# Patient Record
Sex: Female | Born: 1935 | Race: White | Hispanic: No | Smoking: Never smoker
Health system: Southern US, Community
[De-identification: ages and names within clinical notes are randomized; demographics above are authoritative.]

## PROBLEM LIST (undated history)

## (undated) DIAGNOSIS — R001 Bradycardia, unspecified: Secondary | ICD-10-CM

## (undated) DIAGNOSIS — Z7901 Long term (current) use of anticoagulants: Secondary | ICD-10-CM

## (undated) DIAGNOSIS — T50905A Adverse effect of unspecified drugs, medicaments and biological substances, initial encounter: Secondary | ICD-10-CM

## (undated) DIAGNOSIS — F329 Major depressive disorder, single episode, unspecified: Secondary | ICD-10-CM

## (undated) DIAGNOSIS — B690 Cysticercosis of central nervous system: Secondary | ICD-10-CM

## (undated) DIAGNOSIS — I119 Hypertensive heart disease without heart failure: Secondary | ICD-10-CM

## (undated) DIAGNOSIS — R634 Abnormal weight loss: Secondary | ICD-10-CM

## (undated) DIAGNOSIS — F419 Anxiety disorder, unspecified: Secondary | ICD-10-CM

## (undated) DIAGNOSIS — R5383 Other fatigue: Secondary | ICD-10-CM

## (undated) DIAGNOSIS — I672 Cerebral atherosclerosis: Secondary | ICD-10-CM

## (undated) DIAGNOSIS — G459 Transient cerebral ischemic attack, unspecified: Secondary | ICD-10-CM

## (undated) DIAGNOSIS — F22 Delusional disorders: Secondary | ICD-10-CM

## (undated) DIAGNOSIS — R441 Visual hallucinations: Secondary | ICD-10-CM

## (undated) DIAGNOSIS — K5792 Diverticulitis of intestine, part unspecified, without perforation or abscess without bleeding: Secondary | ICD-10-CM

## (undated) DIAGNOSIS — R5381 Other malaise: Secondary | ICD-10-CM

## (undated) DIAGNOSIS — H81399 Other peripheral vertigo, unspecified ear: Secondary | ICD-10-CM

## (undated) DIAGNOSIS — N951 Menopausal and female climacteric states: Secondary | ICD-10-CM

## (undated) DIAGNOSIS — M549 Dorsalgia, unspecified: Secondary | ICD-10-CM

## (undated) DIAGNOSIS — I480 Paroxysmal atrial fibrillation: Secondary | ICD-10-CM

## (undated) DIAGNOSIS — M810 Age-related osteoporosis without current pathological fracture: Secondary | ICD-10-CM

## (undated) DIAGNOSIS — N390 Urinary tract infection, site not specified: Secondary | ICD-10-CM

## (undated) DIAGNOSIS — N952 Postmenopausal atrophic vaginitis: Secondary | ICD-10-CM

## (undated) DIAGNOSIS — M1990 Unspecified osteoarthritis, unspecified site: Secondary | ICD-10-CM

## (undated) DIAGNOSIS — H3530 Unspecified macular degeneration: Secondary | ICD-10-CM

## (undated) DIAGNOSIS — K644 Residual hemorrhoidal skin tags: Secondary | ICD-10-CM

## (undated) DIAGNOSIS — I509 Heart failure, unspecified: Secondary | ICD-10-CM

## (undated) DIAGNOSIS — R0789 Other chest pain: Secondary | ICD-10-CM

## (undated) DIAGNOSIS — K219 Gastro-esophageal reflux disease without esophagitis: Secondary | ICD-10-CM

## (undated) DIAGNOSIS — F0390 Unspecified dementia without behavioral disturbance: Secondary | ICD-10-CM

## (undated) DIAGNOSIS — R32 Unspecified urinary incontinence: Secondary | ICD-10-CM

## (undated) HISTORY — DX: Unspecified urinary incontinence: R32

## (undated) HISTORY — DX: Urinary tract infection, site not specified: N39.0

## (undated) HISTORY — DX: Residual hemorrhoidal skin tags: K64.4

## (undated) HISTORY — DX: Delusional disorders: F22

## (undated) HISTORY — DX: Other peripheral vertigo, unspecified ear: H81.399

## (undated) HISTORY — DX: Other fatigue: R53.83

## (undated) HISTORY — DX: Abnormal weight loss: R63.4

## (undated) HISTORY — DX: Anxiety disorder, unspecified: F41.9

## (undated) HISTORY — PX: JOINT REPLACEMENT: SHX530

## (undated) HISTORY — DX: Other malaise: R53.81

## (undated) HISTORY — PX: CATARACT EXTRACTION, BILATERAL: SHX1313

## (undated) HISTORY — DX: Dorsalgia, unspecified: M54.9

## (undated) HISTORY — DX: Other chest pain: R07.89

## (undated) HISTORY — DX: Menopausal and female climacteric states: N95.1

## (undated) HISTORY — DX: Postmenopausal atrophic vaginitis: N95.2

## (undated) HISTORY — PX: SHOULDER ARTHROSCOPY W/ ROTATOR CUFF REPAIR: SHX2400

## (undated) HISTORY — DX: Age-related osteoporosis without current pathological fracture: M81.0

## (undated) HISTORY — DX: Cerebral atherosclerosis: I67.2

## (undated) HISTORY — DX: Unspecified osteoarthritis, unspecified site: M19.90

## (undated) HISTORY — DX: Bradycardia, unspecified: R00.1

## (undated) HISTORY — DX: Adverse effect of unspecified drugs, medicaments and biological substances, initial encounter: T50.905A

## (undated) HISTORY — DX: Major depressive disorder, single episode, unspecified: F32.9

---

## 1958-12-09 HISTORY — PX: VAGINAL HYSTERECTOMY: SUR661

## 1988-12-09 HISTORY — PX: TOTAL KNEE ARTHROPLASTY: SHX125

## 1998-12-09 HISTORY — PX: BRAIN SURGERY: SHX531

## 1999-09-24 DIAGNOSIS — I672 Cerebral atherosclerosis: Secondary | ICD-10-CM

## 1999-09-24 HISTORY — DX: Cerebral atherosclerosis: I67.2

## 2000-10-07 DIAGNOSIS — H81399 Other peripheral vertigo, unspecified ear: Secondary | ICD-10-CM

## 2000-10-07 HISTORY — DX: Other peripheral vertigo, unspecified ear: H81.399

## 2002-09-06 DIAGNOSIS — F32A Depression, unspecified: Secondary | ICD-10-CM

## 2002-09-06 HISTORY — DX: Depression, unspecified: F32.A

## 2003-01-17 DIAGNOSIS — F419 Anxiety disorder, unspecified: Secondary | ICD-10-CM

## 2003-01-17 HISTORY — DX: Anxiety disorder, unspecified: F41.9

## 2004-05-28 DIAGNOSIS — R634 Abnormal weight loss: Secondary | ICD-10-CM

## 2004-05-28 DIAGNOSIS — M810 Age-related osteoporosis without current pathological fracture: Secondary | ICD-10-CM

## 2004-05-28 HISTORY — DX: Age-related osteoporosis without current pathological fracture: M81.0

## 2004-05-28 HISTORY — DX: Abnormal weight loss: R63.4

## 2005-08-19 DIAGNOSIS — M549 Dorsalgia, unspecified: Secondary | ICD-10-CM

## 2005-08-19 HISTORY — DX: Dorsalgia, unspecified: M54.9

## 2007-08-31 ENCOUNTER — Encounter: Admission: RE | Admit: 2007-08-31 | Discharge: 2007-08-31 | Payer: Self-pay | Admitting: Neurosurgery

## 2007-09-28 DIAGNOSIS — K5792 Diverticulitis of intestine, part unspecified, without perforation or abscess without bleeding: Secondary | ICD-10-CM

## 2007-09-28 HISTORY — DX: Diverticulitis of intestine, part unspecified, without perforation or abscess without bleeding: K57.92

## 2009-04-12 DIAGNOSIS — K644 Residual hemorrhoidal skin tags: Secondary | ICD-10-CM

## 2009-04-12 HISTORY — DX: Residual hemorrhoidal skin tags: K64.4

## 2011-12-10 HISTORY — PX: CATARACT EXTRACTION: SUR2

## 2013-09-21 LAB — LIPID PANEL
Cholesterol: 150 mg/dL (ref 0–200)
HDL: 45 mg/dL (ref 35–70)
LDL Cholesterol: 44 mg/dL
TRIGLYCERIDES: 303 mg/dL — AB (ref 40–160)

## 2013-12-15 DIAGNOSIS — I10 Essential (primary) hypertension: Secondary | ICD-10-CM | POA: Diagnosis not present

## 2013-12-15 DIAGNOSIS — E7800 Pure hypercholesterolemia, unspecified: Secondary | ICD-10-CM | POA: Diagnosis not present

## 2013-12-15 DIAGNOSIS — I4891 Unspecified atrial fibrillation: Secondary | ICD-10-CM | POA: Diagnosis not present

## 2013-12-15 DIAGNOSIS — E785 Hyperlipidemia, unspecified: Secondary | ICD-10-CM | POA: Diagnosis not present

## 2013-12-29 DIAGNOSIS — Z7901 Long term (current) use of anticoagulants: Secondary | ICD-10-CM | POA: Diagnosis not present

## 2013-12-29 DIAGNOSIS — I4891 Unspecified atrial fibrillation: Secondary | ICD-10-CM | POA: Diagnosis not present

## 2013-12-29 DIAGNOSIS — I10 Essential (primary) hypertension: Secondary | ICD-10-CM | POA: Diagnosis not present

## 2014-01-10 DIAGNOSIS — H353190 Nonexudative age-related macular degeneration, unspecified eye, stage unspecified: Secondary | ICD-10-CM | POA: Diagnosis not present

## 2014-03-15 DIAGNOSIS — M159 Polyosteoarthritis, unspecified: Secondary | ICD-10-CM | POA: Diagnosis not present

## 2014-03-15 DIAGNOSIS — I4891 Unspecified atrial fibrillation: Secondary | ICD-10-CM | POA: Diagnosis not present

## 2014-03-15 DIAGNOSIS — Z7901 Long term (current) use of anticoagulants: Secondary | ICD-10-CM | POA: Diagnosis not present

## 2014-03-15 DIAGNOSIS — I10 Essential (primary) hypertension: Secondary | ICD-10-CM | POA: Diagnosis not present

## 2014-03-16 DIAGNOSIS — H10439 Chronic follicular conjunctivitis, unspecified eye: Secondary | ICD-10-CM | POA: Diagnosis not present

## 2014-03-16 DIAGNOSIS — H5200 Hypermetropia, unspecified eye: Secondary | ICD-10-CM | POA: Diagnosis not present

## 2014-03-16 DIAGNOSIS — Z961 Presence of intraocular lens: Secondary | ICD-10-CM | POA: Diagnosis not present

## 2014-03-16 DIAGNOSIS — H353190 Nonexudative age-related macular degeneration, unspecified eye, stage unspecified: Secondary | ICD-10-CM | POA: Diagnosis not present

## 2014-04-04 DIAGNOSIS — Z7901 Long term (current) use of anticoagulants: Secondary | ICD-10-CM | POA: Diagnosis not present

## 2014-04-04 DIAGNOSIS — I471 Supraventricular tachycardia: Secondary | ICD-10-CM | POA: Diagnosis not present

## 2014-04-04 DIAGNOSIS — H539 Unspecified visual disturbance: Secondary | ICD-10-CM | POA: Diagnosis not present

## 2014-04-04 DIAGNOSIS — Z79899 Other long term (current) drug therapy: Secondary | ICD-10-CM | POA: Diagnosis not present

## 2014-04-05 DIAGNOSIS — H3530 Unspecified macular degeneration: Secondary | ICD-10-CM | POA: Diagnosis not present

## 2014-04-05 DIAGNOSIS — Z79899 Other long term (current) drug therapy: Secondary | ICD-10-CM | POA: Diagnosis not present

## 2014-04-05 DIAGNOSIS — I471 Supraventricular tachycardia: Secondary | ICD-10-CM | POA: Diagnosis not present

## 2014-04-05 DIAGNOSIS — Z7901 Long term (current) use of anticoagulants: Secondary | ICD-10-CM | POA: Diagnosis not present

## 2014-04-05 DIAGNOSIS — H539 Unspecified visual disturbance: Secondary | ICD-10-CM | POA: Diagnosis not present

## 2014-04-06 DIAGNOSIS — Z961 Presence of intraocular lens: Secondary | ICD-10-CM | POA: Diagnosis not present

## 2014-04-06 DIAGNOSIS — H353190 Nonexudative age-related macular degeneration, unspecified eye, stage unspecified: Secondary | ICD-10-CM | POA: Diagnosis not present

## 2014-04-06 DIAGNOSIS — H353290 Exudative age-related macular degeneration, unspecified eye, stage unspecified: Secondary | ICD-10-CM | POA: Diagnosis not present

## 2014-04-18 DIAGNOSIS — I10 Essential (primary) hypertension: Secondary | ICD-10-CM | POA: Diagnosis not present

## 2014-04-18 DIAGNOSIS — Z9889 Other specified postprocedural states: Secondary | ICD-10-CM | POA: Diagnosis not present

## 2014-04-18 DIAGNOSIS — H5316 Psychophysical visual disturbances: Secondary | ICD-10-CM | POA: Diagnosis not present

## 2014-04-18 DIAGNOSIS — G459 Transient cerebral ischemic attack, unspecified: Secondary | ICD-10-CM | POA: Diagnosis not present

## 2014-04-22 DIAGNOSIS — H5316 Psychophysical visual disturbances: Secondary | ICD-10-CM | POA: Diagnosis not present

## 2014-04-22 DIAGNOSIS — G459 Transient cerebral ischemic attack, unspecified: Secondary | ICD-10-CM | POA: Diagnosis not present

## 2014-04-22 DIAGNOSIS — R443 Hallucinations, unspecified: Secondary | ICD-10-CM | POA: Diagnosis not present

## 2014-05-09 DIAGNOSIS — H5316 Psychophysical visual disturbances: Secondary | ICD-10-CM | POA: Diagnosis not present

## 2014-05-23 DIAGNOSIS — Z7901 Long term (current) use of anticoagulants: Secondary | ICD-10-CM | POA: Diagnosis not present

## 2014-05-23 DIAGNOSIS — H5316 Psychophysical visual disturbances: Secondary | ICD-10-CM | POA: Diagnosis not present

## 2014-06-07 DIAGNOSIS — H5316 Psychophysical visual disturbances: Secondary | ICD-10-CM | POA: Diagnosis not present

## 2014-06-07 DIAGNOSIS — G40209 Localization-related (focal) (partial) symptomatic epilepsy and epileptic syndromes with complex partial seizures, not intractable, without status epilepticus: Secondary | ICD-10-CM | POA: Diagnosis not present

## 2014-06-07 DIAGNOSIS — I10 Essential (primary) hypertension: Secondary | ICD-10-CM | POA: Diagnosis not present

## 2014-06-28 DIAGNOSIS — G40219 Localization-related (focal) (partial) symptomatic epilepsy and epileptic syndromes with complex partial seizures, intractable, without status epilepticus: Secondary | ICD-10-CM | POA: Diagnosis not present

## 2014-06-29 DIAGNOSIS — H353290 Exudative age-related macular degeneration, unspecified eye, stage unspecified: Secondary | ICD-10-CM | POA: Diagnosis not present

## 2014-07-12 DIAGNOSIS — H353190 Nonexudative age-related macular degeneration, unspecified eye, stage unspecified: Secondary | ICD-10-CM | POA: Diagnosis not present

## 2014-07-12 DIAGNOSIS — H5200 Hypermetropia, unspecified eye: Secondary | ICD-10-CM | POA: Diagnosis not present

## 2014-07-12 DIAGNOSIS — H10439 Chronic follicular conjunctivitis, unspecified eye: Secondary | ICD-10-CM | POA: Diagnosis not present

## 2014-07-12 DIAGNOSIS — Z961 Presence of intraocular lens: Secondary | ICD-10-CM | POA: Diagnosis not present

## 2014-07-15 DIAGNOSIS — Z79899 Other long term (current) drug therapy: Secondary | ICD-10-CM | POA: Diagnosis not present

## 2014-07-15 DIAGNOSIS — H5316 Psychophysical visual disturbances: Secondary | ICD-10-CM | POA: Diagnosis not present

## 2014-08-10 DIAGNOSIS — H5316 Psychophysical visual disturbances: Secondary | ICD-10-CM | POA: Diagnosis not present

## 2014-08-10 DIAGNOSIS — G40219 Localization-related (focal) (partial) symptomatic epilepsy and epileptic syndromes with complex partial seizures, intractable, without status epilepticus: Secondary | ICD-10-CM | POA: Diagnosis not present

## 2014-08-29 DIAGNOSIS — H353190 Nonexudative age-related macular degeneration, unspecified eye, stage unspecified: Secondary | ICD-10-CM | POA: Diagnosis not present

## 2014-09-21 DIAGNOSIS — R441 Visual hallucinations: Secondary | ICD-10-CM | POA: Diagnosis not present

## 2014-09-23 DIAGNOSIS — Z23 Encounter for immunization: Secondary | ICD-10-CM | POA: Diagnosis not present

## 2014-10-05 DIAGNOSIS — Z961 Presence of intraocular lens: Secondary | ICD-10-CM | POA: Diagnosis not present

## 2014-10-05 DIAGNOSIS — H3531 Nonexudative age-related macular degeneration: Secondary | ICD-10-CM | POA: Diagnosis not present

## 2014-10-05 DIAGNOSIS — H04123 Dry eye syndrome of bilateral lacrimal glands: Secondary | ICD-10-CM | POA: Diagnosis not present

## 2014-10-05 DIAGNOSIS — H3532 Exudative age-related macular degeneration: Secondary | ICD-10-CM | POA: Diagnosis not present

## 2014-10-06 DIAGNOSIS — R441 Visual hallucinations: Secondary | ICD-10-CM | POA: Diagnosis not present

## 2014-10-19 DIAGNOSIS — J069 Acute upper respiratory infection, unspecified: Secondary | ICD-10-CM | POA: Diagnosis not present

## 2014-10-19 DIAGNOSIS — I10 Essential (primary) hypertension: Secondary | ICD-10-CM | POA: Diagnosis not present

## 2014-11-02 DIAGNOSIS — H3532 Exudative age-related macular degeneration: Secondary | ICD-10-CM | POA: Diagnosis not present

## 2014-11-02 DIAGNOSIS — Z961 Presence of intraocular lens: Secondary | ICD-10-CM | POA: Diagnosis not present

## 2014-11-29 DIAGNOSIS — Z1231 Encounter for screening mammogram for malignant neoplasm of breast: Secondary | ICD-10-CM | POA: Diagnosis not present

## 2014-12-06 DIAGNOSIS — D692 Other nonthrombocytopenic purpura: Secondary | ICD-10-CM | POA: Diagnosis not present

## 2014-12-06 DIAGNOSIS — L821 Other seborrheic keratosis: Secondary | ICD-10-CM | POA: Diagnosis not present

## 2014-12-06 DIAGNOSIS — L820 Inflamed seborrheic keratosis: Secondary | ICD-10-CM | POA: Diagnosis not present

## 2014-12-06 DIAGNOSIS — D229 Melanocytic nevi, unspecified: Secondary | ICD-10-CM | POA: Diagnosis not present

## 2014-12-06 DIAGNOSIS — L905 Scar conditions and fibrosis of skin: Secondary | ICD-10-CM | POA: Diagnosis not present

## 2014-12-07 DIAGNOSIS — H3532 Exudative age-related macular degeneration: Secondary | ICD-10-CM | POA: Diagnosis not present

## 2014-12-23 DIAGNOSIS — H3530 Unspecified macular degeneration: Secondary | ICD-10-CM | POA: Diagnosis not present

## 2014-12-23 DIAGNOSIS — I10 Essential (primary) hypertension: Secondary | ICD-10-CM | POA: Diagnosis not present

## 2014-12-23 DIAGNOSIS — Z23 Encounter for immunization: Secondary | ICD-10-CM | POA: Diagnosis not present

## 2014-12-23 DIAGNOSIS — R441 Visual hallucinations: Secondary | ICD-10-CM | POA: Diagnosis not present

## 2015-01-04 DIAGNOSIS — H3532 Exudative age-related macular degeneration: Secondary | ICD-10-CM | POA: Diagnosis not present

## 2015-01-30 DIAGNOSIS — R441 Visual hallucinations: Secondary | ICD-10-CM | POA: Diagnosis not present

## 2015-02-01 DIAGNOSIS — H3532 Exudative age-related macular degeneration: Secondary | ICD-10-CM | POA: Diagnosis not present

## 2015-02-15 DIAGNOSIS — R0789 Other chest pain: Secondary | ICD-10-CM | POA: Diagnosis not present

## 2015-02-15 DIAGNOSIS — W19XXXA Unspecified fall, initial encounter: Secondary | ICD-10-CM | POA: Diagnosis not present

## 2015-03-02 DIAGNOSIS — R441 Visual hallucinations: Secondary | ICD-10-CM | POA: Diagnosis not present

## 2015-03-08 DIAGNOSIS — Z961 Presence of intraocular lens: Secondary | ICD-10-CM | POA: Diagnosis not present

## 2015-03-08 DIAGNOSIS — H3532 Exudative age-related macular degeneration: Secondary | ICD-10-CM | POA: Diagnosis not present

## 2015-03-15 DIAGNOSIS — Z79899 Other long term (current) drug therapy: Secondary | ICD-10-CM | POA: Diagnosis not present

## 2015-03-15 DIAGNOSIS — I10 Essential (primary) hypertension: Secondary | ICD-10-CM | POA: Diagnosis not present

## 2015-03-15 DIAGNOSIS — R441 Visual hallucinations: Secondary | ICD-10-CM | POA: Diagnosis not present

## 2015-03-15 DIAGNOSIS — I471 Supraventricular tachycardia: Secondary | ICD-10-CM | POA: Diagnosis not present

## 2015-03-15 DIAGNOSIS — E785 Hyperlipidemia, unspecified: Secondary | ICD-10-CM | POA: Diagnosis not present

## 2015-03-17 DIAGNOSIS — H3532 Exudative age-related macular degeneration: Secondary | ICD-10-CM | POA: Diagnosis not present

## 2015-03-29 DIAGNOSIS — R441 Visual hallucinations: Secondary | ICD-10-CM | POA: Diagnosis not present

## 2015-03-29 DIAGNOSIS — I10 Essential (primary) hypertension: Secondary | ICD-10-CM | POA: Diagnosis not present

## 2015-03-29 DIAGNOSIS — M25551 Pain in right hip: Secondary | ICD-10-CM | POA: Diagnosis not present

## 2015-04-05 DIAGNOSIS — H3532 Exudative age-related macular degeneration: Secondary | ICD-10-CM | POA: Diagnosis not present

## 2015-04-17 DIAGNOSIS — M7061 Trochanteric bursitis, right hip: Secondary | ICD-10-CM | POA: Diagnosis not present

## 2015-05-03 DIAGNOSIS — H3532 Exudative age-related macular degeneration: Secondary | ICD-10-CM | POA: Diagnosis not present

## 2015-05-31 DIAGNOSIS — M7071 Other bursitis of hip, right hip: Secondary | ICD-10-CM | POA: Diagnosis not present

## 2015-05-31 DIAGNOSIS — I10 Essential (primary) hypertension: Secondary | ICD-10-CM | POA: Diagnosis not present

## 2015-05-31 DIAGNOSIS — R441 Visual hallucinations: Secondary | ICD-10-CM | POA: Diagnosis not present

## 2015-06-07 DIAGNOSIS — Z961 Presence of intraocular lens: Secondary | ICD-10-CM | POA: Diagnosis not present

## 2015-06-07 DIAGNOSIS — H3532 Exudative age-related macular degeneration: Secondary | ICD-10-CM | POA: Diagnosis not present

## 2015-07-05 DIAGNOSIS — H3532 Exudative age-related macular degeneration: Secondary | ICD-10-CM | POA: Diagnosis not present

## 2015-07-12 DIAGNOSIS — E780 Pure hypercholesterolemia: Secondary | ICD-10-CM | POA: Diagnosis not present

## 2015-07-12 DIAGNOSIS — F418 Other specified anxiety disorders: Secondary | ICD-10-CM | POA: Diagnosis not present

## 2015-07-12 DIAGNOSIS — Z79899 Other long term (current) drug therapy: Secondary | ICD-10-CM | POA: Diagnosis not present

## 2015-07-12 DIAGNOSIS — R441 Visual hallucinations: Secondary | ICD-10-CM | POA: Diagnosis not present

## 2015-07-12 DIAGNOSIS — I10 Essential (primary) hypertension: Secondary | ICD-10-CM | POA: Diagnosis not present

## 2015-08-02 DIAGNOSIS — Z961 Presence of intraocular lens: Secondary | ICD-10-CM | POA: Diagnosis not present

## 2015-08-02 DIAGNOSIS — H3532 Exudative age-related macular degeneration: Secondary | ICD-10-CM | POA: Diagnosis not present

## 2015-08-13 DIAGNOSIS — Z8673 Personal history of transient ischemic attack (TIA), and cerebral infarction without residual deficits: Secondary | ICD-10-CM | POA: Diagnosis not present

## 2015-08-13 DIAGNOSIS — R0789 Other chest pain: Secondary | ICD-10-CM | POA: Diagnosis not present

## 2015-08-13 DIAGNOSIS — Z90710 Acquired absence of both cervix and uterus: Secondary | ICD-10-CM | POA: Diagnosis not present

## 2015-08-13 DIAGNOSIS — M1990 Unspecified osteoarthritis, unspecified site: Secondary | ICD-10-CM | POA: Diagnosis not present

## 2015-08-13 DIAGNOSIS — I4891 Unspecified atrial fibrillation: Secondary | ICD-10-CM | POA: Diagnosis not present

## 2015-08-13 DIAGNOSIS — Z9049 Acquired absence of other specified parts of digestive tract: Secondary | ICD-10-CM | POA: Diagnosis not present

## 2015-08-13 DIAGNOSIS — I10 Essential (primary) hypertension: Secondary | ICD-10-CM | POA: Diagnosis not present

## 2015-08-13 DIAGNOSIS — F419 Anxiety disorder, unspecified: Secondary | ICD-10-CM | POA: Diagnosis not present

## 2015-08-13 DIAGNOSIS — K219 Gastro-esophageal reflux disease without esophagitis: Secondary | ICD-10-CM | POA: Diagnosis not present

## 2015-08-13 DIAGNOSIS — Z888 Allergy status to other drugs, medicaments and biological substances status: Secondary | ICD-10-CM | POA: Diagnosis not present

## 2015-08-13 DIAGNOSIS — E785 Hyperlipidemia, unspecified: Secondary | ICD-10-CM | POA: Diagnosis not present

## 2015-08-13 DIAGNOSIS — K5790 Diverticulosis of intestine, part unspecified, without perforation or abscess without bleeding: Secondary | ICD-10-CM | POA: Diagnosis not present

## 2015-08-13 DIAGNOSIS — Z881 Allergy status to other antibiotic agents status: Secondary | ICD-10-CM | POA: Diagnosis not present

## 2015-08-13 DIAGNOSIS — R079 Chest pain, unspecified: Secondary | ICD-10-CM | POA: Diagnosis not present

## 2015-08-13 DIAGNOSIS — F329 Major depressive disorder, single episode, unspecified: Secondary | ICD-10-CM | POA: Diagnosis not present

## 2015-08-14 DIAGNOSIS — R079 Chest pain, unspecified: Secondary | ICD-10-CM | POA: Diagnosis not present

## 2015-08-14 DIAGNOSIS — I4891 Unspecified atrial fibrillation: Secondary | ICD-10-CM | POA: Diagnosis not present

## 2015-08-17 DIAGNOSIS — H903 Sensorineural hearing loss, bilateral: Secondary | ICD-10-CM | POA: Diagnosis not present

## 2015-08-18 DIAGNOSIS — F419 Anxiety disorder, unspecified: Secondary | ICD-10-CM | POA: Diagnosis present

## 2015-08-18 DIAGNOSIS — R001 Bradycardia, unspecified: Secondary | ICD-10-CM | POA: Diagnosis not present

## 2015-08-18 DIAGNOSIS — Z9181 History of falling: Secondary | ICD-10-CM | POA: Diagnosis not present

## 2015-08-18 DIAGNOSIS — R079 Chest pain, unspecified: Secondary | ICD-10-CM | POA: Diagnosis not present

## 2015-08-18 DIAGNOSIS — Z01810 Encounter for preprocedural cardiovascular examination: Secondary | ICD-10-CM | POA: Diagnosis not present

## 2015-08-18 DIAGNOSIS — Z452 Encounter for adjustment and management of vascular access device: Secondary | ICD-10-CM | POA: Diagnosis not present

## 2015-08-18 DIAGNOSIS — M1990 Unspecified osteoarthritis, unspecified site: Secondary | ICD-10-CM | POA: Diagnosis present

## 2015-08-18 DIAGNOSIS — Z880 Allergy status to penicillin: Secondary | ICD-10-CM | POA: Diagnosis not present

## 2015-08-18 DIAGNOSIS — R0789 Other chest pain: Secondary | ICD-10-CM | POA: Diagnosis not present

## 2015-08-18 DIAGNOSIS — E876 Hypokalemia: Secondary | ICD-10-CM | POA: Diagnosis not present

## 2015-08-18 DIAGNOSIS — I10 Essential (primary) hypertension: Secondary | ICD-10-CM | POA: Diagnosis not present

## 2015-08-18 DIAGNOSIS — I480 Paroxysmal atrial fibrillation: Secondary | ICD-10-CM | POA: Diagnosis not present

## 2015-08-18 DIAGNOSIS — I350 Nonrheumatic aortic (valve) stenosis: Secondary | ICD-10-CM | POA: Diagnosis not present

## 2015-08-18 DIAGNOSIS — I083 Combined rheumatic disorders of mitral, aortic and tricuspid valves: Secondary | ICD-10-CM | POA: Diagnosis present

## 2015-08-18 DIAGNOSIS — Z8673 Personal history of transient ischemic attack (TIA), and cerebral infarction without residual deficits: Secondary | ICD-10-CM | POA: Diagnosis not present

## 2015-08-18 DIAGNOSIS — I4891 Unspecified atrial fibrillation: Secondary | ICD-10-CM | POA: Diagnosis not present

## 2015-08-18 DIAGNOSIS — E785 Hyperlipidemia, unspecified: Secondary | ICD-10-CM | POA: Diagnosis present

## 2015-08-18 DIAGNOSIS — Z885 Allergy status to narcotic agent status: Secondary | ICD-10-CM | POA: Diagnosis not present

## 2015-08-18 DIAGNOSIS — K219 Gastro-esophageal reflux disease without esophagitis: Secondary | ICD-10-CM | POA: Diagnosis present

## 2015-08-18 DIAGNOSIS — I272 Other secondary pulmonary hypertension: Secondary | ICD-10-CM | POA: Diagnosis present

## 2015-08-23 DIAGNOSIS — F419 Anxiety disorder, unspecified: Secondary | ICD-10-CM | POA: Diagnosis not present

## 2015-08-23 DIAGNOSIS — R5381 Other malaise: Secondary | ICD-10-CM | POA: Diagnosis not present

## 2015-08-23 DIAGNOSIS — I10 Essential (primary) hypertension: Secondary | ICD-10-CM | POA: Diagnosis not present

## 2015-08-23 DIAGNOSIS — I272 Other secondary pulmonary hypertension: Secondary | ICD-10-CM | POA: Diagnosis not present

## 2015-08-23 DIAGNOSIS — I4891 Unspecified atrial fibrillation: Secondary | ICD-10-CM | POA: Diagnosis not present

## 2015-08-23 DIAGNOSIS — K219 Gastro-esophageal reflux disease without esophagitis: Secondary | ICD-10-CM | POA: Diagnosis not present

## 2015-08-24 DIAGNOSIS — I272 Other secondary pulmonary hypertension: Secondary | ICD-10-CM | POA: Diagnosis not present

## 2015-08-24 DIAGNOSIS — I10 Essential (primary) hypertension: Secondary | ICD-10-CM | POA: Diagnosis not present

## 2015-08-24 DIAGNOSIS — K219 Gastro-esophageal reflux disease without esophagitis: Secondary | ICD-10-CM | POA: Diagnosis not present

## 2015-08-24 DIAGNOSIS — I4891 Unspecified atrial fibrillation: Secondary | ICD-10-CM | POA: Diagnosis not present

## 2015-08-24 DIAGNOSIS — F419 Anxiety disorder, unspecified: Secondary | ICD-10-CM | POA: Diagnosis not present

## 2015-08-24 DIAGNOSIS — R5381 Other malaise: Secondary | ICD-10-CM | POA: Diagnosis not present

## 2015-08-25 DIAGNOSIS — I10 Essential (primary) hypertension: Secondary | ICD-10-CM | POA: Diagnosis not present

## 2015-08-25 DIAGNOSIS — I4891 Unspecified atrial fibrillation: Secondary | ICD-10-CM | POA: Diagnosis not present

## 2015-08-25 DIAGNOSIS — F419 Anxiety disorder, unspecified: Secondary | ICD-10-CM | POA: Diagnosis not present

## 2015-08-25 DIAGNOSIS — I272 Other secondary pulmonary hypertension: Secondary | ICD-10-CM | POA: Diagnosis not present

## 2015-08-25 DIAGNOSIS — K219 Gastro-esophageal reflux disease without esophagitis: Secondary | ICD-10-CM | POA: Diagnosis not present

## 2015-08-25 DIAGNOSIS — R5381 Other malaise: Secondary | ICD-10-CM | POA: Diagnosis not present

## 2015-08-28 DIAGNOSIS — I10 Essential (primary) hypertension: Secondary | ICD-10-CM | POA: Diagnosis not present

## 2015-08-28 DIAGNOSIS — F419 Anxiety disorder, unspecified: Secondary | ICD-10-CM | POA: Diagnosis not present

## 2015-08-28 DIAGNOSIS — I4891 Unspecified atrial fibrillation: Secondary | ICD-10-CM | POA: Diagnosis not present

## 2015-08-28 DIAGNOSIS — K219 Gastro-esophageal reflux disease without esophagitis: Secondary | ICD-10-CM | POA: Diagnosis not present

## 2015-08-28 DIAGNOSIS — I272 Other secondary pulmonary hypertension: Secondary | ICD-10-CM | POA: Diagnosis not present

## 2015-08-28 DIAGNOSIS — R5381 Other malaise: Secondary | ICD-10-CM | POA: Diagnosis not present

## 2015-08-29 DIAGNOSIS — I10 Essential (primary) hypertension: Secondary | ICD-10-CM | POA: Diagnosis not present

## 2015-08-29 DIAGNOSIS — R5381 Other malaise: Secondary | ICD-10-CM | POA: Diagnosis not present

## 2015-08-29 DIAGNOSIS — I4891 Unspecified atrial fibrillation: Secondary | ICD-10-CM | POA: Diagnosis not present

## 2015-08-29 DIAGNOSIS — F419 Anxiety disorder, unspecified: Secondary | ICD-10-CM | POA: Diagnosis not present

## 2015-08-29 DIAGNOSIS — K219 Gastro-esophageal reflux disease without esophagitis: Secondary | ICD-10-CM | POA: Diagnosis not present

## 2015-08-29 DIAGNOSIS — I272 Other secondary pulmonary hypertension: Secondary | ICD-10-CM | POA: Diagnosis not present

## 2015-08-30 DIAGNOSIS — I480 Paroxysmal atrial fibrillation: Secondary | ICD-10-CM | POA: Diagnosis not present

## 2015-08-30 DIAGNOSIS — I495 Sick sinus syndrome: Secondary | ICD-10-CM | POA: Diagnosis not present

## 2015-08-30 DIAGNOSIS — Z23 Encounter for immunization: Secondary | ICD-10-CM | POA: Diagnosis not present

## 2015-08-30 DIAGNOSIS — I10 Essential (primary) hypertension: Secondary | ICD-10-CM | POA: Diagnosis not present

## 2015-08-30 DIAGNOSIS — Z79899 Other long term (current) drug therapy: Secondary | ICD-10-CM | POA: Diagnosis not present

## 2015-08-31 DIAGNOSIS — R5381 Other malaise: Secondary | ICD-10-CM | POA: Diagnosis not present

## 2015-08-31 DIAGNOSIS — I4891 Unspecified atrial fibrillation: Secondary | ICD-10-CM | POA: Diagnosis not present

## 2015-08-31 DIAGNOSIS — I10 Essential (primary) hypertension: Secondary | ICD-10-CM | POA: Diagnosis not present

## 2015-08-31 DIAGNOSIS — I272 Other secondary pulmonary hypertension: Secondary | ICD-10-CM | POA: Diagnosis not present

## 2015-08-31 DIAGNOSIS — F419 Anxiety disorder, unspecified: Secondary | ICD-10-CM | POA: Diagnosis not present

## 2015-08-31 DIAGNOSIS — K219 Gastro-esophageal reflux disease without esophagitis: Secondary | ICD-10-CM | POA: Diagnosis not present

## 2015-09-04 DIAGNOSIS — I10 Essential (primary) hypertension: Secondary | ICD-10-CM | POA: Diagnosis not present

## 2015-09-04 DIAGNOSIS — I272 Other secondary pulmonary hypertension: Secondary | ICD-10-CM | POA: Diagnosis not present

## 2015-09-04 DIAGNOSIS — F419 Anxiety disorder, unspecified: Secondary | ICD-10-CM | POA: Diagnosis not present

## 2015-09-04 DIAGNOSIS — I4891 Unspecified atrial fibrillation: Secondary | ICD-10-CM | POA: Diagnosis not present

## 2015-09-04 DIAGNOSIS — K219 Gastro-esophageal reflux disease without esophagitis: Secondary | ICD-10-CM | POA: Diagnosis not present

## 2015-09-04 DIAGNOSIS — R5381 Other malaise: Secondary | ICD-10-CM | POA: Diagnosis not present

## 2015-09-05 DIAGNOSIS — I272 Other secondary pulmonary hypertension: Secondary | ICD-10-CM | POA: Diagnosis not present

## 2015-09-05 DIAGNOSIS — R5381 Other malaise: Secondary | ICD-10-CM | POA: Diagnosis not present

## 2015-09-05 DIAGNOSIS — F419 Anxiety disorder, unspecified: Secondary | ICD-10-CM | POA: Diagnosis not present

## 2015-09-05 DIAGNOSIS — I10 Essential (primary) hypertension: Secondary | ICD-10-CM | POA: Diagnosis not present

## 2015-09-05 DIAGNOSIS — K219 Gastro-esophageal reflux disease without esophagitis: Secondary | ICD-10-CM | POA: Diagnosis not present

## 2015-09-05 DIAGNOSIS — I4891 Unspecified atrial fibrillation: Secondary | ICD-10-CM | POA: Diagnosis not present

## 2015-09-06 DIAGNOSIS — I4891 Unspecified atrial fibrillation: Secondary | ICD-10-CM | POA: Diagnosis not present

## 2015-09-06 DIAGNOSIS — R5381 Other malaise: Secondary | ICD-10-CM | POA: Diagnosis not present

## 2015-09-06 DIAGNOSIS — H3532 Exudative age-related macular degeneration: Secondary | ICD-10-CM | POA: Diagnosis not present

## 2015-09-06 DIAGNOSIS — I10 Essential (primary) hypertension: Secondary | ICD-10-CM | POA: Diagnosis not present

## 2015-09-06 DIAGNOSIS — F419 Anxiety disorder, unspecified: Secondary | ICD-10-CM | POA: Diagnosis not present

## 2015-09-06 DIAGNOSIS — Z961 Presence of intraocular lens: Secondary | ICD-10-CM | POA: Diagnosis not present

## 2015-09-06 DIAGNOSIS — I272 Other secondary pulmonary hypertension: Secondary | ICD-10-CM | POA: Diagnosis not present

## 2015-09-06 DIAGNOSIS — K219 Gastro-esophageal reflux disease without esophagitis: Secondary | ICD-10-CM | POA: Diagnosis not present

## 2015-09-08 DIAGNOSIS — I10 Essential (primary) hypertension: Secondary | ICD-10-CM | POA: Diagnosis not present

## 2015-09-08 DIAGNOSIS — R5381 Other malaise: Secondary | ICD-10-CM | POA: Diagnosis not present

## 2015-09-08 DIAGNOSIS — K219 Gastro-esophageal reflux disease without esophagitis: Secondary | ICD-10-CM | POA: Diagnosis not present

## 2015-09-08 DIAGNOSIS — I4891 Unspecified atrial fibrillation: Secondary | ICD-10-CM | POA: Diagnosis not present

## 2015-09-08 DIAGNOSIS — F419 Anxiety disorder, unspecified: Secondary | ICD-10-CM | POA: Diagnosis not present

## 2015-09-08 DIAGNOSIS — I272 Other secondary pulmonary hypertension: Secondary | ICD-10-CM | POA: Diagnosis not present

## 2015-09-11 DIAGNOSIS — R5381 Other malaise: Secondary | ICD-10-CM | POA: Diagnosis not present

## 2015-09-11 DIAGNOSIS — K219 Gastro-esophageal reflux disease without esophagitis: Secondary | ICD-10-CM | POA: Diagnosis not present

## 2015-09-11 DIAGNOSIS — F419 Anxiety disorder, unspecified: Secondary | ICD-10-CM | POA: Diagnosis not present

## 2015-09-11 DIAGNOSIS — I10 Essential (primary) hypertension: Secondary | ICD-10-CM | POA: Diagnosis not present

## 2015-09-11 DIAGNOSIS — I272 Other secondary pulmonary hypertension: Secondary | ICD-10-CM | POA: Diagnosis not present

## 2015-09-11 DIAGNOSIS — I4891 Unspecified atrial fibrillation: Secondary | ICD-10-CM | POA: Diagnosis not present

## 2015-09-12 DIAGNOSIS — K219 Gastro-esophageal reflux disease without esophagitis: Secondary | ICD-10-CM | POA: Diagnosis not present

## 2015-09-12 DIAGNOSIS — F419 Anxiety disorder, unspecified: Secondary | ICD-10-CM | POA: Diagnosis not present

## 2015-09-12 DIAGNOSIS — R5381 Other malaise: Secondary | ICD-10-CM | POA: Diagnosis not present

## 2015-09-12 DIAGNOSIS — I10 Essential (primary) hypertension: Secondary | ICD-10-CM | POA: Diagnosis not present

## 2015-09-12 DIAGNOSIS — I272 Other secondary pulmonary hypertension: Secondary | ICD-10-CM | POA: Diagnosis not present

## 2015-09-12 DIAGNOSIS — I4891 Unspecified atrial fibrillation: Secondary | ICD-10-CM | POA: Diagnosis not present

## 2015-10-02 DIAGNOSIS — Z79899 Other long term (current) drug therapy: Secondary | ICD-10-CM | POA: Diagnosis not present

## 2015-10-02 DIAGNOSIS — R441 Visual hallucinations: Secondary | ICD-10-CM | POA: Diagnosis not present

## 2015-10-02 DIAGNOSIS — I2510 Atherosclerotic heart disease of native coronary artery without angina pectoris: Secondary | ICD-10-CM | POA: Diagnosis not present

## 2015-10-02 DIAGNOSIS — I340 Nonrheumatic mitral (valve) insufficiency: Secondary | ICD-10-CM | POA: Diagnosis not present

## 2015-10-02 DIAGNOSIS — I480 Paroxysmal atrial fibrillation: Secondary | ICD-10-CM | POA: Diagnosis not present

## 2015-10-02 DIAGNOSIS — I351 Nonrheumatic aortic (valve) insufficiency: Secondary | ICD-10-CM | POA: Diagnosis not present

## 2015-10-02 DIAGNOSIS — I272 Other secondary pulmonary hypertension: Secondary | ICD-10-CM | POA: Diagnosis not present

## 2015-10-02 DIAGNOSIS — R001 Bradycardia, unspecified: Secondary | ICD-10-CM | POA: Diagnosis not present

## 2015-10-02 DIAGNOSIS — I10 Essential (primary) hypertension: Secondary | ICD-10-CM | POA: Diagnosis not present

## 2015-10-02 DIAGNOSIS — T50905A Adverse effect of unspecified drugs, medicaments and biological substances, initial encounter: Secondary | ICD-10-CM | POA: Diagnosis not present

## 2015-10-02 DIAGNOSIS — I4891 Unspecified atrial fibrillation: Secondary | ICD-10-CM | POA: Diagnosis not present

## 2015-10-04 DIAGNOSIS — H353213 Exudative age-related macular degeneration, right eye, with inactive scar: Secondary | ICD-10-CM | POA: Diagnosis not present

## 2015-10-04 DIAGNOSIS — H353222 Exudative age-related macular degeneration, left eye, with inactive choroidal neovascularization: Secondary | ICD-10-CM | POA: Diagnosis not present

## 2015-10-04 DIAGNOSIS — Z961 Presence of intraocular lens: Secondary | ICD-10-CM | POA: Diagnosis not present

## 2015-10-12 DIAGNOSIS — I340 Nonrheumatic mitral (valve) insufficiency: Secondary | ICD-10-CM | POA: Diagnosis not present

## 2015-10-12 DIAGNOSIS — I4891 Unspecified atrial fibrillation: Secondary | ICD-10-CM | POA: Diagnosis not present

## 2015-10-12 DIAGNOSIS — I10 Essential (primary) hypertension: Secondary | ICD-10-CM | POA: Diagnosis not present

## 2015-11-08 DIAGNOSIS — Z961 Presence of intraocular lens: Secondary | ICD-10-CM | POA: Diagnosis not present

## 2015-11-08 DIAGNOSIS — H353222 Exudative age-related macular degeneration, left eye, with inactive choroidal neovascularization: Secondary | ICD-10-CM | POA: Diagnosis not present

## 2015-11-08 DIAGNOSIS — H353213 Exudative age-related macular degeneration, right eye, with inactive scar: Secondary | ICD-10-CM | POA: Diagnosis not present

## 2015-11-10 DIAGNOSIS — R443 Hallucinations, unspecified: Secondary | ICD-10-CM | POA: Diagnosis not present

## 2015-11-10 DIAGNOSIS — Z880 Allergy status to penicillin: Secondary | ICD-10-CM | POA: Diagnosis not present

## 2015-11-10 DIAGNOSIS — I4891 Unspecified atrial fibrillation: Secondary | ICD-10-CM | POA: Diagnosis not present

## 2015-11-10 DIAGNOSIS — Z7982 Long term (current) use of aspirin: Secondary | ICD-10-CM | POA: Diagnosis not present

## 2015-11-10 DIAGNOSIS — R410 Disorientation, unspecified: Secondary | ICD-10-CM | POA: Diagnosis not present

## 2015-11-10 DIAGNOSIS — F0390 Unspecified dementia without behavioral disturbance: Secondary | ICD-10-CM | POA: Diagnosis not present

## 2015-11-10 DIAGNOSIS — Z888 Allergy status to other drugs, medicaments and biological substances status: Secondary | ICD-10-CM | POA: Diagnosis not present

## 2015-11-10 DIAGNOSIS — H3530 Unspecified macular degeneration: Secondary | ICD-10-CM | POA: Diagnosis not present

## 2015-11-28 DIAGNOSIS — I4891 Unspecified atrial fibrillation: Secondary | ICD-10-CM | POA: Diagnosis not present

## 2015-11-28 DIAGNOSIS — I272 Other secondary pulmonary hypertension: Secondary | ICD-10-CM | POA: Diagnosis not present

## 2015-11-28 DIAGNOSIS — I10 Essential (primary) hypertension: Secondary | ICD-10-CM | POA: Diagnosis not present

## 2015-11-28 DIAGNOSIS — I351 Nonrheumatic aortic (valve) insufficiency: Secondary | ICD-10-CM | POA: Diagnosis not present

## 2015-11-28 DIAGNOSIS — I340 Nonrheumatic mitral (valve) insufficiency: Secondary | ICD-10-CM | POA: Diagnosis not present

## 2015-11-28 DIAGNOSIS — I2510 Atherosclerotic heart disease of native coronary artery without angina pectoris: Secondary | ICD-10-CM | POA: Diagnosis not present

## 2015-11-28 DIAGNOSIS — R001 Bradycardia, unspecified: Secondary | ICD-10-CM | POA: Diagnosis not present

## 2015-11-29 DIAGNOSIS — F0390 Unspecified dementia without behavioral disturbance: Secondary | ICD-10-CM | POA: Diagnosis not present

## 2015-11-29 DIAGNOSIS — R441 Visual hallucinations: Secondary | ICD-10-CM | POA: Diagnosis not present

## 2015-12-13 DIAGNOSIS — H353222 Exudative age-related macular degeneration, left eye, with inactive choroidal neovascularization: Secondary | ICD-10-CM | POA: Diagnosis not present

## 2015-12-13 DIAGNOSIS — H353213 Exudative age-related macular degeneration, right eye, with inactive scar: Secondary | ICD-10-CM | POA: Diagnosis not present

## 2015-12-13 DIAGNOSIS — Z961 Presence of intraocular lens: Secondary | ICD-10-CM | POA: Diagnosis not present

## 2015-12-14 ENCOUNTER — Telehealth: Payer: Self-pay | Admitting: Cardiovascular Disease

## 2015-12-14 NOTE — Telephone Encounter (Signed)
Received records from [facility] for appointment on 01/12/16 with Dr Sallyanne Kuster.  Records given to [facility] (medical records) for Dr Croitoru's schedule on 01/12/16. lp

## 2015-12-20 ENCOUNTER — Telehealth: Payer: Self-pay | Admitting: Cardiovascular Disease

## 2015-12-20 DIAGNOSIS — R441 Visual hallucinations: Secondary | ICD-10-CM | POA: Diagnosis not present

## 2015-12-20 DIAGNOSIS — F0391 Unspecified dementia with behavioral disturbance: Secondary | ICD-10-CM | POA: Diagnosis not present

## 2015-12-20 NOTE — Telephone Encounter (Signed)
Received records from [facility] for appointment with Dr Sallyanne Kuster on 01/12/16.  Records given to  [facility] (medical records) for Dr Croitoru's schedule on 01/12/16.

## 2015-12-25 ENCOUNTER — Emergency Department (HOSPITAL_COMMUNITY)
Admission: EM | Admit: 2015-12-25 | Discharge: 2015-12-25 | Disposition: A | Discharge status: Home / Self Care | Payer: Medicare Other | Source: Home / Self Care | Attending: Emergency Medicine | Admitting: Emergency Medicine

## 2015-12-25 ENCOUNTER — Encounter (HOSPITAL_COMMUNITY): Payer: Self-pay

## 2015-12-25 ENCOUNTER — Emergency Department (HOSPITAL_COMMUNITY): Payer: Medicare Other

## 2015-12-25 DIAGNOSIS — I248 Other forms of acute ischemic heart disease: Secondary | ICD-10-CM | POA: Diagnosis not present

## 2015-12-25 DIAGNOSIS — I10 Essential (primary) hypertension: Secondary | ICD-10-CM | POA: Insufficient documentation

## 2015-12-25 DIAGNOSIS — R4182 Altered mental status, unspecified: Secondary | ICD-10-CM | POA: Diagnosis not present

## 2015-12-25 DIAGNOSIS — Z880 Allergy status to penicillin: Secondary | ICD-10-CM

## 2015-12-25 DIAGNOSIS — R079 Chest pain, unspecified: Secondary | ICD-10-CM | POA: Insufficient documentation

## 2015-12-25 DIAGNOSIS — J439 Emphysema, unspecified: Secondary | ICD-10-CM | POA: Diagnosis not present

## 2015-12-25 DIAGNOSIS — I4891 Unspecified atrial fibrillation: Secondary | ICD-10-CM | POA: Diagnosis not present

## 2015-12-25 DIAGNOSIS — N390 Urinary tract infection, site not specified: Secondary | ICD-10-CM | POA: Diagnosis not present

## 2015-12-25 DIAGNOSIS — N12 Tubulo-interstitial nephritis, not specified as acute or chronic: Secondary | ICD-10-CM | POA: Diagnosis not present

## 2015-12-25 DIAGNOSIS — F0390 Unspecified dementia without behavioral disturbance: Secondary | ICD-10-CM | POA: Diagnosis not present

## 2015-12-25 DIAGNOSIS — R440 Auditory hallucinations: Secondary | ICD-10-CM | POA: Diagnosis not present

## 2015-12-25 DIAGNOSIS — B9689 Other specified bacterial agents as the cause of diseases classified elsewhere: Secondary | ICD-10-CM | POA: Diagnosis not present

## 2015-12-25 DIAGNOSIS — R072 Precordial pain: Secondary | ICD-10-CM | POA: Diagnosis not present

## 2015-12-25 HISTORY — DX: Paroxysmal atrial fibrillation: I48.0

## 2015-12-25 LAB — CBC
HEMATOCRIT: 36.1 % (ref 36.0–46.0)
HEMOGLOBIN: 11.7 g/dL — AB (ref 12.0–15.0)
MCH: 28.5 pg (ref 26.0–34.0)
MCHC: 32.4 g/dL (ref 30.0–36.0)
MCV: 88 fL (ref 78.0–100.0)
Platelets: 230 10*3/uL (ref 150–400)
RBC: 4.1 MIL/uL (ref 3.87–5.11)
RDW: 14.1 % (ref 11.5–15.5)
WBC: 5.2 10*3/uL (ref 4.0–10.5)

## 2015-12-25 LAB — TROPONIN I: Troponin I: <0.03 ng/mL (ref <0.031)

## 2015-12-25 LAB — BASIC METABOLIC PANEL
Anion gap: 9 (ref 5–15)
BUN: 10 mg/dL (ref 6–20)
CALCIUM: 8.8 mg/dL — AB (ref 8.9–10.3)
CO2: 26 mmol/L (ref 22–32)
Chloride: 106 mmol/L (ref 101–111)
Creatinine, Ser: 0.69 mg/dL (ref 0.44–1.00)
GFR calc Af Amer: >60 mL/min (ref >60)
GLUCOSE: 107 mg/dL — AB (ref 65–99)
POTASSIUM: 3.2 mmol/L — AB (ref 3.5–5.1)
Sodium: 141 mmol/L (ref 135–145)

## 2015-12-25 LAB — I-STAT TROPONIN, ED: Troponin i, poc: 0 ng/mL (ref 0.00–0.08)

## 2015-12-25 NOTE — ED Notes (Addendum)
Per [facility], pt was woken up with pressure in her chest about 0100 and had resolved by the time EMS arrived. Still wanted to be checked out. Denies any pain at present. Pt is from [facility] assisted living.

## 2015-12-25 NOTE — ED Notes (Addendum)
Called daughter for patient. They were not contacted by [facility].

## 2015-12-25 NOTE — ED Provider Notes (Signed)
CSN: [id number]     Arrival date & time 12/25/15  0330 History   First MD Initiated Contact with Patient 12/25/15 [phone]     Chief Complaint  Patient presents with  . Chest Pain     (Consider location/radiation/quality/duration/timing/severity/associated sxs/prior Treatment) Patient is a 82 y.o. female presenting with chest pain.  Chest Pain Pain location:  Substernal area Pain quality: aching   Pain radiates to:  Does not radiate Pain radiates to the back: no   Pain severity:  Mild Duration:  2 minutes Timing:  Constant Progression:  Resolved Chronicity:  Recurrent Context: not breathing, not eating and no intercourse   Relieved by:  None tried Worsened by:  Nothing tried Ineffective treatments:  None tried Associated symptoms: no abdominal pain, no dizziness, no fever and no numbness     Past Medical History  Diagnosis Date  . Paroxysmal a-fib ([place])   . Hypertension   . Hallucinations   . Bradycardia    Past Surgical History  Procedure Laterality Date  . Shoulder surgery Right   . Knee surgery Left   . Brain surgery     No family history on file. Social History  Substance Use Topics  . Smoking status: Never Smoker   . Smokeless tobacco: None  . Alcohol Use: Yes     Comment: social   OB History    No data available     Review of Systems  Constitutional: Negative for fever and activity change.  Eyes: Negative for pain.  Respiratory: Negative for choking.   Cardiovascular: Positive for chest pain.  Gastrointestinal: Negative for abdominal pain.  Endocrine: Negative for polydipsia and polyuria.  Genitourinary: Negative for dysuria.  Neurological: Negative for dizziness and numbness.  All other systems reviewed and are negative.     Allergies  Amoxicillin and Demerol  Home Medications   Prior to Admission medications   Not on File   BP 175/62 mmHg  Pulse 59  Temp(Src) 98.1 F (36.7 C) (Oral)  Resp 14  SpO2 98% Physical Exam  Constitutional:  She is oriented to person, place, and time. She appears well-developed and well-nourished.  HENT:  Head: Normocephalic and atraumatic.  Neck: Normal range of motion.  Cardiovascular: Normal rate and regular rhythm.   Pulmonary/Chest: No stridor. No respiratory distress.  Abdominal: She exhibits no distension.  Neurological: She is alert and oriented to person, place, and time. No cranial nerve deficit. Coordination normal.  Nursing note and vitals reviewed.   ED Course  Procedures (including critical care time) Labs Review Labs Reviewed  BASIC METABOLIC PANEL - Abnormal; Notable for the following:    Potassium 3.2 (*)    Glucose, Bld 107 (*)    Calcium 8.8 (*)    All other components within normal limits  CBC - Abnormal; Notable for the following:    Hemoglobin 11.7 (*)    All other components within normal limits  TROPONIN I  Randolm Idol, ED    Imaging Review Dg Chest 2 View  12/25/2015  CLINICAL DATA:  82 year old female with chest pain EXAM: CHEST  2 VIEW COMPARISON:  None. FINDINGS: Two views of the chest demonstrate emphysematous changes of the lungs. No focal consolidation, pleural effusion, or pneumothorax. The cardiac silhouette is within normal limits. There is osteopenia with chronic appearing fracture deformity of the right clavicle. IMPRESSION: No active cardiopulmonary disease. Electronically Signed   By: Anner Crete M.D.   On: 12/25/2015 04:20   I have personally reviewed and evaluated these  images and lab results as part of my medical decision-making.   EKG Interpretation   Date/Time:  Monday December 25 2015 03:36:15 EST Ventricular Rate:  60 PR Interval:  134 QRS Duration: 110 QT Interval:  452 QTC Calculation: 452 R Axis:   -38 Text Interpretation:  Normal sinus rhythm Left axis deviation Incomplete  right bundle branch block Left ventricular hypertrophy with repolarization  abnormality Cannot rule out Septal infarct , age undetermined Abnormal  ECG  Confirmed by [facility] MD, Corene Cornea [phone]) on 12/25/2015 7:13:00 AM      MDM   Final diagnoses:  Chest pain, unspecified chest pain type    A 24-year-old female history of hypertension and atrial fibrillation presents to the emergency department today with nonspecific chest pain. Patient states she felt a small pressure achiness in her chest that lasted a minute or 2 or 1:00 this morning which probably result. Had no associated symptoms. EKG here without obvious ischemia. The troponin is negative making ACS unlikely. She has had a fever or cough making pneumonia unlikely as well. Story is not consistent with more acute causes such as dissection, pulmonary embolus or other emergent causes at this time.    Merrily Pew, MD 12/25/15 [phone]

## 2015-12-26 ENCOUNTER — Emergency Department (HOSPITAL_COMMUNITY): Payer: Medicare Other

## 2015-12-26 ENCOUNTER — Encounter (HOSPITAL_COMMUNITY): Payer: Self-pay | Admitting: Emergency Medicine

## 2015-12-26 ENCOUNTER — Inpatient Hospital Stay (HOSPITAL_COMMUNITY)
Admission: EM | Admit: 2015-12-26 | Discharge: 2015-12-28 | DRG: 309 | Disposition: A | Discharge status: Home Health | Payer: Medicare Other | Attending: Family Medicine | Admitting: Family Medicine

## 2015-12-26 DIAGNOSIS — F0390 Unspecified dementia without behavioral disturbance: Secondary | ICD-10-CM | POA: Diagnosis present

## 2015-12-26 DIAGNOSIS — N390 Urinary tract infection, site not specified: Secondary | ICD-10-CM | POA: Diagnosis not present

## 2015-12-26 DIAGNOSIS — I452 Bifascicular block: Secondary | ICD-10-CM | POA: Diagnosis present

## 2015-12-26 DIAGNOSIS — R4182 Altered mental status, unspecified: Secondary | ICD-10-CM | POA: Diagnosis not present

## 2015-12-26 DIAGNOSIS — F329 Major depressive disorder, single episode, unspecified: Secondary | ICD-10-CM | POA: Diagnosis present

## 2015-12-26 DIAGNOSIS — Z8673 Personal history of transient ischemic attack (TIA), and cerebral infarction without residual deficits: Secondary | ICD-10-CM

## 2015-12-26 DIAGNOSIS — R440 Auditory hallucinations: Secondary | ICD-10-CM | POA: Diagnosis present

## 2015-12-26 DIAGNOSIS — E876 Hypokalemia: Secondary | ICD-10-CM | POA: Diagnosis present

## 2015-12-26 DIAGNOSIS — I2489 Other forms of acute ischemic heart disease: Secondary | ICD-10-CM | POA: Diagnosis present

## 2015-12-26 DIAGNOSIS — B9689 Other specified bacterial agents as the cause of diseases classified elsewhere: Secondary | ICD-10-CM | POA: Diagnosis present

## 2015-12-26 DIAGNOSIS — I248 Other forms of acute ischemic heart disease: Secondary | ICD-10-CM | POA: Diagnosis present

## 2015-12-26 DIAGNOSIS — I10 Essential (primary) hypertension: Secondary | ICD-10-CM | POA: Diagnosis present

## 2015-12-26 DIAGNOSIS — I4891 Unspecified atrial fibrillation: Principal | ICD-10-CM | POA: Diagnosis present

## 2015-12-26 DIAGNOSIS — N12 Tubulo-interstitial nephritis, not specified as acute or chronic: Secondary | ICD-10-CM | POA: Diagnosis present

## 2015-12-26 DIAGNOSIS — I4819 Other persistent atrial fibrillation: Secondary | ICD-10-CM | POA: Diagnosis present

## 2015-12-26 DIAGNOSIS — R441 Visual hallucinations: Secondary | ICD-10-CM | POA: Diagnosis present

## 2015-12-26 DIAGNOSIS — H3530 Unspecified macular degeneration: Secondary | ICD-10-CM | POA: Diagnosis present

## 2015-12-26 DIAGNOSIS — Z7982 Long term (current) use of aspirin: Secondary | ICD-10-CM

## 2015-12-26 DIAGNOSIS — Z881 Allergy status to other antibiotic agents status: Secondary | ICD-10-CM

## 2015-12-26 DIAGNOSIS — Z885 Allergy status to narcotic agent status: Secondary | ICD-10-CM

## 2015-12-26 DIAGNOSIS — Z79899 Other long term (current) drug therapy: Secondary | ICD-10-CM

## 2015-12-26 HISTORY — DX: Diverticulitis of intestine, part unspecified, without perforation or abscess without bleeding: K57.92

## 2015-12-26 HISTORY — DX: Cysticercosis of central nervous system: B69.0

## 2015-12-26 HISTORY — DX: Transient cerebral ischemic attack, unspecified: G45.9

## 2015-12-26 HISTORY — DX: Visual hallucinations: R44.1

## 2015-12-26 HISTORY — DX: Unspecified macular degeneration: H35.30

## 2015-12-26 LAB — CBC
HEMATOCRIT: 39 % (ref 36.0–46.0)
HEMOGLOBIN: 12.9 g/dL (ref 12.0–15.0)
MCH: 29.2 pg (ref 26.0–34.0)
MCHC: 33.1 g/dL (ref 30.0–36.0)
MCV: 88.2 fL (ref 78.0–100.0)
Platelets: 243 10*3/uL (ref 150–400)
RBC: 4.42 MIL/uL (ref 3.87–5.11)
RDW: 14.2 % (ref 11.5–15.5)
WBC: 6 10*3/uL (ref 4.0–10.5)

## 2015-12-26 LAB — URINALYSIS, ROUTINE W REFLEX MICROSCOPIC
BILIRUBIN URINE: NEGATIVE
GLUCOSE, UA: NEGATIVE mg/dL
Hgb urine dipstick: NEGATIVE
KETONES UR: 15 mg/dL — AB
NITRITE: POSITIVE — AB
PH: 7 (ref 5.0–8.0)
Protein, ur: NEGATIVE mg/dL
SPECIFIC GRAVITY, URINE: 1.006 (ref 1.005–1.030)

## 2015-12-26 LAB — COMPREHENSIVE METABOLIC PANEL
ALBUMIN: 3.8 g/dL (ref 3.5–5.0)
ALT: 17 U/L (ref 14–54)
ANION GAP: 13 (ref 5–15)
AST: 26 U/L (ref 15–41)
Alkaline Phosphatase: 101 U/L (ref 38–126)
BILIRUBIN TOTAL: 0.6 mg/dL (ref 0.3–1.2)
BUN: 12 mg/dL (ref 6–20)
CHLORIDE: 98 mmol/L — AB (ref 101–111)
CO2: 28 mmol/L (ref 22–32)
Calcium: 9.5 mg/dL (ref 8.9–10.3)
Creatinine, Ser: 0.89 mg/dL (ref 0.44–1.00)
GFR calc Af Amer: >60 mL/min (ref >60)
GFR calc non Af Amer: 57 mL/min — ABNORMAL LOW (ref >60)
GLUCOSE: 100 mg/dL — AB (ref 65–99)
POTASSIUM: 3 mmol/L — AB (ref 3.5–5.1)
SODIUM: 139 mmol/L (ref 135–145)
TOTAL PROTEIN: 8.2 g/dL — AB (ref 6.5–8.1)

## 2015-12-26 LAB — URINE MICROSCOPIC-ADD ON

## 2015-12-26 MED ORDER — SODIUM CHLORIDE 0.9 % IV BOLUS (SEPSIS)
1000.0000 mL | Freq: Once | INTRAVENOUS | Status: AC
  Administered 2015-12-26: 1000 mL via INTRAVENOUS

## 2015-12-26 MED ORDER — POTASSIUM CHLORIDE CRYS ER 20 MEQ PO TBCR
40.0000 meq | EXTENDED_RELEASE_TABLET | Freq: Once | ORAL | Status: AC
  Administered 2015-12-26: 40 meq via ORAL
  Filled 2015-12-26: qty 2

## 2015-12-26 MED ORDER — DILTIAZEM LOAD VIA INFUSION
10.0000 mg | Freq: Once | INTRAVENOUS | Status: AC
  Administered 2015-12-26: 10 mg via INTRAVENOUS
  Filled 2015-12-26: qty 10

## 2015-12-26 MED ORDER — POTASSIUM CHLORIDE 10 MEQ/100ML IV SOLN
10.0000 meq | Freq: Once | INTRAVENOUS | Status: AC
  Administered 2015-12-26: 10 meq via INTRAVENOUS
  Filled 2015-12-26: qty 100

## 2015-12-26 MED ORDER — METOPROLOL TARTRATE 1 MG/ML IV SOLN
5.0000 mg | Freq: Once | INTRAVENOUS | Status: AC
  Administered 2015-12-26: 5 mg via INTRAVENOUS
  Filled 2015-12-26: qty 5

## 2015-12-26 MED ORDER — DEXTROSE 5 % IV SOLN
1.0000 g | INTRAVENOUS | Status: DC
  Administered 2015-12-27 (×2): 1 g via INTRAVENOUS
  Filled 2015-12-26 (×3): qty 10

## 2015-12-26 MED ORDER — DILTIAZEM HCL 100 MG IV SOLR
5.0000 mg/h | INTRAVENOUS | Status: DC
  Administered 2015-12-26 – 2015-12-27 (×2): 5 mg/h via INTRAVENOUS
  Filled 2015-12-26 (×2): qty 100

## 2015-12-26 NOTE — ED Notes (Signed)
CBG 113. RN notified.

## 2015-12-26 NOTE — ED Provider Notes (Signed)
CSN: [id number]     Arrival date & time 12/26/15  1413 History   First MD Initiated Contact with Patient 12/26/15 2019     Chief Complaint  Patient presents with  . Altered Mental Status     (Consider location/radiation/quality/duration/timing/severity/associated sxs/prior Treatment) HPI  82 year old female presents with worsening mental decline. History is taken from the daughters at the bedside. Patient herself endorses that she has had visual and auditory hallucinations. She tells me this is been on for years. Family states it is significantly worse over the last several months since September. 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 Patient has hallucinations where she sees people that other people are saying and also has become paranoid. Denied her to ago she tried to walk out of her living facility. She is in independent living. Recently moved down from [place] about 3 weeks ago. Does not yet have a PCP. Has seen neurology, saw on 1/11, for similar symptoms and was started on risperdal. Family has not noticed any help with this. No fevers or urine symptoms. Patient's status has declined more rapidly over past few days, but no new symptoms or new AMS, just more hallucinations and paranoia.  Past Medical History  Diagnosis Date  . Paroxysmal a-fib ([place])   . Hypertension   . Hallucinations   . Bradycardia    Past Surgical History  Procedure Laterality Date  . Shoulder surgery Right   . Knee surgery Left   . Brain surgery     History reviewed. No pertinent family history. Social History  Substance Use Topics  . Smoking status: Never Smoker   . Smokeless tobacco: None  . Alcohol Use: Yes     Comment: social   OB History    No data available     Review of Systems  Unable to perform ROS: Dementia      Allergies  Amoxicillin and Demerol  Home Medications   Prior to Admission medications    Medication Sig Start Date End Date Taking? Authorizing Provider  ALPRAZolam Duanne Moron) 0.5 MG tablet Take 0.5 mg by mouth 2 (two) times daily as needed for anxiety.    Historical Provider, MD  aspirin 325 MG tablet Take 325 mg by mouth daily.    Historical Provider, MD  diltiazem (DILTIAZEM CD) 240 MG 24 hr capsule Take 240 mg by mouth daily.    Historical Provider, MD  metoprolol succinate (TOPROL-XL) 25 MG 24 hr tablet Take 25 mg by mouth daily.    Historical Provider, MD  Multiple Vitamins-Minerals (PRESERVISION AREDS 2 PO) Take 1 tablet by mouth daily.    Historical Provider, MD  risperiDONE (RISPERDAL) 1 MG tablet Take 0.5 mg by mouth at bedtime.    Historical Provider, MD  venlafaxine (EFFEXOR) 37.5 MG tablet Take 37.5 mg by mouth 2 (two) times daily.    Historical Provider, MD   BP 166/71 mmHg  Pulse 72  Temp(Src) 98 F (36.7 C) (Oral)  Resp 16  SpO2 100% Physical Exam  Constitutional: She appears well-developed and well-nourished.  HENT:  Head: Normocephalic and atraumatic.  Right Ear: External ear normal.  Left Ear: External ear normal.  Nose: Nose normal.  Eyes: EOM are normal. Pupils are equal, round, and reactive to light. Right eye exhibits no discharge. Left eye exhibits no discharge.  Neck: Neck supple.  Cardiovascular: Normal rate, regular rhythm and normal heart sounds.   Pulmonary/Chest: Effort normal and breath  sounds normal.  Abdominal: Soft. She exhibits no distension. There is no tenderness.  Neurological: She is alert. She is disoriented.  Patient is awake and alert. She is confused, and rambles/changes subjects. CN 2-12 grossly intact. 5/5 strength in all 4 extremities. Grossly normal sensation  Skin: Skin is warm and dry.  Nursing note and vitals reviewed.   ED Course  Procedures (including critical care time) Labs Review Labs Reviewed  COMPREHENSIVE METABOLIC PANEL - Abnormal; Notable for the following:    Potassium 3.0 (*)    Chloride 98 (*)     Glucose, Bld 100 (*)    Total Protein 8.2 (*)    GFR calc non Af Amer 57 (*)    All other components within normal limits  URINALYSIS, ROUTINE W REFLEX MICROSCOPIC (NOT AT [facility]) - Abnormal; Notable for the following:    Color, Urine STRAW (*)    APPearance CLOUDY (*)    Ketones, ur 15 (*)    Nitrite POSITIVE (*)    Leukocytes, UA LARGE (*)    All other components within normal limits  URINE MICROSCOPIC-ADD ON - Abnormal; Notable for the following:    Squamous Epithelial / LPF 0-5 (*)    Bacteria, UA MANY (*)    All other components within normal limits  URINE CULTURE  CBC  CBG MONITORING, ED    Imaging Review Dg Chest 2 View  12/25/2015  CLINICAL DATA:  82 year old female with chest pain EXAM: CHEST  2 VIEW COMPARISON:  None. FINDINGS: Two views of the chest demonstrate emphysematous changes of the lungs. No focal consolidation, pleural effusion, or pneumothorax. The cardiac silhouette is within normal limits. There is osteopenia with chronic appearing fracture deformity of the right clavicle. IMPRESSION: No active cardiopulmonary disease. Electronically Signed   By: Anner Crete M.D.   On: 12/25/2015 04:20   I have personally reviewed and evaluated these images and lab results as part of my medical decision-making.   EKG Interpretation   Date/Time:  Tuesday December 26 2015 21:52:00 EST Ventricular Rate:  149 PR Interval:    QRS Duration: 106 QT Interval:  286 QTC Calculation: 450 R Axis:   -53 Text Interpretation:  Age not entered, assumed to be  82 years old for  purpose of ECG interpretation Atrial fibrillation Left anterior fascicular  block LVH with secondary repolarization abnormality ST depression,  consider ischemia, diffuse lds A fib new compared to Dec 25 2015 Confirmed  by Regenia Skeeter  MD, Aveena Bari [phone]) on 12/26/2015 10:06:28 PM      CRITICAL CARE Performed by: Sherwood Gambler T   Total critical care time: 35 minutes  Critical care time was exclusive of  separately billable procedures and treating other patients.  Critical care was necessary to treat or prevent imminent or life-threatening deterioration.  Critical care was time spent personally by me on the following activities: development of treatment plan with patient and/or surrogate as well as nursing, discussions with consultants, evaluation of patient's response to treatment, examination of patient, obtaining history from patient or surrogate, ordering and performing treatments and interventions, ordering and review of laboratory studies, ordering and review of radiographic studies, pulse oximetry and re-evaluation of patient's condition.  MDM   Final diagnoses:  Atrial fibrillation with RVR ([place])  UTI (lower urinary tract infection)    Patient's/family's primary complaint is progressive dementia, acutely worse over the last couple days. Afebrile, initial vital signs are unremarkable. However then she went into atrophic fibrillation with RVR. Patient was given IV metoprolol  given she is on this at home but her heart rate did not budge. She was then started on Cardizem bolus and drip. Multiple possible contributing factors including hypokalemia (given oral and IV potassium) as well as an acute infection from UTI. CT head does not show any acute bleeding. Patient's heart rate has been difficult to control, she will need to be admitted. Admit to hospitalist, Dr. Loleta Books to admit.    Sherwood Gambler, MD 12/27/15 [phone]

## 2015-12-26 NOTE — ED Notes (Signed)
In and out cath done per protocol. Second assist G Santanella-Small.  Clear yellow urine obtained

## 2015-12-26 NOTE — ED Notes (Addendum)
Pt family reports declining mental faculties since being placed on Cardizem in September. Pt has frequent hallucinations with extreme paranoia. Pt incorrectly states birthday and current year. Pt states she knows where she is, but cannot verbalize location. Pt narrative of situation nonsensical. Pt discussing the people in her house that only she can see that take her stuff and the bugs she has in her house.

## 2015-12-26 NOTE — Progress Notes (Signed)
ANTIBIOTIC CONSULT NOTE - INITIAL  Pharmacy Consult for Ceftriaxone  Indication: UTI  Allergies  Allergen Reactions  . Amoxicillin Other (See Comments)    unknown  . Demerol [Meperidine]     See things    Vital Signs: Temp: 98 F (36.7 C) (01/17 1628) Temp Source: Oral (01/17 1628) BP: 140/116 mmHg (01/17 2200) Pulse Rate: 152 (01/17 2200)  Labs:  Recent Labs  12/25/15 0356 12/26/15 1536  WBC 5.2 6.0  HGB 11.7* 12.9  PLT 230 243  CREATININE 0.69 0.89    Medical History: Past Medical History  Diagnosis Date  . Paroxysmal a-fib ([place])   . Hypertension   . Hallucinations   . Bradycardia     Assessment: 82 y/o F with hallucinations/altered mental status. WBC WNL. Abnormal U/A. Amoxicillin allergy per record review from [place] is Nausea/Vomiting.   Plan:  -Ceftriaxone 1g IV q24h -F/U urine culture  Narda Bonds 12/26/2015,11:56 PM

## 2015-12-26 NOTE — ED Notes (Addendum)
Pt here with family c/o hallucinations and increased AMS; pt recently moved to [facility] recently; pt with confusion x months; pt family feels like could be from cardiazem; pt with visual and auditory hallucinations; pt alert to person only

## 2015-12-26 NOTE — ED Notes (Signed)
Entered pt's room to get urine sample and pulse was reading at 145. Initiated cardiac monitoring and patient is in Afib with rates in 130s-150s.

## 2015-12-27 ENCOUNTER — Encounter (HOSPITAL_COMMUNITY): Payer: Self-pay | Admitting: Family Medicine

## 2015-12-27 DIAGNOSIS — N390 Urinary tract infection, site not specified: Secondary | ICD-10-CM | POA: Diagnosis not present

## 2015-12-27 DIAGNOSIS — I248 Other forms of acute ischemic heart disease: Secondary | ICD-10-CM | POA: Diagnosis present

## 2015-12-27 DIAGNOSIS — R440 Auditory hallucinations: Secondary | ICD-10-CM | POA: Diagnosis present

## 2015-12-27 DIAGNOSIS — R441 Visual hallucinations: Secondary | ICD-10-CM | POA: Diagnosis present

## 2015-12-27 DIAGNOSIS — I4891 Unspecified atrial fibrillation: Secondary | ICD-10-CM | POA: Diagnosis not present

## 2015-12-27 DIAGNOSIS — F0390 Unspecified dementia without behavioral disturbance: Secondary | ICD-10-CM | POA: Diagnosis present

## 2015-12-27 DIAGNOSIS — N12 Tubulo-interstitial nephritis, not specified as acute or chronic: Secondary | ICD-10-CM | POA: Diagnosis present

## 2015-12-27 DIAGNOSIS — Z885 Allergy status to narcotic agent status: Secondary | ICD-10-CM | POA: Diagnosis not present

## 2015-12-27 DIAGNOSIS — F0391 Unspecified dementia with behavioral disturbance: Secondary | ICD-10-CM

## 2015-12-27 DIAGNOSIS — H3530 Unspecified macular degeneration: Secondary | ICD-10-CM | POA: Diagnosis present

## 2015-12-27 DIAGNOSIS — I10 Essential (primary) hypertension: Secondary | ICD-10-CM | POA: Diagnosis present

## 2015-12-27 DIAGNOSIS — I4819 Other persistent atrial fibrillation: Secondary | ICD-10-CM | POA: Diagnosis present

## 2015-12-27 DIAGNOSIS — Z8673 Personal history of transient ischemic attack (TIA), and cerebral infarction without residual deficits: Secondary | ICD-10-CM | POA: Diagnosis not present

## 2015-12-27 DIAGNOSIS — Z79899 Other long term (current) drug therapy: Secondary | ICD-10-CM | POA: Diagnosis not present

## 2015-12-27 DIAGNOSIS — I452 Bifascicular block: Secondary | ICD-10-CM | POA: Diagnosis present

## 2015-12-27 DIAGNOSIS — F329 Major depressive disorder, single episode, unspecified: Secondary | ICD-10-CM | POA: Diagnosis present

## 2015-12-27 DIAGNOSIS — Z7982 Long term (current) use of aspirin: Secondary | ICD-10-CM | POA: Diagnosis not present

## 2015-12-27 DIAGNOSIS — E876 Hypokalemia: Secondary | ICD-10-CM | POA: Diagnosis present

## 2015-12-27 DIAGNOSIS — B9689 Other specified bacterial agents as the cause of diseases classified elsewhere: Secondary | ICD-10-CM | POA: Diagnosis present

## 2015-12-27 DIAGNOSIS — Z881 Allergy status to other antibiotic agents status: Secondary | ICD-10-CM | POA: Diagnosis not present

## 2015-12-27 LAB — BASIC METABOLIC PANEL
ANION GAP: 12 (ref 5–15)
BUN: 9 mg/dL (ref 6–20)
CHLORIDE: 105 mmol/L (ref 101–111)
CO2: 24 mmol/L (ref 22–32)
Calcium: 8.5 mg/dL — ABNORMAL LOW (ref 8.9–10.3)
Creatinine, Ser: 0.7 mg/dL (ref 0.44–1.00)
Glucose, Bld: 110 mg/dL — ABNORMAL HIGH (ref 65–99)
POTASSIUM: 3.5 mmol/L (ref 3.5–5.1)
SODIUM: 141 mmol/L (ref 135–145)

## 2015-12-27 LAB — CBC
HEMATOCRIT: 36.1 % (ref 36.0–46.0)
Hemoglobin: 11.9 g/dL — ABNORMAL LOW (ref 12.0–15.0)
MCH: 29.3 pg (ref 26.0–34.0)
MCHC: 33 g/dL (ref 30.0–36.0)
MCV: 88.9 fL (ref 78.0–100.0)
PLATELETS: 297 10*3/uL (ref 150–400)
RBC: 4.06 MIL/uL (ref 3.87–5.11)
RDW: 14.2 % (ref 11.5–15.5)
WBC: 6.7 10*3/uL (ref 4.0–10.5)

## 2015-12-27 LAB — TROPONIN I
TROPONIN I: 0.59 ng/mL — AB (ref <0.031)
TROPONIN I: 0.56 ng/mL — AB (ref <0.031)
Troponin I: 0.81 ng/mL (ref <0.031)
Troponin I: 0.7 ng/mL (ref <0.031)

## 2015-12-27 LAB — MAGNESIUM: MAGNESIUM: 1.8 mg/dL (ref 1.7–2.4)

## 2015-12-27 LAB — CBG MONITORING, ED: Glucose-Capillary: 113 mg/dL — ABNORMAL HIGH (ref 65–99)

## 2015-12-27 LAB — MRSA PCR SCREENING: MRSA by PCR: NEGATIVE

## 2015-12-27 MED ORDER — ACETAMINOPHEN 650 MG RE SUPP: 650.0000 mg | Freq: Four times a day (QID) | RECTAL | Status: DC | PRN

## 2015-12-27 MED ORDER — VENLAFAXINE HCL 37.5 MG PO TABS
37.5000 mg | ORAL_TABLET | Freq: Two times a day (BID) | ORAL | Status: DC
  Administered 2015-12-27 – 2015-12-28 (×4): 37.5 mg via ORAL
  Filled 2015-12-27 (×5): qty 1

## 2015-12-27 MED ORDER — DILTIAZEM HCL 25 MG/5ML IV SOLN
10.0000 mg | Freq: Once | INTRAVENOUS | Status: AC
  Administered 2015-12-27: 10 mg via INTRAVENOUS

## 2015-12-27 MED ORDER — ASPIRIN 325 MG PO TABS
325.0000 mg | ORAL_TABLET | Freq: Every day | ORAL | Status: DC
  Administered 2015-12-27 – 2015-12-28 (×2): 325 mg via ORAL
  Filled 2015-12-27 (×2): qty 1

## 2015-12-27 MED ORDER — DILTIAZEM HCL ER COATED BEADS 240 MG PO CP24
240.0000 mg | ORAL_CAPSULE | Freq: Every day | ORAL | Status: DC
  Administered 2015-12-27 – 2015-12-28 (×2): 240 mg via ORAL
  Filled 2015-12-27 (×2): qty 1

## 2015-12-27 MED ORDER — ENOXAPARIN SODIUM 40 MG/0.4ML ~~LOC~~ SOLN
40.0000 mg | SUBCUTANEOUS | Status: DC
  Administered 2015-12-27 – 2015-12-28 (×2): 40 mg via SUBCUTANEOUS
  Filled 2015-12-27 (×2): qty 0.4

## 2015-12-27 MED ORDER — ALPRAZOLAM 0.5 MG PO TABS
0.5000 mg | ORAL_TABLET | Freq: Two times a day (BID) | ORAL | Status: DC | PRN
  Administered 2015-12-27: 0.5 mg via ORAL
  Filled 2015-12-27: qty 1

## 2015-12-27 MED ORDER — RISPERIDONE 0.5 MG PO TABS
0.5000 mg | ORAL_TABLET | Freq: Every day | ORAL | Status: DC
  Administered 2015-12-27 (×2): 0.5 mg via ORAL
  Filled 2015-12-27 (×3): qty 1

## 2015-12-27 MED ORDER — ACETAMINOPHEN 325 MG PO TABS: 650.0000 mg | ORAL_TABLET | Freq: Four times a day (QID) | ORAL | Status: DC | PRN

## 2015-12-27 MED ORDER — POLYETHYLENE GLYCOL 3350 17 G PO PACK
17.0000 g | PACK | Freq: Every day | ORAL | Status: DC
  Administered 2015-12-27: 17 g via ORAL
  Filled 2015-12-27 (×2): qty 1

## 2015-12-27 MED ORDER — SODIUM CHLORIDE 0.9 % IJ SOLN
3.0000 mL | Freq: Two times a day (BID) | INTRAMUSCULAR | Status: DC
  Administered 2015-12-28: 3 mL via INTRAVENOUS

## 2015-12-27 MED ORDER — METOPROLOL SUCCINATE ER 25 MG PO TB24
25.0000 mg | ORAL_TABLET | Freq: Every day | ORAL | Status: DC
  Administered 2015-12-27 – 2015-12-28 (×2): 25 mg via ORAL
  Filled 2015-12-27 (×2): qty 1

## 2015-12-27 NOTE — Consult Note (Signed)
CARDIOLOGY CONSULT NOTE   Patient ID: Brittney Gutierrez MRN: [id number] DOB/AGE: [birth year] 82 y.o.  Admit date: 12/26/2015  Requesting Physician Dr. Raliegh Ip Primary Physician   No PCP Per Patient Primary Cardiologist   Was followed in [place]- has an appointment to establish care with Dr. Sallyanne Kuster 01/12/16 (records sent to [facility] office) Reason for Consultation  afib with RVR and elevated troponin   HPI: Brittney Gutierrez is a 82 y.o. 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   She lives at an independent living facility and recently moved to [place] from [place] because of declining function at home. From daughtersand from reviewing care everywhere, it appears that the patient has been having visual hallucinations over about the last 2.5 years. These are well formed hallucinations, usually of blue figures without eyes. She has macular degeneration and there was some suggestion that the hallucinations primarily occupy her areas of distorted vision and are [name] hallucinations. She was tried on Depakote and Keppra on the theory that they were partial complex seizures but this did not help. In this context the patient has been having worsening dementia (about a year ago she could no longer do her personal checks her finances, and over the last 2 months or so she has had worsening memory of naming things, including people like her daughters).   Does not yet have a PCP. She saw Dr. Lynnette Caffey at [facility] with neurology on 12/20/15, for similar symptoms and was started on risperdal. Family has not noticed any help with this. Pt family reports declining mental faculties since being placed on Cardizem in 10/2015 for afib with RVR. Pt has frequent  hallucinations with extreme paranoia.  She was seen in the ED on 12/25/15 for chest pain that resolved by the time EMS arrived. She ruled out and was sent home from the ED.   She then re-presented to [facility] the following day on 12/26/15 with worsening AMS. In the ED, the patient was afebrile but converted to A. fib with RVR. She had no leukocytosis but had mild hypokalemia (K 3.2), and her urinalysis was positive for WBCs, bacteria, and nitrates. A head CT was normal. She was given ceftriaxone and a Cardizem drip was started.   Today she is seen in her room alone. No family present. She goes on and on about her babies and the man who passed- none of which makes sense. She is able to tell me that she has not had any further chest pain, but not much else. She was able to tell me that she gets her medications from the staff at her ALF. She does not remember if she was symptomatic with her afib with RVR. She is very pleasant and thanks me for my time.    Past Medical History  Diagnosis Date  . Paroxysmal a-fib ([place])   . Hypertension   . Formed visual hallucinations     [name]?  Failed Keppra and Depakote  . Macular degeneration   . Diverticulitis   . TIA (transient ischemic attack)   . Neurocysticercosis     Craniotomy at [facility] in early 2000s     Past Surgical History  Procedure Laterality Date  . Shoulder surgery Right   . Knee surgery Left   . Brain surgery  Neurocysticercosis    Allergies  Allergen Reactions  . Amoxicillin Other (See Comments)    unknown  . Demerol [Meperidine]     See things    I have reviewed the patient's current medications . aspirin  325 mg Oral Daily  . cefTRIAXone (ROCEPHIN)  IV  1 g Intravenous Q24H  . diltiazem  240 mg Oral Daily  . enoxaparin (LOVENOX) injection  40 mg Subcutaneous Q24H  . metoprolol succinate  25 mg Oral Daily  . polyethylene glycol  17 g Oral Daily  . risperiDONE  0.5 mg Oral QHS  . sodium chloride  3 mL Intravenous  Q12H  . venlafaxine  37.5 mg Oral BID   . diltiazem (CARDIZEM) infusion 5 mg/hr (12/27/15 0732)   acetaminophen **OR** acetaminophen, ALPRAZolam  Prior to Admission medications   Medication Sig Start Date End Date Taking? Authorizing Provider  acetaminophen (TYLENOL) 325 MG tablet Take 650 mg by mouth every 6 (six) hours as needed for mild pain.   Yes Historical Provider, MD  ALPRAZolam Duanne Moron) 0.5 MG tablet Take 0.5 mg by mouth 2 (two) times daily as needed for anxiety.   Yes Historical Provider, MD  aspirin 325 MG tablet Take 325 mg by mouth daily.   Yes Historical Provider, MD  diltiazem (DILTIAZEM CD) 240 MG 24 hr capsule Take 240 mg by mouth daily.   Yes Historical Provider, MD  metoprolol succinate (TOPROL-XL) 25 MG 24 hr tablet Take 25 mg by mouth daily.   Yes Historical Provider, MD  Multiple Vitamins-Minerals (PRESERVISION AREDS 2 PO) Take 1 tablet by mouth 2 (two) times daily.    Yes Historical Provider, MD  risperiDONE (RISPERDAL) 0.5 MG tablet Take 0.5 mg by mouth at bedtime.   Yes Historical Provider, MD  venlafaxine (EFFEXOR) 37.5 MG tablet Take 37.5 mg by mouth 2 (two) times daily.   Yes Historical Provider, MD     Social History   Social History  . Marital Status: Unknown    Spouse Name: N/A  . Number of Children: N/A  . Years of Education: N/A   Occupational History  . Not on file.   Social History Main Topics  . Smoking status: Never Smoker   . Smokeless tobacco: Not on file  . Alcohol Use: Yes     Comment: social  . Drug Use: No  . Sexual Activity: Not on file   Other Topics Concern  . Not on file   Social History Narrative    No family status information on file.   Family History  Problem Relation Age of Onset  . Dementia Neg Hx      ROS:  Full 14 point review of systems complete and found to be negative unless listed above.  Physical Exam: Blood pressure 138/60, pulse 68, temperature 98.2 F (36.8 C), temperature source Oral, resp. rate 18,  weight 130 lb 9.6 oz (59.24 kg), SpO2 100 %.  General: Well developed, well nourished, female in no acute distress Head: Eyes PERRLA, No xanthomas.   Normocephalic and atraumatic, oropharynx without edema or exudate.  Lungs:  CTAB Heart: HRRR S1 S2, no rub/gallop, Heart regular rate and rhythm with S1, S2  murmur. pulses are 2+ extrem.   Neck: No carotid bruits. No lymphadenopathy. no JVD. Abdomen: Bowel sounds present, abdomen soft and non-tender without masses or hernias noted. Msk:  No spine or cva tenderness. No weakness, no joint deformities or effusions. Extremities: No clubbing or cyanosis. No edema.  Neuro: Alert and oriented X 3.  No focal deficits noted. Psych:  Good affect, responds appropriately Skin: No rashes or lesions noted.  Labs:   Lab Results  Component Value Date   WBC 6.7 12/27/2015   HGB 11.9* 12/27/2015   HCT 36.1 12/27/2015   MCV 88.9 12/27/2015   PLT 297 12/27/2015   No results for input(s): INR in the last 72 hours.  Recent Labs Lab 12/26/15 1536 12/27/15 0410  NA 139 141  K 3.0* 3.5  CL 98* 105  CO2 28 24  BUN 12 9  CREATININE 0.89 0.70  CALCIUM 9.5 8.5*  PROT 8.2*  --   BILITOT 0.6  --   ALKPHOS 101  --   ALT 17  --   AST 26  --   GLUCOSE 100* 110*  ALBUMIN 3.8  --    MAGNESIUM  Date Value Ref Range Status  12/26/2015 1.8 1.7 - 2.4 mg/dL Final    Recent Labs  12/25/15 0702 12/27/15 0410 12/27/15 0949  TROPONINI <0.03 0.59* 0.81*    Recent Labs  12/25/15 0355  TROPIPOC 0.00    Echo: none   ECG:  #1: 12/27/15 Normal sinus rhythm, LAD, LVH, nonspecific ST and T wave abnormality #2: 12/26/15 afib with RVR, LAFB, ST depression in II, III and AVF, V3-V6 nm  Radiology:  Ct Head Wo Contrast  12/27/2015  CLINICAL DATA:  Hallucinations, altered mental status and confusion for months, recently worsening. History of hypertension and atrial fibrillation. EXAM: CT HEAD WITHOUT CONTRAST TECHNIQUE: Contiguous axial images were obtained from  the base of the skull through the vertex without intravenous contrast. COMPARISON:  None. FINDINGS: The ventricles and sulci are normal for age. No intraparenchymal hemorrhage, mass effect nor midline shift. Patchy supratentorial white matter hypodensities are less than expected for patient's age and though non-specific suggest sequelae of chronic small vessel ischemic disease. No acute large vascular territory infarcts. LEFT parietal encephalomalacia associated with old craniotomy. No abnormal extra-axial fluid collections. Basal cisterns are patent. Mild calcific atherosclerosis of the carotid siphons. No skull fracture. The included ocular globes and orbital contents are non-suspicious. Old RIGHT medial orbital blowout fracture. Status post bilateral ocular lens implants. Moderate RIGHT maxillary sinus mucosal thickening air-fluid level associated with wall thickening. IMPRESSION: No acute intracranial process. LEFT parietal encephalomalacia with overlying craniotomy could represent resection cavity old trauma, recommend correlation with surgical history. Involutional changes. Mild to moderate chronic small vessel ischemic disease. Acute on chronic RIGHT maxillary sinusitis. Electronically Signed   By: Elon Alas M.D.   On: 12/27/2015 00:32    ASSESSMENT AND PLAN:    Principal Problem:   Atrial fibrillation with RVR (HCC) Active Problems:   UTI (lower urinary tract infection)   Dementia   Hypokalemia   Brittney Gutierrez is a 82 y.o. 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   Afib with RVR: she spontaneously converted to NSR on IV dilt.  -- She has a long history of afib per grand daughter. CHADSVASC of at least 6 (HTN 1, Age 24, F sex 1, TIA 2). Previously on  Eliquis. Not a candidate for long term AC with worsening function status and advanced dementia.  -- Continue Cardizem 240mg  daily and Toprol XL 25mg    Elevated troponin: 0.03 (12/25/15- when seen in the ED for chest pain).  0.59--> 0.81 (  12/27/15) in the setting of afib with RVR. ECG with ST depression in II, III and AVF, V3-V6. Per grand-daughter, she does not think she has a history of CAD. Would not pursue further ischemic evaluation in the setting of advanced and worsening dementia + hallucinations. Would plan for medical therapy for assumed CAD. Continue ASA and BB. 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 Lewy-body dementia?]): she is followed by Dr. Lynnette Caffey at [facility]. Unlikely to have anything to do with Cardizem   HTN: BP currently well controlled on Cardizem 240mg  daily and Toprol XL 25mg    UTI: on ABx per IM  Hypokalemia: being repleted per IM   Signed: Eileen Stanford, PA-C 12/27/2015 12:54 PM  Pager [phone]  Co-Sign MD

## 2015-12-27 NOTE — Progress Notes (Signed)
CRITICAL VALUE ALERT  Critical value received:  Troponin 0.59  Date of notification:  12/27/15  Time of notification:  05:43  Critical value read back:yes  Nurse who received alert:  Alexanderjames Berg  MD notified (1st page):  yes  Time of first page:  05:47  MD notified (2nd page):  Time of second page:  Responding MD:    Time MD responded:

## 2015-12-27 NOTE — Progress Notes (Signed)
Utilization review completed.  

## 2015-12-27 NOTE — H&P (Signed)
History and Physical  Patient Name: Brittney Gutierrez     [id number]    DOB: 07/29/1927    DOA: 12/26/2015 Referring physician: Sherwood Gambler, MD PCP: No PCP Per Patient      Chief Complaint: Acute on chronic disorientation  HPI: Brittney Gutierrez is a 82 y.o. female with a past medical history significant for worsening dementia with hallucinations and paranoia, pAF not on AC, hx of TIA, and also history of neurocysticercosis status post craniotomy 15 years ago who presents with worsening confusion.  All history is collected from the daughters at the bedside from care everywhere. The patient recently moved down to [name] from [place] because of declining function at home.  From daughters, and from reviewing care everywhere, it appears that the patient has been having visual hallucinations over about the last 2.5 years. These are well formed hallucinations, usually of blue figures without eyes who did not respond to her voice. She has macular degeneration and there is some suggestion that the hallucinations primarily occupy her areas of distorted vision and are Brittney Gutierrez hallucinations. She was actually tried on Depakote and Keppra on the theory that they were partial complex seizures but this did not help. In this context the patient has been having worsening dementia (about a year ago she could no longer do her personal checks her finances, and over the last 2 months or so she has had worsening memory of naming things, including people like her daughters).    Three weeks ago the patient moved in to an independent living facility in [name]. Over the last 2 days, her daughters have noticed that she is more unsteady, more confused, and more not herself. They have not noticed fever, changes in urinary pattern, cough, sputum.  In the ED, the patient was afebrile, and in A. fib with RVR. She had no leukocytosis, she had mild hypokalemia, and her urinalysis was positive for WBCs, bacteria,  and nitrates. A head CT was normal. She was given ceftriaxone and a Cardizem drip was started. TRH was asked to evaluate for admission.     Review of Systems:  Pt complains of difficulty naming things. Pt denies any fever, chills, cough, dysuria, hematuria, vomiting.  All other systems negative except as just noted or noted in the history of present illness.  Allergies  Allergen Reactions  . Amoxicillin Other (See Comments)    unknown  . Demerol [Meperidine]     See things    Prior to Admission medications   Medication Sig Start Date End Date Taking? Authorizing Provider  acetaminophen (TYLENOL) 325 MG tablet Take 650 mg by mouth every 6 (six) hours as needed for mild pain.   Yes Historical Provider, MD  ALPRAZolam Duanne Moron) 0.5 MG tablet Take 0.5 mg by mouth 2 (two) times daily as needed for anxiety.   Yes Historical Provider, MD  aspirin 325 MG tablet Take 325 mg by mouth daily.   Yes Historical Provider, MD  diltiazem (DILTIAZEM CD) 240 MG 24 hr capsule Take 240 mg by mouth daily.   Yes Historical Provider, MD  metoprolol succinate (TOPROL-XL) 25 MG 24 hr tablet Take 25 mg by mouth daily.   Yes Historical Provider, MD  Multiple Vitamins-Minerals (PRESERVISION AREDS 2 PO) Take 1 tablet by mouth 2 (two) times daily.    Yes Historical Provider, MD  risperiDONE (RISPERDAL) 0.5 MG tablet Take 0.5 mg by mouth at bedtime.   Yes Historical Provider, MD  venlafaxine (EFFEXOR) 37.5 MG tablet Take 37.5 mg by mouth  2 (two) times daily.   Yes Historical Provider, MD    Past Medical History  Diagnosis Date  . Paroxysmal a-fib ([place])   . Hypertension   . Formed visual hallucinations     Brittney Gutierrez?  Failed Keppra and Depakote  . Macular degeneration   . Diverticulitis   . TIA (transient ischemic attack)   . Neurocysticercosis     Craniotomy at [facility] in early 2000s    Past Surgical History  Procedure Laterality Date  . Shoulder surgery Right   . Knee surgery Left   . Brain surgery       Neurocysticercosis    Family history: No family history of dementia.    Social History: Patient lives in independent living.  She is using a cane more this week (she never did previously).  She was able to drive until a month ago when she had an episode at the beginning of Dec. Where she left the car running while she left it unmanned and it rolled away; this was about when her daughters made her move to [name].       Physical Exam: BP 120/98 mmHg  Pulse 145  Temp(Src) 98 F (36.7 C) (Oral)  Resp 15  SpO2 92% General appearance: Elderly adult female, awake and in no acute distress.   Eyes: Anicteric, conjunctiva pink, lids and lashes normal.     ENT: No nasal deformity, discharge, or epistaxis.  OP moist without lesions.   Lymph: No cervical lymphadenopathy. Skin: Warm and dry.   Cardiac: Tachycardic, irregular, nl S1-S2, no murmurs appreciated.  Capillary refill is brisk.  Neck veins normal.  No LE edema.  Radial and DP pulses 2+ and symmetric. Respiratory: Normal respiratory rate and rhythm.  CTAB without rales or wheezes. Abdomen: Abdomen soft without rigidity.  No TTP. No ascites, distension.   MSK: No deformities or effusions. Neuro: Oriented to self and month. Not to place, state.  Slow to recall daughters' names at bedside.  1/3 three item recall.  Names pen but not phone.  Sensorium intact and responding to questions, attention appears normal.  Speech is fluent.  Moves all extremities equally and with normal coordination.   FTN testing normal.  No pronator drift.  Cranial nerves 3-12 intact.   Psych: Behavior appropriate.  Affect normal, pleasant.  No evidence of aural or visual hallucinations or delusions at this time.       Labs on Admission:  The metabolic panel shows hypokalemia, normal renal function. LFT normal. UA with nitrites, bacteria, WBC.   The complete blood count shows no leukocytosis, anemia or thrombocytopenia.   Radiological Exams on  Admission: Personally reviewed: Dg Chest 2 View 12/25/2015  No focal opacity.  Ct Head Wo Contrast 12/27/2015   IMPRESSION: No acute intracranial process. LEFT parietal encephalomalacia with overlying craniotomy could represent resection cavity old trauma, recommend correlation with surgical history. Involutional changes. Mild to moderate chronic small vessel ischemic disease. Acute on chronic RIGHT maxillary sinusitis. Electronically Signed   By: Elon Alas M.D.   On: 12/27/2015 00:32    EKG: Independently reviewed. Afib rate 149.  ST depression visible.      Assessment/Plan 1. Afib with RVR:  This is new. CHADS2Vasc 6.  Not on warfarin.   -Diltiazem gtt -Hold oral diltiazem and metoprolol for now -Replete K -Check magnesium -Fluid resuscitation complete -UTI treatment per below -Check troponin    2. Hypokalemia:  This is new.   -Check magnesium -Oral replacement and repeat BMP  3. UTI:  -Ceftriaxone 1g daily -Follow urine culture  4. Formed visual hallucinations:  MRI brain within last 1.5 years (since onset of hallucinations) reportedly normal.  Failed therapy with levetiracetam, valproic acid, quetiapine.  Currently on risperidone.  These appear to be Brittney Gutierrez hallucinations in setting of macular degeneration.    5. Dementia:  Family are attempting to schedule appointment with Geriatrics now that patient is here in [place] -PT/OT/Speech evaluation regarding placement  6. HTN and hx TIA:  Stable.  -Continue aspirin -Restart metoprolol and diltiazem PO when HR reaches parameters  7. Depression: -Continue venlafaxine    DVT PPx: Lovenox Diet: Cardiac Consultants: None Code Status: Full Family Communication: Daughters, three, present at bedside.  Acute presentation for AFib and possible UTI discussed.  Relation to overall decline, troublesome visual hallucinations and possible dementia discussed.    Medical decision making: What exists of the  patient's previous chart in [place] was reviewed in depth and the case was discussed with Dr. Regenia Skeeter. Patient seen 1:17 AM on 12/27/2015.  Disposition Plan:  Admit to inpatient for hypokalemia and UTI and Afib with RVR.  Consultation with PT/OT/Speech, and disposition per this eval.  Expect hospitalization for 2-3 days, and possibly return to assisted living.      Edwin Dada [facility] Pager [phone]

## 2015-12-27 NOTE — Progress Notes (Signed)
11:58 AM I agree with HPI/GPe and A/P per Dr. Loleta Books  82 y/o ? 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 2014 Macular degeneration H/o Neurocysticercosis s/p Cranaiectomy  ED, the patient was afebrile, and in A. fib with RVR. She had no leukocytosis, she had mild hypokalemia, and her urinalysis was positive for WBCs, bacteria, and nitrates. A head CT was normal. She was given ceftriaxone and a Cardizem drip was started    HEENT alrt but disoriented and attempting to name place without success CHEST clear no adde dsoudn CARDIAC s1 s2 RRR ABDOMEN soft nt nd no rebound no gaurd NEURO power 5/5, finger-nose finger intact, reflexes intact  Patient Active Problem List   Diagnosis Date Noted  . Atrial fibrillation with RVR ([place]) 12/27/2015  . UTI (lower urinary tract infection) 12/27/2015  . Dementia 12/27/2015  . Hypokalemia 12/27/2015   Afib rvr-2/2 to not taking meds.  Resumed Cardizem 240 CD and toprol xl 25.  HR now controlled ioff Dilt Gtt.  Would continue ASA 325 only for now.  Cardiology to weigh in Elevated troponin-Peak so far 0.81.  ekg this am compared with yesterday no isch changes.  ? 2/2 to demand ischemia.  Cycle troponins again.  Cardiology consulted Dementia with hallucination-Will need ALF level care.  Family interested in PT/Ot.  Discussed with family no known cures.  Should follow up with Neurologist who saw her in [place] last week and put her on Risperdal.  Long-term oputlook re: dementia irrespectiv eof cause discussed with family who express good understanding.   Verneita Griffes, MD [facility] [phone]

## 2015-12-27 NOTE — Evaluation (Signed)
Physical Therapy Evaluation Patient Details Name: Brittney Gutierrez MRN: [id number] DOB: Jul 29, 1927 Today's Date: 12/27/2015   History of Present Illness  Pt admitted with AMS and hallucinations, afib with RVR. CT negative for acute changes. PMH: dementia with hallucinations, afib, macular degeneration, HTN, TIA, depression.  Clinical Impression  Patient presents with baseline cognitive deficits, impaired balance, and decreased endurance impacting safe mobility. PTA, pt using SPC however would really benefit from RW for balance/safety. Will trial rollator next session as pt gets SOB with longer distances and needs to be able to get to dining hall for meals. Will follow acutely to maximize independence and mobilty prior to return home.    Follow Up Recommendations Home health PT;Supervision for mobility/OOB    Equipment Recommendations  Other (comment) (rollator)    Recommendations for Other Services       Precautions / Restrictions Precautions Precautions: Fall Restrictions Weight Bearing Restrictions: No      Mobility  Bed Mobility Overal bed mobility: Modified Independent                Transfers Overall transfer level: Needs assistance Equipment used: None Transfers: Sit to/from Stand Sit to Stand: Supervision         General transfer comment: supervision for safety.   Ambulation/Gait Ambulation/Gait assistance: Min assist Ambulation Distance (Feet): 175 Feet Assistive device: None Gait Pattern/deviations: Step-through pattern;Decreased stride length;Staggering right;Drifts right/left;Trunk flexed Gait velocity: decreased   General Gait Details: Slow, unsteady gait with staggering to the right. 3/4 DOE. 1 standing rest break for fatigue. Would benefit from RW.   Stairs            Wheelchair Mobility    Modified Rankin (Stroke Patients Only)       Balance Overall balance assessment: Needs assistance Sitting-balance support: Feet supported;No  upper extremity supported Sitting balance-Leahy Scale: Good     Standing balance support: During functional activity Standing balance-Leahy Scale: Fair Standing balance comment: Able to stand unsupported but mildly unsteady with sway noted.                              Pertinent Vitals/Pain Pain Assessment: No/denies pain    Home Living Family/patient expects to be discharged to:: Private residence ([place]) Living Arrangements: Alone Available Help at Discharge: Family;Available PRN/intermittently Type of Home: Apartment Home Access: Level entry     Home Layout: One level Home Equipment: Cane - single point;Shower seat - built in;Grab bars - toilet;Grab bars - tub/shower;Hand held shower head Additional Comments: Apartment has call lights throughout.    Prior Function Level of Independence: Independent with assistive device(s)         Comments: Pt walks with a cane.     Hand Dominance   Dominant Hand: Right    Extremity/Trunk Assessment   Upper Extremity Assessment: Defer to OT evaluation           Lower Extremity Assessment: Generalized weakness      Cervical / Trunk Assessment: Kyphotic  Communication   Communication: Expressive difficulties  Cognition Arousal/Alertness: Awake/alert Behavior During Therapy: WFL for tasks assessed/performed Overall Cognitive Status: History of cognitive impairments - at baseline       Memory: Decreased short-term memory              General Comments General comments (skin integrity, edema, etc.): 2 daughters present in room during session. Assisted with providing PLOF/history as pt with expressive difficulties.  Exercises        Assessment/Plan    PT Assessment Patient needs continued PT services  PT Diagnosis Difficulty walking   PT Problem List Decreased strength;Cardiopulmonary status limiting activity;Decreased activity tolerance;Decreased  cognition;Decreased mobility;Decreased balance;Decreased safety awareness;Decreased knowledge of use of DME  PT Treatment Interventions Balance training;Gait training;Functional mobility training;Therapeutic activities;Therapeutic exercise;Patient/family education;DME instruction   PT Goals (Current goals can be found in the Care Plan section) Acute Rehab PT Goals Patient Stated Goal: none stated PT Goal Formulation: With patient Time For Goal Achievement: 01/10/16 Potential to Achieve Goals: Good    Frequency Min 3X/week   Barriers to discharge Decreased caregiver support lives alone    Co-evaluation               End of Session Equipment Utilized During Treatment: Gait belt Activity Tolerance: Patient tolerated treatment well Patient left: in bed;with call bell/phone within reach;with bed alarm set;with family/visitor present Nurse Communication: Mobility status         Time: 0952-1010 PT Time Calculation (min) (ACUTE ONLY): 18 min   Charges:   PT Evaluation $PT Eval Moderate Complexity: 1 Procedure     PT G Codes:        Brittney Gutierrez A Brittney Gutierrez 12/27/2015, 11:01 AM  Wray Kearns, PT, DPT [phone]

## 2015-12-27 NOTE — Evaluation (Signed)
Occupational Therapy Evaluation Patient Details Name: Brittney Gutierrez MRN: [id number] DOB: 05-Sep-1927 Today's Date: 12/27/2015    History of Present Illness Pt admitted with AMS and hallucinations, afib with RVR. CT negative for acute changes. PMH: dementia with hallucinations, afib, macular degeneration, HTN, TIA, depression.   Clinical Impression   Pt was recently moved from her home in [place] one month ago. She was able to perform self care and ambulate with a cane, but was having difficulty using new TV remotes, telephone, washing machine. She was not routinely going to the dining room for meals. She has had difficulty with verbal expression which is frustrating for her. Pt provided supervision to min guard assist as she ambulated in her room and bathroom, performed grooming and donned undergarment. Will follow acutely.      Follow Up Recommendations  Home health OT    Equipment Recommendations  None recommended by OT    Recommendations for Other Services       Precautions / Restrictions Precautions Precautions: Fall      Mobility Bed Mobility Overal bed mobility: Modified Independent                Transfers Overall transfer level: Needs assistance Equipment used: None Transfers: Sit to/from Stand Sit to Stand: Supervision              Balance                                            ADL Overall ADL's : Needs assistance/impaired Eating/Feeding: Independent;Sitting   Grooming: Wash/dry hands;Standing;Supervision/safety   Upper Body Bathing: Set up;Sitting   Lower Body Bathing: Supervison/ safety;Sit to/from stand   Upper Body Dressing : Set up;Sitting   Lower Body Dressing: Supervision/safety;Sit to/from stand   Toilet Transfer: Min guard;Ambulation;Regular Toilet   Toileting- Water quality scientist and Hygiene: Supervision/safety;Sit to/from stand       Functional mobility during ADLs: Min guard        Vision Additional Comments: pt frequently loses her glasses   Perception     Praxis      Pertinent Vitals/Pain Pain Assessment: No/denies pain     Hand Dominance Right   Extremity/Trunk Assessment Upper Extremity Assessment Upper Extremity Assessment: Overall WFL for tasks assessed (arthritic changes in hands)   Lower Extremity Assessment Lower Extremity Assessment: Defer to PT evaluation       Communication Communication Communication: Expressive difficulties   Cognition Arousal/Alertness: Awake/alert Behavior During Therapy: WFL for tasks assessed/performed Overall Cognitive Status: History of cognitive impairments - at baseline       Memory: Decreased short-term memory             General Comments       Exercises       Shoulder Instructions      Home Living Family/patient expects to be discharged to:: Private residence ([facility]) Living Arrangements: Alone Available Help at Discharge: Family;Available PRN/intermittently Type of Home: Apartment Home Access: Level entry (lives on first floor)     Home Layout: One level     Bathroom Shower/Tub: Occupational psychologist: Standard     Home Equipment: Cane - single point;Shower seat - built in;Grab bars - toilet;Grab bars - tub/shower;Hand held shower head   Additional Comments: Apartment has call lights throughout.      Prior Functioning/Environment Level of  Independence: Independent with assistive device(s)        Comments: Pt walks with a cane.    OT Diagnosis: Generalized weakness;Cognitive deficits   OT Problem List: Impaired balance (sitting and/or standing);Impaired vision/perception;Decreased cognition   OT Treatment/Interventions: Self-care/ADL training;Patient/family education    OT Goals(Current goals can be found in the care plan section) Acute Rehab OT Goals Patient Stated Goal: pt did not state OT Goal Formulation: With  patient Time For Goal Achievement: 01/03/16 Potential to Achieve Goals: Good ADL Goals Pt Will Perform Grooming: with modified independence;standing Pt Will Perform Lower Body Bathing: with modified independence;sit to/from stand Pt Will Perform Lower Body Dressing: with modified independence;sit to/from stand Pt Will Transfer to Toilet: with modified independence;ambulating;regular height toilet Pt Will Perform Toileting - Clothing Manipulation and hygiene: with modified independence;sit to/from stand  OT Frequency: Min 2X/week   Barriers to D/C:            Co-evaluation              End of Session Equipment Utilized During Treatment: Gait belt  Activity Tolerance: Patient tolerated treatment well Patient left: in bed;with call bell/phone within reach;with bed alarm set;with family/visitor present   Time: [id number] OT Time Calculation (min): 37 min Charges:  OT General Charges $OT Visit: 1 Procedure OT Evaluation $OT Eval Moderate Complexity: 1 Procedure G-Codes:    Malka So 12/27/2015, 9:56 AM

## 2015-12-28 LAB — CBC
HEMATOCRIT: 33.4 % — AB (ref 36.0–46.0)
HEMOGLOBIN: 10.7 g/dL — AB (ref 12.0–15.0)
MCH: 28.2 pg (ref 26.0–34.0)
MCHC: 32 g/dL (ref 30.0–36.0)
MCV: 88.1 fL (ref 78.0–100.0)
Platelets: 243 10*3/uL (ref 150–400)
RBC: 3.79 MIL/uL — ABNORMAL LOW (ref 3.87–5.11)
RDW: 14.4 % (ref 11.5–15.5)
WBC: 5 10*3/uL (ref 4.0–10.5)

## 2015-12-28 LAB — BASIC METABOLIC PANEL
Anion gap: 9 (ref 5–15)
BUN: 12 mg/dL (ref 6–20)
CALCIUM: 8.8 mg/dL — AB (ref 8.9–10.3)
CHLORIDE: 105 mmol/L (ref 101–111)
CO2: 27 mmol/L (ref 22–32)
CREATININE: 0.71 mg/dL (ref 0.44–1.00)
GFR calc non Af Amer: >60 mL/min (ref >60)
Glucose, Bld: 104 mg/dL — ABNORMAL HIGH (ref 65–99)
Potassium: 3.2 mmol/L — ABNORMAL LOW (ref 3.5–5.1)
SODIUM: 141 mmol/L (ref 135–145)

## 2015-12-28 MED ORDER — NITROFURANTOIN MACROCRYSTAL 100 MG PO CAPS: 100.0000 mg | ORAL_CAPSULE | Freq: Four times a day (QID) | ORAL | Status: DC

## 2015-12-28 NOTE — Discharge Summary (Signed)
Physician Discharge Summary  Brittney Gutierrez [id number] DOB: 1927/08/02 DOA: 12/26/2015  PCP: Gildardo Cranker, DO  Admit date: 12/26/2015 Discharge date: 12/28/2015  Time spent: 35 minutes  Recommendations for Outpatient Follow-up:  1. Complete 2 more days macrodantin 100 bid 2. Family aware to call back [facility] for final sensitivities 3. OP follow up Geriatrics 4. Follow up as OP with Neurology  Discharge Diagnoses:  Principal Problem:   Atrial fibrillation with RVR ([place]) Active Problems:   UTI (lower urinary tract infection)   Dementia   Hypokalemia   Demand ischemia [facility])   Discharge Condition: fair  Diet recommendation: regular  Filed Weights   12/27/15 0205  Weight: 59.24 kg (130 lb 9.6 oz)    History of present illness:   82 y/o ? 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 2014 Macular degeneration H/o Neurocysticercosis s/p Cranaiectomy ED, the patient was afebrile, and in A. fib with RVR. She had no leukocytosis, she had mild hypokalemia, and her urinalysis was positive for WBCs, bacteria, and nitrates. A head CT was normal. She was given ceftriaxone and a Cardizem drip was started  She was eventually transitioned to her home meds of Cardizem 240 Cd and Toprol XL25 and because troponin was elevated Cardiology was consulted and patient was recommended only non-invasive work-up and medical management give co-morbids Her Urine grew out a gram neg rod which was pending c/s-Macrodantin covers most bugs in our system so patient was sent back to ALF on this  Family will follow up with Geriatrics and neurology as OP    Discharge Exam: Filed Vitals:   12/28/15 1059 12/28/15 1532  BP: 138/43 150/54  Pulse: 64 68  Temp:  98.2 F (36.8  C)  Resp:  20    General: eomi, ncat Cardiovascular: s1 s2 no m/r/g Respiratory:  Clear no added  Discharge Instructions   Discharge Instructions    Diet - low sodium heart healthy    Complete by:  As directed      Discharge instructions    Complete by:  As directed   Complete 2 days of Macrodantin for Urinary infection Please call back [phone] for final sensitivities  Follow up with Out-patient geriatric Md Follow up with Neurologist in [place]     Increase activity slowly    Complete by:  As directed           Current Discharge Medication List    START taking these medications   Details  nitrofurantoin (MACRODANTIN) 100 MG capsule Take 1 capsule (100 mg total) by mouth 4 (four) times daily. Qty: 4 capsule, Refills: 0      CONTINUE these medications which have NOT CHANGED   Details  acetaminophen (TYLENOL) 325 MG tablet Take 650 mg by mouth every 6 (six) hours as needed for mild pain.    ALPRAZolam (XANAX) 0.5 MG tablet Take 0.5 mg by mouth 2 (two) times daily as needed for anxiety.    aspirin 325 MG tablet Take 325 mg by mouth daily.    diltiazem (DILTIAZEM CD) 240 MG 24 hr capsule Take 240 mg by mouth daily.    metoprolol succinate (TOPROL-XL) 25 MG 24 hr tablet Take 25 mg by mouth daily.    Multiple Vitamins-Minerals (PRESERVISION AREDS 2 PO) Take 1 tablet by mouth 2 (two) times daily.  risperiDONE (RISPERDAL) 0.5 MG tablet Take 0.5 mg by mouth at bedtime.    venlafaxine (EFFEXOR) 37.5 MG tablet Take 37.5 mg by mouth 2 (two) times daily.       Allergies  Allergen Reactions  . Amoxicillin Other (See Comments)    unknown  . Demerol [Meperidine]     See things   Follow-up Information    Follow up with [facility].   Why:  HH-PT/OT/ST have been arranged with [facility]        The results of significant diagnostics from this hospitalization (including imaging, microbiology, ancillary and laboratory) are listed below for  reference.    Significant Diagnostic Studies: Dg Chest 2 View  12/25/2015  CLINICAL DATA:  82 year old female with chest pain EXAM: CHEST  2 VIEW COMPARISON:  None. FINDINGS: Two views of the chest demonstrate emphysematous changes of the lungs. No focal consolidation, pleural effusion, or pneumothorax. The cardiac silhouette is within normal limits. There is osteopenia with chronic appearing fracture deformity of the right clavicle. IMPRESSION: No active cardiopulmonary disease. Electronically Signed   By: Anner Crete M.D.   On: 12/25/2015 04:20   Ct Head Wo Contrast  12/27/2015  CLINICAL DATA:  Hallucinations, altered mental status and confusion for months, recently worsening. History of hypertension and atrial fibrillation. EXAM: CT HEAD WITHOUT CONTRAST TECHNIQUE: Contiguous axial images were obtained from the base of the skull through the vertex without intravenous contrast. COMPARISON:  None. FINDINGS: The ventricles and sulci are normal for age. No intraparenchymal hemorrhage, mass effect nor midline shift. Patchy supratentorial white matter hypodensities are less than expected for patient's age and though non-specific suggest sequelae of chronic small vessel ischemic disease. No acute large vascular territory infarcts. LEFT parietal encephalomalacia associated with old craniotomy. No abnormal extra-axial fluid collections. Basal cisterns are patent. Mild calcific atherosclerosis of the carotid siphons. No skull fracture. The included ocular globes and orbital contents are non-suspicious. Old RIGHT medial orbital blowout fracture. Status post bilateral ocular lens implants. Moderate RIGHT maxillary sinus mucosal thickening air-fluid level associated with wall thickening. IMPRESSION: No acute intracranial process. LEFT parietal encephalomalacia with overlying craniotomy could represent resection cavity old trauma, recommend correlation with surgical history. Involutional changes. Mild to moderate  chronic small vessel ischemic disease. Acute on chronic RIGHT maxillary sinusitis. Electronically Signed   By: Elon Alas M.D.   On: 12/27/2015 00:32    Microbiology: Recent Results (from the past 240 hour(s))  Urine culture     Status: None (Preliminary result)   Collection Time: 12/26/15 11:30 PM  Result Value Ref Range Status   Specimen Description URINE, CATHETERIZED  Final   Special Requests NONE  Final   Culture >=100,000 COLONIES/mL GRAM NEGATIVE RODS  Final   Report Status PENDING  Incomplete  MRSA PCR Screening     Status: None   Collection Time: 12/27/15  2:34 AM  Result Value Ref Range Status   MRSA by PCR NEGATIVE NEGATIVE Final    Comment:        The GeneXpert MRSA Assay (FDA approved for NASAL specimens only), is one component of a comprehensive MRSA colonization surveillance program. It is not intended to diagnose MRSA infection nor to guide or monitor treatment for MRSA infections.      Labs: Basic Metabolic Panel:  Recent Labs Lab 12/25/15 0356 12/26/15 1536 12/27/15 0410 12/28/15 0314  NA 141 139 141 141  K 3.2* 3.0* 3.5 3.2*  CL 106 98* 105 105  CO2 26 28 24  27  GLUCOSE 107* 100* 110* 104*  BUN 10 12 9 12   CREATININE 0.69 0.89 0.70 0.71  CALCIUM 8.8* 9.5 8.5* 8.8*  MG  --  1.8  --   --    Liver Function Tests:  Recent Labs Lab 12/26/15 1536  AST 26  ALT 17  ALKPHOS 101  BILITOT 0.6  PROT 8.2*  ALBUMIN 3.8   No results for input(s): LIPASE, AMYLASE in the last 168 hours. No results for input(s): AMMONIA in the last 168 hours. CBC:  Recent Labs Lab 12/25/15 0356 12/26/15 1536 12/27/15 0410 12/28/15 0314  WBC 5.2 6.0 6.7 5.0  HGB 11.7* 12.9 11.9* 10.7*  HCT 36.1 39.0 36.1 33.4*  MCV 88.0 88.2 88.9 88.1  PLT 230 243 297 243   Cardiac Enzymes:  Recent Labs Lab 12/25/15 0702 12/27/15 0410 12/27/15 0949 12/27/15 1843 12/27/15 2240  TROPONINI <0.03 0.59* 0.81* 0.70* 0.56*   BNP: BNP (last 3 results) No results  for input(s): BNP in the last 8760 hours.  ProBNP (last 3 results) No results for input(s): PROBNP in the last 8760 hours.  CBG:  Recent Labs Lab 12/26/15 2005  GLUCAP 113*       Signed:  Nita Sells MD   [facility] 12/28/2015, 4:04 PM

## 2015-12-28 NOTE — Progress Notes (Signed)
Physical Therapy Treatment Patient Details Name: Brittney Gutierrez MRN: [id number] DOB: 1926/12/23 Today's Date: 12/28/2015    History of Present Illness Pt admitted with AMS and hallucinations, afib with RVR. CT negative for acute changes. PMH: dementia with hallucinations, afib, macular degeneration, HTN, TIA, depression.    PT Comments    Patient progressing well towards PT goals. Seems more steady with UE support during gait training today. Attempted instructing pt in using rollator, brakes and seat, however even with repetition, demonstration and hand over hand, pt not able to problem solve how to use device due to baseline cognitive deficits. Would recommend use of RW for support/safety and family has one at home for her. Pt not happy about this but towards end of session agreeable. Per OT, pt might be going to ALF to get more support at d/c. Will continue to follow per current POC.   Follow Up Recommendations  Home health PT;Supervision for mobility/OOB     Equipment Recommendations  Rolling walker with 5" wheels    Recommendations for Other Services       Precautions / Restrictions Precautions Precautions: Fall Restrictions Weight Bearing Restrictions: No    Mobility  Bed Mobility Overal bed mobility: Modified Independent                Transfers Overall transfer level: Needs assistance Equipment used: None Transfers: Sit to/from Stand Sit to Stand: Supervision         General transfer comment: Supervision for safety. Stood from [employer], from chair x1, from rollator seat x2.  Ambulation/Gait Ambulation/Gait assistance: Min guard Ambulation Distance (Feet): 150 Feet (+ 75' + 125' + 75') Assistive device: Rolling walker (2 wheeled);4-wheeled walker Gait Pattern/deviations: Step-through pattern;Decreased stride length;Drifts right/left;Trunk flexed Gait velocity: decreased   General Gait Details: Ambulated with RW and with rollator. Pt with difficulty  using rollator brakes and seat safely- not able to follow instructions or problem solve how to lock/unlock brakes, "push down" and pt pulling up. Also when standing from seat, trying to walk with rollator behind pt. 2/4 DOE noted.    Stairs            Wheelchair Mobility    Modified Rankin (Stroke Patients Only)       Balance Overall balance assessment: Needs assistance Sitting-balance support: Feet supported;No upper extremity supported Sitting balance-Leahy Scale: Good     Standing balance support: During functional activity Standing balance-Leahy Scale: Fair                      Cognition Arousal/Alertness: Awake/alert Behavior During Therapy: WFL for tasks assessed/performed Overall Cognitive Status: History of cognitive impairments - at baseline       Memory: Decreased short-term memory              Exercises      General Comments General comments (skin integrity, edema, etc.): Daughter present during session. Lengthy discussion about how rollator is not a safe option at this time due to cognitive deficits and safety concerns. Would benefit from use of RW.       Pertinent Vitals/Pain Pain Assessment: No/denies pain    Home Living                      Prior Function            PT Goals (current goals can now be found in the care plan section) Progress towards PT goals: Progressing toward goals    Frequency  Min 3X/week    PT Plan Current plan remains appropriate    Co-evaluation             End of Session Equipment Utilized During Treatment: Gait belt Activity Tolerance: Patient tolerated treatment well Patient left: in bed;with call bell/phone within reach;with bed alarm set;with family/visitor present     Time: [id number] PT Time Calculation (min) (ACUTE ONLY): 28 min  Charges:  $Gait Training: 23-37 mins                    G Codes:      [place] 12/28/2015, 1:08 PM  Wray Kearns, [place],  DPT [phone]

## 2015-12-28 NOTE — Care Management Note (Addendum)
Case Management Note Brittney Gibbons RN, BSN Unit 2W-Case Manager [phone]  Patient Details  Name: Brittney Gutierrez MRN: [id number] Date of Birth: February 26, 1927  Subjective/Objective:  Pt admitted with Afib RVR                  Action/Plan: PTA pt lived at [place] apartment- (she recently relocated here from [place] to be close to family) spoke with pt and daughter at bedside- per conversation pt already has RW and at this time does not want a rollator for home- she and daughter however would like [place] -PT/OT/ST arranged with the in house therapy there at [place] [facility] - call made to [facility] [phone])- spoke with Faith at [facility] there and per conversation with Faith they have already gotten HH-PT/OT orders from pt's PCP on 1/18- they do not need any additional orders from hospital asked if she could get ST added- and she stated she will call PCP and ask for ST- (# for [facility] if needed is [phone] same as fax)- faxed pt's insurance info per request of [facility]- they will plan on starting evals there on Monday.   Expected Discharge Date:   12/28/15               Expected Discharge Plan:  [place]  In-House Referral:     Discharge planning Services  CM Consult  Post Acute Care Choice:  Home Health Choice offered to:  Patient, Adult Children  DME Arranged:    DME Agency:     HH Arranged:  PT, OT, Speech Therapy [place] Agency:  Other - See comment  Status of Service:  Completed, signed off  Medicare Important Message Given:    Date Medicare IM Given:    Medicare IM give by:    Date Additional Medicare IM Given:    Additional Medicare Important Message give by:     If discussed at [place] of Stay Meetings, dates discussed:    Discharge Disposition: Home/home health  Additional Comments:  Dawayne Patricia, RN 12/28/2015, 12:27 PM

## 2015-12-28 NOTE — Progress Notes (Signed)
Occupational Therapy Treatment Patient Details Name: Brittney Gutierrez MRN: [id number] DOB: [birth year] Today's Date: 12/28/2015    History of present illness Pt admitted with AMS and hallucinations, afib with RVR. CT negative for acute changes. PMH: dementia with hallucinations, afib, macular degeneration, HTN, TIA, depression.   OT comments  Pt seeming less frustrated today and steadier on her feet.  Per daughter, family is now considering moving pt from [place] [facility] to their ALF as pt has wandered and demonstrates confusion and paranoia at night.    Follow Up Recommendations  Home health OT    Equipment Recommendations  None recommended by OT    Recommendations for Other Services      Precautions / Restrictions Precautions Precautions: Fall       Mobility Bed Mobility Overal bed mobility: Modified Independent                Transfers   Equipment used: None Transfers: Sit to/from Stand Sit to Stand: Modified independent (Device/Increase time)         General transfer comment: steady from bed, chair and toilet    Balance                                   ADL Overall ADL's : Needs assistance/impaired     Grooming: Wash/dry hands;Supervision/safety;Standing;Brushing hair Grooming Details (indicate cue type and reason): verbal cues to locate soap         Upper Body Dressing : Set up;Sitting Upper Body Dressing Details (indicate cue type and reason): changed soiled gown Lower Body Dressing: Set up;Sitting/lateral leans Lower Body Dressing Details (indicate cue type and reason): donned socks Toilet Transfer: Supervision/safety;Ambulation   Toileting- Clothing Manipulation and Hygiene: Modified independent;Sit to/from stand       Functional mobility during ADLs: Supervision/safety (in the absence of pt's cane) General ADL Comments: Pt much less frustrated today and steadier on her feet.      Vision                      Perception     Praxis      Cognition   Behavior During Therapy: WFL for tasks assessed/performed Overall Cognitive Status: History of cognitive impairments - at baseline       Memory: Decreased short-term memory               Extremity/Trunk Assessment               Exercises     Shoulder Instructions       General Comments      Pertinent Vitals/ Pain       Pain Assessment: No/denies pain  Home Living                                          Prior Functioning/Environment              Frequency Min 2X/week     Progress Toward Goals  OT Goals(current goals can now be found in the care plan section)  Progress towards OT goals: Progressing toward goals     Plan Discharge plan remains appropriate    Co-evaluation                 End of Session     Activity Tolerance  Patient tolerated treatment well   Patient Left in chair;with call bell/phone within reach;with family/visitor present   Nurse Communication          Time: [id number] OT Time Calculation (min): 28 min  Charges: OT General Charges $OT Visit: 1 Procedure OT Treatments $Self Care/Home Management : 23-37 mins  Malka So 12/28/2015, 9:18 AM  [phone]

## 2015-12-28 NOTE — Progress Notes (Signed)
Brittney Gutierrez [id number] DOB: 1927-10-28 DOA: 12/26/2015 PCP: Gildardo Cranker, DO  Brief narrative: 82 y/o ? 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 2014 Macular degeneration H/o Neurocysticercosis s/p Cranaiectomy      ED, the patient was afebrile, and in A. fib with RVR. She had no leukocytosis, she had mild hypokalemia, and her urinalysis was positive for WBCs, bacteria, and nitrates. A head CT was normal. She was given ceftriaxone and a Cardizem drip was started                             Past medical history-As per Problem list Chart reviewed as below-   Consultants:  Cardiology  Procedures:   none  Antibiotics:  Rocephin   Subjective   alert pleasant in nad No pain sundowned last night No n/v Ol diet Family in room   Objective    Interim History: nad  Telemetry:  nsr   Objective: Filed Vitals:   12/27/15 2008 12/28/15 0450 12/28/15 1055 12/28/15 1059  BP: 148/61 149/56 138/43 138/43  Pulse: 65 70  64  Temp: 98.5 F (36.9 C) 98.7 F (37.1 C)    TempSrc: Oral Oral    Resp: 20 18    Weight:      SpO2: 96% 97%      Intake/Output Summary (Last 24 hours) at 12/28/15 1437 Last data filed at 12/28/15 1100  Gross per 24 hour  Intake    483 ml  Output      0 ml  Net    483 ml    Exam:  HEENT alrt but disoriented and attempting to name place without success CHEST clear no added sound CARDIAC s1 s2 RRR ABDOMEN soft nt nd no rebound no gaurd NEURO power 5/5, finger-nose finger intact, reflexes intact   Data Reviewed: Basic Metabolic Panel:  Recent Labs Lab 12/25/15 0356 12/26/15 1536 12/27/15 0410 12/28/15 0314  NA 141 139 141 141  K 3.2* 3.0* 3.5 3.2*  CL 106 98* 105 105  CO2 26 28 24 27   GLUCOSE 107* 100* 110* 104*   BUN 10 12 9 12   CREATININE 0.69 0.89 0.70 0.71  CALCIUM 8.8* 9.5 8.5* 8.8*  MG  --  1.8  --   --    Liver Function Tests:  Recent Labs Lab 12/26/15 1536  AST 26  ALT 17  ALKPHOS 101  BILITOT 0.6  PROT 8.2*  ALBUMIN 3.8   No results for input(s): LIPASE, AMYLASE in the last 168 hours. No results for input(s): AMMONIA in the last 168 hours. CBC:  Recent Labs Lab 12/25/15 0356 12/26/15 1536 12/27/15 0410 12/28/15 0314  WBC 5.2 6.0 6.7 5.0  HGB 11.7* 12.9 11.9* 10.7*  HCT 36.1 39.0 36.1 33.4*  MCV 88.0 88.2 88.9 88.1  PLT 230 243 297 243   Cardiac Enzymes:  Recent Labs Lab 12/25/15 0702 12/27/15 0410 12/27/15 0949 12/27/15 1843 12/27/15 2240  TROPONINI <0.03 0.59* 0.81* 0.70* 0.56*   BNP: Invalid input(s): POCBNP CBG:  Recent Labs Lab 12/26/15 2005  GLUCAP 113*    Recent Results (from the past 240 hour(s))  Urine culture     Status: None (Preliminary result)   Collection Time: 12/26/15 11:30 PM  Result Value Ref Range Status  Specimen Description URINE, CATHETERIZED  Final   Special Requests NONE  Final   Culture >=100,000 COLONIES/mL GRAM NEGATIVE RODS  Final   Report Status PENDING  Incomplete  MRSA PCR Screening     Status: None   Collection Time: 12/27/15  2:34 AM  Result Value Ref Range Status   MRSA by PCR NEGATIVE NEGATIVE Final    Comment:        The GeneXpert MRSA Assay (FDA approved for NASAL specimens only), is one component of a comprehensive MRSA colonization surveillance program. It is not intended to diagnose MRSA infection nor to guide or monitor treatment for MRSA infections.      Studies:              All Imaging reviewed and is as per above notation   Scheduled Meds: . aspirin  325 mg Oral Daily  . cefTRIAXone (ROCEPHIN)  IV  1 g Intravenous Q24H  . diltiazem  240 mg Oral Daily  . enoxaparin (LOVENOX) injection  40 mg Subcutaneous Q24H  . metoprolol succinate  25 mg Oral Daily  . polyethylene glycol  17 g Oral  Daily  . risperiDONE  0.5 mg Oral QHS  . sodium chloride  3 mL Intravenous Q12H  . venlafaxine  37.5 mg Oral BID   Continuous Infusions: . diltiazem (CARDIZEM) infusion 5 mg/hr (12/27/15 0732)     Assessment/Plan:  1. Afib rvr-2/2 to not taking meds.  Resumed Cardizem 240 CD and toprol xl 25.  HR  controlled off Dilt Gtt.  Would continue ASA 325 only for now.  appreciate Cardiology inputn 2. Elevated troponin-Peak so far 0.81.  ? 2/2 to demand ischemia.  Cardiology consult appreciated. 3. Pyelonephritis-Gram Neg rods-continue Rocephin for now.  Narrow in am to PO Macrodantin dependant on susceptibility 4. Dementia with hallucination-Will need ALF level care.  Family interested in PT/Ot.  Discussed with family no known cures.  Should follow up with Neurologist who saw her in [place] last week and put her on Risperdal.  Long-term outlook re: dementia irrespective of cause discussed with family who express good understanding.   Updated daughter Likely d/c am  Verneita Griffes, MD  [facility] Pager [phone] 12/28/2015, 2:37 PM    LOS: 1 day

## 2015-12-28 NOTE — Progress Notes (Signed)
Pt had 6 second burst of afib. Rhythm returned to normal sinus in 60s and patient was sleeping during event.   Will continue to monitor.   Earlie Lou

## 2015-12-29 LAB — URINE CULTURE: Culture: >=100000

## 2016-01-03 DIAGNOSIS — R488 Other symbolic dysfunctions: Secondary | ICD-10-CM | POA: Diagnosis not present

## 2016-01-05 DIAGNOSIS — R488 Other symbolic dysfunctions: Secondary | ICD-10-CM | POA: Diagnosis not present

## 2016-01-08 DIAGNOSIS — R2681 Unsteadiness on feet: Secondary | ICD-10-CM | POA: Diagnosis not present

## 2016-01-08 DIAGNOSIS — R41841 Cognitive communication deficit: Secondary | ICD-10-CM | POA: Diagnosis not present

## 2016-01-08 DIAGNOSIS — R278 Other lack of coordination: Secondary | ICD-10-CM | POA: Diagnosis not present

## 2016-01-08 DIAGNOSIS — M6281 Muscle weakness (generalized): Secondary | ICD-10-CM | POA: Diagnosis not present

## 2016-01-08 DIAGNOSIS — R488 Other symbolic dysfunctions: Secondary | ICD-10-CM | POA: Diagnosis not present

## 2016-01-09 DIAGNOSIS — R41841 Cognitive communication deficit: Secondary | ICD-10-CM | POA: Diagnosis not present

## 2016-01-09 DIAGNOSIS — M6281 Muscle weakness (generalized): Secondary | ICD-10-CM | POA: Diagnosis not present

## 2016-01-09 DIAGNOSIS — R278 Other lack of coordination: Secondary | ICD-10-CM | POA: Diagnosis not present

## 2016-01-09 DIAGNOSIS — R488 Other symbolic dysfunctions: Secondary | ICD-10-CM | POA: Diagnosis not present

## 2016-01-09 DIAGNOSIS — R2681 Unsteadiness on feet: Secondary | ICD-10-CM | POA: Diagnosis not present

## 2016-01-10 DIAGNOSIS — R41841 Cognitive communication deficit: Secondary | ICD-10-CM | POA: Diagnosis not present

## 2016-01-10 DIAGNOSIS — M6281 Muscle weakness (generalized): Secondary | ICD-10-CM | POA: Diagnosis not present

## 2016-01-10 DIAGNOSIS — R278 Other lack of coordination: Secondary | ICD-10-CM | POA: Diagnosis not present

## 2016-01-10 DIAGNOSIS — R488 Other symbolic dysfunctions: Secondary | ICD-10-CM | POA: Diagnosis not present

## 2016-01-10 DIAGNOSIS — R2681 Unsteadiness on feet: Secondary | ICD-10-CM | POA: Diagnosis not present

## 2016-01-11 DIAGNOSIS — R278 Other lack of coordination: Secondary | ICD-10-CM | POA: Diagnosis not present

## 2016-01-11 DIAGNOSIS — R488 Other symbolic dysfunctions: Secondary | ICD-10-CM | POA: Diagnosis not present

## 2016-01-11 DIAGNOSIS — R2681 Unsteadiness on feet: Secondary | ICD-10-CM | POA: Diagnosis not present

## 2016-01-11 DIAGNOSIS — M6281 Muscle weakness (generalized): Secondary | ICD-10-CM | POA: Diagnosis not present

## 2016-01-11 DIAGNOSIS — R41841 Cognitive communication deficit: Secondary | ICD-10-CM | POA: Diagnosis not present

## 2016-01-12 ENCOUNTER — Encounter: Payer: Self-pay | Admitting: Cardiovascular Disease

## 2016-01-12 ENCOUNTER — Ambulatory Visit (INDEPENDENT_AMBULATORY_CARE_PROVIDER_SITE_OTHER): Payer: Medicare Other | Admitting: Cardiovascular Disease

## 2016-01-12 VITALS — BP 136/84 | HR 72 | Ht 62.0 in | Wt 133.4 lb

## 2016-01-12 DIAGNOSIS — I2721 Secondary pulmonary arterial hypertension: Secondary | ICD-10-CM

## 2016-01-12 DIAGNOSIS — R41841 Cognitive communication deficit: Secondary | ICD-10-CM | POA: Diagnosis not present

## 2016-01-12 DIAGNOSIS — R2681 Unsteadiness on feet: Secondary | ICD-10-CM | POA: Diagnosis not present

## 2016-01-12 DIAGNOSIS — R001 Bradycardia, unspecified: Secondary | ICD-10-CM | POA: Diagnosis not present

## 2016-01-12 DIAGNOSIS — I071 Rheumatic tricuspid insufficiency: Secondary | ICD-10-CM

## 2016-01-12 DIAGNOSIS — I4891 Unspecified atrial fibrillation: Secondary | ICD-10-CM

## 2016-01-12 DIAGNOSIS — R488 Other symbolic dysfunctions: Secondary | ICD-10-CM | POA: Diagnosis not present

## 2016-01-12 DIAGNOSIS — I351 Nonrheumatic aortic (valve) insufficiency: Secondary | ICD-10-CM | POA: Diagnosis not present

## 2016-01-12 DIAGNOSIS — I340 Nonrheumatic mitral (valve) insufficiency: Secondary | ICD-10-CM

## 2016-01-12 DIAGNOSIS — M6281 Muscle weakness (generalized): Secondary | ICD-10-CM | POA: Diagnosis not present

## 2016-01-12 DIAGNOSIS — I248 Other forms of acute ischemic heart disease: Secondary | ICD-10-CM | POA: Diagnosis not present

## 2016-01-12 DIAGNOSIS — I10 Essential (primary) hypertension: Secondary | ICD-10-CM

## 2016-01-12 DIAGNOSIS — R278 Other lack of coordination: Secondary | ICD-10-CM | POA: Diagnosis not present

## 2016-01-12 MED ORDER — METOPROLOL SUCCINATE ER 50 MG PO TB24: 50.0000 mg | ORAL_TABLET | Freq: Every day | ORAL | Status: DC

## 2016-01-12 NOTE — Progress Notes (Signed)
Patient ID: Brittney Gutierrez, female   DOB: Jan 08, 1927, 82 y.o.   MRN: [id number]    Cardiology Office Note    Date:  01/14/2016   ID:  Brittney Gutierrez, DOB 05-05-1927, MRN [id number]  PCP:  Gildardo Cranker, DO  Cardiologist:   Sanda Klein, MD   Chief Complaint  Patient presents with  . New Patient (Initial Visit)     no chest pain, no shortness of breath, no edema, no pain or cramping in legs, no lightheadedness or dizziness    History of Present Illness:  Brittney Gutierrez is a 82 y.o. female who has recently moved to the [place] area from [place] where she was being cared for by Dr. Chancy Milroy. In September 2016, she was hospitalized with chest discomfort in the setting of atrial fibrillation with rapid ventricular response and had a very minor increase in cardiac enzymes (troponin 0.2). She converted spontaneously to sinus rhythm.   While she had rapid rates she expressed chest tightness and flushing, but was never aware of palpitations. She denies syncope or near-syncope.  She was reportedly difficult to rate control and had significant bradycardia after conversion to sinus rhythm. Her electrocardiogram showed a heart rate of 52 bpm and a Holter monitor showed a minimum heart rate of 45 bpm and an average heart rate of 60 bpm as well as multiple episodes of paroxysmal atrial fibrillation with rates in the 160s while on treatment with metoprolol succinate 25 mg daily and diltiazem 240 mg daily. The Holter monitor tracings are not available for review. The report shows that the longest episode of "paroxysmal atrial fibrillation" on the monitor was only 11 beats in length. The minimum heart rate occurred at 2 AM during expected sleep time.  A pacemaker was recommended for tachycardia-bradycardia syndrome, but the patient's family declined.  Patient is felt to be a fall risk and therefore was prescribed aspirin rather than full anticoagulation. She is quite unsteady on her feet, but denies  dizziness as a cause for her falls. She "stumbles" when she walks. The family was concerned that diltiazem might be worsening visual hallucinations. "Transient ischemic attack" is listed in her electronic medical record, but the patient and family do not have any recollection of such an event.  Follow-up nuclear stress test in September 2016 did not show perfusion abnormalities and the ejection fraction was 65%. Her echo showed an ejection fraction of 60-65%, moderate aortic insufficiency, moderate mitral insufficiency, moderate tricuspid insufficiency and "mild to moderate pulmonary hypertension" (the tricuspid pressure gradient was only 39 mmHg, right atrial pressure was not estimated). The left atrium was mildly enlarged(the diameter is measured as normal at 3.2 cm)    Past Medical History  Diagnosis Date  . Paroxysmal a-fib ([place])   . Hypertension   . Formed visual hallucinations     [name]?  Failed Keppra and Depakote  . Macular degeneration   . Diverticulitis   . TIA (transient ischemic attack)   . Neurocysticercosis     Craniotomy at [facility] in early 2000s  . Depression   . UTI (urinary tract infection)   . Paranoia [facility])     Past Surgical History  Procedure Laterality Date  . Shoulder surgery Right   . Knee surgery Left 1990  . Brain surgery  2000    Neurocysticercosis  . Vaginal hysterectomy  1960  . Rotator cuff repair  2000  . Cataract extraction  2013    Outpatient Prescriptions Prior to Visit  Medication Sig Dispense Refill  . acetaminophen (  TYLENOL) 325 MG tablet Take 650 mg by mouth every 6 (six) hours as needed for mild pain.    [name] ALPRAZolam (XANAX) 0.5 MG tablet Take 0.5 mg by mouth 2 (two) times daily as needed for anxiety.    [name] aspirin 325 MG tablet Take 325 mg by mouth daily.    [name] diltiazem (DILTIAZEM CD) 240 MG 24 hr capsule Take 240 mg by mouth daily.    . Multiple Vitamins-Minerals (PRESERVISION AREDS 2 PO) Take 1 tablet by mouth 2 (two) times  daily.     . nitrofurantoin (MACRODANTIN) 100 MG capsule Take 1 capsule (100 mg total) by mouth 4 (four) times daily. 4 capsule 0  . risperiDONE (RISPERDAL) 0.5 MG tablet Take 0.5 mg by mouth at bedtime.    [name] venlafaxine (EFFEXOR) 37.5 MG tablet Take 37.5 mg by mouth 2 (two) times daily.    . metoprolol succinate (TOPROL-XL) 25 MG 24 hr tablet Take 25 mg by mouth daily.     No facility-administered medications prior to visit.     Allergies:   Amoxicillin and Demerol   Social History   Social History  . Marital Status: Widowed    Spouse Name: N/A  . Number of Children: N/A  . Years of Education: N/A   Social History Main Topics  . Smoking status: Never Smoker   . Smokeless tobacco: None  . Alcohol Use: No     Comment: social  . Drug Use: No  . Sexual Activity: Not Asked   Other Topics Concern  . None   Social History Narrative   DIET: None      DO YOU DRINK/EAT THINGS WITH CAFFEINE: yes      MARITAL STATUS: widow      WHAT YEAR WERE YOU MARRIED: 1948      DO YOU LIVE IN A HOUSE, APARTMENT, ASSISTED LIVING, CONDO TRAILER ETC.: Independent Living      IS IT ONE OR MORE STORIES: 1 story      HOW MANY PERSONS LIVE IN YOUR HOME: 1      DO YOU HAVE PETS IN YOUR HOME: no      CURRENT OR PAST PROFESSION: Book Keeper      DO YOU EXERCISE: no      WHAT TYPE AND HOW OFTEN:     Family History:  The patient's family history includes Heart attack in her father; Hypertension in her sister; Polycystic kidney disease in her mother. There is no history of Dementia.   ROS:   Please see the history of present illness.    ROS All other systems reviewed and are negative.   PHYSICAL EXAM:   VS:  BP 136/84 mmHg  Pulse 72  Ht 5\' 2"  (1.575 m)  Wt 60.527 kg (133 lb 7 oz)  BMI 24.40 kg/m2   GEN: Well nourished, well developed, in no acute distress HEENT: normal Neck: no JVD, carotid bruits, or masses Cardiac: RRR; grade 1-2/6 early peaking crescendo-decrescendo systolic  murmur heard exclusively in the aortic focus, no apical systolic murmur, no diastolic murmurs, rubs, or gallops,no edema  Respiratory:  clear to auscultation bilaterally, normal work of breathing GI: soft, nontender, nondistended, + BS MS: no deformity or atrophy Skin: warm and dry, no rash Neuro:  Alert and Oriented x 3, Strength and sensation are intact Psych: euthymic mood, full affect  Wt Readings from Last 3 Encounters:  01/12/16 60.527 kg (133 lb 7 oz)  12/28/15 54.432 kg (120 lb)  Studies/Labs Reviewed:   EKG:  EKG is ordered today.  The ekg ordered today demonstrates normal sinus rhythm in the 70s, changes of left ventricular hypertrophy  Recent Labs: 12/26/2015: ALT 17; Magnesium 1.8 12/28/2015: BUN 12; Creatinine, Ser 0.71; Hemoglobin 10.7*; Platelets 243; Potassium 3.2*; Sodium 141   Lipid Panel No results found for: CHOL, TRIG, HDL, CHOLHDL, VLDL, LDLCALC, LDLDIRECT  Additional studies/ records that were reviewed today include:  Records from [place]:    1. Atrial fibrillation with RVR ([place])   2. Demand ischemia ([place])   3. Sinus bradycardia seen on cardiac monitor   4. Essential hypertension   5. PAH (pulmonary artery hypertension) ([place])   6. Aortic insufficiency   7. Mitral insufficiency   8. Tricuspid insufficiency      PLAN:  In order of problems listed above:  1. AFib: This is not currently symptomatic. She did not have any sustained arrhythmia while she wore the monitor. It is unclear how frequently episodes of atrial fibrillation actually occur. Clearly, the medications that she currently takes were insufficient for rate control. I advised increasing the dose of beta blocker. If she develops symptomatic bradycardia, clearly the only good option is pacemaker implantation. We actually spent at least 10-15 minutes going over the purpose of pacemaker therapy, the pros and cons of implanting the device, possible complications, long-term  follow-up, etc. I don't think she is a good candidate for ablation or antiarrhythmic therapy. Having said that, the major goal here is symptom relief and not perfect arrhythmia suppression. Her risk of embolic stroke is substantial.CHADSVasc score is at least 4 (age 17, HTN, gender) and may be 6 (idf she indeed had a TIA. Since the risk/benefit of full anticoagulation with her family and some more detail. At this point she is felt to be a high fall risk. She clearly has some early dementia. If we decide to go with full anticoagulation, warfarin would be a inferior choice. She should take a direct oral anticoagulant. 2. Minimum increase in troponin level during tachycardia, normal nuclear perfusion study.  3. Bradycardia: The most significant bradycardic events occurred at night, which was likely asleep. I don't think they prevent [place] from trying to slightly increase her dose of beta blocker. She does not have any symptoms of bradycardia. 4. HTN: Satisfactory control 5. PAH: The notes report severe pulmonary hypertension, but the actual echo report shows mild-moderate pulmonary hypertension, most likely related to left heart disease. 6. AI: The notes report "moderate aortic insufficiency" but the actual echo report shows mild aortic insufficiency, unlikely to be clinically significant. I do not hear the murmur of aortic insufficiency on exam 7. MR: Again, notes report "moderate mitral insufficiency" but the actual report shows mild to moderate mitral insufficiency, unlikely to be significant. I do not hear the murmur of mitral insufficiency on exam.    Medication Adjustments/Labs and Tests Ordered: Current medicines are reviewed at length with the patient today.  Concerns regarding medicines are outlined above.  Medication changes, Labs and Tests ordered today are listed in the Patient Instructions below. Patient Instructions  Your physician has recommended you make the following change in your medication:    INCREASE METOPROLOL ER TO 50 MG DAILY  Dr. Sallyanne Kuster recommends that you schedule a follow-up appointment in: [place], Shardai Star, MD  01/14/2016 12:42 PM    [address], [place], [place]  [address] Phone: [phone]; Fax: [phone]

## 2016-01-12 NOTE — Patient Instructions (Signed)
Your physician has recommended you make the following change in your medication: INCREASE METOPROLOL ER TO 50MG DAILY  Dr. Croitoru recommends that you schedule a follow-up appointment in: 3 MONTHS    

## 2016-01-14 DIAGNOSIS — I351 Nonrheumatic aortic (valve) insufficiency: Secondary | ICD-10-CM | POA: Insufficient documentation

## 2016-01-14 DIAGNOSIS — I10 Essential (primary) hypertension: Secondary | ICD-10-CM | POA: Insufficient documentation

## 2016-01-14 DIAGNOSIS — I2721 Secondary pulmonary arterial hypertension: Secondary | ICD-10-CM | POA: Insufficient documentation

## 2016-01-15 DIAGNOSIS — R41841 Cognitive communication deficit: Secondary | ICD-10-CM | POA: Diagnosis not present

## 2016-01-15 DIAGNOSIS — R2681 Unsteadiness on feet: Secondary | ICD-10-CM | POA: Diagnosis not present

## 2016-01-15 DIAGNOSIS — R488 Other symbolic dysfunctions: Secondary | ICD-10-CM | POA: Diagnosis not present

## 2016-01-15 DIAGNOSIS — R278 Other lack of coordination: Secondary | ICD-10-CM | POA: Diagnosis not present

## 2016-01-15 DIAGNOSIS — M6281 Muscle weakness (generalized): Secondary | ICD-10-CM | POA: Diagnosis not present

## 2016-01-16 ENCOUNTER — Encounter: Payer: Self-pay | Admitting: Nurse Practitioner

## 2016-01-16 ENCOUNTER — Ambulatory Visit (INDEPENDENT_AMBULATORY_CARE_PROVIDER_SITE_OTHER): Payer: Medicare Other | Admitting: Nurse Practitioner

## 2016-01-16 VITALS — BP 150/72 | HR 59 | Temp 97.6°F | Resp 20 | Ht 61.5 in | Wt 134.6 lb

## 2016-01-16 DIAGNOSIS — G4700 Insomnia, unspecified: Secondary | ICD-10-CM | POA: Diagnosis not present

## 2016-01-16 DIAGNOSIS — I248 Other forms of acute ischemic heart disease: Secondary | ICD-10-CM

## 2016-01-16 DIAGNOSIS — R413 Other amnesia: Secondary | ICD-10-CM | POA: Diagnosis not present

## 2016-01-16 DIAGNOSIS — H353213 Exudative age-related macular degeneration, right eye, with inactive scar: Secondary | ICD-10-CM | POA: Diagnosis not present

## 2016-01-16 DIAGNOSIS — H353221 Exudative age-related macular degeneration, left eye, with active choroidal neovascularization: Secondary | ICD-10-CM | POA: Diagnosis not present

## 2016-01-16 DIAGNOSIS — R300 Dysuria: Secondary | ICD-10-CM

## 2016-01-16 DIAGNOSIS — K219 Gastro-esophageal reflux disease without esophagitis: Secondary | ICD-10-CM | POA: Diagnosis not present

## 2016-01-16 DIAGNOSIS — F329 Major depressive disorder, single episode, unspecified: Secondary | ICD-10-CM

## 2016-01-16 DIAGNOSIS — I480 Paroxysmal atrial fibrillation: Secondary | ICD-10-CM

## 2016-01-16 DIAGNOSIS — F419 Anxiety disorder, unspecified: Secondary | ICD-10-CM

## 2016-01-16 DIAGNOSIS — F418 Other specified anxiety disorders: Secondary | ICD-10-CM | POA: Diagnosis not present

## 2016-01-16 DIAGNOSIS — I10 Essential (primary) hypertension: Secondary | ICD-10-CM | POA: Diagnosis not present

## 2016-01-16 DIAGNOSIS — H43813 Vitreous degeneration, bilateral: Secondary | ICD-10-CM | POA: Diagnosis not present

## 2016-01-16 LAB — POCT URINALYSIS DIPSTICK
BILIRUBIN UA: NEGATIVE
Blood, UA: NEGATIVE
Glucose, UA: NEGATIVE
KETONES UA: NEGATIVE
Nitrite, UA: POSITIVE
PH UA: 7.5
Protein, UA: NEGATIVE
Spec Grav, UA: 1.01
Urobilinogen, UA: 0.2

## 2016-01-16 NOTE — Addendum Note (Signed)
Addended by: Janett Labella A on: 01/16/2016 07:43 AM   Modules accepted: Orders

## 2016-01-16 NOTE — Progress Notes (Signed)
Patient ID: Brittney Gutierrez, female   DOB: November 05, 1927, 82 y.o.   MRN: [id number]    PCP: Lauree Chandler, NP  Advanced Directive information Does patient have an advance directive?: Yes, Type of Advance Directive: [place], Does patient want to make changes to advanced directive?: No - Patient declined  Allergies  Allergen Reactions  . Amoxicillin Other (See Comments)    Unknown- ? Upset stomach  . Demerol [Meperidine]     See things    Chief Complaint  Patient presents with  . Establish Care    New Patient, establish care.  . Medical Management of Chronic Issues  . OTHER    both daughter in room with patient     HPI: Patient is a 82 y.o. female seen in the office today to establish care. Daughters in room with pt. Pt with hx of dementia with behaviors, a fib, htn, macular degeneration, TIA, anxiety, insomnia  Pt previously living in [place], now at [facility]. In independent living.  Following with neurologist, cardiologist and ophthalmology Saw cardiologist 2 days ago.  Recent UTI, feels like she is burning now. Drinking plenty of water. Has gone years without UTI previously.  Pt with increase hallucinations and paranoia. Started seeing bluish imagines and then people last fall. People coming into her home. In December she was running up and down the hall because she thought someone was taking things.  Started seeing neurologist who started her on Risperdal which has helped.  Pt is aware she is having hallucinations at time. Does sundown.  Has been taking xanax for many years at night, can not go without this in the evening. Cut back on xanax when she started Risperdal.   Review of Systems:  Review of Systems  Constitutional: Negative for activity change, appetite change, fatigue and unexpected weight change.  HENT: Negative for congestion and hearing loss.   Eyes: Negative.   Respiratory: Negative for cough and shortness of breath.     Cardiovascular: Negative for chest pain, palpitations and leg swelling.  Gastrointestinal: Positive for constipation. Negative for abdominal pain and diarrhea.       Rare GERD symptoms   Genitourinary: Negative for dysuria and difficulty urinating.  Musculoskeletal: Negative for myalgias and arthralgias.  Skin: Negative for color change and wound.  Neurological: Negative for dizziness and weakness.  Psychiatric/Behavioral: Positive for hallucinations, behavioral problems, confusion, sleep disturbance and agitation. The patient is nervous/anxious.     Past Medical History  Diagnosis Date  . Paroxysmal a-fib ([place])   . Hypertension   . Formed visual hallucinations     [name]?  Failed Keppra and Depakote  . Macular degeneration   . Diverticulitis   . TIA (transient ischemic attack)   . Neurocysticercosis     C[facility]iotomy at [facility] in early 2000s  . Depression   . UTI (urinary tract infection)   . Paranoia [facility])    Past Surgical History  Procedure Laterality Date  . Shoulder surgery Right   . Knee surgery Left 1990  . Brain surgery  2000    Neurocysticercosis  . Vaginal hysterectomy  1960  . Rotator cuff repair  2000  . Cataract extraction  2013   Social History:   reports that she has never smoked. She has never used smokeless tobacco. She reports that she does not drink alcohol or use illicit drugs.  Family History  Problem Relation Age of Onset  . Dementia Neg Hx   . Polycystic kidney disease Mother   .  Heart attack Father   . Hypertension Sister     Medications: Patient's Medications  New Prescriptions   No medications on file  Previous Medications   ACETAMINOPHEN (TYLENOL) 325 MG TABLET    Take 650 mg by mouth every 6 (six) hours as needed for mild pain.   ALPRAZOLAM (XANAX) 0.5 MG TABLET    Take 0.5 mg by mouth 2 (two) times daily as needed for anxiety.   ASPIRIN 325 MG TABLET    Take 325 mg by mouth daily.   DILTIAZEM (DILTIAZEM CD) 240 MG 24 HR  CAPSULE    Take 240 mg by mouth daily.   METOPROLOL SUCCINATE (TOPROL-XL) 50 MG 24 HR TABLET    Take 1 tablet daily. Take with or immediately following a meal.   MULTIPLE VITAMINS-MINERALS (PRESERVISION AREDS 2 PO)    Take 1 tablet by mouth 2 (two) times daily.    RISPERIDONE (RISPERDAL) 0.5 MG TABLET    Take 0.5 mg by mouth at bedtime.   VENLAFAXINE (EFFEXOR) 37.5 MG TABLET    Take 37.5 mg by mouth 2 (two) times daily.  Modified Medications   No medications on file  Discontinued Medications   METOPROLOL SUCCINATE (TOPROL-XL) 50 MG 24 HR TABLET    Take 1 tablet (50 mg total) by mouth daily. Take with or immediately following a meal.   NITROFURANTOIN (MACRODANTIN) 100 MG CAPSULE    Take 1 capsule (100 mg total) by mouth 4 (four) times daily.     Physical Exam:  Filed Vitals:   01/16/16 0850  BP: 150/72  Pulse: 59  Temp: 97.6 F (36.4 C)  TempSrc: Oral  Resp: 20  Height: 5' 1.5" (1.562 m)  Weight: 134 lb 9.6 oz (61.054 kg)  SpO2: 96%   Body mass index is 25.02 kg/(m^2).  Physical Exam  Constitutional: She is oriented to person, place, and time. She appears well-developed and well-nourished. No distress.  HENT:  Head: Normocephalic and atraumatic.  Mouth/Throat: Oropharynx is clear and moist. No oropharyngeal exudate.  Eyes: Conjunctivae are normal. Pupils are equal, round, and reactive to light.  Neck: Normal range of motion. Neck supple.  Cardiovascular: Normal rate, regular rhythm and normal heart sounds.   Pulmonary/Chest: Effort normal and breath sounds normal.  Abdominal: Soft. Bowel sounds are normal.  Musculoskeletal: She exhibits no edema or tenderness.  Neurological: She is alert and oriented to person, place, and time.  Skin: Skin is warm and dry. She is not diaphoretic.  Psychiatric: She has a normal mood and affect.    Labs reviewed: Basic Metabolic Panel:  Recent Labs  12/26/15 1536 12/27/15 0410 12/28/15 0314  NA 139 141 141  K 3.0* 3.5 3.2*  CL 98*  105 105  CO2 28 24 27   GLUCOSE 100* 110* 104*  BUN 12 9 12   CREATININE 0.89 0.70 0.71  CALCIUM 9.5 8.5* 8.8*  MG 1.8  --   --    Liver Function Tests:  Recent Labs  12/26/15 1536  AST 26  ALT 17  ALKPHOS 101  BILITOT 0.6  PROT 8.2*  ALBUMIN 3.8   No results for input(s): LIPASE, AMYLASE in the last 8760 hours. No results for input(s): AMMONIA in the last 8760 hours. CBC:  Recent Labs  12/26/15 1536 12/27/15 0410 12/28/15 0314  WBC 6.0 6.7 5.0  HGB 12.9 11.9* 10.7*  HCT 39.0 36.1 33.4*  MCV 88.2 88.9 88.1  PLT 243 297 243   Lipid Panel: No results for input(s): CHOL, HDL, LDLCALC,  TRIG, CHOLHDL, LDLDIRECT in the last 8760 hours. TSH: No results for input(s): TSH in the last 8760 hours. A1C: No results found for: HGBA1C   Assessment/Plan .1. Essential hypertension Slightly elevated today, will cont current regimen and monitor, may need another antihypertensive medication added.   2. Paroxysmal atrial fibrillation (HCC) Rate controlled, conts on cardizem, toprol XL and ASA  3. Anxiety and depression -conts on effexor twice daily, family questions if this is helping at this point, may attempt to try different medication in the future.  -conts xanax as needed  4. Gastroesophageal reflux disease without esophagitis - may use OTC tums as needed for GERD, avoiding foods that worsen symptoms   5. Memory loss -will get MMSE at next visit, currently not on any dementia related medication   6. Dysuria -will follow up UA C&S, encouraged hydration and proper pericare - Culture, Urine  7. Insomnia -using xanax for sleep, discouraged against this. May try melatonin qhs as needed   Follow up in 2 weeks for Physical   Jasper Hanf K. Harle Battiest  [facility] [phone] 8 am - 5 pm) [phone] (after hours)

## 2016-01-16 NOTE — Patient Instructions (Signed)
May use OTC medication such as colace 100 mg daily for constipation  May use TUMS as needed for acid reflux  Will check potassium with lab work today and Urine for infection   May use melatonin 3-6 mg for sleep   Follow up in 2-3 weeks for Annual exam

## 2016-01-17 DIAGNOSIS — M6281 Muscle weakness (generalized): Secondary | ICD-10-CM | POA: Diagnosis not present

## 2016-01-17 DIAGNOSIS — R488 Other symbolic dysfunctions: Secondary | ICD-10-CM | POA: Diagnosis not present

## 2016-01-17 DIAGNOSIS — R278 Other lack of coordination: Secondary | ICD-10-CM | POA: Diagnosis not present

## 2016-01-17 DIAGNOSIS — R2681 Unsteadiness on feet: Secondary | ICD-10-CM | POA: Diagnosis not present

## 2016-01-17 DIAGNOSIS — R41841 Cognitive communication deficit: Secondary | ICD-10-CM | POA: Diagnosis not present

## 2016-01-17 LAB — COMPREHENSIVE METABOLIC PANEL
A/G RATIO: 1.2 (ref 1.1–2.5)
ALBUMIN: 3.9 g/dL (ref 3.5–4.7)
ALT: 15 IU/L (ref 0–32)
AST: 14 IU/L (ref 0–40)
Alkaline Phosphatase: 114 IU/L (ref 39–117)
BUN/Creatinine Ratio: 25 (ref 11–26)
BUN: 18 mg/dL (ref 8–27)
CO2: 22 mmol/L (ref 18–29)
Calcium: 9.3 mg/dL (ref 8.7–10.3)
Chloride: 98 mmol/L (ref 96–106)
Creatinine, Ser: 0.73 mg/dL (ref 0.57–1.00)
GFR, EST AFRICAN AMERICAN: 86 mL/min/{1.73_m2} (ref >59)
GFR, EST NON AFRICAN AMERICAN: 75 mL/min/{1.73_m2} (ref >59)
Globulin, Total: 3.2 g/dL (ref 1.5–4.5)
Glucose: 80 mg/dL (ref 65–99)
POTASSIUM: 4.3 mmol/L (ref 3.5–5.2)
Sodium: 139 mmol/L (ref 134–144)
TOTAL PROTEIN: 7.1 g/dL (ref 6.0–8.5)

## 2016-01-17 LAB — CBC WITH DIFFERENTIAL/PLATELET
BASOS: 0 %
Basophils Absolute: 0 10*3/uL (ref 0.0–0.2)
EOS (ABSOLUTE): 0.2 10*3/uL (ref 0.0–0.4)
EOS: 5 %
HEMATOCRIT: 35.9 % (ref 34.0–46.6)
HEMOGLOBIN: 12.2 g/dL (ref 11.1–15.9)
IMMATURE GRANS (ABS): 0 10*3/uL (ref 0.0–0.1)
Immature Granulocytes: 0 %
LYMPHS: 20 %
Lymphocytes Absolute: 1 10*3/uL (ref 0.7–3.1)
MCH: 29.5 pg (ref 26.6–33.0)
MCHC: 34 g/dL (ref 31.5–35.7)
MCV: 87 fL (ref 79–97)
MONOCYTES: 12 %
Monocytes Absolute: 0.6 10*3/uL (ref 0.1–0.9)
NEUTROS ABS: 3.1 10*3/uL (ref 1.4–7.0)
Neutrophils: 63 %
Platelets: 266 10*3/uL (ref 150–379)
RBC: 4.14 x10E6/uL (ref 3.77–5.28)
RDW: 14.2 % (ref 12.3–15.4)
WBC: 4.9 10*3/uL (ref 3.4–10.8)

## 2016-01-17 LAB — TSH: TSH: 6.25 u[IU]/mL — ABNORMAL HIGH (ref 0.450–4.500)

## 2016-01-18 DIAGNOSIS — R41841 Cognitive communication deficit: Secondary | ICD-10-CM | POA: Diagnosis not present

## 2016-01-18 DIAGNOSIS — R488 Other symbolic dysfunctions: Secondary | ICD-10-CM | POA: Diagnosis not present

## 2016-01-18 DIAGNOSIS — R278 Other lack of coordination: Secondary | ICD-10-CM | POA: Diagnosis not present

## 2016-01-18 DIAGNOSIS — M6281 Muscle weakness (generalized): Secondary | ICD-10-CM | POA: Diagnosis not present

## 2016-01-18 DIAGNOSIS — R2681 Unsteadiness on feet: Secondary | ICD-10-CM | POA: Diagnosis not present

## 2016-01-18 LAB — URINE CULTURE

## 2016-01-19 ENCOUNTER — Telehealth: Payer: Self-pay

## 2016-01-19 DIAGNOSIS — R2681 Unsteadiness on feet: Secondary | ICD-10-CM | POA: Diagnosis not present

## 2016-01-19 DIAGNOSIS — R278 Other lack of coordination: Secondary | ICD-10-CM | POA: Diagnosis not present

## 2016-01-19 DIAGNOSIS — R488 Other symbolic dysfunctions: Secondary | ICD-10-CM | POA: Diagnosis not present

## 2016-01-19 DIAGNOSIS — R41841 Cognitive communication deficit: Secondary | ICD-10-CM | POA: Diagnosis not present

## 2016-01-19 DIAGNOSIS — M6281 Muscle weakness (generalized): Secondary | ICD-10-CM | POA: Diagnosis not present

## 2016-01-19 MED ORDER — CIPROFLOXACIN HCL 500 MG PO TABS: 500.0000 mg | ORAL_TABLET | Freq: Two times a day (BID) | ORAL | Status: DC

## 2016-01-19 NOTE — Telephone Encounter (Signed)
-----   Message from Lauree Chandler, NP sent at 01/18/2016  6:05 PM EST ----- Lab work stable, electrolytes, kidney function, blood counts good. TSH which measure thyroid function is slightly elevated, will monitor this for now. Pt does have UTI To start Cipro 500 mg twice daily by mouth  for 1 week. Please send to pharmacy of choice.

## 2016-01-19 NOTE — Telephone Encounter (Signed)
Patient daughter notified of lab results (see result note).

## 2016-01-22 DIAGNOSIS — R41841 Cognitive communication deficit: Secondary | ICD-10-CM | POA: Diagnosis not present

## 2016-01-22 DIAGNOSIS — M6281 Muscle weakness (generalized): Secondary | ICD-10-CM | POA: Diagnosis not present

## 2016-01-22 DIAGNOSIS — R488 Other symbolic dysfunctions: Secondary | ICD-10-CM | POA: Diagnosis not present

## 2016-01-22 DIAGNOSIS — R2681 Unsteadiness on feet: Secondary | ICD-10-CM | POA: Diagnosis not present

## 2016-01-22 DIAGNOSIS — R278 Other lack of coordination: Secondary | ICD-10-CM | POA: Diagnosis not present

## 2016-01-23 DIAGNOSIS — M6281 Muscle weakness (generalized): Secondary | ICD-10-CM | POA: Diagnosis not present

## 2016-01-23 DIAGNOSIS — R488 Other symbolic dysfunctions: Secondary | ICD-10-CM | POA: Diagnosis not present

## 2016-01-23 DIAGNOSIS — R2681 Unsteadiness on feet: Secondary | ICD-10-CM | POA: Diagnosis not present

## 2016-01-23 DIAGNOSIS — R278 Other lack of coordination: Secondary | ICD-10-CM | POA: Diagnosis not present

## 2016-01-23 DIAGNOSIS — R41841 Cognitive communication deficit: Secondary | ICD-10-CM | POA: Diagnosis not present

## 2016-01-24 DIAGNOSIS — R41841 Cognitive communication deficit: Secondary | ICD-10-CM | POA: Diagnosis not present

## 2016-01-24 DIAGNOSIS — R278 Other lack of coordination: Secondary | ICD-10-CM | POA: Diagnosis not present

## 2016-01-24 DIAGNOSIS — Z961 Presence of intraocular lens: Secondary | ICD-10-CM | POA: Diagnosis not present

## 2016-01-24 DIAGNOSIS — R2681 Unsteadiness on feet: Secondary | ICD-10-CM | POA: Diagnosis not present

## 2016-01-24 DIAGNOSIS — H353232 Exudative age-related macular degeneration, bilateral, with inactive choroidal neovascularization: Secondary | ICD-10-CM | POA: Diagnosis not present

## 2016-01-24 DIAGNOSIS — M6281 Muscle weakness (generalized): Secondary | ICD-10-CM | POA: Diagnosis not present

## 2016-01-24 DIAGNOSIS — R488 Other symbolic dysfunctions: Secondary | ICD-10-CM | POA: Diagnosis not present

## 2016-01-25 DIAGNOSIS — R41841 Cognitive communication deficit: Secondary | ICD-10-CM | POA: Diagnosis not present

## 2016-01-25 DIAGNOSIS — R278 Other lack of coordination: Secondary | ICD-10-CM | POA: Diagnosis not present

## 2016-01-25 DIAGNOSIS — R2681 Unsteadiness on feet: Secondary | ICD-10-CM | POA: Diagnosis not present

## 2016-01-25 DIAGNOSIS — M6281 Muscle weakness (generalized): Secondary | ICD-10-CM | POA: Diagnosis not present

## 2016-01-25 DIAGNOSIS — R488 Other symbolic dysfunctions: Secondary | ICD-10-CM | POA: Diagnosis not present

## 2016-01-26 DIAGNOSIS — H353221 Exudative age-related macular degeneration, left eye, with active choroidal neovascularization: Secondary | ICD-10-CM | POA: Diagnosis not present

## 2016-01-29 DIAGNOSIS — R2681 Unsteadiness on feet: Secondary | ICD-10-CM | POA: Diagnosis not present

## 2016-01-29 DIAGNOSIS — M6281 Muscle weakness (generalized): Secondary | ICD-10-CM | POA: Diagnosis not present

## 2016-01-29 DIAGNOSIS — R278 Other lack of coordination: Secondary | ICD-10-CM | POA: Diagnosis not present

## 2016-01-29 DIAGNOSIS — R488 Other symbolic dysfunctions: Secondary | ICD-10-CM | POA: Diagnosis not present

## 2016-01-29 DIAGNOSIS — R41841 Cognitive communication deficit: Secondary | ICD-10-CM | POA: Diagnosis not present

## 2016-01-30 DIAGNOSIS — R278 Other lack of coordination: Secondary | ICD-10-CM | POA: Diagnosis not present

## 2016-01-30 DIAGNOSIS — M6281 Muscle weakness (generalized): Secondary | ICD-10-CM | POA: Diagnosis not present

## 2016-01-30 DIAGNOSIS — R41841 Cognitive communication deficit: Secondary | ICD-10-CM | POA: Diagnosis not present

## 2016-01-30 DIAGNOSIS — R488 Other symbolic dysfunctions: Secondary | ICD-10-CM | POA: Diagnosis not present

## 2016-01-30 DIAGNOSIS — R2681 Unsteadiness on feet: Secondary | ICD-10-CM | POA: Diagnosis not present

## 2016-01-31 DIAGNOSIS — M6281 Muscle weakness (generalized): Secondary | ICD-10-CM | POA: Diagnosis not present

## 2016-01-31 DIAGNOSIS — R2681 Unsteadiness on feet: Secondary | ICD-10-CM | POA: Diagnosis not present

## 2016-01-31 DIAGNOSIS — R41841 Cognitive communication deficit: Secondary | ICD-10-CM | POA: Diagnosis not present

## 2016-01-31 DIAGNOSIS — R278 Other lack of coordination: Secondary | ICD-10-CM | POA: Diagnosis not present

## 2016-01-31 DIAGNOSIS — R488 Other symbolic dysfunctions: Secondary | ICD-10-CM | POA: Diagnosis not present

## 2016-02-05 DIAGNOSIS — R41841 Cognitive communication deficit: Secondary | ICD-10-CM | POA: Diagnosis not present

## 2016-02-05 DIAGNOSIS — R488 Other symbolic dysfunctions: Secondary | ICD-10-CM | POA: Diagnosis not present

## 2016-02-05 DIAGNOSIS — R2681 Unsteadiness on feet: Secondary | ICD-10-CM | POA: Diagnosis not present

## 2016-02-05 DIAGNOSIS — R278 Other lack of coordination: Secondary | ICD-10-CM | POA: Diagnosis not present

## 2016-02-05 DIAGNOSIS — M6281 Muscle weakness (generalized): Secondary | ICD-10-CM | POA: Diagnosis not present

## 2016-02-06 DIAGNOSIS — R278 Other lack of coordination: Secondary | ICD-10-CM | POA: Diagnosis not present

## 2016-02-06 DIAGNOSIS — M6281 Muscle weakness (generalized): Secondary | ICD-10-CM | POA: Diagnosis not present

## 2016-02-06 DIAGNOSIS — R41841 Cognitive communication deficit: Secondary | ICD-10-CM | POA: Diagnosis not present

## 2016-02-06 DIAGNOSIS — R488 Other symbolic dysfunctions: Secondary | ICD-10-CM | POA: Diagnosis not present

## 2016-02-06 DIAGNOSIS — R2681 Unsteadiness on feet: Secondary | ICD-10-CM | POA: Diagnosis not present

## 2016-02-07 DIAGNOSIS — R41841 Cognitive communication deficit: Secondary | ICD-10-CM | POA: Diagnosis not present

## 2016-02-07 DIAGNOSIS — R2681 Unsteadiness on feet: Secondary | ICD-10-CM | POA: Diagnosis not present

## 2016-02-07 DIAGNOSIS — R278 Other lack of coordination: Secondary | ICD-10-CM | POA: Diagnosis not present

## 2016-02-07 DIAGNOSIS — M6281 Muscle weakness (generalized): Secondary | ICD-10-CM | POA: Diagnosis not present

## 2016-02-07 DIAGNOSIS — R488 Other symbolic dysfunctions: Secondary | ICD-10-CM | POA: Diagnosis not present

## 2016-02-08 ENCOUNTER — Ambulatory Visit (INDEPENDENT_AMBULATORY_CARE_PROVIDER_SITE_OTHER): Payer: Medicare Other | Admitting: Nurse Practitioner

## 2016-02-08 ENCOUNTER — Encounter: Payer: Self-pay | Admitting: Nurse Practitioner

## 2016-02-08 VITALS — BP 140/80 | HR 63 | Temp 98.0°F | Resp 20 | Ht 62.0 in | Wt 136.0 lb

## 2016-02-08 DIAGNOSIS — M6281 Muscle weakness (generalized): Secondary | ICD-10-CM | POA: Diagnosis not present

## 2016-02-08 DIAGNOSIS — K5901 Slow transit constipation: Secondary | ICD-10-CM | POA: Diagnosis not present

## 2016-02-08 DIAGNOSIS — F0391 Unspecified dementia with behavioral disturbance: Secondary | ICD-10-CM

## 2016-02-08 DIAGNOSIS — I248 Other forms of acute ischemic heart disease: Secondary | ICD-10-CM

## 2016-02-08 DIAGNOSIS — Z0000 Encounter for general adult medical examination without abnormal findings: Secondary | ICD-10-CM | POA: Diagnosis not present

## 2016-02-08 DIAGNOSIS — G4700 Insomnia, unspecified: Secondary | ICD-10-CM

## 2016-02-08 DIAGNOSIS — R41841 Cognitive communication deficit: Secondary | ICD-10-CM | POA: Diagnosis not present

## 2016-02-08 DIAGNOSIS — F418 Other specified anxiety disorders: Secondary | ICD-10-CM | POA: Diagnosis not present

## 2016-02-08 DIAGNOSIS — R278 Other lack of coordination: Secondary | ICD-10-CM | POA: Diagnosis not present

## 2016-02-08 DIAGNOSIS — F03918 Unspecified dementia, unspecified severity, with other behavioral disturbance: Secondary | ICD-10-CM

## 2016-02-08 DIAGNOSIS — R2681 Unsteadiness on feet: Secondary | ICD-10-CM | POA: Diagnosis not present

## 2016-02-08 DIAGNOSIS — F419 Anxiety disorder, unspecified: Secondary | ICD-10-CM

## 2016-02-08 DIAGNOSIS — R488 Other symbolic dysfunctions: Secondary | ICD-10-CM | POA: Diagnosis not present

## 2016-02-08 DIAGNOSIS — F329 Major depressive disorder, single episode, unspecified: Secondary | ICD-10-CM

## 2016-02-08 MED ORDER — DONEPEZIL HCL 10 MG PO TABS: ORAL_TABLET | ORAL | Status: DC

## 2016-02-08 NOTE — Patient Instructions (Addendum)
To start Aricept 5 mg (1/2 tablet) by mouth at night for 4 weeks then increase to 10 mg (1 tablet) nightly   May use miralax 17 gm daily or every other day as needed for constipation To increase colace to 100 mg twice daily May also try Senakot S by mouth daily for constipation Increase water intake and activity  30 mins/5 days a week of cardiovascular activity.    To make appt on next Tuesday for callus removal  To make appt in 6 week with Dr Mariea Clonts for follow up on Aricept and evaluation of antidepressant

## 2016-02-08 NOTE — Progress Notes (Signed)
Patient ID: Brittney Gutierrez, female   DOB: 13-Mar-1927, 82 y.o.   MRN: [id number]    PCP: Lauree Chandler, NP  Advanced Directive information Does patient have an advance directive?: Yes, Type of Advance Directive: [place], Does patient want to make changes to advanced directive?: No - Patient declined  Allergies  Allergen Reactions  . Amoxicillin Other (See Comments)    Unknown- ? Upset stomach  . Demerol [Meperidine]     See things    Chief Complaint  Patient presents with  . Annual Exam     HPI: Patient is a 82 y.o. female seen in the office today for annual exam. Pt recently established care with practice after moving from [place]. Pt with hx of dementia with behaviors, a fib, htn, macular degeneration, TIA, anxiety, insomnia  Pt following with cardiology for a fib.  Screenings: Colon Cancer- aged out Breast Cancer- aged out Cervical Cancer- aged out Osteoporosis- Dexa Scan - has had hx of osteoporosis, previously taking medication but has been taken off this medication. Depression screening Depression screen [facility] 2/9 02/08/2016 01/16/2016  Decreased Interest 0 0  Down, Depressed, Hopeless 1 0  PHQ - 2 Score 1 0   Falls Fall Risk  02/08/2016 01/16/2016  Falls in the past year? No No   MMSE MMSE - Mini Mental State Exam 02/08/2016  Not completed: (No Data)  Orientation to time 3  Orientation to Place 2  Registration 3  Attention/ Calculation 4  Recall 1  Language- name 2 objects 2  Language- repeat 0  Language- follow 3 step command 3  Language- read & follow direction 1  Write a sentence 1  Copy design 0  Total score 20    Working with ST for memory cues at independent living   Vaccines Immunization History  Administered Date(s) Administered  . Influenza-Unspecified 12/09/2014  . Pneumococcal Conjugate-13 10/24/2015  . Pneumococcal-Unspecified 10/19/2007  . Tdap 05/14/2012    Smoking status:.none Alcohol use: none  Dentist: in  the past she has gone routinely, looking into finding a dentist in town Ophthalmologist: routinely due to macular degeneration of right eye and routine eye exams.   Exercise regimen: working with physical therapy Diet- eating 2 meals a day, eating in dinning room at independent living some of the times.   Functional Status of ADLs: independent of all ADLs  Incontinence- of urine.   Following with neurologist in [place] for memory loss and hallucinations. Taking risperdal .5 mg q hs. Heard yelling across the hall. Daughter thinks this was an hallucination but pt reports it really happened.   Taking melatonin at night- most the time she sleeps good. Not taking xanax anymore at night.   Ongoing constipation- got colace but only using as needed.   Review of Systems:  Review of Systems  Constitutional: Negative for activity change, appetite change, fatigue and unexpected weight change.  HENT: Negative for congestion and hearing loss.   Eyes: Negative.   Respiratory: Negative for cough and shortness of breath.   Cardiovascular: Negative for chest pain, palpitations and leg swelling.  Gastrointestinal: Positive for constipation. Negative for abdominal pain and diarrhea.  Genitourinary: Negative for dysuria and difficulty urinating.  Musculoskeletal: Negative for myalgias and arthralgias.  Skin: Negative for color change and wound.  Neurological: Negative for dizziness and weakness.  Psychiatric/Behavioral: Positive for hallucinations and confusion. The patient is nervous/anxious.        Hallucinations have improved  Anxiety better     Past Medical  History  Diagnosis Date  . Paroxysmal a-fib ([place])   . Hypertension   . Formed visual hallucinations     [name]?  Failed Keppra and Depakote  . Macular degeneration   . Diverticulitis 09/28/2007  . TIA (transient ischemic attack)   . Neurocysticercosis     Craniotomy at [facility] in early 2000s  . Depression 09/06/2002  . UTI  (urinary tract infection)   . Paranoia ([place])   . Bradycardia, drug induced   . Leg pain, left   . Pure hypercholesterolemia 12/20/1999  . Anxiety 01/17/2003  . Cerebral atherosclerosis 09/24/1999  . Back pain 08/19/2005    with radiculopathy  . Chest pain, atypical   . Diverticulosis of colon 06/07/2003  . External hemorrhoids 04/12/2009  . Malaise and fatigue   . Osteoarthrosis, localized   . Osteoporosis 05/28/2004  . PSVT (paroxysmal supraventricular tachycardia) ([place]) 02/22/2002  . Palpitations 11/16/2001  . Female climacteric state 03/04/2000  . Abnormal weight loss 05/28/2004  . Urinary incontinence 08/18/2007  . Vaginitis, atrophic   . Vertigo, peripheral 10/07/2000  . Hallucination, visual   . Depression with anxiety    Past Surgical History  Procedure Laterality Date  . Shoulder surgery Right   . Knee surgery Left 1990  . Brain surgery  2000    Neurocysticercosis  . Vaginal hysterectomy  1960  . Rotator cuff repair  2000  . Cataract extraction  2013   Social History:   reports that she has never smoked. She has never used smokeless tobacco. She reports that she does not drink alcohol or use illicit drugs.  Family History  Problem Relation Age of Onset  . Dementia Neg Hx   . Polycystic kidney disease Mother   . Heart attack Father   . Hypertension Sister     Medications: Patient's Medications  New Prescriptions   No medications on file  Previous Medications   ACETAMINOPHEN (TYLENOL) 325 MG TABLET    Take 650 mg by mouth every 6 (six) hours as needed for mild pain.   ALPRAZOLAM (XANAX) 0.5 MG TABLET    Take 0.5 mg by mouth 2 (two) times daily as needed for anxiety.   ASPIRIN 325 MG TABLET    Take 325 mg by mouth daily.   DILTIAZEM (DILTIAZEM CD) 240 MG 24 HR CAPSULE    Take 240 mg by mouth daily.   METOPROLOL SUCCINATE (TOPROL-XL) 50 MG 24 HR TABLET    Take 1 tablet daily. Take with or immediately following a meal.   MULTIPLE VITAMINS-MINERALS (PRESERVISION AREDS 2  PO)    Take 1 tablet by mouth 2 (two) times daily.    RISPERIDONE (RISPERDAL) 0.5 MG TABLET    Take 0.5 mg by mouth at bedtime.   VENLAFAXINE (EFFEXOR) 37.5 MG TABLET    Take 37.5 mg by mouth 2 (two) times daily.  Modified Medications   No medications on file  Discontinued Medications   CIPROFLOXACIN (CIPRO) 500 MG TABLET    Take 1 tablet (500 mg total) by mouth 2 (two) times daily.     Physical Exam:  Filed Vitals:   02/08/16 0847  BP: 140/80  Pulse: 63  Temp: 98 F (36.7 C)  TempSrc: Oral  Resp: 20  Height: 5\' 2"  (1.575 m)  Weight: 136 lb (61.689 kg)  SpO2: 97%   Body mass index is 24.87 kg/(m^2).  Physical Exam  Constitutional: She is oriented to person, place, and time. She appears well-developed and well-nourished. No distress.  HENT:  Head:  Normocephalic and atraumatic.  Right Ear: External ear normal.  Left Ear: External ear normal.  Nose: Nose normal.  Mouth/Throat: Oropharynx is clear and moist. No oropharyngeal exudate.  Eyes: Conjunctivae are normal. Pupils are equal, round, and reactive to light.  Neck: Normal range of motion. Neck supple.  Cardiovascular: Normal rate, regular rhythm, normal heart sounds and intact distal pulses.   Pulmonary/Chest: Effort normal and breath sounds normal.  Abdominal: Soft. Bowel sounds are normal. She exhibits no distension. There is no tenderness.  Musculoskeletal: She exhibits no edema or tenderness.  Neurological: She is alert and oriented to person, place, and time. She has normal reflexes.  Skin: Skin is warm and dry. She is not diaphoretic.  Psychiatric: She has a normal mood and affect.    Labs reviewed: Basic Metabolic Panel:  Recent Labs  12/26/15 1536 12/27/15 0410 12/28/15 0314 01/16/16 1058  NA 139 141 141 139  K 3.0* 3.5 3.2* 4.3  CL 98* 105 105 98  CO2 28 24 27 22   GLUCOSE 100* 110* 104* 80  BUN 12 9 12 18   CREATININE 0.89 0.70 0.71 0.73  CALCIUM 9.5 8.5* 8.8* 9.3  MG 1.8  --   --   --   TSH  --    --   --  6.250*   Liver Function Tests:  Recent Labs  12/26/15 1536 01/16/16 1058  AST 26 14  ALT 17 15  ALKPHOS 101 114  BILITOT 0.6 <0.2  PROT 8.2* 7.1  ALBUMIN 3.8 3.9   No results for input(s): LIPASE, AMYLASE in the last 8760 hours. No results for input(s): AMMONIA in the last 8760 hours. CBC:  Recent Labs  12/26/15 1536 12/27/15 0410 12/28/15 0314 01/16/16 1058  WBC 6.0 6.7 5.0 4.9  NEUTROABS  --   --   --  3.1  HGB 12.9 11.9* 10.7*  --   HCT 39.0 36.1 33.4* 35.9  MCV 88.2 88.9 88.1 87  PLT 243 297 243 266   Lipid Panel: No results for input(s): CHOL, HDL, LDLCALC, TRIG, CHOLHDL, LDLDIRECT in the last 8760 hours. TSH:  Recent Labs  01/16/16 1058  TSH 6.250*   A1C: No results found for: HGBA1C   Assessment/Plan 1. Medicare annual wellness visit, subsequent The patient is doing well. No recent falls. Has recently moved from [place] to be closer to daughters. living at independent living with assistance. Daughters doing iADL and medications.  -MMSE of 20/30. Has seen neurologist who started on Risperdal.  -no recent falls. Hx of osteoporosis but unsure when last DEXA scan was, daughter reports she has been on medication in the past, unknown as to when or what it was.  -will follow up Dexa at this time.    2. Dementia with behavioral disturbance -MMSE of 20/30 -pt has moved closer to family due to needing increased assistance.  -will start aricept at this time.  - donepezil (ARICEPT) 10 MG tablet; 1/2 tablet by mouth in the evening for memory loss- after 4 weeks to increase to 1 tablet by mouth daily  Dispense: 30 tablet; Refill: 2 -pt also taking Risperdal due to increase delusions that were increasing stress and anxiety. Medication has been beneficial to pt, no side effects noted with medication.  3. Slow transit constipation -to increase fluid intake and activity -May use miralax 17 gm daily or every other day as needed for constipation To  increase colace to 100 mg twice daily May also try Senakot S by mouth daily for constipation  4. Anxiety and depression -conts on effexor. Daughter questions if this is really beneficial but due to new adjustments with living situation and starting Aricept will cont for now and re-evaluate at next visit. -cont xanax as needed  5. Insomnia Has weaned off xanax qhs and started melatonin with good results.   Follow up in 6 weeks with Dr Sharee Holster K. Harle Battiest  [facility] [phone] 8 am - 5 pm) [phone] (after hours)

## 2016-02-09 ENCOUNTER — Ambulatory Visit: Payer: Self-pay | Admitting: Internal Medicine

## 2016-02-12 DIAGNOSIS — R2681 Unsteadiness on feet: Secondary | ICD-10-CM | POA: Diagnosis not present

## 2016-02-12 DIAGNOSIS — R41841 Cognitive communication deficit: Secondary | ICD-10-CM | POA: Diagnosis not present

## 2016-02-12 DIAGNOSIS — R278 Other lack of coordination: Secondary | ICD-10-CM | POA: Diagnosis not present

## 2016-02-12 DIAGNOSIS — R488 Other symbolic dysfunctions: Secondary | ICD-10-CM | POA: Diagnosis not present

## 2016-02-12 DIAGNOSIS — M6281 Muscle weakness (generalized): Secondary | ICD-10-CM | POA: Diagnosis not present

## 2016-02-12 MED ORDER — MELATONIN 3 MG PO CAPS: 3.0000 mg | ORAL_CAPSULE | Freq: Every evening | ORAL | Status: DC | PRN

## 2016-02-13 ENCOUNTER — Encounter: Payer: Self-pay | Admitting: Nurse Practitioner

## 2016-02-13 ENCOUNTER — Ambulatory Visit (INDEPENDENT_AMBULATORY_CARE_PROVIDER_SITE_OTHER): Payer: Medicare Other | Admitting: Nurse Practitioner

## 2016-02-13 VITALS — BP 142/80 | HR 64 | Temp 98.0°F | Resp 14 | Ht 62.0 in | Wt 132.0 lb

## 2016-02-13 DIAGNOSIS — M6281 Muscle weakness (generalized): Secondary | ICD-10-CM | POA: Diagnosis not present

## 2016-02-13 DIAGNOSIS — R41841 Cognitive communication deficit: Secondary | ICD-10-CM | POA: Diagnosis not present

## 2016-02-13 DIAGNOSIS — F03918 Unspecified dementia, unspecified severity, with other behavioral disturbance: Secondary | ICD-10-CM

## 2016-02-13 DIAGNOSIS — F0391 Unspecified dementia with behavioral disturbance: Secondary | ICD-10-CM | POA: Diagnosis not present

## 2016-02-13 DIAGNOSIS — L84 Corns and callosities: Secondary | ICD-10-CM

## 2016-02-13 DIAGNOSIS — R278 Other lack of coordination: Secondary | ICD-10-CM | POA: Diagnosis not present

## 2016-02-13 DIAGNOSIS — I248 Other forms of acute ischemic heart disease: Secondary | ICD-10-CM | POA: Diagnosis not present

## 2016-02-13 DIAGNOSIS — R488 Other symbolic dysfunctions: Secondary | ICD-10-CM | POA: Diagnosis not present

## 2016-02-13 DIAGNOSIS — R2681 Unsteadiness on feet: Secondary | ICD-10-CM | POA: Diagnosis not present

## 2016-02-13 NOTE — Progress Notes (Signed)
Patient ID: Brittney Gutierrez, female   DOB: 1927-02-19, 82 y.o.   MRN: [id number]    PCP: Lauree Chandler, NP  Advanced Directive information    Allergies  Allergen Reactions  . Amoxicillin Other (See Comments)    Unknown- ? Upset stomach  . Demerol [Meperidine]     See things    Chief Complaint  Patient presents with  . Acute Visit    Callus concerns on left foot, here with daughter [name].     HPI: Patient is a 82 y.o. female seen in the office today for callus removal. Pt has had callus on bottom of left foot that has caused her pain for years when walking  Started aricept- tolerating well. Has had vivid dreams but nothing that has bothered her   Review of Systems:  Review of Systems  Skin:       Painful callus to left foot  Psychiatric/Behavioral: Positive for confusion. Negative for sleep disturbance.  All other systems reviewed and are negative.   Past Medical History  Diagnosis Date  . Paroxysmal a-fib ([place])   . Hypertension   . Formed visual hallucinations     [name]?  Failed Keppra and Depakote  . Macular degeneration   . Diverticulitis 09/28/2007  . TIA (transient ischemic attack)   . Neurocysticercosis     Craniotomy at [facility] in early 2000s  . Depression 09/06/2002  . UTI (urinary tract infection)   . Paranoia ([place])   . Bradycardia, drug induced   . Leg pain, left   . Pure hypercholesterolemia 12/20/1999  . Anxiety 01/17/2003  . Cerebral atherosclerosis 09/24/1999  . Back pain 08/19/2005    with radiculopathy  . Chest pain, atypical   . Diverticulosis of colon 06/07/2003  . External hemorrhoids 04/12/2009  . Malaise and fatigue   . Osteoarthrosis, localized   . Osteoporosis 05/28/2004  . PSVT (paroxysmal supraventricular tachycardia) ([place]) 02/22/2002  . Palpitations 11/16/2001  . Female climacteric state 03/04/2000  . Abnormal weight loss 05/28/2004  . Urinary incontinence 08/18/2007  . Vaginitis, atrophic   . Vertigo, peripheral 10/07/2000    . Hallucination, visual   . Depression with anxiety    Past Surgical History  Procedure Laterality Date  . Shoulder surgery Right   . Knee surgery Left 1990  . Brain surgery  2000    Neurocysticercosis  . Vaginal hysterectomy  1960  . Rotator cuff repair  2000  . Cataract extraction  2013   Social History:   reports that she has never smoked. She has never used smokeless tobacco. She reports that she does not drink alcohol or use illicit drugs.  Family History  Problem Relation Age of Onset  . Dementia Neg Hx   . Polycystic kidney disease Mother   . Heart attack Father   . Hypertension Sister     Medications: Patient's Medications  New Prescriptions   No medications on file  Previous Medications   ACETAMINOPHEN (TYLENOL) 325 MG TABLET    Take 650 mg by mouth every 6 (six) hours as needed for mild pain.   ALPRAZOLAM (XANAX) 0.5 MG TABLET    Take 0.5 mg by mouth 2 (two) times daily as needed for anxiety.   ASPIRIN 325 MG TABLET    Take 325 mg by mouth daily.   DILTIAZEM (DILTIAZEM CD) 240 MG 24 HR CAPSULE    Take 240 mg by mouth daily.   DONEPEZIL (ARICEPT) 10 MG TABLET    1/2 tablet by mouth in the  evening for memory loss- after 4 weeks to increase to 1 tablet by mouth daily   MELATONIN 3 MG CAPS    Take 1 capsule (3 mg total) by mouth at bedtime as needed.   METOPROLOL SUCCINATE (TOPROL-XL) 50 MG 24 HR TABLET    Take 1 tablet daily. Take with or immediately following a meal.   MULTIPLE VITAMINS-MINERALS (PRESERVISION AREDS 2 PO)    Take 1 tablet by mouth 2 (two) times daily.    RISPERIDONE (RISPERDAL) 0.5 MG TABLET    Take 0.5 mg by mouth at bedtime.   VENLAFAXINE (EFFEXOR) 37.5 MG TABLET    Take 37.5 mg by mouth 2 (two) times daily.  Modified Medications   No medications on file  Discontinued Medications   No medications on file     Physical Exam:  Filed Vitals:   02/13/16 0837  BP: 142/80  Pulse: 64  Temp: 98 F (36.7 C)  TempSrc: Oral  Resp: 14  Height: 5'  2" (1.575 m)  Weight: 132 lb (59.875 kg)  SpO2: 98%   Body mass index is 24.14 kg/(m^2).  Physical Exam  Constitutional: She appears well-developed and well-nourished. No distress.  Musculoskeletal: She exhibits tenderness (to callus).  Neurological: She is alert.  Skin: Skin is warm and dry. She is not diaphoretic. No erythema.  Large callus noted to left plantar surface  Psychiatric: She has a normal mood and affect.    Labs reviewed: Basic Metabolic Panel:  Recent Labs  12/26/15 1536 12/27/15 0410 12/28/15 0314 01/16/16 1058  NA 139 141 141 139  K 3.0* 3.5 3.2* 4.3  CL 98* 105 105 98  CO2 28 24 27 22   GLUCOSE 100* 110* 104* 80  BUN 12 9 12 18   CREATININE 0.89 0.70 0.71 0.73  CALCIUM 9.5 8.5* 8.8* 9.3  MG 1.8  --   --   --   TSH  --   --   --  6.250*   Liver Function Tests:  Recent Labs  12/26/15 1536 01/16/16 1058  AST 26 14  ALT 17 15  ALKPHOS 101 114  BILITOT 0.6 <0.2  PROT 8.2* 7.1  ALBUMIN 3.8 3.9   No results for input(s): LIPASE, AMYLASE in the last 8760 hours. No results for input(s): AMMONIA in the last 8760 hours. CBC:  Recent Labs  12/26/15 1536 12/27/15 0410 12/28/15 0314 01/16/16 1058  WBC 6.0 6.7 5.0 4.9  NEUTROABS  --   --   --  3.1  HGB 12.9 11.9* 10.7*  --   HCT 39.0 36.1 33.4* 35.9  MCV 88.2 88.9 88.1 87  PLT 243 297 243 266   Lipid Panel: No results for input(s): CHOL, HDL, LDLCALC, TRIG, CHOLHDL, LDLDIRECT in the last 8760 hours. TSH:  Recent Labs  01/16/16 1058  TSH 6.250*   A1C: No results found for: HGBA1C   Assessment/Plan 1. Callus of foot -callus removed using alcohol to clean area- #10 scalpel blade- pt tolerated procedure and reports relief when walking.   2. Dementia with behavioral disturbance -tolerating Aricept without side effects.  -cont aricept as prescribed  Total time 25 mins for callus removal.  Keep follow up with Dr Sharee Holster K. Harle Battiest  [facility] [phone] 8 am - 5 pm) [phone] (after hours)

## 2016-02-14 DIAGNOSIS — M6281 Muscle weakness (generalized): Secondary | ICD-10-CM | POA: Diagnosis not present

## 2016-02-14 DIAGNOSIS — R41841 Cognitive communication deficit: Secondary | ICD-10-CM | POA: Diagnosis not present

## 2016-02-14 DIAGNOSIS — R278 Other lack of coordination: Secondary | ICD-10-CM | POA: Diagnosis not present

## 2016-02-14 DIAGNOSIS — R488 Other symbolic dysfunctions: Secondary | ICD-10-CM | POA: Diagnosis not present

## 2016-02-14 DIAGNOSIS — R2681 Unsteadiness on feet: Secondary | ICD-10-CM | POA: Diagnosis not present

## 2016-02-15 DIAGNOSIS — R278 Other lack of coordination: Secondary | ICD-10-CM | POA: Diagnosis not present

## 2016-02-15 DIAGNOSIS — R488 Other symbolic dysfunctions: Secondary | ICD-10-CM | POA: Diagnosis not present

## 2016-02-15 DIAGNOSIS — R2681 Unsteadiness on feet: Secondary | ICD-10-CM | POA: Diagnosis not present

## 2016-02-15 DIAGNOSIS — M6281 Muscle weakness (generalized): Secondary | ICD-10-CM | POA: Diagnosis not present

## 2016-02-15 DIAGNOSIS — R41841 Cognitive communication deficit: Secondary | ICD-10-CM | POA: Diagnosis not present

## 2016-02-19 ENCOUNTER — Telehealth: Payer: Self-pay | Admitting: *Deleted

## 2016-02-19 DIAGNOSIS — M25512 Pain in left shoulder: Secondary | ICD-10-CM | POA: Diagnosis not present

## 2016-02-19 DIAGNOSIS — R278 Other lack of coordination: Secondary | ICD-10-CM | POA: Diagnosis not present

## 2016-02-19 DIAGNOSIS — R41841 Cognitive communication deficit: Secondary | ICD-10-CM | POA: Diagnosis not present

## 2016-02-19 DIAGNOSIS — M6281 Muscle weakness (generalized): Secondary | ICD-10-CM | POA: Diagnosis not present

## 2016-02-19 DIAGNOSIS — R2681 Unsteadiness on feet: Secondary | ICD-10-CM | POA: Diagnosis not present

## 2016-02-19 NOTE — Telephone Encounter (Signed)
I would try to give it 2 more weeks.

## 2016-02-19 NOTE — Telephone Encounter (Signed)
Brittney Gutierrez, daughter called and stated that Brittney Gutierrez had put patient on Aricept. Patient is having really bad dreams. Patient has only been on the medication since 02/08/16. Should she discontinue and start something else or should she give it alittle more time. Please Advise.

## 2016-02-20 DIAGNOSIS — M25512 Pain in left shoulder: Secondary | ICD-10-CM | POA: Diagnosis not present

## 2016-02-20 DIAGNOSIS — R278 Other lack of coordination: Secondary | ICD-10-CM | POA: Diagnosis not present

## 2016-02-20 DIAGNOSIS — R2681 Unsteadiness on feet: Secondary | ICD-10-CM | POA: Diagnosis not present

## 2016-02-20 DIAGNOSIS — M6281 Muscle weakness (generalized): Secondary | ICD-10-CM | POA: Diagnosis not present

## 2016-02-20 DIAGNOSIS — R41841 Cognitive communication deficit: Secondary | ICD-10-CM | POA: Diagnosis not present

## 2016-02-20 NOTE — Telephone Encounter (Signed)
Discussed with [name] (patient's daughter), patient is refusing to take medication but she will try to enforce her taking medication x 2 more weeks to see if bad dreams subside

## 2016-02-20 NOTE — Telephone Encounter (Signed)
Error

## 2016-02-21 DIAGNOSIS — R2681 Unsteadiness on feet: Secondary | ICD-10-CM | POA: Diagnosis not present

## 2016-02-21 DIAGNOSIS — R41841 Cognitive communication deficit: Secondary | ICD-10-CM | POA: Diagnosis not present

## 2016-02-21 DIAGNOSIS — R278 Other lack of coordination: Secondary | ICD-10-CM | POA: Diagnosis not present

## 2016-02-21 DIAGNOSIS — M6281 Muscle weakness (generalized): Secondary | ICD-10-CM | POA: Diagnosis not present

## 2016-02-21 DIAGNOSIS — M25512 Pain in left shoulder: Secondary | ICD-10-CM | POA: Diagnosis not present

## 2016-02-22 NOTE — Telephone Encounter (Signed)
Patient daughter, Brittney Gutierrez called and stated that patient woke up with heart palpitations and sweats from taking Aricept and patient will not take anymore. Daughter would like advice on what to do. Please Advise.

## 2016-02-22 NOTE — Telephone Encounter (Signed)
Patient daughter notified and agreed.  

## 2016-02-22 NOTE — Telephone Encounter (Signed)
Leave off the Aricept. I would not start anything at this time to replace it. This can be discussed at her appointment on 04/05/2016.

## 2016-02-23 DIAGNOSIS — M25512 Pain in left shoulder: Secondary | ICD-10-CM | POA: Diagnosis not present

## 2016-02-23 DIAGNOSIS — M6281 Muscle weakness (generalized): Secondary | ICD-10-CM | POA: Diagnosis not present

## 2016-02-23 DIAGNOSIS — R41841 Cognitive communication deficit: Secondary | ICD-10-CM | POA: Diagnosis not present

## 2016-02-23 DIAGNOSIS — R278 Other lack of coordination: Secondary | ICD-10-CM | POA: Diagnosis not present

## 2016-02-23 DIAGNOSIS — R2681 Unsteadiness on feet: Secondary | ICD-10-CM | POA: Diagnosis not present

## 2016-02-26 DIAGNOSIS — M6281 Muscle weakness (generalized): Secondary | ICD-10-CM | POA: Diagnosis not present

## 2016-02-26 DIAGNOSIS — R278 Other lack of coordination: Secondary | ICD-10-CM | POA: Diagnosis not present

## 2016-02-26 DIAGNOSIS — M25512 Pain in left shoulder: Secondary | ICD-10-CM | POA: Diagnosis not present

## 2016-02-26 DIAGNOSIS — R41841 Cognitive communication deficit: Secondary | ICD-10-CM | POA: Diagnosis not present

## 2016-02-26 DIAGNOSIS — R2681 Unsteadiness on feet: Secondary | ICD-10-CM | POA: Diagnosis not present

## 2016-02-27 DIAGNOSIS — M25512 Pain in left shoulder: Secondary | ICD-10-CM | POA: Diagnosis not present

## 2016-02-27 DIAGNOSIS — M6281 Muscle weakness (generalized): Secondary | ICD-10-CM | POA: Diagnosis not present

## 2016-02-27 DIAGNOSIS — R41841 Cognitive communication deficit: Secondary | ICD-10-CM | POA: Diagnosis not present

## 2016-02-27 DIAGNOSIS — R2681 Unsteadiness on feet: Secondary | ICD-10-CM | POA: Diagnosis not present

## 2016-02-27 DIAGNOSIS — R278 Other lack of coordination: Secondary | ICD-10-CM | POA: Diagnosis not present

## 2016-02-28 DIAGNOSIS — M25512 Pain in left shoulder: Secondary | ICD-10-CM | POA: Diagnosis not present

## 2016-02-28 DIAGNOSIS — R41841 Cognitive communication deficit: Secondary | ICD-10-CM | POA: Diagnosis not present

## 2016-02-28 DIAGNOSIS — R2681 Unsteadiness on feet: Secondary | ICD-10-CM | POA: Diagnosis not present

## 2016-02-28 DIAGNOSIS — R278 Other lack of coordination: Secondary | ICD-10-CM | POA: Diagnosis not present

## 2016-02-28 DIAGNOSIS — M6281 Muscle weakness (generalized): Secondary | ICD-10-CM | POA: Diagnosis not present

## 2016-02-29 ENCOUNTER — Inpatient Hospital Stay (HOSPITAL_COMMUNITY)
Admission: EM | Admit: 2016-02-29 | Discharge: 2016-03-04 | DRG: 309 | Disposition: A | Discharge status: Home / Self Care | Payer: Medicare Other | Attending: Cardiovascular Disease | Admitting: Cardiovascular Disease

## 2016-02-29 ENCOUNTER — Emergency Department (HOSPITAL_COMMUNITY): Payer: Medicare Other

## 2016-02-29 ENCOUNTER — Telehealth: Payer: Self-pay | Admitting: Cardiovascular Disease

## 2016-02-29 ENCOUNTER — Encounter (HOSPITAL_COMMUNITY): Payer: Self-pay | Admitting: *Deleted

## 2016-02-29 DIAGNOSIS — Z7982 Long term (current) use of aspirin: Secondary | ICD-10-CM

## 2016-02-29 DIAGNOSIS — R531 Weakness: Secondary | ICD-10-CM

## 2016-02-29 DIAGNOSIS — F0390 Unspecified dementia without behavioral disturbance: Secondary | ICD-10-CM | POA: Diagnosis present

## 2016-02-29 DIAGNOSIS — F0391 Unspecified dementia with behavioral disturbance: Secondary | ICD-10-CM | POA: Diagnosis present

## 2016-02-29 DIAGNOSIS — I495 Sick sinus syndrome: Secondary | ICD-10-CM | POA: Diagnosis present

## 2016-02-29 DIAGNOSIS — I160 Hypertensive urgency: Secondary | ICD-10-CM | POA: Diagnosis present

## 2016-02-29 DIAGNOSIS — Z886 Allergy status to analgesic agent status: Secondary | ICD-10-CM

## 2016-02-29 DIAGNOSIS — M25512 Pain in left shoulder: Secondary | ICD-10-CM | POA: Diagnosis not present

## 2016-02-29 DIAGNOSIS — Z8673 Personal history of transient ischemic attack (TIA), and cerebral infarction without residual deficits: Secondary | ICD-10-CM

## 2016-02-29 DIAGNOSIS — Z885 Allergy status to narcotic agent status: Secondary | ICD-10-CM | POA: Diagnosis not present

## 2016-02-29 DIAGNOSIS — R55 Syncope and collapse: Secondary | ICD-10-CM

## 2016-02-29 DIAGNOSIS — I10 Essential (primary) hypertension: Secondary | ICD-10-CM | POA: Diagnosis present

## 2016-02-29 DIAGNOSIS — I471 Supraventricular tachycardia: Secondary | ICD-10-CM | POA: Diagnosis present

## 2016-02-29 DIAGNOSIS — I4892 Unspecified atrial flutter: Secondary | ICD-10-CM | POA: Diagnosis present

## 2016-02-29 DIAGNOSIS — I480 Paroxysmal atrial fibrillation: Secondary | ICD-10-CM | POA: Diagnosis not present

## 2016-02-29 DIAGNOSIS — Z8249 Family history of ischemic heart disease and other diseases of the circulatory system: Secondary | ICD-10-CM | POA: Diagnosis not present

## 2016-02-29 DIAGNOSIS — Z79899 Other long term (current) drug therapy: Secondary | ICD-10-CM | POA: Diagnosis not present

## 2016-02-29 DIAGNOSIS — M6281 Muscle weakness (generalized): Secondary | ICD-10-CM | POA: Diagnosis not present

## 2016-02-29 DIAGNOSIS — R278 Other lack of coordination: Secondary | ICD-10-CM | POA: Diagnosis not present

## 2016-02-29 DIAGNOSIS — M810 Age-related osteoporosis without current pathological fracture: Secondary | ICD-10-CM | POA: Diagnosis present

## 2016-02-29 DIAGNOSIS — Z9849 Cataract extraction status, unspecified eye: Secondary | ICD-10-CM | POA: Diagnosis not present

## 2016-02-29 DIAGNOSIS — Z7901 Long term (current) use of anticoagulants: Secondary | ICD-10-CM | POA: Diagnosis not present

## 2016-02-29 DIAGNOSIS — I4891 Unspecified atrial fibrillation: Secondary | ICD-10-CM | POA: Diagnosis not present

## 2016-02-29 DIAGNOSIS — I340 Nonrheumatic mitral (valve) insufficiency: Secondary | ICD-10-CM | POA: Diagnosis present

## 2016-02-29 DIAGNOSIS — F418 Other specified anxiety disorders: Secondary | ICD-10-CM | POA: Diagnosis present

## 2016-02-29 DIAGNOSIS — I351 Nonrheumatic aortic (valve) insufficiency: Secondary | ICD-10-CM | POA: Diagnosis present

## 2016-02-29 DIAGNOSIS — Z881 Allergy status to other antibiotic agents status: Secondary | ICD-10-CM | POA: Diagnosis not present

## 2016-02-29 DIAGNOSIS — Z880 Allergy status to penicillin: Secondary | ICD-10-CM | POA: Diagnosis not present

## 2016-02-29 DIAGNOSIS — Z90710 Acquired absence of both cervix and uterus: Secondary | ICD-10-CM | POA: Diagnosis not present

## 2016-02-29 DIAGNOSIS — E7800 Pure hypercholesterolemia, unspecified: Secondary | ICD-10-CM | POA: Diagnosis present

## 2016-02-29 DIAGNOSIS — R778 Other specified abnormalities of plasma proteins: Secondary | ICD-10-CM | POA: Diagnosis present

## 2016-02-29 DIAGNOSIS — Z8271 Family history of polycystic kidney: Secondary | ICD-10-CM | POA: Diagnosis not present

## 2016-02-29 DIAGNOSIS — R001 Bradycardia, unspecified: Secondary | ICD-10-CM

## 2016-02-29 DIAGNOSIS — R7989 Other specified abnormal findings of blood chemistry: Secondary | ICD-10-CM | POA: Diagnosis not present

## 2016-02-29 DIAGNOSIS — I119 Hypertensive heart disease without heart failure: Secondary | ICD-10-CM | POA: Diagnosis not present

## 2016-02-29 DIAGNOSIS — R0600 Dyspnea, unspecified: Secondary | ICD-10-CM | POA: Diagnosis not present

## 2016-02-29 DIAGNOSIS — R41841 Cognitive communication deficit: Secondary | ICD-10-CM | POA: Diagnosis not present

## 2016-02-29 DIAGNOSIS — R2681 Unsteadiness on feet: Secondary | ICD-10-CM | POA: Diagnosis not present

## 2016-02-29 DIAGNOSIS — Z9181 History of falling: Secondary | ICD-10-CM

## 2016-02-29 DIAGNOSIS — Z841 Family history of disorders of kidney and ureter: Secondary | ICD-10-CM

## 2016-02-29 DIAGNOSIS — I4819 Other persistent atrial fibrillation: Secondary | ICD-10-CM | POA: Diagnosis present

## 2016-02-29 LAB — CREATININE, SERUM
Creatinine, Ser: 0.97 mg/dL (ref 0.44–1.00)
GFR calc Af Amer: 60 mL/min — ABNORMAL LOW (ref >60)
GFR calc non Af Amer: 51 mL/min — ABNORMAL LOW (ref >60)

## 2016-02-29 LAB — CBC
HCT: 36.5 % (ref 36.0–46.0)
HCT: 37.1 % (ref 36.0–46.0)
Hemoglobin: 11.7 g/dL — ABNORMAL LOW (ref 12.0–15.0)
Hemoglobin: 12 g/dL (ref 12.0–15.0)
MCH: 28.8 pg (ref 26.0–34.0)
MCH: 28.8 pg (ref 26.0–34.0)
MCHC: 32.1 g/dL (ref 30.0–36.0)
MCHC: 32.3 g/dL (ref 30.0–36.0)
MCV: 89.9 fL (ref 78.0–100.0)
MCV: 89 fL (ref 78.0–100.0)
PLATELETS: 214 10*3/uL (ref 150–400)
Platelets: 222 10*3/uL (ref 150–400)
RBC: 4.06 MIL/uL (ref 3.87–5.11)
RBC: 4.17 MIL/uL (ref 3.87–5.11)
RDW: 14.1 % (ref 11.5–15.5)
RDW: 14.1 % (ref 11.5–15.5)
WBC: 7.5 10*3/uL (ref 4.0–10.5)
WBC: 8.5 10*3/uL (ref 4.0–10.5)

## 2016-02-29 LAB — BASIC METABOLIC PANEL
Anion gap: 9 (ref 5–15)
BUN: 25 mg/dL — AB (ref 6–20)
CALCIUM: 9.3 mg/dL (ref 8.9–10.3)
CO2: 24 mmol/L (ref 22–32)
CREATININE: 1.06 mg/dL — AB (ref 0.44–1.00)
Chloride: 103 mmol/L (ref 101–111)
GFR calc Af Amer: 54 mL/min — ABNORMAL LOW (ref >60)
GFR, EST NON AFRICAN AMERICAN: 46 mL/min — AB (ref >60)
Glucose, Bld: 109 mg/dL — ABNORMAL HIGH (ref 65–99)
Potassium: 4.4 mmol/L (ref 3.5–5.1)
SODIUM: 136 mmol/L (ref 135–145)

## 2016-02-29 LAB — TROPONIN I: TROPONIN I: 0.1 ng/mL — AB (ref <0.031)

## 2016-02-29 MED ORDER — NITROGLYCERIN 0.4 MG SL SUBL: 0.4000 mg | SUBLINGUAL_TABLET | SUBLINGUAL | Status: DC | PRN

## 2016-02-29 MED ORDER — HEPARIN SODIUM (PORCINE) 5000 UNIT/ML IJ SOLN
5000.0000 [IU] | Freq: Three times a day (TID) | INTRAMUSCULAR | Status: DC
  Administered 2016-03-01 – 2016-03-02 (×5): 5000 [IU] via SUBCUTANEOUS
  Filled 2016-02-29 (×5): qty 1

## 2016-02-29 MED ORDER — RISPERIDONE 0.5 MG PO TABS
0.5000 mg | ORAL_TABLET | Freq: Every day | ORAL | Status: DC
  Administered 2016-03-01 – 2016-03-03 (×4): 0.5 mg via ORAL
  Filled 2016-02-29 (×4): qty 1

## 2016-02-29 MED ORDER — DILTIAZEM HCL 100 MG IV SOLR
10.0000 mg/h | INTRAVENOUS | Status: DC
  Administered 2016-03-01: 15 mg/h via INTRAVENOUS
  Administered 2016-03-01: 10 mg/h via INTRAVENOUS
  Administered 2016-03-01: 15 mg/h via INTRAVENOUS
  Administered 2016-03-01: 20 mg/h via INTRAVENOUS
  Administered 2016-03-02: 10 mg/h via INTRAVENOUS
  Administered 2016-03-02: 15 mg/h via INTRAVENOUS
  Administered 2016-03-02: 10 mg/h via INTRAVENOUS
  Filled 2016-02-29 (×8): qty 100

## 2016-02-29 MED ORDER — DILTIAZEM HCL 100 MG IV SOLR
5.0000 mg/h | INTRAVENOUS | Status: DC
  Administered 2016-02-29: 5 mg/h via INTRAVENOUS
  Filled 2016-02-29: qty 100

## 2016-02-29 MED ORDER — METOPROLOL SUCCINATE ER 50 MG PO TB24
50.0000 mg | ORAL_TABLET | Freq: Every day | ORAL | Status: DC
  Administered 2016-03-01: 50 mg via ORAL
  Filled 2016-02-29: qty 1

## 2016-02-29 MED ORDER — ACETAMINOPHEN 325 MG PO TABS
650.0000 mg | ORAL_TABLET | Freq: Four times a day (QID) | ORAL | Status: DC | PRN
Start: 2016-02-29 — End: 2016-02-29

## 2016-02-29 MED ORDER — ONDANSETRON HCL 4 MG/2ML IJ SOLN: 4.0000 mg | Freq: Four times a day (QID) | INTRAMUSCULAR | Status: DC | PRN

## 2016-02-29 MED ORDER — SODIUM CHLORIDE 0.9% FLUSH
3.0000 mL | Freq: Two times a day (BID) | INTRAVENOUS | Status: DC
  Administered 2016-03-01 – 2016-03-04 (×6): 3 mL via INTRAVENOUS

## 2016-02-29 MED ORDER — METOPROLOL TARTRATE 1 MG/ML IV SOLN
5.0000 mg | Freq: Once | INTRAVENOUS | Status: AC
  Administered 2016-02-29: 5 mg via INTRAVENOUS
  Filled 2016-02-29: qty 5

## 2016-02-29 MED ORDER — SODIUM CHLORIDE 0.9 % IV SOLN
250.0000 mL | INTRAVENOUS | Status: DC | PRN
  Administered 2016-03-02: 250 mL via INTRAVENOUS

## 2016-02-29 MED ORDER — SODIUM CHLORIDE 0.9% FLUSH: 3.0000 mL | INTRAVENOUS | Status: DC | PRN

## 2016-02-29 MED ORDER — DILTIAZEM LOAD VIA INFUSION
20.0000 mg | Freq: Once | INTRAVENOUS | Status: AC
  Administered 2016-02-29: 20 mg via INTRAVENOUS
  Filled 2016-02-29: qty 20

## 2016-02-29 MED ORDER — VENLAFAXINE HCL 37.5 MG PO TABS
37.5000 mg | ORAL_TABLET | Freq: Two times a day (BID) | ORAL | Status: DC
  Administered 2016-03-01 – 2016-03-04 (×8): 37.5 mg via ORAL
  Filled 2016-02-29 (×11): qty 1

## 2016-02-29 MED ORDER — ASPIRIN 325 MG PO TABS
325.0000 mg | ORAL_TABLET | Freq: Every day | ORAL | Status: DC
  Administered 2016-03-01 – 2016-03-02 (×2): 325 mg via ORAL
  Filled 2016-02-29 (×2): qty 1

## 2016-02-29 MED ORDER — DOCUSATE SODIUM 100 MG PO CAPS
100.0000 mg | ORAL_CAPSULE | Freq: Two times a day (BID) | ORAL | Status: DC
  Administered 2016-03-01 – 2016-03-04 (×8): 100 mg via ORAL
  Filled 2016-02-29 (×8): qty 1

## 2016-02-29 MED ORDER — ACETAMINOPHEN 325 MG PO TABS
650.0000 mg | ORAL_TABLET | ORAL | Status: DC | PRN
  Administered 2016-03-04: 650 mg via ORAL
  Filled 2016-02-29: qty 2

## 2016-02-29 NOTE — Telephone Encounter (Signed)
spoke to daughter States patient is take a afew steps c/o feeling faint , weakness, diaphoretic , pale No chest tightness , Daughter does not think patient is dehydrate,patient lives at assisted living.  RN recommend she go to ER , daughter states last time she went it was 6 hours . recommend to go to [facility] If cardiac will transfer to [place] if needed. Daughter verbalized understanding.

## 2016-02-29 NOTE — ED Notes (Signed)
Pt reports hx of afib. Pt reports dizziness and intermittent chest pain. Pt reports feeling her heart "race' today. Pt alert at triage.

## 2016-02-29 NOTE — ED Notes (Signed)
PA at bedside.

## 2016-02-29 NOTE — H&P (Signed)
Patient ID: Brittney Gutierrez MRN: [id number], DOB/AGE: 01-09-1927   Admit date: 02/29/2016   Primary Physician: Brittney Chandler, NP Primary Cardiologist: Dr Brittney Gutierrez  HPI:  82 y.o. female who has PAF with SSS, dementia, and HTN. She recently moved to the [place] area from [place] where she was being cared for by Dr. Chancy Gutierrez. A Pacemaker has been suggested in the past but the pt and family decided against it. She is not felt to be a candidate for anticoagulation secondary to dementia and fall risk. Her CHADs VASc score is at least4, 6 if she really did have a TIA as reported in history. In September 2016, she was hospitalized with chest discomfort in the setting of atrial fibrillation with rapid ventricular response and had a very minor increase in cardiac enzymes (troponin 0.2) felt to be secondary to demand ischemia. She converted spontaneously to sinus rhythm. Myoview in Sept 2016 showed no ischemia. Echo shoed preserved LVF with  Mild valvular disease.  She was admitted to [facility] in Jan 2017 with a similar episode. Rate control has been difficult and she has had significant bradycardia after conversion to sinus rhythm. Her Holter monitor showed a minimum heart rate of 45 bpm and an average heart rate of 60 bpm as well as multiple episodes of paroxysmal atrial fibrillation with rates in the 160s while on treatment with metoprolol and Diltiazem. LOV with Dr C was 01/11/26.         She was brought to the ED today by her family for increased weakness, DOE, and tachycardia. This was noted by the staff at [facility] independent living where the pt resides. She is pleasant and cooperative but unable to give me any history. In the ED her HR was 164- now 120 on IV Diltiazem.    Problem List: Past Medical History  Diagnosis Date  . Paroxysmal a-fib ([place])   . Hypertension   . Formed visual hallucinations     Brittney Gutierrez?  Failed Keppra and Depakote  . Macular degeneration   .  Diverticulitis 09/28/2007  . TIA (transient ischemic attack)   . Neurocysticercosis     Craniotomy at [facility] in early 2000s  . Depression 09/06/2002  . UTI (urinary tract infection)   . Paranoia ([place])   . Bradycardia, drug induced   . Leg pain, left   . Pure hypercholesterolemia 12/20/1999  . Anxiety 01/17/2003  . Cerebral atherosclerosis 09/24/1999  . Back pain 08/19/2005    with radiculopathy  . Chest pain, atypical   . Diverticulosis of colon 06/07/2003  . External hemorrhoids 04/12/2009  . Malaise and fatigue   . Osteoarthrosis, localized   . Osteoporosis 05/28/2004  . PSVT (paroxysmal supraventricular tachycardia) ([place]) 02/22/2002  . Palpitations 11/16/2001  . Female climacteric state 03/04/2000  . Abnormal weight loss 05/28/2004  . Urinary incontinence 08/18/2007  . Vaginitis, atrophic   . Vertigo, peripheral 10/07/2000  . Hallucination, visual   . Depression with anxiety     Past Surgical History  Procedure Laterality Date  . Shoulder surgery Right   . Knee surgery Left 1990  . Brain surgery  2000    Neurocysticercosis  . Vaginal hysterectomy  1960  . Rotator cuff repair  2000  . Cataract extraction  2013     Allergies:  Allergies  Allergen Reactions  . Amoxicillin Other (See Comments)    Unknown- ? Upset stomach  . Demerol [Meperidine]     See things     Home Medications Prior  to Admission medications   Medication Sig Start Date End Date Taking? Authorizing Provider  acetaminophen (TYLENOL) 325 MG tablet Take 650 mg by mouth every 6 (six) hours as needed for mild pain.   Yes Historical Provider, MD  aspirin 325 MG tablet Take 325 mg by mouth daily.   Yes Historical Provider, MD  diltiazem (DILTIAZEM CD) 240 MG 24 hr capsule Take 240 mg by mouth daily.   Yes Historical Provider, MD  docusate sodium (COLACE) 100 MG capsule Take 100 mg by mouth 2 (two) times daily.   Yes Historical Provider, MD  metoprolol succinate (TOPROL-XL) 50 MG 24 hr tablet Take 1 tablet daily.  Take with or immediately following a meal.   Yes Historical Provider, MD  risperiDONE (RISPERDAL) 0.5 MG tablet Take 0.5 mg by mouth at bedtime.   Yes Historical Provider, MD  venlafaxine (EFFEXOR) 37.5 MG tablet Take 37.5 mg by mouth 2 (two) times daily.   Yes Historical Provider, MD  donepezil (ARICEPT) 10 MG tablet 1/2 tablet by mouth in the evening for memory loss- after 4 weeks to increase to 1 tablet by mouth daily Patient not taking: Reported on 02/29/2016 02/08/16   Brittney Chandler, NP  Melatonin 3 MG CAPS Take 1 capsule (3 mg total) by mouth at bedtime as needed. Patient not taking: Reported on 02/29/2016 02/12/16   Brittney Chandler, NP     Family History  Problem Relation Age of Onset  . Dementia Neg Hx   . Polycystic kidney disease Mother   . Heart attack Father   . Hypertension Sister      Social History   Social History  . Marital Status: Widowed    Spouse Name: N/A  . Number of Children: N/A  . Years of Education: N/A   Occupational History  . Not on file.   Social History Main Topics  . Smoking status: Never Smoker   . Smokeless tobacco: Never Used  . Alcohol Use: No  . Drug Use: No  . Sexual Activity: Not on file   Other Topics Concern  . Not on file   Social History Narrative   DIET: None      DO YOU DRINK/EAT THINGS WITH CAFFEINE: yes      MARITAL STATUS: widow      WHAT YEAR WERE YOU MARRIED: 1948      DO YOU LIVE IN A HOUSE, APARTMENT, ASSISTED LIVING, CONDO TRAILER ETC.: Independent Living      IS IT ONE OR MORE STORIES: 1 story      HOW MANY PERSONS LIVE IN YOUR HOME: 1      DO YOU HAVE PETS IN YOUR HOME: no      CURRENT OR PAST PROFESSION: Book Keeper      DO YOU EXERCISE: no      WHAT TYPE AND HOW OFTEN:     Review of Systems: General: negative for chills, fever, night sweats or weight changes.  Cardiovascular: negative for chest pain, dyspnea on exertion, edema, orthopnea, palpitations, paroxysmal nocturnal dyspnea or shortness  of breath HEENT: negative for any visual disturbances, blindness, glaucoma Dermatological: negative for rash Respiratory: negative for cough, hemoptysis, or wheezing Urologic: negative for hematuria or dysuria Abdominal: negative for nausea, vomiting, diarrhea, bright red blood per rectum, melena, or hematemesis Neurologic: negative for visual changes, syncope, or dizziness Musculoskeletal: negative for back pain, joint pain, or swelling Psych: cooperative and appropriate All other systems reviewed and are otherwise negative except as noted above.  Physical  Exam: Blood pressure 134/80, pulse 67, temperature 97.5 F (36.4 C), temperature source Oral, resp. rate 24, SpO2 93 %.  General appearance: alert, cooperative and no distress Neck: no carotid bruit and no JVD Lungs: scattered crackles  Heart: irregularly irregular rhythm Abdomen: soft, non-tender; bowel sounds normal; no masses,  no organomegaly Extremities: no edema Pulses: 2+ and symmetric Skin: Skin color, texture, turgor normal. No rashes or lesions Neurologic: Grossly normal    Labs:   Results for orders placed or performed during the hospital encounter of 02/29/16 (from the past 24 hour(s))  Basic metabolic panel     Status: Abnormal   Collection Time: 02/29/16  5:13 PM  Result Value Ref Range   Sodium 136 135 - 145 mmol/L   Potassium 4.4 3.5 - 5.1 mmol/L   Chloride 103 101 - 111 mmol/L   CO2 24 22 - 32 mmol/L   Glucose, Bld 109 (H) 65 - 99 mg/dL   BUN 25 (H) 6 - 20 mg/dL   Creatinine, Ser 1.06 (H) 0.44 - 1.00 mg/dL   Calcium 9.3 8.9 - 10.3 mg/dL   GFR calc non Af Amer 46 (L) >60 mL/min   GFR calc Af Amer 54 (L) >60 mL/min   Anion gap 9 5 - 15  CBC     Status: Abnormal   Collection Time: 02/29/16  5:13 PM  Result Value Ref Range   WBC 7.5 4.0 - 10.5 K/uL   RBC 4.06 3.87 - 5.11 MIL/uL   Hemoglobin 11.7 (L) 12.0 - 15.0 g/dL   HCT 36.5 36.0 - 46.0 %   MCV 89.9 78.0 - 100.0 fL   MCH 28.8 26.0 - 34.0 pg   MCHC  32.1 30.0 - 36.0 g/dL   RDW 14.1 11.5 - 15.5 %   Platelets 214 150 - 400 K/uL  Troponin I     Status: Abnormal   Collection Time: 02/29/16  6:48 PM  Result Value Ref Range   Troponin I 0.10 (H) <0.031 ng/mL     Radiology/Studies: Dg Chest Port 1 View  02/29/2016  CLINICAL DATA:  Atrial fibrillation EXAM: PORTABLE CHEST 1 VIEW COMPARISON:  12/25/2015 FINDINGS: There is mild bilateral interstitial thickening. There is no pleural effusion or pneumothorax. There is stable cardiomegaly. There is loss of the normal right acromiohumeral distance. There is osteoarthritis of the right glenohumeral joint. IMPRESSION: 1. Cardiomegaly with pulmonary vascular congestion. Electronically Signed   By: Kathreen Devoid   On: 02/29/2016 18:38    EKG:AF with RVR, LVH with diffuse changes  ASSESSMENT AND PLAN:  Principal Problem:   Atrial fibrillation with RVR (HCC) Active Problems:   Sinus bradycardia seen on cardiac monitor   Essential hypertension   Dementia   Hypertensive cardiovascular disease-LVH   PLAN:   Admit to step down- rate control, monitor for bradycardia.    Henri Medal, PA-C 02/29/2016, 7:42 PM [phone]

## 2016-02-29 NOTE — ED Notes (Signed)
Pt denies CP or SOB

## 2016-02-29 NOTE — ED Provider Notes (Signed)
CSN: [id number]     Arrival date & time 02/29/16  1607 History   First MD Initiated Contact with Patient 02/29/16 1623     Chief Complaint  Patient presents with  . Atrial Fibrillation     (Consider location/radiation/quality/duration/timing/severity/associated sxs/prior Treatment) Patient is a 82 y.o. female presenting with atrial fibrillation. The history is provided by the patient and a relative.  Atrial Fibrillation Pertinent negatives include no chest pain, no abdominal pain, no headaches and no shortness of breath.  Patient with hx afib, presents stating that for the past 3 days she generally hasnt felt well, gen weakness, lightheaded at times when stands, mild sob w exertion, and today when put her hand to chest felt her heart beating fast. Symptoms had been moderate, worse today. Hx afib and feeling similar in past with afib.  Pt denies palpitations, or being able to tell exactly when she goes in to afib.  No chest pain. Compliant w normal meds, no recent change in meds or doses. No fever or chills. No leg swelling or pain.       Past Medical History  Diagnosis Date  . Paroxysmal a-fib ([place])   . Hypertension   . Formed visual hallucinations     [name]?  Failed Keppra and Depakote  . Macular degeneration   . Diverticulitis 09/28/2007  . TIA (transient ischemic attack)   . Neurocysticercosis     Craniotomy at [facility] in early 2000s  . Depression 09/06/2002  . UTI (urinary tract infection)   . Paranoia ([place])   . Bradycardia, drug induced   . Leg pain, left   . Pure hypercholesterolemia 12/20/1999  . Anxiety 01/17/2003  . Cerebral atherosclerosis 09/24/1999  . Back pain 08/19/2005    with radiculopathy  . Chest pain, atypical   . Diverticulosis of colon 06/07/2003  . External hemorrhoids 04/12/2009  . Malaise and fatigue   . Osteoarthrosis, localized   . Osteoporosis 05/28/2004  . PSVT (paroxysmal supraventricular tachycardia) ([place]) 02/22/2002  . Palpitations  11/16/2001  . Female climacteric state 03/04/2000  . Abnormal weight loss 05/28/2004  . Urinary incontinence 08/18/2007  . Vaginitis, atrophic   . Vertigo, peripheral 10/07/2000  . Hallucination, visual   . Depression with anxiety    Past Surgical History  Procedure Laterality Date  . Shoulder surgery Right   . Knee surgery Left 1990  . Brain surgery  2000    Neurocysticercosis  . Vaginal hysterectomy  1960  . Rotator cuff repair  2000  . Cataract extraction  2013   Family History  Problem Relation Age of Onset  . Dementia Neg Hx   . Polycystic kidney disease Mother   . Heart attack Father   . Hypertension Sister    Social History  Substance Use Topics  . Smoking status: Never Smoker   . Smokeless tobacco: Never Used  . Alcohol Use: No   OB History    No data available     Review of Systems  Constitutional: Negative for fever and chills.  HENT: Negative for sore throat.   Eyes: Negative for redness.  Respiratory: Negative for shortness of breath.   Cardiovascular: Negative for chest pain.  Gastrointestinal: Negative for vomiting and abdominal pain.  Genitourinary: Negative for flank pain.  Musculoskeletal: Negative for back pain and neck pain.  Skin: Negative for rash.  Neurological: Negative for headaches.  Hematological: Does not bruise/bleed easily.  Psychiatric/Behavioral: Negative for confusion.      Allergies  Amoxicillin and Demerol  Home  Medications   Prior to Admission medications   Medication Sig Start Date End Date Taking? Authorizing Provider  acetaminophen (TYLENOL) 325 MG tablet Take 650 mg by mouth every 6 (six) hours as needed for mild pain.    Historical Provider, MD  ALPRAZolam Duanne Moron) 0.5 MG tablet Take 0.5 mg by mouth 2 (two) times daily as needed for anxiety.    Historical Provider, MD  aspirin 325 MG tablet Take 325 mg by mouth daily.    Historical Provider, MD  diltiazem (DILTIAZEM CD) 240 MG 24 hr capsule Take 240 mg by mouth daily.     Historical Provider, MD  donepezil (ARICEPT) 10 MG tablet 1/2 tablet by mouth in the evening for memory loss- after 4 weeks to increase to 1 tablet by mouth daily 02/08/16   Lauree Chandler, NP  Melatonin 3 MG CAPS Take 1 capsule (3 mg total) by mouth at bedtime as needed. 02/12/16   Lauree Chandler, NP  metoprolol succinate (TOPROL-XL) 50 MG 24 hr tablet Take 1 tablet daily. Take with or immediately following a meal.    Historical Provider, MD  Multiple Vitamins-Minerals (PRESERVISION AREDS 2 PO) Take 1 tablet by mouth 2 (two) times daily.     Historical Provider, MD  risperiDONE (RISPERDAL) 0.5 MG tablet Take 0.5 mg by mouth at bedtime.    Historical Provider, MD  venlafaxine (EFFEXOR) 37.5 MG tablet Take 37.5 mg by mouth 2 (two) times daily.    Historical Provider, MD   BP 129/103 mmHg  Pulse 141  Temp(Src) 97.5 F (36.4 C) (Oral)  Resp 16  SpO2 96% Physical Exam  Constitutional: She appears well-developed and well-nourished.  Tachycardic.   HENT:  Mouth/Throat: Oropharynx is clear and moist.  Eyes: Conjunctivae are normal. No scleral icterus.  Neck: Neck supple. No tracheal deviation present.  Cardiovascular: Normal heart sounds and intact distal pulses.   Tacycardic, irregular.   Pulmonary/Chest: Effort normal and breath sounds normal. No respiratory distress.  Abdominal: Soft. Normal appearance and bowel sounds are normal. She exhibits no distension. There is no tenderness.  Musculoskeletal: She exhibits no edema.  Neurological: She is alert.  Skin: Skin is warm and dry. No rash noted.  Psychiatric: She has a normal mood and affect.  Nursing note and vitals reviewed.   ED Course  Procedures (including critical care time) Labs Review  Results for orders placed or performed during the hospital encounter of [id number]  Basic metabolic panel  Result Value Ref Range   Sodium 136 135 - 145 mmol/L   Potassium 4.4 3.5 - 5.1 mmol/L   Chloride 103 101 - 111 mmol/L   CO2 24 22 - 32  mmol/L   Glucose, Bld 109 (H) 65 - 99 mg/dL   BUN 25 (H) 6 - 20 mg/dL   Creatinine, Ser 1.06 (H) 0.44 - 1.00 mg/dL   Calcium 9.3 8.9 - 10.3 mg/dL   GFR calc non Af Amer 46 (L) >60 mL/min   GFR calc Af Amer 54 (L) >60 mL/min   Anion gap 9 5 - 15  CBC  Result Value Ref Range   WBC 7.5 4.0 - 10.5 K/uL   RBC 4.06 3.87 - 5.11 MIL/uL   Hemoglobin 11.7 (L) 12.0 - 15.0 g/dL   HCT 36.5 36.0 - 46.0 %   MCV 89.9 78.0 - 100.0 fL   MCH 28.8 26.0 - 34.0 pg   MCHC 32.1 30.0 - 36.0 g/dL   RDW 14.1 11.5 - 15.5 %   Platelets  214 150 - 400 K/uL   Dg Chest Port 1 View  02/29/2016  CLINICAL DATA:  Atrial fibrillation EXAM: PORTABLE CHEST 1 VIEW COMPARISON:  12/25/2015 FINDINGS: There is mild bilateral interstitial thickening. There is no pleural effusion or pneumothorax. There is stable cardiomegaly. There is loss of the normal right acromiohumeral distance. There is osteoarthritis of the right glenohumeral joint. IMPRESSION: 1. Cardiomegaly with pulmonary vascular congestion. Electronically Signed   By: Kathreen Devoid   On: 02/29/2016 18:38       I have personally reviewed and evaluated these lab results as part of my medical decision-making.   EKG Interpretation   Date/Time:  Thursday February 29 2016 16:20:07 EDT Ventricular Rate:  164 PR Interval:    QRS Duration: 104 QT Interval:  250 QTC Calculation: 412 R Axis:   -33 Text Interpretation:  Atrial fibrillation with rapid ventricular response  Left axis deviation Left ventricular hypertrophy Non-specific ST-t changes  Confirmed by Ashok Cordia  MD, Lennette Bihari ([id number]) on 02/29/2016 4:24:20 PM      MDM   Iv ns. Continuous pulse ox and monitor. o2 [place].  Ecg.  Labs sent.  Reviewed nursing notes and prior charts for additional history.   Pt with hx afib. On asa.   cardizem iv bolus and gtt.  Patient symptomatic w very rapid atrial fibrillation.  After initial iv bolus and gtt, hr improved but still 130's.   cardizem gtt increased to 10 mg/hr.   Pt given a dose of iv metoprolol  Patients cardiology service consulted - they will evaluate in ED/admit.  CRITICAL CARE  RE atrial fibrillation with very rapid ventricular response, weakness/near syncope, dyspnea.  Performed by: Mirna Mires Total critical care time: 35 minutes Critical care time was exclusive of separately billable procedures and treating other patients. Critical care was necessary to treat or prevent imminent or life-threatening deterioration. Critical care was time spent personally by me on the following activities: development of treatment plan with patient and/or surrogate as well as nursing, discussions with consultants, evaluation of patient's response to treatment, examination of patient, obtaining history from patient or surrogate, ordering and performing treatments and interventions, ordering and review of laboratory studies, ordering and review of radiographic studies, pulse oximetry and re-evaluation of patient's condition.    Lajean Saver, MD 02/29/16 [phone]

## 2016-02-29 NOTE — Telephone Encounter (Signed)
[name] called in stating that Ms. Berendt's condition seems to be worsening, every few steps she takes it seems like to is gong to faint. She wanted to bring her in today if possible.

## 2016-03-01 ENCOUNTER — Inpatient Hospital Stay (HOSPITAL_COMMUNITY): Payer: Medicare Other

## 2016-03-01 ENCOUNTER — Ambulatory Visit: Payer: Medicare Other | Admitting: Cardiovascular Disease

## 2016-03-01 DIAGNOSIS — I4891 Unspecified atrial fibrillation: Secondary | ICD-10-CM

## 2016-03-01 DIAGNOSIS — R7989 Other specified abnormal findings of blood chemistry: Secondary | ICD-10-CM

## 2016-03-01 LAB — MAGNESIUM: Magnesium: 1.8 mg/dL (ref 1.7–2.4)

## 2016-03-01 LAB — COMPREHENSIVE METABOLIC PANEL
ALT: 27 U/L (ref 14–54)
AST: 25 U/L (ref 15–41)
Albumin: 3 g/dL — ABNORMAL LOW (ref 3.5–5.0)
Alkaline Phosphatase: 91 U/L (ref 38–126)
Anion gap: 10 (ref 5–15)
BUN: 22 mg/dL — ABNORMAL HIGH (ref 6–20)
CO2: 23 mmol/L (ref 22–32)
Calcium: 9 mg/dL (ref 8.9–10.3)
Chloride: 102 mmol/L (ref 101–111)
Creatinine, Ser: 0.92 mg/dL (ref 0.44–1.00)
GFR calc Af Amer: >60 mL/min (ref >60)
GFR calc non Af Amer: 55 mL/min — ABNORMAL LOW (ref >60)
Glucose, Bld: 124 mg/dL — ABNORMAL HIGH (ref 65–99)
Potassium: 4.1 mmol/L (ref 3.5–5.1)
Sodium: 135 mmol/L (ref 135–145)
Total Bilirubin: 0.7 mg/dL (ref 0.3–1.2)
Total Protein: 6.5 g/dL (ref 6.5–8.1)

## 2016-03-01 LAB — TROPONIN I: Troponin I: 0.14 ng/mL — ABNORMAL HIGH (ref <0.031)

## 2016-03-01 LAB — ECHOCARDIOGRAM COMPLETE
HEIGHTINCHES: 62 in
WEIGHTICAEL: 2201.07 [oz_av]

## 2016-03-01 LAB — TSH: TSH: 4.447 u[IU]/mL (ref 0.350–4.500)

## 2016-03-01 LAB — MRSA PCR SCREENING: MRSA by PCR: NEGATIVE

## 2016-03-01 LAB — BRAIN NATRIURETIC PEPTIDE: B Natriuretic Peptide: 607.6 pg/mL — ABNORMAL HIGH (ref 0.0–100.0)

## 2016-03-01 MED ORDER — METOPROLOL TARTRATE 1 MG/ML IV SOLN
5.0000 mg | Freq: Once | INTRAVENOUS | Status: AC
  Administered 2016-03-01: 5 mg via INTRAVENOUS
  Filled 2016-03-01: qty 5

## 2016-03-01 MED ORDER — METOPROLOL TARTRATE 1 MG/ML IV SOLN
5.0000 mg | Freq: Once | INTRAVENOUS | Status: AC
  Administered 2016-03-01: 5 mg via INTRAVENOUS

## 2016-03-01 MED ORDER — METOPROLOL TARTRATE 1 MG/ML IV SOLN
INTRAVENOUS | Status: AC
  Filled 2016-03-01: qty 5

## 2016-03-01 MED ORDER — OFF THE BEAT BOOK
Freq: Once | Status: AC
Start: 2016-03-01 — End: 2016-03-01
  Administered 2016-03-01: 08:00:00
  Filled 2016-03-01: qty 1

## 2016-03-01 MED ORDER — METOPROLOL TARTRATE 12.5 MG HALF TABLET
12.5000 mg | ORAL_TABLET | Freq: Four times a day (QID) | ORAL | Status: DC
  Administered 2016-03-02 – 2016-03-04 (×11): 12.5 mg via ORAL
  Filled 2016-03-01 (×10): qty 1

## 2016-03-01 MED ORDER — ADENOSINE 6 MG/2ML IV SOLN
6.0000 mg | Freq: Once | INTRAVENOUS | Status: AC
  Administered 2016-03-01: 6 mg via INTRAVENOUS

## 2016-03-01 MED ORDER — EPINEPHRINE HCL 0.1 MG/ML IJ SOSY
PREFILLED_SYRINGE | INTRAMUSCULAR | Status: AC
  Filled 2016-03-01: qty 10

## 2016-03-01 MED ORDER — FUROSEMIDE 10 MG/ML IJ SOLN
40.0000 mg | Freq: Once | INTRAMUSCULAR | Status: AC
  Administered 2016-03-01: 40 mg via INTRAVENOUS
  Filled 2016-03-01: qty 4

## 2016-03-01 MED ORDER — MAGNESIUM SULFATE 2 GM/50ML IV SOLN
INTRAVENOUS | Status: AC
  Filled 2016-03-01: qty 50

## 2016-03-01 MED ORDER — CETYLPYRIDINIUM CHLORIDE 0.05 % MT LIQD
7.0000 mL | Freq: Two times a day (BID) | OROMUCOSAL | Status: DC
  Administered 2016-03-01 – 2016-03-04 (×6): 7 mL via OROMUCOSAL

## 2016-03-01 MED ORDER — MAGNESIUM SULFATE 2 GM/50ML IV SOLN
2.0000 g | Freq: Once | INTRAVENOUS | Status: AC
  Administered 2016-03-01: 2 g via INTRAVENOUS

## 2016-03-01 MED ORDER — SODIUM CHLORIDE 0.9 % IV BOLUS (SEPSIS): 250.0000 mL | Freq: Once | INTRAVENOUS | Status: DC

## 2016-03-01 NOTE — Progress Notes (Signed)
Subjective: Pt comfortable  No CP  No SOB  No dizzienss  No palpitations   Objective: Filed Vitals:   03/01/16 0624 03/01/16 0625 03/01/16 0630 03/01/16 0700  BP: 92/66 105/74 110/74 114/80  Pulse: 155 152 152 152  Temp:      TempSrc:      Resp: 19 24 22 20   Height:      Weight:      SpO2: 93% 93% 92% 91%   Weight change:   Intake/Output Summary (Last 24 hours) at 03/01/16 0752 Last data filed at 03/01/16 0600  Gross per 24 hour  Intake    215 ml  Output   1150 ml  Net   -935 ml    General: Alert, awake, oriented x3, in no acute distress Neck:  JVP is normal Heart: Regular rate and rhythm, without murmurs, rubs, gallops.  Lungs: Clear to auscultation.  No rales or wheezes. Exemities:  No edema.   Neuro: Grossly intact, nonfocal.  Tele:  Afib/ A flutter 100s to 150s   Lab Results: Results for orders placed or performed during the hospital encounter of 02/29/16 (from the past 24 hour(s))  Basic metabolic panel     Status: Abnormal   Collection Time: 02/29/16  5:13 PM  Result Value Ref Range   Sodium 136 135 - 145 mmol/L   Potassium 4.4 3.5 - 5.1 mmol/L   Chloride 103 101 - 111 mmol/L   CO2 24 22 - 32 mmol/L   Glucose, Bld 109 (H) 65 - 99 mg/dL   BUN 25 (H) 6 - 20 mg/dL   Creatinine, Ser 1.06 (H) 0.44 - 1.00 mg/dL   Calcium 9.3 8.9 - 10.3 mg/dL   GFR calc non Af Amer 46 (L) >60 mL/min   GFR calc Af Amer 54 (L) >60 mL/min   Anion gap 9 5 - 15  CBC     Status: Abnormal   Collection Time: 02/29/16  5:13 PM  Result Value Ref Range   WBC 7.5 4.0 - 10.5 K/uL   RBC 4.06 3.87 - 5.11 MIL/uL   Hemoglobin 11.7 (L) 12.0 - 15.0 g/dL   HCT 36.5 36.0 - 46.0 %   MCV 89.9 78.0 - 100.0 fL   MCH 28.8 26.0 - 34.0 pg   MCHC 32.1 30.0 - 36.0 g/dL   RDW 14.1 11.5 - 15.5 %   Platelets 214 150 - 400 K/uL  Brain natriuretic peptide     Status: Abnormal   Collection Time: 02/29/16  5:13 PM  Result Value Ref Range   B Natriuretic Peptide 607.6 (H) 0.0 - 100.0 pg/mL  Troponin I      Status: Abnormal   Collection Time: 02/29/16  6:48 PM  Result Value Ref Range   Troponin I 0.10 (H) <0.031 ng/mL  CBC     Status: None   Collection Time: 02/29/16  8:56 PM  Result Value Ref Range   WBC 8.5 4.0 - 10.5 K/uL   RBC 4.17 3.87 - 5.11 MIL/uL   Hemoglobin 12.0 12.0 - 15.0 g/dL   HCT 37.1 36.0 - 46.0 %   MCV 89.0 78.0 - 100.0 fL   MCH 28.8 26.0 - 34.0 pg   MCHC 32.3 30.0 - 36.0 g/dL   RDW 14.1 11.5 - 15.5 %   Platelets 222 150 - 400 K/uL  Creatinine, serum     Status: Abnormal   Collection Time: 02/29/16  8:56 PM  Result Value Ref Range   Creatinine, Ser 0.97 0.44 -  1.00 mg/dL   GFR calc non Af Amer 51 (L) >60 mL/min   GFR calc Af Amer 60 (L) >60 mL/min  MRSA PCR Screening     Status: None   Collection Time: 02/29/16 11:03 PM  Result Value Ref Range   MRSA by PCR NEGATIVE NEGATIVE  Magnesium     Status: None   Collection Time: 03/01/16  3:30 AM  Result Value Ref Range   Magnesium 1.8 1.7 - 2.4 mg/dL  Troponin I     Status: Abnormal   Collection Time: 03/01/16  3:30 AM  Result Value Ref Range   Troponin I 0.14 (H) <0.031 ng/mL  Comprehensive metabolic panel     Status: Abnormal   Collection Time: 03/01/16  3:30 AM  Result Value Ref Range   Sodium 135 135 - 145 mmol/L   Potassium 4.1 3.5 - 5.1 mmol/L   Chloride 102 101 - 111 mmol/L   CO2 23 22 - 32 mmol/L   Glucose, Bld 124 (H) 65 - 99 mg/dL   BUN 22 (H) 6 - 20 mg/dL   Creatinine, Ser 0.92 0.44 - 1.00 mg/dL   Calcium 9.0 8.9 - 10.3 mg/dL   Total Protein 6.5 6.5 - 8.1 g/dL   Albumin 3.0 (L) 3.5 - 5.0 g/dL   AST 25 15 - 41 U/L   ALT 27 14 - 54 U/L   Alkaline Phosphatase 91 38 - 126 U/L   Total Bilirubin 0.7 0.3 - 1.2 mg/dL   GFR calc non Af Amer 55 (L) >60 mL/min   GFR calc Af Amer >60 >60 mL/min   Anion gap 10 5 - 15  TSH     Status: None   Collection Time: 03/01/16  3:30 AM  Result Value Ref Range   TSH 4.447 0.350 - 4.500 uIU/mL    Studies/Results: Dg Chest Port 1 View  02/29/2016  CLINICAL DATA:   Atrial fibrillation EXAM: PORTABLE CHEST 1 VIEW COMPARISON:  12/25/2015 FINDINGS: There is mild bilateral interstitial thickening. There is no pleural effusion or pneumothorax. There is stable cardiomegaly. There is loss of the normal right acromiohumeral distance. There is osteoarthritis of the right glenohumeral joint. IMPRESSION: 1. Cardiomegaly with pulmonary vascular congestion. Electronically Signed   By: Kathreen Devoid   On: 02/29/2016 18:38    Medications:Reviewed   @PROBHOSP @  1  Rhythm Afib/ afllutter  CHADSVASc 6   Pts HR is up and down  100s to 150s  She is asymptmatic in bed.  On Dilt gtt 20 mg/hour  BP 90s to 110s  Have asked EP to see pt  She reportedly has history of tachy brady in past  Yesterday HR reported 45   Makes med Rx diffiuclt. Note that she was not felt to be a anticoag candidate due to fall risk and mild dementia.  2  Trop elevation  Minimal  Pt asymptomatic  May be related to rapid rates   LOS: 1 day   Dorris Carnes 03/01/2016, 7:52 AM

## 2016-03-01 NOTE — Progress Notes (Signed)
Dr. Bertram Denver in with order for adenosine IV x1. EKG at the bedside,placed patient continously, 1 dose of 6mg . IV push followed by NS 70ml. Pt.s HR slows down for short period and back to the 150's again. EKG shows patient in atrial fib when HR slows down . Dr Bertram Denver will update the daughter patient will be needing further intervention, stable at present denies any symptoms. Will follow up with cardiology this am per Dr. Bertram Denver.

## 2016-03-01 NOTE — Care Management Note (Signed)
Case Management Note  Patient Details  Name: Brittney Gutierrez MRN: [id number] Date of Birth: 1927-07-24  Subjective/Objective:      Adm w at fib              Action/Plan: from [facility]   Expected Discharge Date:                  Expected Discharge Plan:  [place]  In-House Referral:  Clinical Social Work  Discharge planning Services  CM Consult  Post Acute Care Choice:    Choice offered to:     DME Arranged:    DME Agency:     HH Arranged:    [place] Agency:     Status of Service:     Medicare Important Message Given:    Date Medicare IM Given:    Medicare IM give by:    Date Additional Medicare IM Given:    Additional Medicare Important Message give by:     If discussed at [place] of Stay Meetings, dates discussed:    Additional Comments: ur review done  Lacretia Leigh, RN 03/01/2016, 8:07 AM

## 2016-03-01 NOTE — Progress Notes (Signed)
  Echocardiogram 2D Echocardiogram has been performed.  Bobbye Charleston 03/01/2016, 1:29 PM

## 2016-03-01 NOTE — Consult Note (Signed)
ELECTROPHYSIOLOGY CONSULT NOTE    Patient ID: Brittney Gutierrez MRN: [id number], DOB/AGE: [birth year] 80 y.o.  Admit date: 02/29/2016 Date of Consult: 03/01/2016  Primary Physician: Lauree Chandler, NP Primary Cardiologist: Dr. Sallyanne Kuster  Reason for Consultation: AFib, RVR  HPI: Brittney Gutierrez is a 82 y.o. female with PMHx of PAFib with reports of SSS and historically recommended PPM but family preferred not to pursue it, she has HTN and questionable hx of TIA is mentioned, she has a CHA2DS2Vasc score of at least 4 (if + TIA would be 7) though her outpatient physicians felt her to be a high risk patient for a/c secondary to her dementia and fall risk.    In September 2016, she was hospitalized with chest discomfort in the setting of atrial fibrillation with rapid ventricular response and had a very minor increase in cardiac enzymes (troponin 0.2) felt to be secondary to demand ischemia. She converted spontaneously to sinus rhythm. Myoview in Sept 2016 showed no ischemia. Echo shoed preserved LVF with Mild valvular disease. She was admitted to [facility] in Jan 2017 with a similar episode. Rate control has been difficult and she has had significant bradycardia after conversion to sinus rhythm. Her Holter monitor showed a minimum heart rate of 45 bpm and an average heart rate of 60 bpm as well as multiple episodes of paroxysmal atrial fibrillation with rates in the 160s while on treatment with metoprolol and Diltiazem.   She was noted by family/staff at her nursing home to have increase weakness and DOE, noted to be tachycardic and brought to the ER, she wasfound in AF with HR 160's, placed in diltiazem gtt, currently 100-120bpm. On 80ml/hr diltiazem gtt with Toprol XL 50mg  daily.  Her home rate controlling meds were Diltiazem CD 240 and Toprol XL 50  The patient this morning is feeling well, she denies any kind of CP, at this time at rest she denies SOB.  ANd at this time, no palpitations.  The  patient and daughter relate that typically she can ambulate pretty well without difficulty and in the last couple days getting winded.   Past Medical History  Diagnosis Date  . Paroxysmal a-fib ([place])   . Hypertension   . Formed visual hallucinations     Brittney Gutierrez?  Failed Keppra and Depakote  . Macular degeneration   . Diverticulitis 09/28/2007  . TIA (transient ischemic attack)   . Neurocysticercosis     Craniotomy at [facility] in early 2000s  . Depression 09/06/2002  . UTI (urinary tract infection)   . Paranoia ([place])   . Bradycardia, drug induced   . Leg pain, left   . Pure hypercholesterolemia 12/20/1999  . Anxiety 01/17/2003  . Cerebral atherosclerosis 09/24/1999  . Back pain 08/19/2005    with radiculopathy  . Chest pain, atypical   . Diverticulosis of colon 06/07/2003  . External hemorrhoids 04/12/2009  . Malaise and fatigue   . Osteoarthrosis, localized   . Osteoporosis 05/28/2004  . PSVT (paroxysmal supraventricular tachycardia) ([place]) 02/22/2002  . Palpitations 11/16/2001  . Female climacteric state 03/04/2000  . Abnormal weight loss 05/28/2004  . Urinary incontinence 08/18/2007  . Vaginitis, atrophic   . Vertigo, peripheral 10/07/2000  . Hallucination, visual   . Depression with anxiety      Surgical History:  Past Surgical History  Procedure Laterality Date  . Shoulder surgery Right   . Knee surgery Left 1990  . Brain surgery  2000    Neurocysticercosis  . Vaginal hysterectomy  1960  .  Rotator cuff repair  2000  . Cataract extraction  2013     Prescriptions prior to admission  Medication Sig Dispense Refill Last Dose  . acetaminophen (TYLENOL) 325 MG tablet Take 650 mg by mouth every 6 (six) hours as needed for mild pain.   02/28/2016 at Unknown time  . aspirin 325 MG tablet Take 325 mg by mouth daily.   02/29/2016 at Unknown time  . diltiazem (DILTIAZEM CD) 240 MG 24 hr capsule Take 240 mg by mouth daily.   02/29/2016 at Unknown time  . docusate sodium (COLACE)  100 MG capsule Take 100 mg by mouth 2 (two) times daily.   02/29/2016 at Unknown time  . metoprolol succinate (TOPROL-XL) 50 MG 24 hr tablet Take 1 tablet daily. Take with or immediately following a meal.   02/29/2016 at 0800  . risperiDONE (RISPERDAL) 0.5 MG tablet Take 0.5 mg by mouth at bedtime.   02/28/2016 at Unknown time  . venlafaxine (EFFEXOR) 37.5 MG tablet Take 37.5 mg by mouth 2 (two) times daily.   02/29/2016 at Unknown time  . donepezil (ARICEPT) 10 MG tablet 1/2 tablet by mouth in the evening for memory loss- after 4 weeks to increase to 1 tablet by mouth daily (Patient not taking: Reported on 02/29/2016) 30 tablet 2 Not Taking at Unknown time  . Melatonin 3 MG CAPS Take 1 capsule (3 mg total) by mouth at bedtime as needed. (Patient not taking: Reported on 02/29/2016)  0 Not Taking at Unknown time    Inpatient Medications:  . antiseptic oral rinse  7 mL Mouth Rinse BID  . aspirin  325 mg Oral Daily  . docusate sodium  100 mg Oral BID  . heparin  5,000 Units Subcutaneous 3 times per day  . metoprolol succinate  50 mg Oral Daily  . off the beat book   Does not apply Once  . risperiDONE  0.5 mg Oral QHS  . sodium chloride flush  3 mL Intravenous Q12H  . venlafaxine  37.5 mg Oral BID    Allergies:  Allergies  Allergen Reactions  . Amoxicillin Other (See Comments)    Unknown- ? Upset stomach  . Demerol [Meperidine]     See things    Social History   Social History  . Marital Status: Widowed    Spouse Name: N/A  . Number of Children: N/A  . Years of Education: N/A   Occupational History  . Not on file.   Social History Main Topics  . Smoking status: Never Smoker   . Smokeless tobacco: Never Used  . Alcohol Use: No  . Drug Use: No  . Sexual Activity: Not on file   Other Topics Concern  . Not on file   Social History Narrative   DIET: None      DO YOU DRINK/EAT THINGS WITH CAFFEINE: yes      MARITAL STATUS: widow      WHAT YEAR WERE YOU MARRIED: 1948      DO  YOU LIVE IN A HOUSE, APARTMENT, ASSISTED LIVING, CONDO TRAILER ETC.: Independent Living      IS IT ONE OR MORE STORIES: 1 story      HOW MANY PERSONS LIVE IN YOUR HOME: 1      DO YOU HAVE PETS IN YOUR HOME: no      CURRENT OR PAST PROFESSION: Book Keeper      DO YOU EXERCISE: no      WHAT TYPE AND HOW OFTEN:  Family History  Problem Relation Age of Onset  . Dementia Neg Hx   . Polycystic kidney disease Mother   . Heart attack Father   . Hypertension Sister      Review of Systems: All other systems reviewed and are otherwise negative except as noted above.  Physical Exam: Filed Vitals:   03/01/16 0630 03/01/16 0700 03/01/16 0800 03/01/16 0900  BP: 110/74 114/80 111/75 106/70  Pulse: 152 152 107 57  Temp:      TempSrc:      Resp: 22 20 19 18   Height:      Weight:      SpO2: 92% 91% 91% 93%    GEN- The patient is elderly, well appearing, looks her age, alert and oriented x 3 today.   HEENT: normocephalic, atraumatic; sclera clear, conjunctiva pink; hearing intact; oropharynx clear; neck supple, no JVP Lymph- no cervical lymphadenopathy Lungs- fine crackles at the bases, normal work of breathing.  No wheezes, rales, rhonchi Heart- Irregular rate and rhythm, tachycardic, no murmurs, rubs or gallops, PMI not laterally displaced GI- soft, non-tender, non-distended, bowel sounds present Extremities- no clubbing, cyanosis, or edema; DP/PT/radial pulses 2+ bilaterally MS- no significant deformity or atrophy Skin- warm and dry, no rash or lesion Psych- euthymic mood, full affect Neuro- no gross deficits observed  Labs:   Lab Results  Component Value Date   WBC 8.5 02/29/2016   HGB 12.0 02/29/2016   HCT 37.1 02/29/2016   MCV 89.0 02/29/2016   PLT 222 02/29/2016    Recent Labs Lab 03/01/16 0330  NA 135  K 4.1  CL 102  CO2 23  BUN 22*  CREATININE 0.92  CALCIUM 9.0  PROT 6.5  BILITOT 0.7  ALKPHOS 91  ALT 27  AST 25  GLUCOSE 124*        Radiology/Studies:  Dg Chest Port 1 View 02/29/2016  CLINICAL DATA:  Atrial fibrillation EXAM: PORTABLE CHEST 1 VIEW COMPARISON:  12/25/2015 FINDINGS: There is mild bilateral interstitial thickening. There is no pleural effusion or pneumothorax. There is stable cardiomegaly. There is loss of the normal right acromiohumeral distance. There is osteoarthritis of the right glenohumeral joint. IMPRESSION: 1. Cardiomegaly with pulmonary vascular congestion. Electronically Signed   By: Kathreen Devoid   On: 02/29/2016 18:38    EKG: AFib 164bpm TELEMETRY: currently AFib, 110-120  09/01/13: Echocardiogram LVEF 60-65%, LA mildly dilated, mild-mod MR RV 45-9mmHg  Assessment and Plan:   1. PAFib historically felt to have SSS with SB reported in the 40's as well as AFib RVR     Dr. Lurline Del note discussed her holter with brady rates noted during sleep, and no symptoms of bradycardia reported to him or now.      Dr. Debara Pickett observed rates 45-160 AFlutter    Historically the patient/family have declined PPM implant    BP is stable, rates currently improving to low 100s while I am in the room    The patient and her daughter at bedside are more open to the idea of a PPM though prefer if possible to further discuss this with Dr. Loletha Grayer, and if needed pursue this with Dr. Loletha Grayer if at all possible.    Await Dr. Curt Bears and d/w Dr. Loletha Grayer at the patient's request for final POC  2. Minimal Trop elevation     felt to be secondary to RAFib,      No CP  3. Pulm Congestion     S/p lasix, no SOB     .  Venetia Night, PA-C 03/01/2016 10:39 AM  I have seen and examined this patient with Tommye Standard.  Agree with above, note added to reflect my findings.  On exam, tachycardic, irregular, no murmurs.  Patient with significant issues of AF with RVR.  Possibly bradycardic by history.  May need pacemaker but has refused in the past.  Has fall history but high CHADS2VASc of 4, possibly 6 with ?TIA.  Will plan for anticoagulation  through the weekend and TEE/CV next week.  Will then plan for oral anticoagulation with likely amiodarone for rhythm control.  Does have a fall risk but rhythm control may be better for her than rate control as she does have a history of bradycardia.    Will M. Camnitz MD 03/01/2016 9:08 PM

## 2016-03-01 NOTE — Progress Notes (Signed)
Patient HR has been in the 120's-160's occasionally in the 110's, afib on continues cardizem drip, pt. Asymptomatic except for oxygen saturation in the low 89-91%  at 2l. O2 increased to 4 liters sats up to 93-95%. Dr. Bertram Denver was notified with orders made. Lopressor 5mg . IV x1 given and lasix 40mg . IV x1 given. Will continue to monitor patient.

## 2016-03-01 NOTE — Progress Notes (Signed)
Nutrition Brief Note  Patient identified on the Malnutrition Screening Tool (MST) Report  Wt Readings from Last 15 Encounters:  02/29/16 137 lb 9.1 oz (62.4 kg)  02/13/16 132 lb (59.875 kg)  02/08/16 136 lb (61.689 kg)  01/16/16 134 lb 9.6 oz (61.054 kg)  01/12/16 133 lb 7 oz (60.527 kg)  12/28/15 120 lb (54.432 kg)   82 y.o. female who has PAF with SSS, dementia, and HTN. She recently moved to the [place] area from [place] where she was being cared for by Dr. Chancy Milroy. A Pacemaker has been suggested in the past but the pt and family decided against it. She is not felt to be a candidate for anticoagulation secondary to dementia and fall risk. Her CHADs VASc score is at least4, 6 if she really did have a TIA as reported in history.   Pt admitted with a-fib with RVR. Pt is a resident of [facility].   Spoke with RN, who reports pt is NPO for potential procedure today. Diet has since been advanced to Heart Healthy.   Spoke with pt and pt daughter at bedside. Pt has been living at [facility] over the past 3 months. Pt daughter reports pt is on a meal plan where she receives 2 meals per day, but often does not like the food choices offered in the dining area. Breakfast consists of cereal and coffee, lunch/dinner: consists of a sandwich or dining area meal. She also sometimes supplements Ensure as a meal replacement.   Pt daughter reports mild wt loss due to constipation issues. She reports UBW is around 125-130#.  Nutrition-Focused physical exam completed. Findings are mild fat depletion, mild muscle depletion, and no edema. Suspect fat and muscle loss is due to advanced age. Pt daughter shares that pt has been receiving therapy at home and this have improved her independence greatly.   Labs reviewed: Glucose: 124.   Body mass index is 25.15 kg/(m^2). Patient meets criteria for overweight based on current BMI.   Current diet order is Heart  Healthy, patient is consuming approximately n/a% of meals at this time. Labs and medications reviewed.   No nutrition interventions warranted at this time. If nutrition issues arise, please consult RD.   Theressa Piedra A. Jimmye Norman, RD, LDN, CDE Pager: [phone] After hours Pager: [phone]

## 2016-03-01 NOTE — Progress Notes (Signed)
Patient had episode of SVT just have  A dose lopressor an hour ago due to HR sustaining in the 120's-160 afib while on cardizem drip. Pt. denies any short of breath or chest pain. Dr. Bertram Denver was notified with orders made, another dose of lopressor was 5mg . IV x1 and magnesium 2g. IV. 12 lead EKG done shows SVT. HR remains in the 150's. Called back Dr. Otelia Sergeant, will see patient.

## 2016-03-02 MED ORDER — MELATONIN 3 MG PO TABS
3.0000 mg | ORAL_TABLET | Freq: Every evening | ORAL | Status: DC | PRN
  Administered 2016-03-03: 3 mg via ORAL
  Filled 2016-03-02 (×2): qty 1

## 2016-03-02 MED ORDER — APIXABAN 2.5 MG PO TABS
2.5000 mg | ORAL_TABLET | Freq: Two times a day (BID) | ORAL | Status: DC
  Administered 2016-03-02 – 2016-03-04 (×5): 2.5 mg via ORAL
  Filled 2016-03-02 (×5): qty 1

## 2016-03-02 NOTE — Progress Notes (Addendum)
Patient Name: Brittney Gutierrez      SUBJECTIVE:without sighnificant sob or palps   Past Medical History  Diagnosis Date  . Paroxysmal a-fib ([place])   . Hypertension   . Formed visual hallucinations     [name]?  Failed Keppra and Depakote  . Macular degeneration   . Diverticulitis 09/28/2007  . TIA (transient ischemic attack)   . Neurocysticercosis     Craniotomy at [facility] in early 2000s  . Depression 09/06/2002  . UTI (urinary tract infection)   . Paranoia ([place])   . Bradycardia, drug induced   . Leg pain, left   . Pure hypercholesterolemia 12/20/1999  . Anxiety 01/17/2003  . Cerebral atherosclerosis 09/24/1999  . Back pain 08/19/2005    with radiculopathy  . Chest pain, atypical   . Diverticulosis of colon 06/07/2003  . External hemorrhoids 04/12/2009  . Malaise and fatigue   . Osteoarthrosis, localized   . Osteoporosis 05/28/2004  . PSVT (paroxysmal supraventricular tachycardia) ([place]) 02/22/2002  . Palpitations 11/16/2001  . Female climacteric state 03/04/2000  . Abnormal weight loss 05/28/2004  . Urinary incontinence 08/18/2007  . Vaginitis, atrophic   . Vertigo, peripheral 10/07/2000  . Hallucination, visual   . Depression with anxiety     Scheduled Meds:  Scheduled Meds: . antiseptic oral rinse  7 mL Mouth Rinse BID  . aspirin  325 mg Oral Daily  . docusate sodium  100 mg Oral BID  . heparin  5,000 Units Subcutaneous 3 times per day  . metoprolol tartrate  12.5 mg Oral Q6H  . risperiDONE  0.5 mg Oral QHS  . sodium chloride flush  3 mL Intravenous Q12H  . venlafaxine  37.5 mg Oral BID   Continuous Infusions: . diltiazem (CARDIZEM) infusion 15 mg/hr (03/02/16 1009)   sodium chloride, acetaminophen, nitroGLYCERIN, ondansetron (ZOFRAN) IV, sodium chloride flush    PHYSICAL EXAM Filed Vitals:   03/02/16 0400 03/02/16 0750 03/02/16 0934 03/02/16 1009  BP:  109/78 105/75 115/78  Pulse:  142 98 147  Temp:  97.6 F (36.4 C)    TempSrc:  Oral     Resp: 19 20 23 21   Height:      Weight:      SpO2:  96% 94% 91%   Well developed and nourished in no acute distress HENT normal Neck supple with JVP-flat Carotids brisk and full without bruits CSM with slowing of antegrade conduction  Clear Irregularly irregular rate and rhythm withrapid ventricular response, no murmurs or gallops Abd-soft with active BS without hepatomegaly No Clubbing cyanosis edema Skin-warm and dry A & Oriented  Grossly normal sensory and motor function   TELEMETRY: Reviewed telemetry pt in afib/flutter ECG  NarrowQRS tach  CSM>> afib/flutter with irreg rate    Intake/Output Summary (Last 24 hours) at 03/02/16 1044 Last data filed at 03/02/16 0700  Gross per 24 hour  Intake    855 ml  Output      0 ml  Net    855 ml    LABS: Basic Metabolic Panel:  Recent Labs Lab 02/29/16 1713 02/29/16 2056 03/01/16 0330  NA 136  --  135  K 4.4  --  4.1  CL 103  --  102  CO2 24  --  23  GLUCOSE 109*  --  124*  BUN 25*  --  22*  CREATININE 1.06* 0.97 0.92  CALCIUM 9.3  --  9.0  MG  --   --  1.8  Cardiac Enzymes:  Recent Labs  02/29/16 1848 03/01/16 0330  TROPONINI 0.10* 0.14*   CBC:  Recent Labs Lab 02/29/16 1713 02/29/16 2056  WBC 7.5 8.5  HGB 11.7* 12.0  HCT 36.5 37.1  MCV 89.9 89.0  PLT 214 222   PROTIME: No results for input(s): LABPROT, INR in the last 72 hours. Liver Function Tests:  Recent Labs  03/01/16 0330  AST 25  ALT 27  ALKPHOS 91  BILITOT 0.7  PROT 6.5  ALBUMIN 3.0*   No results for input(s): LIPASE, AMYLASE in the last 72 hours. BNP: BNP (last 3 results)  Recent Labs  02/29/16 1713  BNP 607.6*    ProBNP (last 3 results) No results for input(s): PROBNP in the last 8760 hours.  D-Dimer: No results for input(s): DDIMER in the last 72 hours. Hemoglobin A1C: No results for input(s): HGBA1C in the last 72 hours. Fasting Lipid Panel: No results for input(s): CHOL, HDL, LDLCALC, TRIG, CHOLHDL, LDLDIRECT  in the last 72 hours. Thyroid Function Tests:  Recent Labs  03/01/16 0330  TSH 4.447       ASSESSMENT AND PLAN:  Principal Problem:   Atrial fibrillation with RVR (HCC) Active Problems:   Dementia   Sinus bradycardia seen on cardiac monitor   Essential hypertension   Hypertensive cardiovascular disease-LVH   Atrial fibrillation with rapid ventricular response (HCC)  Pt with rapid rates, and prob Aflutter atypical with 2:1 conduction and also variable conduction With hx of slow rattes, even to the point of discussing pacing, i am wary as long as she is relatively symptom free to augment rate control which if necessary i would do with amiodarone as this would have an impact post DCCV which i presume is scheduled for Monday  She needs anticoagulation and given age and size will begin [place] and stop hep   It is not clear why she is also on ASA so will stop   will defer orders for TEE to Dr PN who will see pt in am  Signed, Virl Axe MD  03/02/2016

## 2016-03-02 NOTE — Progress Notes (Signed)
Cardiology fellow on call notified about Brittney Gutierrez's HR sustaining 150 to 160's. Cardizem gtt increased to 33ml/hr without changing HR. Order received to give Lopressor 12.5mg  PO q6 hrs. First dose given at midnight. Will continue to monitor closely.

## 2016-03-02 NOTE — Progress Notes (Signed)
Patient OOB to bathroom without assist. IV pulled out, leads off and patient climbed over the side rails. Bed alarm on. Patient very confused about where she was and stated" I had to go to the bathroom." Urine on floor and bed was soaked. Reminded patient again how important it is to call for help. "I forgot." Patient assisted back to bed after her bath and shown the call bell and how it works again. Not sure patient understands or remembers how to call for help. Side rails up and all staff is aware of high fall risk. Will continue to monitor closely.

## 2016-03-03 NOTE — Progress Notes (Signed)
Patient ID: Brittney Gutierrez, female   DOB: 06-Jan-1927, 82 y.o.   MRN: [id number]    Subjective:  Denies SSCP, palpitations or Dyspnea HR better controlled today Discussed TEE/DCC with family and patient   Objective:  Filed Vitals:   03/02/16 2323 03/03/16 0600 03/03/16 0617 03/03/16 0751  BP: 135/72 98/77 153/35 139/63  Pulse: 76  84 77  Temp:    98.8 F (37.1 C)  TempSrc:    Oral  Resp:  20  15  Height:      Weight:  63.186 kg (139 lb 4.8 oz)    SpO2:    91%    Intake/Output from previous day:  Intake/Output Summary (Last 24 hours) at 03/03/16 [id number] Last data filed at 03/03/16 0000  Gross per 24 hour  Intake     50 ml  Output   1100 ml  Net  -1050 ml    Physical Exam: Affect appropriate Elderly white female  HEENT: normal Neck supple with no adenopathy JVP normal no bruits no thyromegaly Lungs clear with no wheezing and good diaphragmatic motion Heart:  S1/S2 SEM  murmur, no rub, gallop or click PMI normal Abdomen: benighn, BS positve, no tenderness, no AAA no bruit.  No HSM or HJR Distal pulses intact with no bruits No edema Neuro non-focal Skin warm and dry No muscular weakness   Lab Results: Basic Metabolic Panel:  Recent Labs  02/29/16 1713 02/29/16 2056 03/01/16 0330  NA 136  --  135  K 4.4  --  4.1  CL 103  --  102  CO2 24  --  23  GLUCOSE 109*  --  124*  BUN 25*  --  22*  CREATININE 1.06* 0.97 0.92  CALCIUM 9.3  --  9.0  MG  --   --  1.8   Liver Function Tests:  Recent Labs  03/01/16 0330  AST 25  ALT 27  ALKPHOS 91  BILITOT 0.7  PROT 6.5  ALBUMIN 3.0*   CBC:  Recent Labs  02/29/16 1713 02/29/16 2056  WBC 7.5 8.5  HGB 11.7* 12.0  HCT 36.5 37.1  MCV 89.9 89.0  PLT 214 222   Cardiac Enzymes:  Recent Labs  02/29/16 1848 03/01/16 0330  TROPONINI 0.10* 0.14*     Recent Labs  03/01/16 0330  TSH 4.447    Imaging: No results found.  Cardiac Studies:  ECG:  Rapid fib/flutter rate 164     Telemetry:   Flutter rate 84  03/03/2016   Echo:  EF 50-55%   Medications:   . antiseptic oral rinse  7 mL Mouth Rinse BID  . apixaban  2.5 mg Oral BID  . docusate sodium  100 mg Oral BID  . metoprolol tartrate  12.5 mg Oral Q6H  . risperiDONE  0.5 mg Oral QHS  . sodium chloride flush  3 mL Intravenous Q12H  . venlafaxine  37.5 mg Oral BID     . diltiazem (CARDIZEM) infusion 10 mg (03/03/16 0000)    Assessment/Plan:  PAF:  See note by Dr Caryl Comes.  Plan TEE/DCC in am  Started on low dose eliquis yesterday.  Continue metoprolol for rate control Prone to post cardioversion bradycardia Family open to PPM if needed EP to see in am  OOB today   Jenkins Rouge 03/03/2016, 8:06 AM

## 2016-03-04 ENCOUNTER — Telehealth: Payer: Self-pay | Admitting: Cardiovascular Disease

## 2016-03-04 DIAGNOSIS — Z7901 Long term (current) use of anticoagulants: Secondary | ICD-10-CM

## 2016-03-04 MED ORDER — METOPROLOL TARTRATE 25 MG PO TABS: 25.0000 mg | ORAL_TABLET | Freq: Two times a day (BID) | ORAL | Status: DC

## 2016-03-04 MED ORDER — AMIODARONE HCL 200 MG PO TABS: 200.0000 mg | ORAL_TABLET | Freq: Two times a day (BID) | ORAL | Status: DC

## 2016-03-04 MED ORDER — APIXABAN 2.5 MG PO TABS: 2.5000 mg | ORAL_TABLET | Freq: Two times a day (BID) | ORAL | Status: DC

## 2016-03-04 MED ORDER — AMIODARONE HCL 200 MG PO TABS: 200.0000 mg | ORAL_TABLET | Freq: Every day | ORAL | Status: DC

## 2016-03-04 MED ORDER — AMIODARONE HCL 200 MG PO TABS
200.0000 mg | ORAL_TABLET | Freq: Two times a day (BID) | ORAL | Status: DC
  Administered 2016-03-04: 200 mg via ORAL
  Filled 2016-03-04: qty 1

## 2016-03-04 MED ORDER — METOPROLOL TARTRATE 25 MG PO TABS: 12.5000 mg | ORAL_TABLET | Freq: Four times a day (QID) | ORAL | Status: DC

## 2016-03-04 NOTE — Telephone Encounter (Signed)
[facility] Hospital,3 to 4 weeks. I think that is what he said.

## 2016-03-04 NOTE — Telephone Encounter (Signed)
Clarification given and rx called to local pharmacy.   Also addressed questions/clarified about starting pacerone BID -> transition in 30 days to daily pacerone.  She is aware to call if further concerns or clarification needed.

## 2016-03-04 NOTE — Discharge Summary (Deleted)
Discharge Summary    Patient ID: Brittney Gutierrez,  MRN: [id number], DOB/AGE: Apr 10, 1927 82 y.o.  Admit date: 02/29/2016 Discharge date: 03/04/2016  Primary Care Provider: Sherrie Mustache K Primary Cardiologist: Dr. Sallyanne Kuster EP: Dr. Curt Bears  Discharge Diagnoses    Principal Problem:   Atrial fibrillation with RVR [facility]) Active Problems:   Dementia   Sinus bradycardia seen on cardiac monitor   Essential hypertension   Hypertensive cardiovascular disease-LVH   Atrial fibrillation with rapid ventricular response (HCC)   SVT   Elevated troponin  Allergies Allergies  Allergen Reactions  . Amoxicillin Other (See Comments)    Unknown- ? Upset stomach  . Demerol [Meperidine]     See things    Diagnostic Studies/Procedures    ECHO 03/01/2016 - Left ventricle: The cavity size was normal. Wall thickness was  increased in a pattern of moderate LVH. Systolic function was  normal. The estimated ejection fraction was in the range of 50%  to 55%. Wall motion was normal; there were no regional wall  motion abnormalities. The study is not technically sufficient to  allow evaluation of LV diastolic function. - Aortic valve: There was mild regurgitation. - Mitral valve: Moderately calcified annulus. Mildly thickened  leaflets . There was mild regurgitation. - Pulmonary arteries: Systolic pressure was mildly increased. PA  peak pressure: 39 mm Hg (S).   History of Present Illness     Brittney Gutierrez is a 82 y.o. female with PMHx of PAFib with reports of SSS and historically recommended PPM but family preferred not to pursue it, she has HTN and questionable hx of TIA is mentioned, she has a CHA2DS2Vasc score of at least 4 (if + TIA would be 7) though her outpatient physicians felt her to be a high risk patient for a/c secondary to her dementia and fall risk.   In September 2016, she was hospitalized with chest discomfort in the setting of atrial fibrillation with rapid  ventricular response and had a very minor increase in cardiac enzymes (troponin 0.2) felt to be secondary to demand ischemia. She converted spontaneously to sinus rhythm. Myoview in Sept 2016 showed no ischemia. Echo shoed preserved LVF with Mild valvular disease. She was admitted to [facility] in Jan 2017 with a similar episode. Rate control has been difficult and she has had significant bradycardia after conversion to sinus rhythm. Her Holter monitor showed a minimum heart rate of 45 bpm and an average heart rate of 60 bpm as well as multiple episodes of paroxysmal atrial fibrillation with rates in the 160s while on treatment with metoprolol and Diltiazem.   She was noted by family/staff at her nursing home to have increase weakness and DOE, noted to be tachycardic and brought to the ER 02/29/16, she wasfound in AF with HR 160's, placed in diltiazem gtt, with improvement in rate of 100-120bpm. On 97ml/hr diltiazem gtt with Toprol XL 50mg  daily. Her home rate controlling meds were Diltiazem CD 240 and Toprol XL 50  Dr. Debara Pickett witnessed rates of 45 to 160 (which appears to be atrial flutter) on 15 mg/hr cardizem as well as metoprolol.  Hospital Course     Consultants: EP  The patient was admitted to step down for close monitoring of bradycardia. The patient was evaluated by Dr. Curt Bears. Given hx of probable bradycardia it was felt that she may need pacemaker but has refused in the past.She again refused and wanted to discuss with primary cardiologist Dr. Sallyanne Kuster.   Has fall history but high CHADS2VASc  of 4, possibly 6 with ?TIA. She was started on low dose Eliquis for anticoagulation through the weekend and TEE/CV next week. However patient converted to NSR spontaneously. Maintained sinus rhythm. Had 1 episode of 7 beats of SVT while sitting. Asymptomatic. She will follow up with EP as post hospital visit and then with Dr. Sallyanne Kuster. Family open to PPM if needed will defer to EP. Elevated troponin felt due to  demand ischemia due to high rate. TSH normal. Echo during admission showed LV function of 50-55%, no wm abnormality, mild AI, PA peak pressure: 39 mm Hg (S). Technically difficult study to evaluate diastolic function.    She will continue PT therapy with legacy at independent living facility ([facility]).  Nurse will ambulate patient prior to discharge.   The patient has been seen by Dr. Marlou Porch today and deemed ready for discharge home. All follow-up appointments have been scheduled. Discharge medications are listed below.   Discharge Vitals Blood pressure 153/60, pulse 76, temperature 98.3 F (36.8 C), temperature source Oral, resp. rate 23, height 5\' 2"  (1.575 m), weight 138 lb 3.2 oz (62.687 kg), SpO2 95 %.  Filed Weights   03/02/16 0320 03/03/16 0600 03/04/16 0420  Weight: 140 lb 3.2 oz (63.594 kg) 139 lb 4.8 oz (63.186 kg) 138 lb 3.2 oz (62.687 kg)    Labs & Radiologic Studies     Dg Chest Port 1 View  02/29/2016  CLINICAL DATA:  Atrial fibrillation EXAM: PORTABLE CHEST 1 VIEW COMPARISON:  12/25/2015 FINDINGS: There is mild bilateral interstitial thickening. There is no pleural effusion or pneumothorax. There is stable cardiomegaly. There is loss of the normal right acromiohumeral distance. There is osteoarthritis of the right glenohumeral joint. IMPRESSION: 1. Cardiomegaly with pulmonary vascular congestion. Electronically Signed   By: Kathreen Devoid   On: 02/29/2016 18:38    Disposition   Pt is being discharged home today in good condition.  Follow-up Plans & Appointments    Follow-up Information    Follow up with Baldwin Jamaica, PA-C. Go on 03/15/2016.   Specialty:  Cardiology   Why:  @8 :30am post hospital    Contact information:   [place] [id number] [phone]      Discharge Instructions    Call MD for:  persistant dizziness or light-headedness    Complete by:  As directed      Diet - low sodium heart healthy    Complete by:  As  directed      Increase activity slowly    Complete by:  As directed            Discharge Medications   Current Discharge Medication List    START taking these medications   Details  !! amiodarone (PACERONE) 200 MG tablet Take 1 tablet (200 mg total) by mouth 2 (two) times daily. Qty: 60 tablet, Refills: 0    !! amiodarone (PACERONE) 200 MG tablet Take 1 tablet (200 mg total) by mouth daily. Qty: 30 tablet, Refills: 3    apixaban (ELIQUIS) 2.5 MG TABS tablet Take 1 tablet (2.5 mg total) by mouth 2 (two) times daily. Qty: 60 tablet, Refills: 6    metoprolol tartrate (LOPRESSOR) 25 MG tablet Take 0.5 tablets (12.5 mg total) by mouth every 6 (six) hours. Qty: 30 tablet, Refills: 3     !! - Potential duplicate medications found. Please discuss with provider.    CONTINUE these medications which have NOT CHANGED   Details  acetaminophen (TYLENOL)  325 MG tablet Take 650 mg by mouth every 6 (six) hours as needed for mild pain.    docusate sodium (COLACE) 100 MG capsule Take 100 mg by mouth 2 (two) times daily.    risperiDONE (RISPERDAL) 0.5 MG tablet Take 0.5 mg by mouth at bedtime.    venlafaxine (EFFEXOR) 37.5 MG tablet Take 37.5 mg by mouth 2 (two) times daily.    donepezil (ARICEPT) 10 MG tablet 1/2 tablet by mouth in the evening for memory loss- after 4 weeks to increase to 1 tablet by mouth daily Qty: 30 tablet, Refills: 2   Associated Diagnoses: Dementia with behavioral disturbance    Melatonin 3 MG CAPS Take 1 capsule (3 mg total) by mouth at bedtime as needed. Refills: 0      STOP taking these medications     aspirin 325 MG tablet      diltiazem (DILTIAZEM CD) 240 MG 24 hr capsule      metoprolol succinate (TOPROL-XL) 50 MG 24 hr tablet          Outstanding Labs/Studies   None  Duration of Discharge Encounter   Greater than 30 minutes including physician time.  Signed, Dezirae Service PA-C 03/04/2016, 1:32 PM

## 2016-03-04 NOTE — Telephone Encounter (Signed)
°  New Prob    Pt has some questions regarding metoprolol tartrate (LOPRESSOR) 25 MG tablet. Please call.

## 2016-03-04 NOTE — Telephone Encounter (Signed)
The two treatments are essentially identical as far as the dose of medication. The advantage of the immediate release is that it will wear off quicker if she develops a slow heart rate. However, I think that taking the medication every 6 hours will be very tough. A good compromise might be metoprolol tartrate 25 mg every 12 hours

## 2016-03-04 NOTE — Progress Notes (Signed)
    SVT noted (looks like short burst of atrial flutter) 19 beats.  Spoke to Dr. Curt Bears. Will start amiodarone 200mg  PO BID for one month then 200mg  PO QD thereafter.  Will have close follow up. No signs of bradycardia here.  Candee Furbish, MD

## 2016-03-04 NOTE — Progress Notes (Signed)
   Cardiologist: Dr Sallyanne Kuster Subjective:  Feels better in NSR. No CP, no SOB  Objective:  Vital Signs in the last 24 hours: Temp:  [98.2 F (36.8 C)-99 F (37.2 C)] 98.4 F (36.9 C) (03/27 0816) Pulse Rate:  [74-90] 78 (03/27 0816) Resp:  [17-22] 22 (03/27 0816) BP: (129-174)/(52-94) 160/67 mmHg (03/27 0816) SpO2:  [86 %-93 %] 93 % (03/27 0816) Weight:  [138 lb 3.2 oz (62.687 kg)] 138 lb 3.2 oz (62.687 kg) (03/27 0420)  Intake/Output from previous day: 03/26 0701 - 03/27 0700 In: -  Out: 1100 [Urine:1100]   Physical Exam: General: Well developed, well nourished, in no acute distress. Head:  Normocephalic and atraumatic. Lungs: Clear to auscultation and percussion. Heart: Normal S1 and S2.  2/6 systolic murmur, no rubs or gallops.  Abdomen: soft, non-tender, positive bowel sounds. Extremities: No clubbing or cyanosis. No edema. Neurologic: Alert and oriented x 3.     Telemetry: NSR Personally viewed.   EKG:  NSR Personally viewed.  Cardiac Studies:  ECHO 03/01/2016 - Left ventricle: The cavity size was normal. Wall thickness was  increased in a pattern of moderate LVH. Systolic function was  normal. The estimated ejection fraction was in the range of 50%  to 55%. Wall motion was normal; there were no regional wall  motion abnormalities. The study is not technically sufficient to  allow evaluation of LV diastolic function. - Aortic valve: There was mild regurgitation. - Mitral valve: Moderately calcified annulus. Mildly thickened  leaflets . There was mild regurgitation. - Pulmonary arteries: Systolic pressure was mildly increased. PA  peak pressure: 39 mm Hg (S).  Meds: Scheduled Meds: . antiseptic oral rinse  7 mL Mouth Rinse BID  . apixaban  2.5 mg Oral BID  . docusate sodium  100 mg Oral BID  . metoprolol tartrate  12.5 mg Oral Q6H  . risperiDONE  0.5 mg Oral QHS  . sodium chloride flush  3 mL Intravenous Q12H  . venlafaxine  37.5 mg Oral BID    Continuous Infusions: . diltiazem (CARDIZEM) infusion Stopped (03/03/16 0700)   PRN Meds:.sodium chloride, acetaminophen, Melatonin, nitroGLYCERIN, ondansetron (ZOFRAN) IV, sodium chloride flush  Assessment/Plan:  Principal Problem:   Atrial fibrillation with RVR (HCC) Active Problems:   Dementia   Sinus bradycardia seen on cardiac monitor   Essential hypertension   Hypertensive cardiovascular disease-LVH   Atrial fibrillation with rapid ventricular response (HCC)  -ok for DC to [facility] -Back in NSR -Will continue low dose metoprolol tartrate 12.5 bid -Continue new start Eliquis, low dose -OFF ASA -Order PT at outpatient  -See EP discussion, at risk in future for pacemaker -Discussed with family  Please have her follow up with Dr. Caryl Comes EP  Also have follow up with Dr Haskel Schroeder, [place] 03/04/2016, 9:39 AM

## 2016-03-04 NOTE — Progress Notes (Signed)
PT Note Evaluation completed with full note to follow.  24 hour care recommended with family preferring I living with hired 24 hour care over SNF placement with therapy.  Continue HHPT,HHOT and HHST as prior to admit.  Pt has all needed equipment.  Thanks. [place] [phone] (pager)

## 2016-03-04 NOTE — Care Management Important Message (Signed)
Important Message  Patient Details  Name: Brenden Gavia MRN: [id number] Date of Birth: 26-Apr-1927   Medicare Important Message Given:  Yes    Loann Quill 03/04/2016, 12:39 PM

## 2016-03-04 NOTE — Progress Notes (Signed)
Physical Therapy Evaluation Patient Details Name: Brittney Gutierrez MRN: [id number] DOB: 06/18/1927 Today's Date: 03/04/2016   History of Present Illness  82 y.o. female who has PAF with SSS, dementia, and HTN. She recently moved to the [place] area from [place] where she was being cared for by Dr. Chancy Milroy. A Pacemaker has been suggested in the past but the pt and family decided against it. She is not felt to be a candidate for anticoagulation secondary to dementia and fall risk.     Clinical Impression  Pt admitted with above diagnosis. Pt currently with functional limitations due to the deficits listed below (see PT Problem List). Pt ambulates well with RW, but her knees were buckling pretty bad with initial stance due to R hip pain, but stability improved with ambulation. Pt will benefit from skilled PT to increase their independence and safety with mobility to allow discharge to the venue listed below. Pt has mild cognitive impairment and her daughters state that she sometimes will forget to call for help, get up and forget to use her AD, and that she also moderately sundowns. This is the main safety concern at this point for pt to return to independent alone as she may try to get up and forget to use her AD and fall due to her knees buckling, which puts her at high risk for falls. Discussed recommendation SNF rehab for pt to get heavier therapy services for a week or so, but pt and family still would rather go back to independent living because she thrives better in her own environment. The family understands that the pt is high fall risk and are willing to pay to set up private 24 hr care so the pt will not be alone. Family states she was receiving HHPT, OT, and ST prior to admission and would like to continue these services. Pt will also benefit from HHPT to improve balance and decrease fall risk and improve independence with mobility.     Follow Up Recommendations Home health  PT;Supervision/Assistance - 24 hour    Equipment Recommendations  None recommended by PT    Recommendations for Other Services OT consult     Precautions / Restrictions Precautions Precautions: Fall Restrictions Weight Bearing Restrictions: No      Mobility  Bed Mobility               General bed mobility comments: Pt sitting on EOB upon entry to room   Transfers Overall transfer level: Needs assistance Equipment used: Rolling walker (2 wheeled) Transfers: Sit to/from Stand Sit to Stand: Min guard         General transfer comment: Pt c/o of right hip pain with standing and her knee was buckling upon initial stand. Lowered walker to appropriate height and her knees quit buckling as she was able to transfer more weight to her hands.   Ambulation/Gait Ambulation/Gait assistance: Supervision Ambulation Distance (Feet): 250 Feet Assistive device: Rolling walker (2 wheeled) Gait Pattern/deviations: Step-through pattern;Decreased stride length;Antalgic;Trunk flexed;Narrow base of support Gait velocity: decreased Gait velocity interpretation: Below normal speed for age/gender General Gait Details: Pt became more stable with ambulation after walking a few feet, she was quite unsteady to begin with due to R hip pain.   Stairs            Wheelchair Mobility    Modified Rankin (Stroke Patients Only)       Balance Overall balance assessment: Needs assistance Sitting-balance support: Feet supported;No upper extremity supported Sitting balance-Leahy Scale: Good  Standing balance support: Bilateral upper extremity supported Standing balance-Leahy Scale: Fair Standing balance comment: Reliant on UE's at this time due to R hip pain.                              Pertinent Vitals/Pain Pain Assessment: Faces Faces Pain Scale: Hurts little more Pain Location: R hip Pain Descriptors / Indicators: Aching;Discomfort;Grimacing;Guarding Pain  Intervention(s): Limited activity within patient's tolerance;Monitored during session;Repositioned  Vitals stable throughout activity on room air.     Home Living Family/patient expects to be discharged to:: Other (Comment) (Independent Living)                 Additional Comments: Apartment has call lights throughout.    Prior Function Level of Independence: Independent with assistive device(s)         Comments: Pt walks with a cane. Dresses and bathes on her own. Has a bench in her shower, walks down to dining room for meals. Has hired help 2x per day to administer medications.      Hand Dominance   Dominant Hand: Right    Extremity/Trunk Assessment   Upper Extremity Assessment: Generalized weakness           Lower Extremity Assessment: Generalized weakness      Cervical / Trunk Assessment: Kyphotic  Communication   Communication: No difficulties  Cognition Arousal/Alertness: Awake/alert Behavior During Therapy: WFL for tasks assessed/performed Overall Cognitive Status: History of cognitive impairments - at baseline       Memory: Decreased short-term memory (daughters state that pt sundowns)              General Comments General comments (skin integrity, edema, etc.): Pt very pleasant and cooperative with therapy.     Exercises        Assessment/Plan    PT Assessment Patient needs continued PT services  PT Diagnosis Abnormality of gait;Generalized weakness;Acute pain   PT Problem List Decreased strength;Decreased balance;Decreased safety awareness;Pain  PT Treatment Interventions Gait training;Functional mobility training;Therapeutic activities;Therapeutic exercise;Balance training;Patient/family education   PT Goals (Current goals can be found in the Care Plan section) Acute Rehab PT Goals Patient Stated Goal: to go home PT Goal Formulation: With patient/family Time For Goal Achievement: 03/18/16 Potential to Achieve Goals: Good     Frequency Min 3X/week   Barriers to discharge        Co-evaluation               End of Session Equipment Utilized During Treatment: Gait belt Activity Tolerance: Patient tolerated treatment well Patient left: in chair;with call bell/phone within reach;with chair alarm set;with family/visitor present Nurse Communication: Mobility status         Time: [id number] PT Time Calculation (min) (ACUTE ONLY): 23 min   Charges:   PT Evaluation $PT Eval Moderate Complexity: 1 Procedure PT Treatments $Gait Training: 8-22 mins   PT G Codes:       [place], SPT Colon Branch 03/04/2016, 12:56 PM

## 2016-03-04 NOTE — Discharge Summary (Signed)
Discharge Summary    Patient ID: Brittney Gutierrez,  MRN: [id number], DOB/AGE: Nov 24, 1927 82 y.o.  Admit date: 02/29/2016 Discharge date: 03/04/2016  Primary Care Provider: Sherrie Mustache Gutierrez Primary Cardiologist: Dr. Sallyanne Gutierrez EP: Dr. Curt Gutierrez  Discharge Diagnoses    Principal Problem:   Atrial fibrillation with RVR [facility]) Active Problems:   Dementia   Sinus bradycardia seen on cardiac monitor   Essential hypertension   Hypertensive cardiovascular disease-LVH   Atrial fibrillation with rapid ventricular response (HCC)   SVT   Elevated troponin  Allergies Allergies  Allergen Reactions  . Amoxicillin Other (See Comments)    Unknown- ? Upset stomach  . Demerol [Meperidine]     See things    Diagnostic Studies/Procedures    ECHO 03/01/2016 - Left ventricle: The cavity size was normal. Wall thickness was  increased in a pattern of moderate LVH. Systolic function was  normal. The estimated ejection fraction was in the range of 50%  to 55%. Wall motion was normal; there were no regional wall  motion abnormalities. The study is not technically sufficient to  allow evaluation of LV diastolic function. - Aortic valve: There was mild regurgitation. - Mitral valve: Moderately calcified annulus. Mildly thickened  leaflets . There was mild regurgitation. - Pulmonary arteries: Systolic pressure was mildly increased. PA  peak pressure: 39 mm Hg (S).   History of Present Illness     Brittney Gutierrez is a 82 y.o. female with PMHx of PAFib with reports of SSS and historically recommended PPM but family preferred not to pursue it, she has HTN and questionable hx of TIA is mentioned, she has a CHA2DS2Vasc score of at least 4 (if + TIA would be 7) though her outpatient physicians felt her to be a high risk patient for a/c secondary to her dementia and fall risk.   In September 2016, she was hospitalized with chest discomfort in the setting of atrial fibrillation with rapid  ventricular response and had a very minor increase in cardiac enzymes (troponin 0.2) felt to be secondary to demand ischemia. She converted spontaneously to sinus rhythm. Myoview in Sept 2016 showed no ischemia. Echo shoed preserved LVF with Mild valvular disease. She was admitted to [facility] in Jan 2017 with a similar episode. Rate control has been difficult and she has had significant bradycardia after conversion to sinus rhythm. Her Holter monitor showed a minimum heart rate of 45 bpm and an average heart rate of 60 bpm as well as multiple episodes of paroxysmal atrial fibrillation with rates in the 160s while on treatment with metoprolol and Diltiazem.   She was noted by family/staff at her nursing home to have increase weakness and DOE, noted to be tachycardic and brought to the ER 02/29/16, she wasfound in AF with HR 160's, placed in diltiazem gtt, with improvement in rate of 100-120bpm. On 34ml/hr diltiazem gtt with Toprol XL 50mg  daily. Her home rate controlling meds were Diltiazem CD 240 and Toprol XL 50  Dr. Debara Gutierrez witnessed rates of 45 to 160 (which appears to be atrial flutter) on 15 mg/hr cardizem as well as metoprolol.  Hospital Course     Consultants: EP  The patient was admitted to step down for close monitoring of bradycardia. The patient was evaluated by Dr. Curt Gutierrez. Given hx of probable bradycardia it was felt that she may need pacemaker but has refused in the past.She again refused and wanted to discuss with primary cardiologist Dr. Sallyanne Gutierrez.   Has fall history but high CHADS2VASc  of 4, possibly 6 with ?TIA. She was started on low dose Eliquis for anticoagulation through the weekend and TEE/CV next week. However patient converted to NSR spontaneously. Maintained sinus rhythm. Had 1 episode of 7 beats of SVT while sitting. Asymptomatic. She will follow up with EP as post hospital visit and then with Dr. Sallyanne Gutierrez. Family open to PPM if needed will defer to EP. Elevated troponin felt due to  demand ischemia due to high rate. TSH normal. Echo during admission showed LV function of 50-55%, no wm abnormality, mild AI, PA peak pressure: 39 mm Hg (S). Technically difficult study to evaluate diastolic function. SVT noted (looks like short burst of atrial flutter) 19 beats.  Spoke to Dr. Curt Gutierrez. Will start amiodarone 200mg  PO BID for one month then 200mg  PO QD thereafter.  She will continue PT therapy with [facility] at independent [facility] facility ([facility]).  Nurse will ambulate patient prior to discharge.   The patient has been seen by Dr. Marlou Gutierrez today and deemed ready for discharge home. All follow-up appointments have been scheduled. Discharge medications are listed below.   Discharge Vitals Blood pressure 153/60, pulse 76, temperature 98.3 F (36.8 C), temperature source Oral, resp. rate 23, height 5\' 2"  (1.575 m), weight 138 lb 3.2 oz (62.687 kg), SpO2 95 %.  Filed Weights   03/02/16 0320 03/03/16 0600 03/04/16 0420  Weight: 140 lb 3.2 oz (63.594 kg) 139 lb 4.8 oz (63.186 kg) 138 lb 3.2 oz (62.687 kg)    Labs & Radiologic Studies     Dg Chest Port 1 View  02/29/2016  CLINICAL DATA:  Atrial fibrillation EXAM: PORTABLE CHEST 1 VIEW COMPARISON:  12/25/2015 FINDINGS: There is mild bilateral interstitial thickening. There is no pleural effusion or pneumothorax. There is stable cardiomegaly. There is loss of the normal right acromiohumeral distance. There is osteoarthritis of the right glenohumeral joint. IMPRESSION: 1. Cardiomegaly with pulmonary vascular congestion. Electronically Signed   By: Brittney Gutierrez   On: 02/29/2016 18:38    Disposition   Pt is being discharged home today in good condition.  Follow-up Plans & Appointments    Follow-up Information    Follow up with Brittney Jamaica, PA-C. Go on 03/15/2016.   Specialty:  Cardiology   Why:  @8 :30am post hospital    Contact information:   [place] [address] [phone]       Schedule an  appointment as soon as possible for a visit with Brittney Klein, MD.   Specialty:  Cardiology   Why:  office will call with appointment   Contact information:   [address] [place] [address] [phone]      Discharge Instructions    Call MD for:  persistant dizziness or light-headedness    Complete by:  As directed      Diet - low sodium heart healthy    Complete by:  As directed      Increase activity slowly    Complete by:  As directed            Discharge Medications   Current Discharge Medication List    START taking these medications   Details  !! amiodarone (PACERONE) 200 MG tablet Take 1 tablet (200 mg total) by mouth 2 (two) times daily. Qty: 60 tablet, Refills: 0    !! amiodarone (PACERONE) 200 MG tablet Take 1 tablet (200 mg total) by mouth daily. Qty: 30 tablet, Refills: 3    apixaban (ELIQUIS) 2.5  MG TABS tablet Take 1 tablet (2.5 mg total) by mouth 2 (two) times daily. Qty: 60 tablet, Refills: 6    metoprolol tartrate (LOPRESSOR) 25 MG tablet Take 0.5 tablets (12.5 mg total) by mouth every 6 (six) hours. Qty: 30 tablet, Refills: 3     !! - Potential duplicate medications found. Please discuss with provider.    CONTINUE these medications which have NOT CHANGED   Details  acetaminophen (TYLENOL) 325 MG tablet Take 650 mg by mouth every 6 (six) hours as needed for mild pain.    docusate sodium (COLACE) 100 MG capsule Take 100 mg by mouth 2 (two) times daily.    risperiDONE (RISPERDAL) 0.5 MG tablet Take 0.5 mg by mouth at bedtime.    venlafaxine (EFFEXOR) 37.5 MG tablet Take 37.5 mg by mouth 2 (two) times daily.    donepezil (ARICEPT) 10 MG tablet 1/2 tablet by mouth in the evening for memory loss- after 4 weeks to increase to 1 tablet by mouth daily Qty: 30 tablet, Refills: 2   Associated Diagnoses: Dementia with behavioral disturbance    Melatonin 3 MG CAPS Take 1 capsule (3 mg total) by mouth at bedtime as needed. Refills: 0       STOP taking these medications     aspirin 325 MG tablet      diltiazem (DILTIAZEM CD) 240 MG 24 hr capsule      metoprolol succinate (TOPROL-XL) 50 MG 24 hr tablet          Outstanding Labs/Studies   None  Duration of Discharge Encounter   Greater than 30 minutes including physician time.  Signed, Bhagat,Bhavinkumar PA-C 03/04/2016, 1:35 PM   Personally seen and examined. Agree with above.  SVT noted (looks like short burst of atrial flutter) 19 beats.  Spoke to Dr. Curt Gutierrez. Will start amiodarone 200mg  PO BID for one month then 200mg  PO QD thereafter.  Will have close follow up. No signs of bradycardia here.  Sundowning noted during her stay.  Auto converted yesterday afternoon.   OK with DC.   Candee Furbish, MD

## 2016-03-04 NOTE — Progress Notes (Addendum)
Spoke w pt and 2 da's. Pt lives indep living at [facility]. fam pay privately for [facility] to come in 2x/day and adm meds.  [facility] provides phy therapy. Have left message for [facility] to call w fax number so we can update order for home phy there. Gave pt 30day free eliquis card and gave fam pt assist form for eliquis.  Pt for dc home today.phy there saw pt and rec snf, sw ref made. fam dec on snf vs hiring help at apt. [facility] to [facility] and [facility] for med adm. Spoke w da. They have decided to take patient back to her apt and hire 24hr assist. They will resume hhpt-ot-sp there w [facility] that she had before. No other needs at this time per pt/fam. Spoke w [facility] and faxed orders for hhpt-hot-hhsp there to [phone].       Per rep at [facility]:   Eliquis:  $42 at retail quoted thru [place], no auth required, tier 3

## 2016-03-04 NOTE — Telephone Encounter (Signed)
Returned call. Patient discharged today & needs clarification for her metoprolol dose. Daughter reports she'd been taking Metoprolol Succ 50mg  1 tablet in AM  New dose is for Metoprolol Tartr written for 12.5mg  q6 hrs. Informed daughter I would seek Dr. Lurline Del advice on dose/frequency.

## 2016-03-05 DIAGNOSIS — R2681 Unsteadiness on feet: Secondary | ICD-10-CM | POA: Diagnosis not present

## 2016-03-05 DIAGNOSIS — M6281 Muscle weakness (generalized): Secondary | ICD-10-CM | POA: Diagnosis not present

## 2016-03-05 DIAGNOSIS — Z9181 History of falling: Secondary | ICD-10-CM | POA: Diagnosis not present

## 2016-03-05 DIAGNOSIS — R41841 Cognitive communication deficit: Secondary | ICD-10-CM | POA: Diagnosis not present

## 2016-03-05 DIAGNOSIS — R278 Other lack of coordination: Secondary | ICD-10-CM | POA: Diagnosis not present

## 2016-03-06 DIAGNOSIS — R2681 Unsteadiness on feet: Secondary | ICD-10-CM | POA: Diagnosis not present

## 2016-03-06 DIAGNOSIS — R278 Other lack of coordination: Secondary | ICD-10-CM | POA: Diagnosis not present

## 2016-03-06 DIAGNOSIS — M6281 Muscle weakness (generalized): Secondary | ICD-10-CM | POA: Diagnosis not present

## 2016-03-06 DIAGNOSIS — R41841 Cognitive communication deficit: Secondary | ICD-10-CM | POA: Diagnosis not present

## 2016-03-06 DIAGNOSIS — Z9181 History of falling: Secondary | ICD-10-CM | POA: Diagnosis not present

## 2016-03-07 DIAGNOSIS — R2681 Unsteadiness on feet: Secondary | ICD-10-CM | POA: Diagnosis not present

## 2016-03-07 DIAGNOSIS — Z9181 History of falling: Secondary | ICD-10-CM | POA: Diagnosis not present

## 2016-03-07 DIAGNOSIS — R41841 Cognitive communication deficit: Secondary | ICD-10-CM | POA: Diagnosis not present

## 2016-03-07 DIAGNOSIS — R278 Other lack of coordination: Secondary | ICD-10-CM | POA: Diagnosis not present

## 2016-03-07 DIAGNOSIS — M6281 Muscle weakness (generalized): Secondary | ICD-10-CM | POA: Diagnosis not present

## 2016-03-08 DIAGNOSIS — R278 Other lack of coordination: Secondary | ICD-10-CM | POA: Diagnosis not present

## 2016-03-08 DIAGNOSIS — R2681 Unsteadiness on feet: Secondary | ICD-10-CM | POA: Diagnosis not present

## 2016-03-08 DIAGNOSIS — R41841 Cognitive communication deficit: Secondary | ICD-10-CM | POA: Diagnosis not present

## 2016-03-08 DIAGNOSIS — Z9181 History of falling: Secondary | ICD-10-CM | POA: Diagnosis not present

## 2016-03-08 DIAGNOSIS — M6281 Muscle weakness (generalized): Secondary | ICD-10-CM | POA: Diagnosis not present

## 2016-03-11 DIAGNOSIS — R278 Other lack of coordination: Secondary | ICD-10-CM | POA: Diagnosis not present

## 2016-03-11 DIAGNOSIS — Z9181 History of falling: Secondary | ICD-10-CM | POA: Diagnosis not present

## 2016-03-11 DIAGNOSIS — M6281 Muscle weakness (generalized): Secondary | ICD-10-CM | POA: Diagnosis not present

## 2016-03-11 DIAGNOSIS — R41841 Cognitive communication deficit: Secondary | ICD-10-CM | POA: Diagnosis not present

## 2016-03-11 DIAGNOSIS — R2681 Unsteadiness on feet: Secondary | ICD-10-CM | POA: Diagnosis not present

## 2016-03-11 NOTE — Telephone Encounter (Signed)
New MEssage  Pt dtr calling to speak w/ RN concerning a rxn to medication ever since her hospital stay- itching/redness on upper body. Please call back and discuss.

## 2016-03-11 NOTE — Telephone Encounter (Signed)
Returning your call. °

## 2016-03-11 NOTE — Telephone Encounter (Signed)
Returned call - call went to VM which is not set up to receive messages.

## 2016-03-12 DIAGNOSIS — Z9181 History of falling: Secondary | ICD-10-CM | POA: Diagnosis not present

## 2016-03-12 DIAGNOSIS — R2681 Unsteadiness on feet: Secondary | ICD-10-CM | POA: Diagnosis not present

## 2016-03-12 DIAGNOSIS — M6281 Muscle weakness (generalized): Secondary | ICD-10-CM | POA: Diagnosis not present

## 2016-03-12 DIAGNOSIS — R278 Other lack of coordination: Secondary | ICD-10-CM | POA: Diagnosis not present

## 2016-03-12 DIAGNOSIS — R41841 Cognitive communication deficit: Secondary | ICD-10-CM | POA: Diagnosis not present

## 2016-03-12 NOTE — Telephone Encounter (Signed)
F/u  Pt dtr returning Rn phone call- can use mobile #- [phone]

## 2016-03-12 NOTE — Telephone Encounter (Signed)
Returned call to patient's daughter [name].She stated mother is itching all over,no rash.Stated itching started day of discharge from hospital 03/04/16.Stated she is taking 2 new medications amiodarone and eliquis.Stated itching getting worse,she was afraid to give her any benadryl.Message sent to Dr.Croitoru for advice.

## 2016-03-12 NOTE — Telephone Encounter (Signed)
I think it's more likely the amiodarone and would stop that medication. Probably best for her to be seen in the office soon.

## 2016-03-13 DIAGNOSIS — R278 Other lack of coordination: Secondary | ICD-10-CM | POA: Diagnosis not present

## 2016-03-13 DIAGNOSIS — M6281 Muscle weakness (generalized): Secondary | ICD-10-CM | POA: Diagnosis not present

## 2016-03-13 DIAGNOSIS — R2681 Unsteadiness on feet: Secondary | ICD-10-CM | POA: Diagnosis not present

## 2016-03-13 DIAGNOSIS — R41841 Cognitive communication deficit: Secondary | ICD-10-CM | POA: Diagnosis not present

## 2016-03-13 DIAGNOSIS — Z9181 History of falling: Secondary | ICD-10-CM | POA: Diagnosis not present

## 2016-03-13 NOTE — Telephone Encounter (Signed)
Returned call to patient's daughter Brittney Gutierrez.Dr.Croitoru advised to stop amiodarone.Advised keep appointment with Tommye Standard PA 03/15/16 at 8:30 am.

## 2016-03-14 DIAGNOSIS — R278 Other lack of coordination: Secondary | ICD-10-CM | POA: Diagnosis not present

## 2016-03-14 DIAGNOSIS — Z9181 History of falling: Secondary | ICD-10-CM | POA: Diagnosis not present

## 2016-03-14 DIAGNOSIS — M6281 Muscle weakness (generalized): Secondary | ICD-10-CM | POA: Diagnosis not present

## 2016-03-14 DIAGNOSIS — R41841 Cognitive communication deficit: Secondary | ICD-10-CM | POA: Diagnosis not present

## 2016-03-14 DIAGNOSIS — R2681 Unsteadiness on feet: Secondary | ICD-10-CM | POA: Diagnosis not present

## 2016-03-14 NOTE — Progress Notes (Signed)
Cardiology Office Note Date:  03/15/2016  Patient ID:  Brittney Gutierrez, DOB 11/04/1927, MRN [id number] PCP:  Lauree Chandler, NP  Cardiologist:  Dr. Sallyanne Kuster Electrophysiologist: Dr. Curt Bears   Chief Complaint: post hospital visit, AFib  History of Present Illness: Brittney Gutierrez is a 82 y.o. female with history of PAFib with reports of SSS and historically recommended PPM but family preferred not to pursue it, she has HTN and questionable hx of TIA is mentioned, she has a CHA2DS2Vasc score of at least 4 (if + TIA would be 7) though her outpatient physicians felt her to be a high risk patient for a/c secondary to her dementia and fall risk.   In September 2016, she was hospitalized with chest discomfort in the setting of atrial fibrillation with rapid ventricular response and had a very minor increase in cardiac enzymes (troponin 0.2) felt to be secondary to demand ischemia. She converted spontaneously to sinus rhythm. Myoview in Sept 2016 showed no ischemia. Echo shoed preserved LVF with Mild valvular disease. She was admitted to [facility] in Jan 2017 with a similar episode. Rate control has been difficult and she has had significant bradycardia after conversion to sinus rhythm. Her Holter monitor showed a minimum heart rate of 45 bpm and an average heart rate of 60 bpm as well as multiple episodes of paroxysmal atrial fibrillation with rates in the 160s while on treatment with metoprolol and Diltiazem.   She was noted by family/staff at her nursing home to have increase weakness and DOE, noted to be tachycardic and brought to the ER, she wasfound in AF with HR 160's, placed in diltiazem gtt, She was started on low dose Eliquis for anticoagulation through the weekend and planned for TEE/CV, however patient converted to NSR spontaneously. Maintained sinus rhythm. TSH normal. Echo during admission showed LV function of 50-55%, no wm abnormality, mild AI, PA peak pressure: 39 mm Hg (S). Technically  difficult study to evaluate diastolic function. SVT noted (looks like short burst of atrial flutter) 19 beats.  Dr. Curt Bears. recommended amiodarone 200mg  PO BID for one month then 200mg  PO QD thereafter in effort to maintain SR and monitor for any brady symptoms.  She comes today accompanied by her daughter, reorts that she is feeling quite well.  She is ambulating with the aid of a walker, feels like she is strong, no falls or stumbles, she denies any kind of CP, palpitations or SOB.  She has not had any lightheadedness, dizzy spells, no near syncope or syncope.  Her BP is high here today and her daughter remarks that at her ALF the therapist has found it as high as 190-200.  The patient denies any bleeding or signs of bleeding with the Eliquis.  She developed significant itching (no rash) suspected to be the amiodarone and was stopped 2-3 days ago with marked improvement off it, and nearly resolved.  The daughter states on a couple occassions her HR was noted to be in the 40's though on both the amiodarone and lopressor at the time.   Past Medical History  Diagnosis Date  . Paroxysmal a-fib ([place])   . Hypertension   . Formed visual hallucinations     [name]?  Failed Keppra and Depakote  . Macular degeneration   . Diverticulitis 09/28/2007  . TIA (transient ischemic attack)   . 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 Depression 09/06/2002  . UTI (urinary tract infection)   . Paranoia ([place])   .  Bradycardia, drug induced   . Leg pain, left   . Pure hypercholesterolemia 12/20/1999  . Anxiety 01/17/2003  . Cerebral atherosclerosis 09/24/1999  . Back pain 08/19/2005    with radiculopathy  . Chest pain, atypical   . Diverticulosis of colon 06/07/2003  . External hemorrhoids 04/12/2009  . Malaise and fatigue   . Osteoarthrosis, localized   . Osteoporosis 05/28/2004  . PSVT (paroxysmal supraventricular tachycardia) ([place]) 02/22/2002  . Palpitations 11/16/2001  . Female  climacteric state 03/04/2000  . Abnormal weight loss 05/28/2004  . Urinary incontinence 08/18/2007  . Vaginitis, atrophic   . Vertigo, peripheral 10/07/2000  . Hallucination, visual   . Depression with anxiety     Past Surgical History  Procedure Laterality Date  . Shoulder surgery Right   . Knee surgery Left 1990  . Brain surgery  2000    Neurocysticercosis  . Vaginal hysterectomy  1960  . Rotator cuff repair  2000  . Cataract extraction  2013    Current Outpatient Prescriptions  Medication Sig Dispense Refill  . acetaminophen (TYLENOL) 325 MG tablet Take 650 mg by mouth every 6 (six) hours as needed for mild pain.    [name] apixaban (ELIQUIS) 2.5 MG TABS tablet Take 1 tablet (2.5 mg total) by mouth 2 (two) times daily. 60 tablet 6  . docusate sodium (COLACE) 100 MG capsule Take 100 mg by mouth 2 (two) times daily.    [name] donepezil (ARICEPT) 10 MG tablet 1/2 tablet by mouth in the evening for memory loss- after 4 weeks to increase to 1 tablet by mouth daily 30 tablet 2  . Melatonin 3 MG CAPS Take 1 capsule (3 mg total) by mouth at bedtime as needed.  0  . metoprolol tartrate (LOPRESSOR) 25 MG tablet Take 1 tablet (25 mg total) by mouth 2 (two) times daily. 180 tablet 3  . risperiDONE (RISPERDAL) 0.5 MG tablet Take 0.5 mg by mouth at bedtime.    [name] venlafaxine (EFFEXOR) 37.5 MG tablet Take 37.5 mg by mouth 2 (two) times daily.    [name] lisinopril (PRINIVIL,ZESTRIL) 2.5 MG tablet Take 1 tablet (2.5 mg total) by mouth daily. 30 tablet 2   No current facility-administered medications for this visit.    Allergies:   Amiodarone; Amoxicillin; and Demerol   Social History:  The patient  reports that she has never smoked. She has never used smokeless tobacco. She reports that she does not drink alcohol or use illicit drugs.   Family History:  The patient's family history includes Heart attack in her father; Hypertension in her sister; Polycystic kidney disease in her mother. There is no history of  Dementia.  ROS:  Please see the history of present illness.   All other systems are reviewed and otherwise negative.   PHYSICAL EXAM:  VS:  BP 174/68 mmHg  Pulse 51  Ht 5\' 2"  (1.575 m)  Wt 133 lb (60.328 kg)  BMI 24.32 kg/m2 BMI: Body mass index is 24.32 kg/(m^2). Well nourished, well developed, elderly WF in no acute distress HEENT: normocephalic, atraumatic Neck: no JVD, carotid bruits or masses Cardiac:  normal S1, S2; RRR; no significant murmurs, no rubs, or gallops Lungs:  clear to auscultation bilaterally, no wheezing, rhonchi or rales Abd: soft, nontender MS: no deformity or atrophy Ext: no edema Skin: warm and dry, no rash Neuro:  No gross deficits appreciated Psych: euthymic mood, full affect  EKG:  Done today and reviewed by myself shows SB, LAD,  51bpm  Recent Labs: 02/29/2016:  B Natriuretic Peptide 607.6*; Hemoglobin 12.0; Platelets 222 03/01/2016: ALT 27; BUN 22*; Creatinine, Ser 0.92; Magnesium 1.8; Potassium 4.1; Sodium 135; TSH 4.447  No results found for requested labs within last 365 days.   Estimated Creatinine Clearance: 37.6 mL/min (by C-G formula based on Cr of 0.92).   Wt Readings from Last 3 Encounters:  03/15/16 133 lb (60.328 kg)  03/04/16 138 lb 3.2 oz (62.687 kg)  02/13/16 132 lb (59.875 kg)     Other studies reviewed: Additional studies/records reviewed today include: summarized above  ASSESSMENT AND PLAN:  1. PAFib, some degree of tachy-brady as described above     CHA2DS2Vasc is at least 4, possibly 7 is + TIA history (patient and family deny this to be a known event)     stable with Eliquis, low dose with weight and age     Pt ambulates with walker and her daughter has commented since being at [facility] and getting PT her gait is much better/stronger.  She has not had any falls.     no symptoms of bradycardia, HR today 51, (off amiodarone 2 maybe 3 days)  2. HTN     BP report from [facility] look generally 160-180     She had previously been  on cardizem and toprol, now only with the lopressor     Will add lisinopril 2.5mg  daily  The patient is feeling quite well, unfortunately she developed itching with the amiodarone, notes reviewed Dr. Caryl Comes felt amio would be the best option for AAD tx with reluctance given her bradycardia.  I will discuss with Dr. Curt Bears,  Further, though for now without symptoms will make no changes.  The patient also has expressed that if she were to need PPM, very much would want Dr. Loletha Grayer involved and preferably to do the implant.  WEe will add amiodarone as an allergy to her chart.  Disposition: F/u with Dr. Loletha Grayer in 3-4 weeks, sooner if needed. Her BP is monitored at [facility]   Current medicines are reviewed at length with the patient today.  The patient did not have any concerns regarding medicines.  Haywood Lasso, PA-C 03/15/2016 10:00 AM     [address] [address] [place] [address] [phone] (office)  [phone] (fax)

## 2016-03-15 ENCOUNTER — Encounter: Payer: Self-pay | Admitting: Physician Assistant

## 2016-03-15 ENCOUNTER — Ambulatory Visit (INDEPENDENT_AMBULATORY_CARE_PROVIDER_SITE_OTHER): Payer: Medicare Other | Admitting: Physician Assistant

## 2016-03-15 VITALS — BP 174/68 | HR 51 | Ht 62.0 in | Wt 133.0 lb

## 2016-03-15 DIAGNOSIS — I4891 Unspecified atrial fibrillation: Secondary | ICD-10-CM | POA: Diagnosis not present

## 2016-03-15 DIAGNOSIS — Z9181 History of falling: Secondary | ICD-10-CM | POA: Diagnosis not present

## 2016-03-15 DIAGNOSIS — I248 Other forms of acute ischemic heart disease: Secondary | ICD-10-CM

## 2016-03-15 DIAGNOSIS — R41841 Cognitive communication deficit: Secondary | ICD-10-CM | POA: Diagnosis not present

## 2016-03-15 DIAGNOSIS — R278 Other lack of coordination: Secondary | ICD-10-CM | POA: Diagnosis not present

## 2016-03-15 DIAGNOSIS — R2681 Unsteadiness on feet: Secondary | ICD-10-CM | POA: Diagnosis not present

## 2016-03-15 DIAGNOSIS — M6281 Muscle weakness (generalized): Secondary | ICD-10-CM | POA: Diagnosis not present

## 2016-03-15 MED ORDER — METOPROLOL TARTRATE 25 MG PO TABS: 25.0000 mg | ORAL_TABLET | Freq: Two times a day (BID) | ORAL | Status: DC

## 2016-03-15 MED ORDER — LISINOPRIL 2.5 MG PO TABS: 2.5000 mg | ORAL_TABLET | Freq: Every day | ORAL | Status: DC

## 2016-03-15 NOTE — Patient Instructions (Addendum)
Medication Instructions:   START TAKING LISINOPRIL 2.5 MG ONCE A DAY    If you need a refill on your cardiac medications before your next appointment, please call your pharmacy.  Labwork: NONE ORDER TODAY    Testing/Procedures:  NONE ORDER TODAY    Follow-Up: WITH DR C IN 3 WEEKS FOR STARTING NEW MEDICINE LINSINOPRIL   Any Other Special Instructions Will Be Listed Below (If Applicable).

## 2016-03-18 DIAGNOSIS — R2681 Unsteadiness on feet: Secondary | ICD-10-CM | POA: Diagnosis not present

## 2016-03-18 DIAGNOSIS — R278 Other lack of coordination: Secondary | ICD-10-CM | POA: Diagnosis not present

## 2016-03-18 DIAGNOSIS — R41841 Cognitive communication deficit: Secondary | ICD-10-CM | POA: Diagnosis not present

## 2016-03-18 DIAGNOSIS — Z9181 History of falling: Secondary | ICD-10-CM | POA: Diagnosis not present

## 2016-03-18 DIAGNOSIS — M6281 Muscle weakness (generalized): Secondary | ICD-10-CM | POA: Diagnosis not present

## 2016-03-19 DIAGNOSIS — R278 Other lack of coordination: Secondary | ICD-10-CM | POA: Diagnosis not present

## 2016-03-19 DIAGNOSIS — Z9181 History of falling: Secondary | ICD-10-CM | POA: Diagnosis not present

## 2016-03-19 DIAGNOSIS — R2681 Unsteadiness on feet: Secondary | ICD-10-CM | POA: Diagnosis not present

## 2016-03-19 DIAGNOSIS — M6281 Muscle weakness (generalized): Secondary | ICD-10-CM | POA: Diagnosis not present

## 2016-03-19 DIAGNOSIS — R41841 Cognitive communication deficit: Secondary | ICD-10-CM | POA: Diagnosis not present

## 2016-03-20 DIAGNOSIS — H353221 Exudative age-related macular degeneration, left eye, with active choroidal neovascularization: Secondary | ICD-10-CM | POA: Diagnosis not present

## 2016-03-20 DIAGNOSIS — H353213 Exudative age-related macular degeneration, right eye, with inactive scar: Secondary | ICD-10-CM | POA: Diagnosis not present

## 2016-03-20 DIAGNOSIS — H43813 Vitreous degeneration, bilateral: Secondary | ICD-10-CM | POA: Diagnosis not present

## 2016-03-21 ENCOUNTER — Telehealth: Payer: Self-pay | Admitting: Cardiovascular Disease

## 2016-03-21 DIAGNOSIS — R278 Other lack of coordination: Secondary | ICD-10-CM | POA: Diagnosis not present

## 2016-03-21 DIAGNOSIS — R2681 Unsteadiness on feet: Secondary | ICD-10-CM | POA: Diagnosis not present

## 2016-03-21 DIAGNOSIS — M6281 Muscle weakness (generalized): Secondary | ICD-10-CM | POA: Diagnosis not present

## 2016-03-21 DIAGNOSIS — Z9181 History of falling: Secondary | ICD-10-CM | POA: Diagnosis not present

## 2016-03-21 DIAGNOSIS — R41841 Cognitive communication deficit: Secondary | ICD-10-CM | POA: Diagnosis not present

## 2016-03-21 NOTE — Telephone Encounter (Signed)
Spoke with daughter and patient seen by Elouise Munroe PA  Patient was given Lisinopril 2.5 mg daily to lower blood pressure Her blood pressures are still running in upper 180's/70's and heart rate 40's-50's Daughter stated over 1/2 of her heart rates are in the 40's. Takes blood pressure and heart rate twice a day Daughter did start her mom back on her Aricept when she was discharged from hospital 03/04/16 Patient is just feeling tired and now fussing about doing therapy which is a change Will forward to Dr Ellyn Hack DOD for review

## 2016-03-21 NOTE — Telephone Encounter (Signed)
BP is still high.  A this point, I would increase Lisinopril to 5 mg daily. HR is a bit low, but not too low.  If < 45 bpm, hold Metoprolol  Darriana Deboy, Leonie [place], MD

## 2016-03-21 NOTE — Telephone Encounter (Signed)
Advised daughter [name] of recommendations, verbalized understanding

## 2016-03-21 NOTE — Telephone Encounter (Signed)
Pt's daughter called in stating that Renee(the PA) started her on Lisinopril and since she has been taking this, her BP is still been elevated and her HR has been low. Please advise the daughter on what to do to help regulate both her BP and HR.   Thanks

## 2016-03-22 DIAGNOSIS — R41841 Cognitive communication deficit: Secondary | ICD-10-CM | POA: Diagnosis not present

## 2016-03-22 DIAGNOSIS — M6281 Muscle weakness (generalized): Secondary | ICD-10-CM | POA: Diagnosis not present

## 2016-03-22 DIAGNOSIS — Z9181 History of falling: Secondary | ICD-10-CM | POA: Diagnosis not present

## 2016-03-22 DIAGNOSIS — R2681 Unsteadiness on feet: Secondary | ICD-10-CM | POA: Diagnosis not present

## 2016-03-22 DIAGNOSIS — R278 Other lack of coordination: Secondary | ICD-10-CM | POA: Diagnosis not present

## 2016-03-25 ENCOUNTER — Telehealth: Payer: Self-pay

## 2016-03-25 ENCOUNTER — Other Ambulatory Visit: Payer: Self-pay | Admitting: *Deleted

## 2016-03-25 MED ORDER — LISINOPRIL 2.5 MG PO TABS: 5.0000 mg | ORAL_TABLET | Freq: Every day | ORAL | Status: DC

## 2016-03-25 NOTE — Telephone Encounter (Signed)
Received call  from [facility] about a form they faxed to office 03/12/16,  I could not find form, they re-faxed it.  Form wants medical history, does patient needed antibiotic prior to any dental procedure or cleanings. Spoke with Dr. Mariea Clonts, cardiologist should answer this form, she couldn't see any reason for any antibiotic. [place] back, suggest she contact Dr. Victorino December office, gave phone number.  Put the form  in shredder.

## 2016-03-25 NOTE — Telephone Encounter (Signed)
Rx(s) sent to pharmacy electronically.  

## 2016-03-26 DIAGNOSIS — Z9181 History of falling: Secondary | ICD-10-CM | POA: Diagnosis not present

## 2016-03-26 DIAGNOSIS — R41841 Cognitive communication deficit: Secondary | ICD-10-CM | POA: Diagnosis not present

## 2016-03-26 DIAGNOSIS — R278 Other lack of coordination: Secondary | ICD-10-CM | POA: Diagnosis not present

## 2016-03-26 DIAGNOSIS — M6281 Muscle weakness (generalized): Secondary | ICD-10-CM | POA: Diagnosis not present

## 2016-03-26 DIAGNOSIS — R2681 Unsteadiness on feet: Secondary | ICD-10-CM | POA: Diagnosis not present

## 2016-03-27 ENCOUNTER — Telehealth: Payer: Self-pay | Admitting: Cardiovascular Disease

## 2016-03-27 DIAGNOSIS — Z9181 History of falling: Secondary | ICD-10-CM | POA: Diagnosis not present

## 2016-03-27 DIAGNOSIS — R278 Other lack of coordination: Secondary | ICD-10-CM | POA: Diagnosis not present

## 2016-03-27 DIAGNOSIS — R41841 Cognitive communication deficit: Secondary | ICD-10-CM | POA: Diagnosis not present

## 2016-03-27 DIAGNOSIS — M6281 Muscle weakness (generalized): Secondary | ICD-10-CM | POA: Diagnosis not present

## 2016-03-27 DIAGNOSIS — R2681 Unsteadiness on feet: Secondary | ICD-10-CM | POA: Diagnosis not present

## 2016-03-27 NOTE — Telephone Encounter (Signed)
Spoke with daughter (Ms Renaldo Fiddler)  She states her mother's blood pressure is still elevated throughout the day  Daughter states patient's blood pressure is being obtained by therapist prior to activity  Since last telephone call 03/21/16 when medication was increase to Linsopril 5 mg daily Blood pressure range 180's-200's /sbp No other symptoms  Daughter wanted to know what else can be done about blood pressure. Patient has an appointment with Dr Sallyanne Kuster 04/11/16 Primary Dr Mariea Clonts 04/05/16   WILL DEFER TO DR Canary Brim

## 2016-03-27 NOTE — Telephone Encounter (Signed)
Please call,pt's blood pressure have been running really high. The last reading was 205/ in the 70's, please call asap to advise.

## 2016-03-29 DIAGNOSIS — R41841 Cognitive communication deficit: Secondary | ICD-10-CM | POA: Diagnosis not present

## 2016-03-29 DIAGNOSIS — R2681 Unsteadiness on feet: Secondary | ICD-10-CM | POA: Diagnosis not present

## 2016-03-29 DIAGNOSIS — Z9181 History of falling: Secondary | ICD-10-CM | POA: Diagnosis not present

## 2016-03-29 DIAGNOSIS — M6281 Muscle weakness (generalized): Secondary | ICD-10-CM | POA: Diagnosis not present

## 2016-03-29 DIAGNOSIS — R278 Other lack of coordination: Secondary | ICD-10-CM | POA: Diagnosis not present

## 2016-03-29 MED ORDER — LISINOPRIL 10 MG PO TABS: 10.0000 mg | ORAL_TABLET | Freq: Every day | ORAL | Status: DC

## 2016-03-29 NOTE — Telephone Encounter (Signed)
Advice given and rx sent to pharmacy.  Requested call in 1 week for update on BPs. Pt has f/u w Dr. Loletha Grayer on 5/4

## 2016-03-29 NOTE — Telephone Encounter (Signed)
Bump lisinopril to 10 mg daily.  She was previously on dilt 240 but d/c at hospital, so BP is bound to be going up some.  Continue with daily BP checks.

## 2016-03-29 NOTE — Telephone Encounter (Signed)
Thank you :)

## 2016-03-29 NOTE — Telephone Encounter (Signed)
Routed for dose recommendation.

## 2016-03-29 NOTE — Telephone Encounter (Signed)
Follow up     Pt c/o BP issue: STAT if pt c/o blurred vision, one-sided weakness or slurred speech  1. What are your last 5 BP readings? 182/7? 2. Are you having any other symptoms (ex. Dizziness, headache, blurred vision, passed out)?  3. What is your BP issue? Daughter states she has not heard back from anyone regarding patients high bp.  The weekend is coming and she needs to talk to someone today.  Pt is taking lisinopril 2.5mg ---2 tab daily and it is not helping.  Please call today

## 2016-04-02 ENCOUNTER — Telehealth: Payer: Self-pay

## 2016-04-02 DIAGNOSIS — R2681 Unsteadiness on feet: Secondary | ICD-10-CM | POA: Diagnosis not present

## 2016-04-02 DIAGNOSIS — R41841 Cognitive communication deficit: Secondary | ICD-10-CM | POA: Diagnosis not present

## 2016-04-02 DIAGNOSIS — R278 Other lack of coordination: Secondary | ICD-10-CM | POA: Diagnosis not present

## 2016-04-02 DIAGNOSIS — Z9181 History of falling: Secondary | ICD-10-CM | POA: Diagnosis not present

## 2016-04-02 DIAGNOSIS — M6281 Muscle weakness (generalized): Secondary | ICD-10-CM | POA: Diagnosis not present

## 2016-04-02 NOTE — Telephone Encounter (Signed)
Faxed requested information to Dr Melvern Sample office - [facility]

## 2016-04-03 DIAGNOSIS — R278 Other lack of coordination: Secondary | ICD-10-CM | POA: Diagnosis not present

## 2016-04-03 DIAGNOSIS — R41841 Cognitive communication deficit: Secondary | ICD-10-CM | POA: Diagnosis not present

## 2016-04-03 DIAGNOSIS — Z9181 History of falling: Secondary | ICD-10-CM | POA: Diagnosis not present

## 2016-04-03 DIAGNOSIS — R2681 Unsteadiness on feet: Secondary | ICD-10-CM | POA: Diagnosis not present

## 2016-04-03 DIAGNOSIS — M6281 Muscle weakness (generalized): Secondary | ICD-10-CM | POA: Diagnosis not present

## 2016-04-04 DIAGNOSIS — R2681 Unsteadiness on feet: Secondary | ICD-10-CM | POA: Diagnosis not present

## 2016-04-04 DIAGNOSIS — R41841 Cognitive communication deficit: Secondary | ICD-10-CM | POA: Diagnosis not present

## 2016-04-04 DIAGNOSIS — Z9181 History of falling: Secondary | ICD-10-CM | POA: Diagnosis not present

## 2016-04-04 DIAGNOSIS — R278 Other lack of coordination: Secondary | ICD-10-CM | POA: Diagnosis not present

## 2016-04-04 DIAGNOSIS — M6281 Muscle weakness (generalized): Secondary | ICD-10-CM | POA: Diagnosis not present

## 2016-04-05 ENCOUNTER — Encounter: Payer: Self-pay | Admitting: Internal Medicine

## 2016-04-05 ENCOUNTER — Ambulatory Visit (INDEPENDENT_AMBULATORY_CARE_PROVIDER_SITE_OTHER): Payer: Medicare Other | Admitting: Internal Medicine

## 2016-04-05 VITALS — BP 160/70 | HR 54 | Temp 97.9°F | Ht 62.0 in | Wt 132.0 lb

## 2016-04-05 DIAGNOSIS — M21612 Bunion of left foot: Secondary | ICD-10-CM

## 2016-04-05 DIAGNOSIS — J300 Vasomotor rhinitis: Secondary | ICD-10-CM

## 2016-04-05 DIAGNOSIS — I248 Other forms of acute ischemic heart disease: Secondary | ICD-10-CM | POA: Diagnosis not present

## 2016-04-05 DIAGNOSIS — F329 Major depressive disorder, single episode, unspecified: Secondary | ICD-10-CM

## 2016-04-05 DIAGNOSIS — K5901 Slow transit constipation: Secondary | ICD-10-CM

## 2016-04-05 DIAGNOSIS — F0391 Unspecified dementia with behavioral disturbance: Secondary | ICD-10-CM | POA: Diagnosis not present

## 2016-04-05 DIAGNOSIS — I480 Paroxysmal atrial fibrillation: Secondary | ICD-10-CM

## 2016-04-05 DIAGNOSIS — H5316 Psychophysical visual disturbances: Secondary | ICD-10-CM

## 2016-04-05 DIAGNOSIS — R441 Visual hallucinations: Secondary | ICD-10-CM

## 2016-04-05 DIAGNOSIS — F32A Depression, unspecified: Secondary | ICD-10-CM

## 2016-04-05 DIAGNOSIS — G4700 Insomnia, unspecified: Secondary | ICD-10-CM

## 2016-04-05 DIAGNOSIS — F418 Other specified anxiety disorders: Secondary | ICD-10-CM | POA: Diagnosis not present

## 2016-04-05 DIAGNOSIS — F03918 Unspecified dementia, unspecified severity, with other behavioral disturbance: Secondary | ICD-10-CM

## 2016-04-05 DIAGNOSIS — F419 Anxiety disorder, unspecified: Secondary | ICD-10-CM

## 2016-04-05 MED ORDER — RISPERIDONE 0.25 MG PO TABS: ORAL_TABLET | ORAL | Status: DC

## 2016-04-05 NOTE — Progress Notes (Signed)
Patient ID: Brittney Gutierrez, female   DOB: 02/24/1927, 82 y.o.   MRN: [id number]   Location:  [facility] clinic Provider:  Chee Dimon L. Mariea Clonts, D.O., C.M.D.  Code Status: full code Goals of Care:  Advanced Directives 04/05/2016  Does patient have an advance directive? Yes  Type of Advance Directive -  Does patient want to make changes to advanced directive? -  Copy of advanced directive(s) in chart? No - copy requested     Chief Complaint  Patient presents with  . Medical Management of Chronic Issues    6 week follow-up on Aricept    HPI: Patient is a 82 y.o. female pt with h/o macular degeneration, dementia with behaviors, afib, htn, tia, anxiety and insomnia  Brittney Gutierrez's pt seen today for medical management of chronic diseases.    She is getting speech therapy with [facility] (lives in [place]).  She feels it is quite helpful.  She did start off having some dreams with aricept.  Took her off for a while, then, resumed it in the morning now for a couple of weeks.  Sleeping better.    She reports her mood is good.  Her daughter, Brittney Gutierrez, says she is doing better with mood on the phone.  Still has word-finding difficulty.  Also has trouble writing in general.  Had been hallucinating.  Has not commented about seeing people lately, but bluish imaging persist.  Is using effexor and low dose of the risperdal for the hallucinations (was started by neurology in [place]).    Macular degeneration:  Went to ophtho just last week.  Left is very bad.  Right went into wet phase and she's been getting injections--has made great progress and the injections are getting spread out more.  Brittney Gutierrez.  C/o runny nose for past month.  Doesn't matter if inside or outside.  Says it's embarrassing that she has to take a tissue with her.    Is no longer on xanax and using melatonin.    Afib on eliquis and lopressor.  BP med just was increased last week and sees Dr. Loletha Grayer next week.  BP high today.    Left great  toe bunion bothersome even after it was trimmed down. Wants to see podiatry now. Past Medical History  Diagnosis Date  . Paroxysmal a-fib ([place])   . Hypertension   . Formed visual hallucinations     Brittney Gutierrez?  Failed Keppra and Depakote  . Macular degeneration   . Diverticulitis 09/28/2007  . TIA (transient ischemic attack)   . Neurocysticercosis     Craniotomy at [facility] in early 2000s  . Depression 09/06/2002  . UTI (urinary tract infection)   . Paranoia ([place])   . Bradycardia, drug induced   . Leg pain, left   . Pure hypercholesterolemia 12/20/1999  . Anxiety 01/17/2003  . Cerebral atherosclerosis 09/24/1999  . Back pain 08/19/2005    with radiculopathy  . Chest pain, atypical   . Diverticulosis of colon 06/07/2003  . External hemorrhoids 04/12/2009  . Malaise and fatigue   . Osteoarthrosis, localized   . Osteoporosis 05/28/2004  . PSVT (paroxysmal supraventricular tachycardia) ([place]) 02/22/2002  . Palpitations 11/16/2001  . Female climacteric state 03/04/2000  . Abnormal weight loss 05/28/2004  . Urinary incontinence 08/18/2007  . Vaginitis, atrophic   . Vertigo, peripheral 10/07/2000  . Hallucination, visual   . Depression with anxiety     Past Surgical History  Procedure Laterality Date  . Shoulder surgery Right   .  Knee surgery Left 1990  . Brain surgery  2000    Neurocysticercosis  . Vaginal hysterectomy  1960  . Rotator cuff repair  2000  . Cataract extraction  2013    Allergies  Allergen Reactions  . Amiodarone Itching  . Amoxicillin Other (See Comments)    Unknown- ? Upset stomach  . Demerol [Meperidine]     See things      Medication List       This list is accurate as of: 04/05/16 10:07 AM.  Always use your most recent med list.               acetaminophen 325 MG tablet  Commonly known as:  TYLENOL  Take 650 mg by mouth every 6 (six) hours as needed for mild pain.     apixaban 2.5 MG Tabs tablet  Commonly known as:  ELIQUIS  Take 1 tablet  (2.5 mg total) by mouth 2 (two) times daily.     docusate sodium 100 MG capsule  Commonly known as:  COLACE  Take 100 mg by mouth 2 (two) times daily.     donepezil 10 MG tablet  Commonly known as:  ARICEPT  1/2 tablet by mouth in the evening for memory loss- after 4 weeks to increase to 1 tablet by mouth daily     lisinopril 10 MG tablet  Commonly known as:  PRINIVIL,ZESTRIL  Take 1 tablet (10 mg total) by mouth daily.     Melatonin 3 MG Caps  Take 1 capsule (3 mg total) by mouth at bedtime as needed.     metoprolol tartrate 25 MG tablet  Commonly known as:  LOPRESSOR  Take 25 mg by mouth 2 (two) times daily. Hold if pulse goes below 45     risperiDONE 0.5 MG tablet  Commonly known as:  RISPERDAL  Take 0.5 mg by mouth at bedtime.     venlafaxine 37.5 MG tablet  Commonly known as:  EFFEXOR  Take 37.5 mg by mouth 2 (two) times daily.        Review of Systems:  Review of Systems  Constitutional: Negative for fever, chills and weight loss.  HENT:       Rhinitis  Eyes: Positive for blurred vision.       Macular degeneration (see hpi)  Respiratory: Negative for cough and shortness of breath.   Cardiovascular: Negative for chest pain, palpitations and leg swelling.       In afib now, bp still high and cardiology is adjusting meds  Gastrointestinal: Negative for abdominal pain.  Genitourinary: Negative for dysuria.  Musculoskeletal: Negative for falls.  Neurological: Negative for dizziness and loss of consciousness.  Psychiatric/Behavioral: Positive for memory loss. Negative for depression and hallucinations. The patient has insomnia. The patient is not nervous/anxious.        Still sees bluish figures, but not seeing people like she did    Health Maintenance  Topic Date Due  . ZOSTAVAX  11/10/1989  . DEXA SCAN  11/10/1994  . INFLUENZA VACCINE  07/09/2016  . TETANUS/TDAP  05/14/2022  . PNA vac Low Risk Adult  Completed    Physical Exam: Filed Vitals:   04/05/16  0936  BP: 160/70  Pulse: 54  Temp: 97.9 F (36.6 C)  TempSrc: Oral  Height: 5\' 2"  (1.575 m)  Weight: 132 lb (59.875 kg)  SpO2: 94%   Body mass index is 24.14 kg/(m^2). Physical Exam  Constitutional: She appears well-developed and well-nourished. No distress.  Eyes:  glasses  Cardiovascular: Intact distal pulses.   irreg irreg  Pulmonary/Chest: Effort normal and breath sounds normal. No respiratory distress.  Abdominal: Soft. Bowel sounds are normal.  Musculoskeletal: Normal range of motion.  Neurological: She is alert. No cranial nerve deficit. She exhibits normal muscle tone. Coordination normal.  Oriented to person, place, not time  Skin: Skin is warm and dry.  Psychiatric: She has a normal mood and affect.    Labs reviewed: Basic Metabolic Panel:  Recent Labs  12/26/15 1536  01/16/16 1058 02/29/16 1713 02/29/16 2056 03/01/16 0330  NA 139  < > 139 136  --  135  K 3.0*  < > 4.3 4.4  --  4.1  CL 98*  < > 98 103  --  102  CO2 28  < > 22 24  --  23  GLUCOSE 100*  < > 80 109*  --  124*  BUN 12  < > 18 25*  --  22*  CREATININE 0.89  < > 0.73 1.06* 0.97 0.92  CALCIUM 9.5  < > 9.3 9.3  --  9.0  MG 1.8  --   --   --   --  1.8  TSH  --   --  6.250*  --   --  4.447  < > = values in this interval not displayed. Liver Function Tests:  Recent Labs  12/26/15 1536 01/16/16 1058 03/01/16 0330  AST 26 14 25   ALT 17 15 27   ALKPHOS 101 114 91  BILITOT 0.6 <0.2 0.7  PROT 8.2* 7.1 6.5  ALBUMIN 3.8 3.9 3.0*   No results for input(s): LIPASE, AMYLASE in the last 8760 hours. No results for input(s): AMMONIA in the last 8760 hours. CBC:  Recent Labs  12/28/15 0314 01/16/16 1058 02/29/16 1713 02/29/16 2056  WBC 5.0 4.9 7.5 8.5  NEUTROABS  --  3.1  --   --   HGB 10.7*  --  11.7* 12.0  HCT 33.4* 35.9 36.5 37.1  MCV 88.1 87 89.9 89.0  PLT 243 266 214 222    Assessment/Plan 1. 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 Dementia with behavioral disturbance - cont aricept in daytime, socialization, mental activities, ST and other stimulation -maintain normal sleep wake cycles - risperiDONE (RISPERDAL) 0.25 MG tablet; One po qhs x 2 wks, then one half tablet po qhs for 2 wks, then stop  Dispense: 21 tablet; Refill: 3 - donepezil (ARICEPT) 10 MG tablet; Take 1 tablet (10 mg total) by mouth daily.  Dispense: 30 tablet; Refill: 2  3. Hallucinations, visual - will try to reduce risperdal -I suspect her bluish visions are due to #1 rather than her dementia, but seeing people certainly defines hallucinations so if symptoms return with lower dose or when it's stopped, will resume the risperdal at the lowest dose where she did not have symptoms - risperiDONE (RISPERDAL) 0.25 MG tablet; One po qhs x 2 wks, then one half tablet po qhs for 2 wks, then stop  Dispense: 21 tablet; Refill: 3  4. Paroxysmal atrial fibrillation (HCC) -cont on eliquis low dose bid per cardiology, rate controlled with lopressor, but bp still elevated--to f/u with them on this  5. Slow transit constipation -doing ok with use of colace, water, exercise  6. Anxiety and depression -continues on effexor therapy for this, off xanax now successfully  7. Insomnia -cont melatonin use and taking the aricept in the am which seems to be working better w/o nightmares  8. Vasomotor rhinitis -explained that this is common in geriatric patients and the side effects of the meds that dry it up outweigh the benefits and told her most of her friends are probably carrying a tissue in the dining area also  9. Bunion, left foot -counseled on need for further mgt by podiatry--given names of podiatrists/offices  Labs/tests ordered:  No orders of the defined types were placed in this  encounter.    Next appt:  07/08/2016 with Janett Billow to f/u on dementia, hallucinations   Lakyn Mantione L. Delorus Langwell, D.O. [facility] [address], [place] [address] Cell Phone (Mon-Fri 8am-5pm):  [phone] On Call:  [phone] & follow prompts after 5pm & weekends Office Phone:  [phone] Office Fax:  [phone]

## 2016-04-06 ENCOUNTER — Telehealth: Payer: Self-pay | Admitting: Physician Assistant

## 2016-04-06 MED ORDER — DONEPEZIL HCL 10 MG PO TABS: 10.0000 mg | ORAL_TABLET | Freq: Every day | ORAL | Status: DC

## 2016-04-06 NOTE — Telephone Encounter (Signed)
Paged by the patient's daughter regarding dizziness, lightheadedness, and cold sweats after taking increased dose of lisinopril. Her right simple was recently increased for systolic blood pressure in the 190s.   I am not sure why she is so symptomatic on current medical management. Her heart rate is in the upper 50s to 60s per daughter. It is not very low in this case. It is possible drop in the blood sugar can cause similar symptoms, I have advised the family to give her some juice. If her symptom stabilize, she can follow-up with Dr. Sallyanne Kuster, however if her symptoms get worse, she will need to seek medical attention sooner.  Hilbert Corrigan PA Pager: [phone]

## 2016-04-08 ENCOUNTER — Ambulatory Visit (INDEPENDENT_AMBULATORY_CARE_PROVIDER_SITE_OTHER): Payer: Medicare Other | Admitting: Internal Medicine

## 2016-04-08 ENCOUNTER — Encounter: Payer: Self-pay | Admitting: Internal Medicine

## 2016-04-08 VITALS — BP 160/80 | HR 56 | Temp 97.6°F | Wt 131.0 lb

## 2016-04-08 DIAGNOSIS — R001 Bradycardia, unspecified: Secondary | ICD-10-CM

## 2016-04-08 DIAGNOSIS — R42 Dizziness and giddiness: Secondary | ICD-10-CM | POA: Diagnosis not present

## 2016-04-08 DIAGNOSIS — Z9181 History of falling: Secondary | ICD-10-CM | POA: Diagnosis not present

## 2016-04-08 DIAGNOSIS — R300 Dysuria: Secondary | ICD-10-CM

## 2016-04-08 DIAGNOSIS — R278 Other lack of coordination: Secondary | ICD-10-CM | POA: Diagnosis not present

## 2016-04-08 DIAGNOSIS — R41841 Cognitive communication deficit: Secondary | ICD-10-CM | POA: Diagnosis not present

## 2016-04-08 DIAGNOSIS — R2681 Unsteadiness on feet: Secondary | ICD-10-CM | POA: Diagnosis not present

## 2016-04-08 DIAGNOSIS — I248 Other forms of acute ischemic heart disease: Secondary | ICD-10-CM

## 2016-04-08 DIAGNOSIS — R61 Generalized hyperhidrosis: Secondary | ICD-10-CM | POA: Diagnosis not present

## 2016-04-08 DIAGNOSIS — M6281 Muscle weakness (generalized): Secondary | ICD-10-CM | POA: Diagnosis not present

## 2016-04-08 DIAGNOSIS — R0789 Other chest pain: Secondary | ICD-10-CM | POA: Insufficient documentation

## 2016-04-08 LAB — POCT URINALYSIS DIPSTICK
Bilirubin, UA: NEGATIVE
Glucose, UA: NEGATIVE
Ketones, UA: NEGATIVE
Nitrite, UA: NEGATIVE
Protein, UA: NEGATIVE
Spec Grav, UA: 1.01
Urobilinogen, UA: NEGATIVE
pH, UA: 6.5

## 2016-04-08 NOTE — Progress Notes (Signed)
Patient ID: Brittney Gutierrez, female   DOB: December 22, 1926, 82 y.o.   MRN: [id number]   Location:  [facility] clinic Provider: Finnegan Gatta L. Mariea Gutierrez, D.O., C.M.D.  Goals of Care:  Advanced Directives 04/05/2016  Does patient have an advance directive? Yes  Type of Advance Directive -  Does patient want to make changes to advanced directive? -  Copy of advanced directive(s) in chart? No - copy requested   Chief Complaint  Patient presents with  . Acute Visit    UTI    HPI: Patient is a 82 y.o. female seen today for an acute visit for possible UTI.  She has been clammy.  She has dysuria.  Was out with [name] and they went to get her prescription at the pharmacy and going to go grocery shopping.  Was tingling all over as she sat waiting for her daughter.  She was clammy.  Went to the car and her daughter went and got her items she needed.  Was c/o dysuria on Saturday.  Has chronic frequency--does drink a lot of water, but didn't drink much Sunday.  No suprapubic or flank pain.  Some chronic back pain.    Is seeing cardiology Wed b/c she was started on lisinopril and [name] was concerned it was causing the clamminess and dizziness.  Does say she had a little pain in her chest last night, but it went away.  Did not feel like her afib spells.  Then says she is clammy every night for a while.  Sweats on forehead.  Bed will be wet with sweat.    Past Medical History  Diagnosis Date  . Paroxysmal a-fib ([place])   . Hypertension   . Formed visual hallucinations     [name]?  Failed Keppra and Depakote  . Macular degeneration   . Diverticulitis 09/28/2007  . TIA (transient ischemic attack)   . Neurocysticercosis     Craniotomy at [facility] in early 2000s  . Depression 09/06/2002  . UTI (urinary tract infection)   . Paranoia ([place])   . Bradycardia, drug induced   . Leg pain, left   . Pure hypercholesterolemia 12/20/1999  . Anxiety 01/17/2003  . Cerebral atherosclerosis 09/24/1999  . Back pain 08/19/2005    with  radiculopathy  . Chest pain, atypical   . Diverticulosis of colon 06/07/2003  . External hemorrhoids 04/12/2009  . Malaise and fatigue   . Osteoarthrosis, localized   . Osteoporosis 05/28/2004  . PSVT (paroxysmal supraventricular tachycardia) ([place]) 02/22/2002  . Palpitations 11/16/2001  . Female climacteric state 03/04/2000  . Abnormal weight loss 05/28/2004  . Urinary incontinence 08/18/2007  . Vaginitis, atrophic   . Vertigo, peripheral 10/07/2000  . Hallucination, visual   . Depression with anxiety     Past Surgical History  Procedure Laterality Date  . Shoulder surgery Right   . Knee surgery Left 1990  . Brain surgery  2000    Neurocysticercosis  . Vaginal hysterectomy  1960  . Rotator cuff repair  2000  . Cataract extraction  2013    Allergies  Allergen Reactions  . Amiodarone Itching  . Amoxicillin Other (See Comments)    Unknown- ? Upset stomach  . Demerol [Meperidine]     See things      Medication List       This list is accurate as of: 04/08/16 11:52 AM.  Always use your most recent med list.               acetaminophen 325 MG  tablet  Commonly known as:  TYLENOL  Take 650 mg by mouth every 6 (six) hours as needed for mild pain.     apixaban 2.5 MG Tabs tablet  Commonly known as:  ELIQUIS  Take 1 tablet (2.5 mg total) by mouth 2 (two) times daily.     docusate sodium 100 MG capsule  Commonly known as:  COLACE  Take 100 mg by mouth 2 (two) times daily.     donepezil 10 MG tablet  Commonly known as:  ARICEPT  Take 1 tablet (10 mg total) by mouth daily.     lisinopril 10 MG tablet  Commonly known as:  PRINIVIL,ZESTRIL  Take 1 tablet (10 mg total) by mouth daily.     Melatonin 3 MG Caps  Take 1 capsule (3 mg total) by mouth at bedtime as needed.     metoprolol tartrate 25 MG tablet  Commonly known as:  LOPRESSOR  Take 25 mg by mouth 2 (two) times daily. Hold if pulse goes below 45     risperiDONE 0.25 MG tablet  Commonly known as:  RISPERDAL  One  po qhs x 2 wks, then one half tablet po qhs for 2 wks, then stop     venlafaxine 37.5 MG tablet  Commonly known as:  EFFEXOR  Take 37.5 mg by mouth 2 (two) times daily.        Review of Systems:  Review of Systems  Constitutional: Positive for diaphoresis. Negative for fever, chills and malaise/fatigue.  Respiratory: Negative for cough and shortness of breath.   Cardiovascular: Negative for chest pain, palpitations and leg swelling.  Gastrointestinal: Negative for abdominal pain.  Genitourinary: Positive for dysuria, urgency and frequency. Negative for hematuria and flank pain.       No suprapubic pain, no flank pain  Musculoskeletal: Negative for falls.  Neurological: Positive for dizziness. Negative for loss of consciousness and weakness.  Psychiatric/Behavioral: Positive for memory loss. Negative for hallucinations.       Chronically sees bluish figures with macular    Health Maintenance  Topic Date Due  . ZOSTAVAX  11/10/1989  . DEXA SCAN  11/10/1994  . INFLUENZA VACCINE  07/09/2016  . TETANUS/TDAP  05/14/2022  . PNA vac Low Risk Adult  Completed    Physical Exam: Filed Vitals:   04/08/16 1129  BP: 160/80  Pulse: 56  Temp: 97.6 F (36.4 C)  TempSrc: Oral  Weight: 131 lb (59.421 kg)  SpO2: 94%   Body mass index is 23.95 kg/(m^2). Physical Exam  Constitutional: She appears well-developed and well-nourished. No distress.  Cardiovascular: Regular rhythm, normal heart sounds and intact distal pulses.   bradycardic  Pulmonary/Chest: Effort normal and breath sounds normal. No respiratory distress. She has no rales.  Abdominal: Soft. Bowel sounds are normal. She exhibits no distension and no mass. There is no tenderness. There is no rebound and no guarding.  Genitourinary:  No suprapubic tenderness or CVA tenderness  Musculoskeletal: Normal range of motion.  Neurological: She is alert.  Skin: Skin is warm and dry. There is pallor.  Psychiatric: She has a normal mood  and affect.    Labs reviewed: Basic Metabolic Panel:  Recent Labs  12/26/15 1536  01/16/16 1058 02/29/16 1713 02/29/16 2056 03/01/16 0330  NA 139  < > 139 136  --  135  K 3.0*  < > 4.3 4.4  --  4.1  CL 98*  < > 98 103  --  102  CO2 28  < >  22 24  --  23  GLUCOSE 100*  < > 80 109*  --  124*  BUN 12  < > 18 25*  --  22*  CREATININE 0.89  < > 0.73 1.06* 0.97 0.92  CALCIUM 9.5  < > 9.3 9.3  --  9.0  MG 1.8  --   --   --   --  1.8  TSH  --   --  6.250*  --   --  4.447  < > = values in this interval not displayed. Liver Function Tests:  Recent Labs  12/26/15 1536 01/16/16 1058 03/01/16 0330  AST 26 14 25   ALT 17 15 27   ALKPHOS 101 114 91  BILITOT 0.6 <0.2 0.7  PROT 8.2* 7.1 6.5  ALBUMIN 3.8 3.9 3.0*   No results for input(s): LIPASE, AMYLASE in the last 8760 hours. No results for input(s): AMMONIA in the last 8760 hours. CBC:  Recent Labs  12/28/15 0314 01/16/16 1058 02/29/16 1713 02/29/16 2056  WBC 5.0 4.9 7.5 8.5  NEUTROABS  --  3.1  --   --   HGB 10.7*  --  11.7* 12.0  HCT 33.4* 35.9 36.5 37.1  MCV 88.1 87 89.9 89.0  PLT 243 266 214 222   Assessment/Plan 1. Burning with urination - dipstick questionable with negative nitrate -may simply be from poor fluid intake past few days due to weak and lightheaded feeling - POC Urinalysis Dipstick - Urine culture sent--if positive, will treat with abx when sensitivities return -push fluids esp water  2. Episodic lightheadedness -suspect related to hypertension and bradycardia--seeing cardiology Wed so I decided not to [facility] with their plans today -counseled on getting up slowly to avoid falls--says she can only get up slowly anyway  3. Bradycardia with 51 - 60 beats per minute -due to new medications for afib with RVR -her family suspects her symptoms are related to these new meds also  4. Diaphoresis -has been going on for "a while"  -suspect effexor as potential cause as SNRIs do have this in their package  insert -if she is adjusting to her new home and hallucinations stay gone, might taper her off this which would also help with her bp control -effexor cannot be suddenly stopped  Labs/tests ordered:   Orders Placed This Encounter  Procedures  . Urine culture  . POC Urinalysis Dipstick    Next appt:  07/08/2016  Travus Oren L. Brookelin Felber, D.O. [facility] [address], [place] [address] Cell Phone (Mon-Fri 8am-5pm):  [phone] On Call:  [phone] & follow prompts after 5pm & weekends Office Phone:  [phone] Office Fax:  [phone]

## 2016-04-09 DIAGNOSIS — R2681 Unsteadiness on feet: Secondary | ICD-10-CM | POA: Diagnosis not present

## 2016-04-09 DIAGNOSIS — R41841 Cognitive communication deficit: Secondary | ICD-10-CM | POA: Diagnosis not present

## 2016-04-09 DIAGNOSIS — Z9181 History of falling: Secondary | ICD-10-CM | POA: Diagnosis not present

## 2016-04-09 DIAGNOSIS — M6281 Muscle weakness (generalized): Secondary | ICD-10-CM | POA: Diagnosis not present

## 2016-04-09 DIAGNOSIS — R278 Other lack of coordination: Secondary | ICD-10-CM | POA: Diagnosis not present

## 2016-04-10 ENCOUNTER — Other Ambulatory Visit: Payer: Medicare Other

## 2016-04-10 ENCOUNTER — Encounter: Payer: Self-pay | Admitting: Cardiovascular Disease

## 2016-04-10 ENCOUNTER — Ambulatory Visit (INDEPENDENT_AMBULATORY_CARE_PROVIDER_SITE_OTHER): Payer: Medicare Other | Admitting: Cardiovascular Disease

## 2016-04-10 ENCOUNTER — Telehealth: Payer: Self-pay

## 2016-04-10 VITALS — BP 178/74 | HR 54 | Ht 62.0 in | Wt 131.2 lb

## 2016-04-10 DIAGNOSIS — I4891 Unspecified atrial fibrillation: Secondary | ICD-10-CM | POA: Diagnosis not present

## 2016-04-10 DIAGNOSIS — I495 Sick sinus syndrome: Secondary | ICD-10-CM

## 2016-04-10 DIAGNOSIS — R300 Dysuria: Secondary | ICD-10-CM | POA: Diagnosis not present

## 2016-04-10 DIAGNOSIS — I10 Essential (primary) hypertension: Secondary | ICD-10-CM

## 2016-04-10 DIAGNOSIS — I248 Other forms of acute ischemic heart disease: Secondary | ICD-10-CM | POA: Diagnosis not present

## 2016-04-10 MED ORDER — HYDROCHLOROTHIAZIDE 12.5 MG PO CAPS: 12.5000 mg | ORAL_CAPSULE | Freq: Every day | ORAL | Status: DC

## 2016-04-10 MED ORDER — LISINOPRIL 20 MG PO TABS: 20.0000 mg | ORAL_TABLET | Freq: Every day | ORAL | Status: DC

## 2016-04-10 MED ORDER — CIPROFLOXACIN HCL 500 MG PO TABS: 500.0000 mg | ORAL_TABLET | Freq: Two times a day (BID) | ORAL | Status: DC

## 2016-04-10 NOTE — Telephone Encounter (Signed)
cipro 500mg  po bid for 3 days.  Push water.  Eat yogurt daily while on abx.

## 2016-04-10 NOTE — Telephone Encounter (Signed)
If she is no longer having dysuria, I would not recollect the sample.  I was more suspicious of her pulse rate and inadequate fluid intake as the cause of her symptoms.  If she is still having dysuria, she'll need to come back in to give another urine for culture and I will prescribe her something in the mean time b/c of the lab error.

## 2016-04-10 NOTE — Telephone Encounter (Signed)
Patient still with dysuria (burning and discomfort when urinating). Patient in office now to collect sample. Please advise on rx to be sent in to [facility] on [address]

## 2016-04-10 NOTE — Patient Instructions (Signed)
Medication Instructions: Dr Sallyanne Kuster has recommended making the following medication changes: 1. INCREASE Lisinopril to 20 mg daily 2. START Hydrchlorothiazide 12.5 mg - take 1 tablet (12.5 mg total) by mouth daily  >>New/updated prescriptions have been sent to your pharmacy electronically  Labwork: NONE  Testing/Procedures: NONE  Follow-up: Your physician recommends that you schedule a follow-up appointment in 2 weeks with a NP/PA.  Dr Sallyanne Kuster recommends that you schedule a follow-up appointment in 6 months. You will receive a reminder letter in the mail two months in advance. If you don't receive a letter, please call our office to schedule the follow-up appointment.  If you need a refill on your cardiac medications before your next appointment, please call your pharmacy.

## 2016-04-10 NOTE — Telephone Encounter (Signed)
Spoke with patient's daughter. Brittney Gutierrez will speak with her mother and call back with a status update. If patient still burning she will let us know for a recollection of urine sample

## 2016-04-10 NOTE — Telephone Encounter (Signed)
Patient's daughter Burnard Bunting called requesting urine culture results. Helene Kelp aware I will follow-up and call her back with status update.   I checked with [facility] Tech, and she was not informed that urine sample was to be sent for culture and urine was discarded. Patient will need to submit another urine sample if culture to be sent. Dr.Reed please advise if medication will be sent in or if patient to recollect urine for culture and then medication to be sent

## 2016-04-10 NOTE — Telephone Encounter (Signed)
Discussed with patients daughter.

## 2016-04-11 ENCOUNTER — Ambulatory Visit: Payer: Medicare Other | Admitting: Cardiovascular Disease

## 2016-04-11 DIAGNOSIS — R278 Other lack of coordination: Secondary | ICD-10-CM | POA: Diagnosis not present

## 2016-04-11 DIAGNOSIS — M6281 Muscle weakness (generalized): Secondary | ICD-10-CM | POA: Diagnosis not present

## 2016-04-11 DIAGNOSIS — R2681 Unsteadiness on feet: Secondary | ICD-10-CM | POA: Diagnosis not present

## 2016-04-11 DIAGNOSIS — R41841 Cognitive communication deficit: Secondary | ICD-10-CM | POA: Diagnosis not present

## 2016-04-11 DIAGNOSIS — Z9181 History of falling: Secondary | ICD-10-CM | POA: Diagnosis not present

## 2016-04-11 DIAGNOSIS — I495 Sick sinus syndrome: Secondary | ICD-10-CM | POA: Insufficient documentation

## 2016-04-11 NOTE — Progress Notes (Signed)
Patient ID: Brittney Gutierrez, female   DOB: Jun 14, 1927, 82 y.o.   MRN: [id number]    Cardiology Office Note    Date:  04/11/2016   ID:  Brittney Gutierrez, DOB February 20, 1927, MRN [id number]  PCP:  Lauree Chandler, NP  Cardiologist:   Sanda Klein, MD   Chief Complaint  Patient presents with  . Follow-up    no Chest pain, no shortness of breath, no edema, no pain or cramping in legs, yes frequent lightheaded or dizziness along with night sweats, also patient states that she gets very "gittery"     History of Present Illness:  Brittney Gutierrez is a 82 y.o. female with systemic hypertension and problems with both sinus bradycardia and paroxysmal atrial fibrillation with rapid ventricular response. We are trying to adjust her medications and she wishes to avoid pacemaker implantation if possible. Recently her biggest problem has been really controlled hypertension. She has evidence of hypertensive heart disease with left ventricular hypertrophy but has not had overt congestive heart failure. Home health nursing has been checking her vital signs on a daily basis and her typical systolic blood pressure is in the 180-200 range with diastolic blood pressures in the 70-80 range. Her heart rate at home has been consistently in the mid 50s. She has standing orders to withhold metoprolol if her heart rate is less than 45 but this has not occurred.  She has a questionable hx of TIA is mentioned, she has a CHA2DS2Vasc score of at least 4 (if + TIA would be 7)  Unfortunately, she is felt to be at high risk for anticoagulation due to dementia and fall risk. So far she has tolerated adjusted dose Eliquis without bleeding complication and without recent falls. Lisinopril was added in early April and the dose has been titrated up to 10 mg.  Myoview in Sept 2016 showed no ischemia. Echo shoed preserved LVF with Mild valvular disease. She was admitted to [facility] in Jan 2017 with a similar episode. Rate control has been  difficult and she has had significant bradycardia after conversion to sinus rhythm. Her Holter monitor showed a minimum heart rate of 45 bpm and an average heart rate of 60 bpm as well as multiple episodes of paroxysmal atrial fibrillation with rates in the 160s while on treatment with metoprolol and Diltiazem.   Amiodarone was used briefly but was discontinued roughly a month ago due to itching.  Past Medical History  Diagnosis Date  . Paroxysmal a-fib ([place])   . Hypertension   . Formed visual hallucinations     [name]?  Failed Keppra and Depakote  . Macular degeneration   . Diverticulitis 09/28/2007  . TIA (transient ischemic attack)   . Neurocysticercosis     Craniotomy at [facility] in early 2000s  . Depression 09/06/2002  . UTI (urinary tract infection)   . Paranoia ([place])   . Bradycardia, drug induced   . Leg pain, left   . Pure hypercholesterolemia 12/20/1999  . Anxiety 01/17/2003  . Cerebral atherosclerosis 09/24/1999  . Back pain 08/19/2005    with radiculopathy  . Chest pain, atypical   . Diverticulosis of colon 06/07/2003  . External hemorrhoids 04/12/2009  . Malaise and fatigue   . Osteoarthrosis, localized   . Osteoporosis 05/28/2004  . PSVT (paroxysmal supraventricular tachycardia) ([place]) 02/22/2002  . Palpitations 11/16/2001  . Female climacteric state 03/04/2000  . Abnormal weight loss 05/28/2004  . Urinary incontinence 08/18/2007  . Vaginitis, atrophic   . Vertigo, peripheral 10/07/2000  . Hallucination,  visual   . Depression with anxiety     Past Surgical History  Procedure Laterality Date  . Shoulder surgery Right   . Knee surgery Left 1990  . Brain surgery  2000    Neurocysticercosis  . Vaginal hysterectomy  1960  . Rotator cuff repair  2000  . Cataract extraction  2013    Current Medications: Outpatient Prescriptions Prior to Visit  Medication Sig Dispense Refill  . acetaminophen (TYLENOL) 325 MG tablet Take 650 mg by mouth every 6 (six) hours as needed  for mild pain.    [name] apixaban (ELIQUIS) 2.5 MG TABS tablet Take 1 tablet (2.5 mg total) by mouth 2 (two) times daily. 60 tablet 6  . docusate sodium (COLACE) 100 MG capsule Take 100 mg by mouth 2 (two) times daily.    [name] donepezil (ARICEPT) 10 MG tablet Take 1 tablet (10 mg total) by mouth daily. 30 tablet 2  . Melatonin 3 MG CAPS Take 1 capsule (3 mg total) by mouth at bedtime as needed.  0  . metoprolol tartrate (LOPRESSOR) 25 MG tablet Take 25 mg by mouth 2 (two) times daily. Hold if pulse goes below 45    . risperiDONE (RISPERDAL) 0.25 MG tablet One po qhs x 2 wks, then one half tablet po qhs for 2 wks, then stop 21 tablet 3  . venlafaxine (EFFEXOR) 37.5 MG tablet Take 37.5 mg by mouth 2 (two) times daily.    [name] lisinopril (PRINIVIL,ZESTRIL) 10 MG tablet Take 1 tablet (10 mg total) by mouth daily. 30 tablet 3   No facility-administered medications prior to visit.     Allergies:   Amiodarone; Amoxicillin; and Demerol   Social History   Social History  . Marital Status: Widowed    Spouse Name: N/A  . Number of Children: N/A  . Years of Education: N/A   Social History Main Topics  . Smoking status: Never Smoker   . Smokeless tobacco: Never Used  . Alcohol Use: No  . Drug Use: No  . Sexual Activity: Not Asked   Other Topics Concern  . None   Social History Narrative   DIET: None      DO YOU DRINK/EAT THINGS WITH CAFFEINE: yes      MARITAL STATUS: widow      WHAT YEAR WERE YOU MARRIED: 1948      DO YOU LIVE IN A HOUSE, APARTMENT, ASSISTED LIVING, CONDO TRAILER ETC.: Independent Living      IS IT ONE OR MORE STORIES: 1 story      HOW MANY PERSONS LIVE IN YOUR HOME: 1      DO YOU HAVE PETS IN YOUR HOME: no      CURRENT OR PAST PROFESSION: Book Keeper      DO YOU EXERCISE: no      WHAT TYPE AND HOW OFTEN:     Family History:  The patient's family history includes Heart attack in her father; Hypertension in her sister; Polycystic kidney disease in her mother. There is  no history of Dementia.   ROS:   Please see the history of present illness.    ROS All other systems reviewed and are negative.   PHYSICAL EXAM:   VS:  BP 215/79 mmHg  Pulse 54  Ht 5\' 2"  (1.575 m)  Wt 59.512 kg (131 lb 3.2 oz)  BMI 23.99 kg/m2   GEN: Well nourished, well developed, in no acute distress HEENT: normal Neck: no JVD, carotid bruits, or masses Cardiac: RRR;  no murmurs, rubs, or gallops,no edema  Respiratory:  clear to auscultation bilaterally, normal work of breathing GI: soft, nontender, nondistended, + BS MS: no deformity or atrophy Skin: warm and dry, no rash Neuro:  Alert and Oriented x 3, Strength and sensation are intact Psych: euthymic mood, full affect  Wt Readings from Last 3 Encounters:  04/10/16 59.512 kg (131 lb 3.2 oz)  04/08/16 59.421 kg (131 lb)  04/05/16 59.875 kg (132 lb)      Studies/Labs Reviewed:   EKG:  EKG is not ordered today.    Recent Labs: 02/29/2016: B Natriuretic Peptide 607.6*; Hemoglobin 12.0; Platelets 222 03/01/2016: ALT 27; BUN 22*; Creatinine, Ser 0.92; Magnesium 1.8; Potassium 4.1; Sodium 135; TSH 4.447   Lipid Panel    Component Value Date/Time   CHOL 150 09/21/2013   TRIG 303* 09/21/2013   HDL 45 09/21/2013   LDLCALC 44 09/21/2013     ASSESSMENT:    1. Atrial fibrillation with rapid ventricular response ([place])   2. Essential hypertension   3. SSS (sick sinus syndrome) (HCC)      PLAN:  In order of problems listed above:  1. AFib: At least from a symptomatic point of view, no recent atrial fibrillation. Roughly one month has occurred since she stopped amiodarone. No embolic events recently and no bleeding complications since adding eliquis. Questionable remote history of TIA. 2. HTN: Increase lisinopril to 20 mg daily and add low-dose hydrochlorothiazide. Reevaluate blood pressure in a couple of weeks. She has evidence of hypertensive cardiomyopathy with LVH. BNP has been elevated, but clinically she has not had  overt congestive heart failure. 3. SSS: We are honoring her desire for least invasive approach. However she is now agreeable to undergo pacemaker implantation if we do not have another solution and if she has recurrent atrial fibrillation with rapid ventricular response, rather than starting a different antiarrhythmic, would probably advocate pacemaker implantation and rate control with AV blocking agents in higher doses.    Medication Adjustments/Labs and Tests Ordered: Current medicines are reviewed at length with the patient today.  Concerns regarding medicines are outlined above.  Medication changes, Labs and Tests ordered today are listed in the Patient Instructions below. Patient Instructions  Medication Instructions: Dr Sallyanne Kuster has recommended making the following medication changes: 1. INCREASE Lisinopril to 20 mg daily 2. START Hydrchlorothiazide 12.5 mg - take 1 tablet (12.5 mg total) by mouth daily  >>New/updated prescriptions have been sent to your pharmacy electronically  Labwork: NONE  Testing/Procedures: NONE  Follow-up: Your physician recommends that you schedule a follow-up appointment in 2 weeks with a NP/PA.  Dr Sallyanne Kuster recommends that you schedule a follow-up appointment in 6 months. You will receive a reminder letter in the mail two months in advance. If you don't receive a letter, please call our office to schedule the follow-up appointment.  If you need a refill on your cardiac medications before your next appointment, please call your pharmacy.    Mikael Spray, MD  04/11/2016 8:22 AM    [facility] [address], [place], [place]  [address] Phone: [phone]; Fax: [phone]

## 2016-04-12 LAB — URINE CULTURE

## 2016-04-16 DIAGNOSIS — R278 Other lack of coordination: Secondary | ICD-10-CM | POA: Diagnosis not present

## 2016-04-16 DIAGNOSIS — R2681 Unsteadiness on feet: Secondary | ICD-10-CM | POA: Diagnosis not present

## 2016-04-16 DIAGNOSIS — M6281 Muscle weakness (generalized): Secondary | ICD-10-CM | POA: Diagnosis not present

## 2016-04-16 DIAGNOSIS — Z9181 History of falling: Secondary | ICD-10-CM | POA: Diagnosis not present

## 2016-04-16 DIAGNOSIS — R41841 Cognitive communication deficit: Secondary | ICD-10-CM | POA: Diagnosis not present

## 2016-04-17 ENCOUNTER — Ambulatory Visit: Payer: Medicare Other | Admitting: Cardiovascular Disease

## 2016-04-17 ENCOUNTER — Telehealth: Payer: Self-pay

## 2016-04-17 NOTE — Telephone Encounter (Signed)
Sounds like she needs another appt.  There are a few issues here.

## 2016-04-17 NOTE — Telephone Encounter (Signed)
Spoke with patient's daughter, scheduled appointment for tomorrow with [facility]

## 2016-04-17 NOTE — Telephone Encounter (Signed)
[name] called to get recommendations from Dr.Reed. Patient c/o choking feeling, feels like something in throat since taking Lisinopril. Patient with improvement on B/P since Lisinopril was increased, top number down to 160 was at 178. Patient also c/o pounding in head at night x 2 days (not sure if related to any medications), the pounding is not a headache.   Please advise

## 2016-04-18 ENCOUNTER — Ambulatory Visit (INDEPENDENT_AMBULATORY_CARE_PROVIDER_SITE_OTHER): Payer: Medicare Other | Admitting: Nurse Practitioner

## 2016-04-18 ENCOUNTER — Encounter: Payer: Self-pay | Admitting: Nurse Practitioner

## 2016-04-18 VITALS — BP 142/74 | HR 55 | Temp 98.1°F | Ht 62.0 in | Wt 129.8 lb

## 2016-04-18 DIAGNOSIS — I248 Other forms of acute ischemic heart disease: Secondary | ICD-10-CM

## 2016-04-18 DIAGNOSIS — F329 Major depressive disorder, single episode, unspecified: Secondary | ICD-10-CM | POA: Diagnosis not present

## 2016-04-18 DIAGNOSIS — F32A Depression, unspecified: Secondary | ICD-10-CM

## 2016-04-18 DIAGNOSIS — Z9181 History of falling: Secondary | ICD-10-CM | POA: Diagnosis not present

## 2016-04-18 DIAGNOSIS — I10 Essential (primary) hypertension: Secondary | ICD-10-CM

## 2016-04-18 DIAGNOSIS — R2681 Unsteadiness on feet: Secondary | ICD-10-CM | POA: Diagnosis not present

## 2016-04-18 DIAGNOSIS — M6281 Muscle weakness (generalized): Secondary | ICD-10-CM | POA: Diagnosis not present

## 2016-04-18 DIAGNOSIS — K219 Gastro-esophageal reflux disease without esophagitis: Secondary | ICD-10-CM

## 2016-04-18 DIAGNOSIS — I4891 Unspecified atrial fibrillation: Secondary | ICD-10-CM

## 2016-04-18 DIAGNOSIS — R41841 Cognitive communication deficit: Secondary | ICD-10-CM | POA: Diagnosis not present

## 2016-04-18 DIAGNOSIS — R278 Other lack of coordination: Secondary | ICD-10-CM | POA: Diagnosis not present

## 2016-04-18 MED ORDER — OMEPRAZOLE 20 MG PO CPDR: 20.0000 mg | DELAYED_RELEASE_CAPSULE | Freq: Every day | ORAL | Status: DC

## 2016-04-18 NOTE — Assessment & Plan Note (Signed)
Continue Aricept 

## 2016-04-18 NOTE — Assessment & Plan Note (Signed)
C/o not sleep well at night, not new, continue Effexor 37.5mg  bid, tapering off Risperdal

## 2016-04-18 NOTE — Assessment & Plan Note (Signed)
Omeprazole 20mg daily.  °

## 2016-04-18 NOTE — Progress Notes (Signed)
Patient ID: Brittney Gutierrez, female   DOB: 05/26/1927, 82 y.o.   MRN: [id number]   Location:    [facility]    Place of Service:   Clinic  Provider: [facility] Mckinnon Glick NP  Code Status: DNR Goals of Care:  Advanced Directives 04/05/2016  Does patient have an advance directive? Yes  Type of Advance Directive -  Does patient want to make changes to advanced directive? -  Copy of advanced directive(s) in chart? No - copy requested     Chief Complaint  Patient presents with  . Medication Management    patient states that it feels like something is in her throat, her daughter [name] states that she believe its the lisinopril thats causing the feeling     HPI: Patient is a 82 y.o. female seen today for an acute visit for c/o something in her throat, coughing occasionally, no phlegm production, denied chest pain, wheezing, or chocking, she took Ranitidine in the past. Lisinopril is new, thought this may contributory. Also she just restarted her Aricept. Hx of Afib, heart rate is in control, taking Metoprolol and Eliquis.   Past Medical History  Diagnosis Date  . Paroxysmal a-fib ([place])   . Hypertension   . Formed visual hallucinations     [name]?  Failed Keppra and Depakote  . Macular degeneration   . Diverticulitis 09/28/2007  . TIA (transient ischemic attack)   . Neurocysticercosis     Craniotomy at [facility] in early 2000s  . Depression 09/06/2002  . UTI (urinary tract infection)   . Paranoia ([place])   . Bradycardia, drug induced   . Leg pain, left   . Pure hypercholesterolemia 12/20/1999  . Anxiety 01/17/2003  . Cerebral atherosclerosis 09/24/1999  . Back pain 08/19/2005    with radiculopathy  . Chest pain, atypical   . Diverticulosis of colon 06/07/2003  . External hemorrhoids 04/12/2009  . Malaise and fatigue   . Osteoarthrosis, localized   . Osteoporosis 05/28/2004  . PSVT (paroxysmal supraventricular tachycardia) ([place]) 02/22/2002  . Palpitations 11/16/2001  . Female climacteric  state 03/04/2000  . Abnormal weight loss 05/28/2004  . Urinary incontinence 08/18/2007  . Vaginitis, atrophic   . Vertigo, peripheral 10/07/2000  . Hallucination, visual   . Depression with anxiety     Past Surgical History  Procedure Laterality Date  . Shoulder surgery Right   . Knee surgery Left 1990  . Brain surgery  2000    Neurocysticercosis  . Vaginal hysterectomy  1960  . Rotator cuff repair  2000  . Cataract extraction  2013    Allergies  Allergen Reactions  . Amiodarone Itching  . Amoxicillin Other (See Comments)    Unknown- ? Upset stomach  . Demerol [Meperidine]     See things      Medication List       This list is accurate as of: 04/18/16 10:51 AM.  Always use your most recent med list.               acetaminophen 325 MG tablet  Commonly known as:  TYLENOL  Take 650 mg by mouth every 6 (six) hours as needed for mild pain.     apixaban 2.5 MG Tabs tablet  Commonly known as:  ELIQUIS  Take 1 tablet (2.5 mg total) by mouth 2 (two) times daily.     docusate sodium 100 MG capsule  Commonly known as:  COLACE  Take 100 mg by mouth 2 (two) times daily.  donepezil 10 MG tablet  Commonly known as:  ARICEPT  Take 1 tablet (10 mg total) by mouth daily.     hydrochlorothiazide 12.5 MG capsule  Commonly known as:  MICROZIDE  Take 1 capsule (12.5 mg total) by mouth daily.     lisinopril 20 MG tablet  Commonly known as:  PRINIVIL,ZESTRIL  Take 1 tablet (20 mg total) by mouth daily.     Melatonin 3 MG Caps  Take 1 capsule (3 mg total) by mouth at bedtime as needed.     metoprolol tartrate 25 MG tablet  Commonly known as:  LOPRESSOR  Take 25 mg by mouth 2 (two) times daily. Hold if pulse goes below 45     risperiDONE 0.25 MG tablet  Commonly known as:  RISPERDAL  One po qhs x 2 wks, then one half tablet po qhs for 2 wks, then stop     venlafaxine 37.5 MG tablet  Commonly known as:  EFFEXOR  Take 37.5 mg by mouth 2 (two) times daily.         Review of Systems:  Review of Systems  Constitutional: Positive for diaphoresis. Negative for fever and chills.  HENT:       Feels something in her throat  Respiratory: Negative for cough and shortness of breath.   Cardiovascular: Negative for chest pain, palpitations and leg swelling.  Gastrointestinal: Negative for abdominal pain.  Genitourinary: Positive for dysuria, urgency and frequency. Negative for hematuria and flank pain.       No suprapubic pain, no flank pain  Neurological: Positive for dizziness. Negative for weakness.  Psychiatric/Behavioral: Negative for hallucinations.       Chronically sees bluish figures with macular    Health Maintenance  Topic Date Due  . ZOSTAVAX  11/10/1989  . DEXA SCAN  11/10/1994  . INFLUENZA VACCINE  07/09/2016  . TETANUS/TDAP  05/14/2022  . PNA vac Low Risk Adult  Completed    Physical Exam: Filed Vitals:   04/18/16 0959 04/18/16 1004  BP: 142/72 142/74  Pulse: 55   Temp: 98.1 F (36.7 C)   TempSrc: Oral   Height: 5\' 2"  (1.575 m)   Weight: 129 lb 12.8 oz (58.877 kg)   SpO2: 99%    Body mass index is 23.73 kg/(m^2). Physical Exam  Constitutional: She appears well-developed and well-nourished. No distress.  HENT:  Head: Normocephalic and atraumatic.  Mouth/Throat: Oropharynx is clear and moist.  tortuous platinous   Cardiovascular: Regular rhythm, normal heart sounds and intact distal pulses.   bradycardic  Pulmonary/Chest: Effort normal and breath sounds normal. No respiratory distress. She has no rales.  Abdominal: Soft. Bowel sounds are normal. She exhibits no distension and no mass. There is no tenderness. There is no rebound and no guarding.  Genitourinary:  No suprapubic tenderness or CVA tenderness  Musculoskeletal: Normal range of motion.  Neurological: She is alert.  Skin: Skin is warm and dry. There is pallor.  Psychiatric: She has a normal mood and affect.    Labs reviewed: Basic Metabolic  Panel:  Recent Labs  12/26/15 1536  01/16/16 1058 02/29/16 1713 02/29/16 2056 03/01/16 0330  NA 139  < > 139 136  --  135  K 3.0*  < > 4.3 4.4  --  4.1  CL 98*  < > 98 103  --  102  CO2 28  < > 22 24  --  23  GLUCOSE 100*  < > 80 109*  --  124*  BUN 12  < >  18 25*  --  22*  CREATININE 0.89  < > 0.73 1.06* 0.97 0.92  CALCIUM 9.5  < > 9.3 9.3  --  9.0  MG 1.8  --   --   --   --  1.8  TSH  --   --  6.250*  --   --  4.447  < > = values in this interval not displayed. Liver Function Tests:  Recent Labs  12/26/15 1536 01/16/16 1058 03/01/16 0330  AST 26 14 25   ALT 17 15 27   ALKPHOS 101 114 91  BILITOT 0.6 <0.2 0.7  PROT 8.2* 7.1 6.5  ALBUMIN 3.8 3.9 3.0*   No results for input(s): LIPASE, AMYLASE in the last 8760 hours. No results for input(s): AMMONIA in the last 8760 hours. CBC:  Recent Labs  12/28/15 0314 01/16/16 1058 02/29/16 1713 02/29/16 2056  WBC 5.0 4.9 7.5 8.5  NEUTROABS  --  3.1  --   --   HGB 10.7*  --  11.7* 12.0  HCT 33.4* 35.9 36.5 37.1  MCV 88.1 87 89.9 89.0  PLT 243 266 214 222   Lipid Panel: No results for input(s): CHOL, HDL, LDLCALC, TRIG, CHOLHDL, LDLDIRECT in the last 8760 hours. No results found for: HGBA1C  Procedures since last visit: No results found.  Assessment/Plan GERD (gastroesophageal reflux disease) Omeprazole 20mg  daily.   Essential hypertension Controlled, continue Lisinopril 20mg , Metoprolol 25mg  bid  Atrial fibrillation with rapid ventricular response (HCC) Heart rate is in control, continue Metoprolol 25mg  bid. Eliquis 2.5mg  bid.   Demand ischemia ([place]) Continue Aricept  Depression C/o not sleep well at night, not new, continue Effexor 37.5mg  bid, tapering off Risperdal     Labs/tests ordered:  @ORDERS @ Next appt:  07/08/2016

## 2016-04-18 NOTE — Assessment & Plan Note (Signed)
Controlled, continue Lisinopril 20mg , Metoprolol 25mg  bid

## 2016-04-18 NOTE — Assessment & Plan Note (Signed)
Heart rate is in control, continue Metoprolol 25mg  bid. Eliquis 2.5mg  bid.

## 2016-04-24 ENCOUNTER — Encounter (HOSPITAL_COMMUNITY): Payer: Self-pay | Admitting: Emergency Medicine

## 2016-04-24 ENCOUNTER — Emergency Department (HOSPITAL_COMMUNITY)
Admission: EM | Admit: 2016-04-24 | Discharge: 2016-04-25 | Disposition: A | Discharge status: Home / Self Care | Payer: Medicare Other | Attending: Emergency Medicine | Admitting: Emergency Medicine

## 2016-04-24 DIAGNOSIS — Z8669 Personal history of other diseases of the nervous system and sense organs: Secondary | ICD-10-CM | POA: Insufficient documentation

## 2016-04-24 DIAGNOSIS — F418 Other specified anxiety disorders: Secondary | ICD-10-CM | POA: Diagnosis not present

## 2016-04-24 DIAGNOSIS — I4891 Unspecified atrial fibrillation: Secondary | ICD-10-CM | POA: Diagnosis not present

## 2016-04-24 DIAGNOSIS — Z8639 Personal history of other endocrine, nutritional and metabolic disease: Secondary | ICD-10-CM | POA: Diagnosis not present

## 2016-04-24 DIAGNOSIS — I499 Cardiac arrhythmia, unspecified: Secondary | ICD-10-CM | POA: Insufficient documentation

## 2016-04-24 DIAGNOSIS — M810 Age-related osteoporosis without current pathological fracture: Secondary | ICD-10-CM | POA: Diagnosis not present

## 2016-04-24 DIAGNOSIS — Z8619 Personal history of other infectious and parasitic diseases: Secondary | ICD-10-CM | POA: Diagnosis not present

## 2016-04-24 DIAGNOSIS — Z8719 Personal history of other diseases of the digestive system: Secondary | ICD-10-CM | POA: Diagnosis not present

## 2016-04-24 DIAGNOSIS — Z7901 Long term (current) use of anticoagulants: Secondary | ICD-10-CM | POA: Diagnosis not present

## 2016-04-24 DIAGNOSIS — R000 Tachycardia, unspecified: Secondary | ICD-10-CM | POA: Diagnosis not present

## 2016-04-24 DIAGNOSIS — Z79899 Other long term (current) drug therapy: Secondary | ICD-10-CM | POA: Diagnosis not present

## 2016-04-24 DIAGNOSIS — Z8673 Personal history of transient ischemic attack (TIA), and cerebral infarction without residual deficits: Secondary | ICD-10-CM | POA: Insufficient documentation

## 2016-04-24 DIAGNOSIS — Z880 Allergy status to penicillin: Secondary | ICD-10-CM | POA: Insufficient documentation

## 2016-04-24 DIAGNOSIS — R079 Chest pain, unspecified: Secondary | ICD-10-CM | POA: Diagnosis present

## 2016-04-24 DIAGNOSIS — I10 Essential (primary) hypertension: Secondary | ICD-10-CM | POA: Insufficient documentation

## 2016-04-24 DIAGNOSIS — Z87440 Personal history of urinary (tract) infections: Secondary | ICD-10-CM | POA: Diagnosis not present

## 2016-04-24 DIAGNOSIS — I480 Paroxysmal atrial fibrillation: Secondary | ICD-10-CM | POA: Diagnosis not present

## 2016-04-24 LAB — I-STAT CHEM 8, ED
BUN: 18 mg/dL (ref 6–20)
CALCIUM ION: 1.11 mmol/L — AB (ref 1.13–1.30)
CHLORIDE: 94 mmol/L — AB (ref 101–111)
CREATININE: 0.8 mg/dL (ref 0.44–1.00)
GLUCOSE: 106 mg/dL — AB (ref 65–99)
HCT: 40 % (ref 36.0–46.0)
Hemoglobin: 13.6 g/dL (ref 12.0–15.0)
POTASSIUM: 3 mmol/L — AB (ref 3.5–5.1)
Sodium: 136 mmol/L (ref 135–145)
TCO2: 28 mmol/L (ref 0–100)

## 2016-04-24 MED ORDER — POTASSIUM CHLORIDE CRYS ER 20 MEQ PO TBCR
40.0000 meq | EXTENDED_RELEASE_TABLET | Freq: Once | ORAL | Status: AC
  Administered 2016-04-25: 40 meq via ORAL
  Filled 2016-04-24: qty 2

## 2016-04-24 MED ORDER — PROPOFOL 10 MG/ML IV BOLUS
60.0000 mg | Freq: Once | INTRAVENOUS | Status: DC
  Filled 2016-04-24: qty 20

## 2016-04-24 NOTE — ED Notes (Signed)
Per EMS, pt from [facility] with c/o chest pain and A-fib. HR initially 140-200, given 10mg  cardiazem, HR in the 150s after cardiazem. Given 324 aspirin PTA. Denies n/v/d, shortness of breath. BP-115/60, 98% 2L O2.

## 2016-04-24 NOTE — Sedation Documentation (Signed)
15 mg propofol given, pt's HR decreased to 60bpm. Per Dr. Laneta Simmers, no cardioversion needed at this time.

## 2016-04-24 NOTE — ED Provider Notes (Signed)
CSN: [id number]     Arrival date & time 04/24/16  2140 History   First MD Initiated Contact with Patient 04/24/16 2145     Chief Complaint  Patient presents with  . Atrial Fibrillation     (Consider location/radiation/quality/duration/timing/severity/associated sxs/prior Treatment) Patient is a 82 y.o. female presenting with general illness. The history is provided by the patient.  Illness Location:  Left-sided chest pain. Severity:  Mild Onset quality:  Sudden Duration:  2 hours Timing:  Constant Progression:  Unchanged Chronicity:  New Context:  History of AFIB, on eliquis, metoprolol. 2 hours prior to arrival had onset of left-sided chest discomfort. No diaphoresis, shortness of breath, nausea, emesis, back pain, lightheadedness. Associated symptoms: chest pain   Associated symptoms: no abdominal pain, no cough, no diarrhea, no fever, no headaches, no nausea, no shortness of breath and no vomiting     Past Medical History  Diagnosis Date  . Paroxysmal a-fib ([place])   . Hypertension   . Formed visual hallucinations     [name]?  Failed Keppra and Depakote  . Macular degeneration   . Diverticulitis 09/28/2007  . TIA (transient ischemic attack)   . Neurocysticercosis     Craniotomy at [facility] in early 2000s  . Depression 09/06/2002  . UTI (urinary tract infection)   . Paranoia ([place])   . Bradycardia, drug induced   . Leg pain, left   . Pure hypercholesterolemia 12/20/1999  . Anxiety 01/17/2003  . Cerebral atherosclerosis 09/24/1999  . Back pain 08/19/2005    with radiculopathy  . Chest pain, atypical   . Diverticulosis of colon 06/07/2003  . External hemorrhoids 04/12/2009  . Malaise and fatigue   . Osteoarthrosis, localized   . Osteoporosis 05/28/2004  . PSVT (paroxysmal supraventricular tachycardia) ([place]) 02/22/2002  . Palpitations 11/16/2001  . Female climacteric state 03/04/2000  . Abnormal weight loss 05/28/2004  . Urinary incontinence 08/18/2007  . Vaginitis,  atrophic   . Vertigo, peripheral 10/07/2000  . Hallucination, visual   . Depression with anxiety    Past Surgical History  Procedure Laterality Date  . Shoulder surgery Right   . Knee surgery Left 1990  . Brain surgery  2000    Neurocysticercosis  . Vaginal hysterectomy  1960  . Rotator cuff repair  2000  . Cataract extraction  2013   Family History  Problem Relation Age of Onset  . Dementia Neg Hx   . Polycystic kidney disease Mother   . Heart attack Father   . Hypertension Sister    Social History  Substance Use Topics  . Smoking status: Never Smoker   . Smokeless tobacco: Never Used  . Alcohol Use: No   OB History    No data available     Review of Systems  Constitutional: Negative for fever and chills.  Respiratory: Negative for cough and shortness of breath.   Cardiovascular: Positive for chest pain and palpitations. Negative for leg swelling.  Gastrointestinal: Negative for nausea, vomiting, abdominal pain and diarrhea.  Musculoskeletal: Negative for back pain.  Neurological: Negative for light-headedness and headaches.  All other systems reviewed and are negative.     Allergies  Amiodarone; Amoxicillin; Demerol; and Tape  Home Medications   Prior to Admission medications   Medication Sig Start Date End Date Taking? Authorizing Provider  acetaminophen (TYLENOL) 325 MG tablet Take 650 mg by mouth every 6 (six) hours as needed for mild pain.   Yes Historical Provider, MD  apixaban (ELIQUIS) 2.5 MG TABS tablet Take 1  tablet (2.5 mg total) by mouth 2 (two) times daily. 03/04/16  Yes Bhavinkumar Bhagat, PA  calcium carbonate (TUMS - DOSED IN MG ELEMENTAL CALCIUM) 500 MG chewable tablet Chew 1 tablet by mouth 2 (two) times daily as needed for indigestion or heartburn.   Yes Historical Provider, MD  docusate sodium (COLACE) 100 MG capsule Take 100 mg by mouth 2 (two) times daily.   Yes Historical Provider, MD  donepezil (ARICEPT) 10 MG tablet Take 1 tablet (10 mg  total) by mouth daily. 04/06/16  Yes Tiffany L Reed, DO  hydrochlorothiazide (MICROZIDE) 12.5 MG capsule Take 1 capsule (12.5 mg total) by mouth daily. 04/10/16  Yes Mihai Croitoru, MD  lisinopril (PRINIVIL,ZESTRIL) 20 MG tablet Take 1 tablet (20 mg total) by mouth daily. 04/10/16  Yes Mihai Croitoru, MD  Melatonin 3 MG CAPS Take 1 capsule (3 mg total) by mouth at bedtime as needed. Patient taking differently: Take 3 mg by mouth at bedtime.  02/12/16  Yes Lauree Chandler, NP  metoprolol tartrate (LOPRESSOR) 25 MG tablet Take 25 mg by mouth 2 (two) times daily. Hold if pulse goes below 45   Yes Historical Provider, MD  omeprazole (PRILOSEC) 20 MG capsule Take 1 capsule (20 mg total) by mouth daily. 04/18/16  Yes Man Mast X, NP  risperiDONE (RISPERDAL) 0.25 MG tablet One po qhs x 2 wks, then one half tablet po qhs for 2 wks, then stop 04/05/16  Yes Tiffany L Reed, DO  tobramycin (TOBREX) 0.3 % ophthalmic solution Use as directed for shots (related to macular degeneration) into affected eye 03/11/16  Yes Historical Provider, MD  venlafaxine (EFFEXOR) 37.5 MG tablet Take 37.5 mg by mouth 2 (two) times daily.   Yes Historical Provider, MD   BP 124/50 mmHg  Pulse 59  Resp 18  SpO2 99% Physical Exam  Constitutional: She is oriented to person, place, and time. She appears well-developed and well-nourished. No distress.  HENT:  Head: Normocephalic and atraumatic.  Eyes: EOM are normal. Pupils are equal, round, and reactive to light.  Neck: Normal range of motion.  Cardiovascular: An irregularly irregular rhythm present. Tachycardia present.   Pulmonary/Chest: No tachypnea. No respiratory distress. She has no wheezes. She has no rhonchi.  Abdominal: Soft. Normal appearance. There is no tenderness.  Musculoskeletal:       Right lower leg: She exhibits no swelling and no edema.       Left lower leg: She exhibits no swelling and no edema.  Neurological: She is alert and oriented to person, place, and time. GCS  eye subscore is 4. GCS verbal subscore is 5. GCS motor subscore is 6.  Skin: Skin is warm and dry.    ED Course  Procedures (including critical care time) Labs Review Labs Reviewed  I-STAT CHEM 8, ED - Abnormal; Notable for the following:    Potassium 3.0 (*)    Chloride 94 (*)    Glucose, Bld 106 (*)    Calcium, Ion 1.11 (*)    All other components within normal limits    Imaging Review No results found. I have personally reviewed and evaluated these images and lab results as part of my medical decision-making.   EKG Interpretation   Date/Time:  Wednesday Apr 24 2016 23:36:53 EDT Ventricular Rate:  64 PR Interval:  147 QRS Duration: 122 QT Interval:  431 QTC Calculation: 445 R Axis:   -39 Text Interpretation:  Sinus rhythm LVH with IVCD, LAD and secondary repol  abnrm Atrial fibrillation  RESOLVED SINCE PREVIOUS Confirmed by KNOTT MD,  DANIEL [phone]) on 04/24/2016 11:42:21 PM      MDM   Final diagnoses:  Atrial fibrillation with rapid ventricular response ([place])    82 year old female with a history of atrial fibrillation presenting in AFIB with RVR. Patient has been on Eliquis for several months. Does not miss any doses. Is normally in AFIB, rate controlled. Today noticed that she had onset of left-sided chest discomfort and racing heart. On arrival here the patient is well-appearing in no acute distress. Irregularly irregular rhythm with tachycardia into the 140s to 150s. Normotensive. Vital signs otherwise normal. Cardiopulmonary exam otherwise normal. Hemoglobin is normal. Patient does have slight hypokalemia. We will replete that here.  Initial plan was to sedate the patient with propofol and perform synchronous cardioversion. Spoke with on call cardiology, who were in agreement with the plan. However, after explaining the procedure, and giving approximately 15 mg of propofol, the patient spontaneously went back into a sinus rhythm. No shock was required. EKG following  confirm that the patient was back in a sinus rhythm and no longer tachycardic.  Patient was observed for a while after receiving propofol. Continued to be in a sinus rhythm. Ambulatory throughout the emergency department without issue. She is now reporting complete resolution of her chest pain. Doubt ACS.  Strict return precautions provided. Encouraged her to follow up with her primary care doctor at her facility for reevaluation. Encouraged to have a repeat potassium level at the facility given hypokalemia here.  Patient discharged in stable condition.  Maryan Puls, MD 04/25/16 [id number]  Leo Grosser, MD 04/25/16 [phone]

## 2016-04-24 NOTE — ED Provider Notes (Signed)
I saw and evaluated the patient, reviewed the resident's note and I agree with the findings and plan. Please see associated encounter note.   EKG Interpretation   Date/Time:  Wednesday Apr 24 2016 21:50:06 EDT Ventricular Rate:  139 PR Interval:    QRS Duration: 114 QT Interval:  345 QTC Calculation: 525 R Axis:   -37 Text Interpretation:  Atrial fibrillation with rapid ventricular response  New since previous tracing Abnormal R-wave progression, early transition  LVH with IVCD, LAD and secondary repol abnrm Prolonged QT interval  Confirmed by Arrayah Connors MD, Maranatha Grossi [phone]) on 04/24/2016 10:20:37 PM      EKG Interpretation  Date/Time:  Wednesday Apr 24 2016 23:36:53 EDT Ventricular Rate:  18 PR Interval:  147 QRS Duration: 122 QT Interval:  431 QTC Calculation: 445 R Axis:   -39 Text Interpretation:  Sinus rhythm LVH with IVCD, LAD and secondary repol abnrm Atrial fibrillation RESOLVED SINCE PREVIOUS Confirmed by Ornella Coderre MD, Quillian Quince [id number]) on 04/24/2016 11:42:21 PM        82 y.o. female presents with Recurrent atrial fibrillation with rapid ventricular response. Labs unremarkable except for mild hypokalemia. Discussed with cardiology regarding cardioversion given that the patient is anticoagulated on eliquis and had onset of atrial fibrillation earlier today. She is otherwise well-appearing at her stated baseline with stable blood pressure. Discussed risks and benefits of cardioversion with the patient and family. While preparing to perform cardioversion and asking the patient to relax prior to administration of propofol she converted to a sinus rhythm. She had gotten 15 mg of propofol but never lost consciousness, no shocks were delivered. We'll plan for monitoring in the emergency department for maintaining sinus rhythm but suspect discharge with some supplementary potassium by mouth. EKG with nonspecific repolarization abnormalities that are likely related to sustained high heart rate.  Patient not having chest pain or any active signs of ischemia so do not feel that further workup for ACS is warranted currently.  Leo Grosser, MD 04/25/16 [phone]

## 2016-04-25 DIAGNOSIS — I4891 Unspecified atrial fibrillation: Secondary | ICD-10-CM | POA: Diagnosis not present

## 2016-04-25 MED ORDER — POTASSIUM CHLORIDE ER 10 MEQ PO TBCR: 10.0000 meq | EXTENDED_RELEASE_TABLET | Freq: Every day | ORAL | Status: DC

## 2016-04-25 NOTE — ED Notes (Signed)
Pt received 15mg  propofol, pt's HR decreased to 60bpm. No cardioversion at this time per Dr. Laneta Simmers. Pt in sinus brady at this time.

## 2016-04-25 NOTE — ED Notes (Signed)
Pt's aldrete score 10 at discharge. Pt discharged to [facility] independent living. Daughter to take pt home. [place] notified that pt will be returning.

## 2016-04-25 NOTE — ED Notes (Signed)
Pt ambulated without difficulty

## 2016-04-25 NOTE — ED Notes (Signed)
Ambulated pt to the bathroom and back with minimal assistance, pt walked with steady gait.

## 2016-04-25 NOTE — Discharge Instructions (Signed)
You are now back in a regular sinus rhythm. See your primary care doctor in 1-2 days for reevaluation.  You had low potassium. Take potassium as prescribed and have your primary care doctor recheck your potassium in a few days

## 2016-04-29 ENCOUNTER — Ambulatory Visit (INDEPENDENT_AMBULATORY_CARE_PROVIDER_SITE_OTHER): Payer: Medicare Other | Admitting: Nurse Practitioner

## 2016-04-29 ENCOUNTER — Telehealth: Payer: Self-pay | Admitting: Cardiovascular Disease

## 2016-04-29 ENCOUNTER — Telehealth: Payer: Self-pay | Admitting: *Deleted

## 2016-04-29 ENCOUNTER — Encounter: Payer: Self-pay | Admitting: Nurse Practitioner

## 2016-04-29 VITALS — BP 200/90 | HR 58 | Ht 62.0 in | Wt 130.4 lb

## 2016-04-29 DIAGNOSIS — I10 Essential (primary) hypertension: Secondary | ICD-10-CM | POA: Diagnosis not present

## 2016-04-29 DIAGNOSIS — I248 Other forms of acute ischemic heart disease: Secondary | ICD-10-CM

## 2016-04-29 LAB — BASIC METABOLIC PANEL
BUN: 14 mg/dL (ref 7–25)
CO2: 28 mmol/L (ref 20–31)
Calcium: 9.6 mg/dL (ref 8.6–10.4)
Chloride: 94 mmol/L — ABNORMAL LOW (ref 98–110)
Creat: 0.84 mg/dL (ref 0.60–0.88)
Glucose, Bld: 78 mg/dL (ref 65–99)
Potassium: 3.9 mmol/L (ref 3.5–5.3)
Sodium: 133 mmol/L — ABNORMAL LOW (ref 135–146)

## 2016-04-29 MED ORDER — HYDROCHLOROTHIAZIDE 12.5 MG PO CAPS: 12.5000 mg | ORAL_CAPSULE | Freq: Every day | ORAL | Status: DC

## 2016-04-29 MED ORDER — LOSARTAN POTASSIUM 100 MG PO TABS: 100.0000 mg | ORAL_TABLET | Freq: Every day | ORAL | Status: DC

## 2016-04-29 MED ORDER — HYDRALAZINE HCL 10 MG PO TABS: 10.0000 mg | ORAL_TABLET | Freq: Two times a day (BID) | ORAL | Status: DC

## 2016-04-29 NOTE — Patient Instructions (Addendum)
We will be checking the following labs today - BMET   Medication Instructions:    Continue with your current medicines. BUT  STOP Lisinopril - this will hopefully make your throat better  START Losartan 100 mg a day - this is at the drug store  START Hydralazine 10 mg twice a day - this is at the drug store    Testing/Procedures To Be Arranged:  N/A  Follow-Up:   Keep follow up on Thursday as planned.     Other Special Instructions:   N/A    If you need a refill on your cardiac medications before your next appointment, please call your pharmacy.   Call the [place] office at [phone] if you have any questions, problems or concerns.

## 2016-04-29 NOTE — Telephone Encounter (Signed)
Per pt's daughter call:   This morning pt called her and is complaining of not feeling, well dizzy and BP his very high.   Daughter was not able to give me exact numbers.  Per daughter pt needs to be seen today.

## 2016-04-29 NOTE — Telephone Encounter (Signed)
Patient daughter, [name] called and wanted to know what her mother could take that's OTC for a dripping nose. Please Advise.

## 2016-04-29 NOTE — Telephone Encounter (Signed)
Spoke to daughter  patient has an appointment on 05/02/16,but would like to move it up Patient lives at [facility] independent [facility]  C/o dizziness, not feeling well, elevated blood pressure ?190's  (sbp) per patient's daughter [name] to ER ON 04/24/18  APPOINTMENT SCHEDULE FOR 3 PM 04/29/16 WITH LORI GERHARDT  DAUGHTER VERBALIZED UNDERSTANDING.

## 2016-04-29 NOTE — Telephone Encounter (Signed)
Patient daughter notified and will discuss with her mother.

## 2016-04-29 NOTE — Telephone Encounter (Signed)
I don't recommend an otc med for her dripping nose b/c they will potentially cause her to be more confused.  If they want to try one anyway, I recommend cetirizine 10mg  po daily for only 3-4 wks maximum.

## 2016-04-29 NOTE — Progress Notes (Signed)
CARDIOLOGY OFFICE NOTE  Date:  04/29/2016    Brittney Gutierrez Date of Birth: 07/16/1927 Medical Record [id number]  PCP:  Brittney Chandler, NP  Cardiologist:  Brittney Gutierrez    Chief Complaint  Patient presents with  . Hypertension    Work in visit - seen for Dr. Sallyanne Gutierrez    History of Present Illness: Brittney Gutierrez is a 82 y.o. female who presents today for a work in visit. Seen for Dr. Sallyanne Gutierrez.  Just seen about 2 weeks ago. She has a history of systemic hypertension and problems with both sinus bradycardia and paroxysmal atrial fibrillation with rapid ventricular response. Goal has been to try to adjust her medications and she wishes to avoid pacemaker implantation if possible. Recently her biggest problem has been really controlled hypertension. She has evidence of hypertensive heart disease with left ventricular hypertrophy but has not had overt congestive heart failure. Home health nursing has been checking her vital signs on a daily basis and her typical systolic blood pressure is in the 180-200 range with diastolic blood pressures in the 70-80 range. Her heart rate at home has been consistently in the mid 50s. She has standing orders to withhold metoprolol if her heart rate is less than 45 but this has not occurred.  She has a questionable hx of TIA mentioned, she has a CHA2DS2Vasc score of at least 4 (if + TIA would be 7) Unfortunately, she is felt to be at high risk for anticoagulation due to dementia and fall risk. So far she has tolerated adjusted dose Eliquis without bleeding complication and without recent falls. Lisinopril was added in early April and the dose has been titrated up to 10 mg.  Myoview in Sept 2016 showed no ischemia. Echo shoed preserved LVF withmild valvular disease. She was admitted to [facility] in Jan 2017 with a similar episode. Rate control has been difficult and she has had significant bradycardia after conversion to sinus rhythm. Her Holter monitor showed  a minimum heart rate of 45 bpm and an average heart rate of 60 bpm as well as multiple episodes of paroxysmal atrial fibrillation with rates in the 160s while on treatment with metoprolol and Diltiazem.   Amiodarone was used briefly but was discontinued roughly a month ago due to itching.  Was in the ER last Wednesday - was in AF with RVR - was going to get cardioverted - she did convert back to NSR and got 15mg  of Propofol but apparently did not lose consciousness and no shock was delivered.    Phone call earlier today - Spoke to daughter - patient has an appointment on 05/02/16,but would like to move it up - Patient lives at [facility]- independent living C/o dizziness, not feeling well, elevated blood pressure ?190's (sbp) per patient's daughter  Brittney Gutierrez to ER ON 04/24/18 APPOINTMENT SCHEDULE FOR 3 PM 04/29/16 WITH Brittney Gutierrez DAUGHTER VERBALIZED UNDERSTANDING.  Comes in today. Here with her daughter. She says she just feels "yucky". Hard to be more specific. She also notes that she is constantly clearing her throat - has gotten worse as the dose of Lisinopril has been increased. Chest feels tight at times. She has had some intermittent blurry vision. No falls. May get too much salt. HR pretty consistently in the 50's and she has been getting her Metoprolol regularly. No syncope. She has frequent urination. BP diary is reviewed. Most readings above 170 - some over 200. Several in the 190's. No real consistent reading noted.  Past Medical History  Diagnosis Date  . Paroxysmal a-fib ([place])   . Hypertension   . Formed visual hallucinations     Brittney Gutierrez?  Failed Keppra and Depakote  . Macular degeneration   . Diverticulitis 09/28/2007  . TIA (transient ischemic attack)   . Neurocysticercosis     Craniotomy at [facility] in early 2000s  . Depression 09/06/2002  . UTI (urinary tract infection)   . Paranoia ([place])   . Bradycardia, drug induced   . Leg pain, left   . Pure  hypercholesterolemia 12/20/1999  . Anxiety 01/17/2003  . Cerebral atherosclerosis 09/24/1999  . Back pain 08/19/2005    with radiculopathy  . Chest pain, atypical   . Diverticulosis of colon 06/07/2003  . External hemorrhoids 04/12/2009  . Malaise and fatigue   . Osteoarthrosis, localized   . Osteoporosis 05/28/2004  . PSVT (paroxysmal supraventricular tachycardia) ([place]) 02/22/2002  . Palpitations 11/16/2001  . Female climacteric state 03/04/2000  . Abnormal weight loss 05/28/2004  . Urinary incontinence 08/18/2007  . Vaginitis, atrophic   . Vertigo, peripheral 10/07/2000  . Hallucination, visual   . Depression with anxiety     Past Surgical History  Procedure Laterality Date  . Shoulder surgery Right   . Knee surgery Left 1990  . Brain surgery  2000    Neurocysticercosis  . Vaginal hysterectomy  1960  . Rotator cuff repair  2000  . Cataract extraction  2013     Medications: Current Outpatient Prescriptions  Medication Sig Dispense Refill  . acetaminophen (TYLENOL) 325 MG tablet Take 650 mg by mouth every 6 (six) hours as needed for mild pain.    [name] apixaban (ELIQUIS) 2.5 MG TABS tablet Take 1 tablet (2.5 mg total) by mouth 2 (two) times daily. 60 tablet 6  . calcium carbonate (TUMS - DOSED IN MG ELEMENTAL CALCIUM) 500 MG chewable tablet Chew 1 tablet by mouth 2 (two) times daily as needed for indigestion or heartburn.    . docusate sodium (COLACE) 100 MG capsule Take 100 mg by mouth 2 (two) times daily.    [name] donepezil (ARICEPT) 10 MG tablet Take 1 tablet (10 mg total) by mouth daily. 30 tablet 2  . hydrochlorothiazide (MICROZIDE) 12.5 MG capsule Take 1 capsule (12.5 mg total) by mouth daily. 90 capsule 3  . Melatonin 3 MG CAPS Take 1 capsule (3 mg total) by mouth at bedtime as needed. (Patient taking differently: Take 3 mg by mouth at bedtime. )  0  . metoprolol tartrate (LOPRESSOR) 25 MG tablet Take 25 mg by mouth 2 (two) times daily. Hold if pulse goes below 45    . omeprazole  (PRILOSEC) 20 MG capsule Take 1 capsule (20 mg total) by mouth daily. 30 capsule 3  . potassium chloride (K-DUR) 10 MEQ tablet Take 1 tablet (10 mEq total) by mouth daily. 30 tablet 2  . risperiDONE (RISPERDAL) 0.25 MG tablet One po qhs x 2 wks, then one half tablet po qhs for 2 wks, then stop 21 tablet 3  . tobramycin (TOBREX) 0.3 % ophthalmic solution Use as directed for shots (related to macular degeneration) into affected eye    . venlafaxine (EFFEXOR) 37.5 MG tablet Take 37.5 mg by mouth 2 (two) times daily.    . hydrALAZINE (APRESOLINE) 10 MG tablet Take 1 tablet (10 mg total) by mouth 2 (two) times daily. 60 tablet 6  . losartan (COZAAR) 100 MG tablet Take 1 tablet (100 mg total) by mouth daily. 90 tablet 3  No current facility-administered medications for this visit.    Allergies: Allergies  Allergen Reactions  . Amiodarone Itching  . Amoxicillin Other (See Comments)    Unknown- ? Upset stomach  . Demerol [Meperidine] Other (See Comments)    Hallucinations  . Tape Rash    Can tear the skin, also    Social History: The patient  reports that she has never smoked. She has never used smokeless tobacco. She reports that she does not drink alcohol or use illicit drugs.   Family History: The patient's family history includes Heart attack in her father; Hypertension in her sister; Polycystic kidney disease in her mother. There is no history of Dementia.   Review of Systems: Please see the history of present illness.   Otherwise, the review of systems is positive for none.   All other systems are reviewed and negative.   Physical Exam: VS:  BP 200/90 mmHg  Pulse 58  Ht 5\' 2"  (1.575 m)  Wt 130 lb 6.4 oz (59.149 kg)  BMI 23.84 kg/m2 .  BMI Body mass index is 23.84 kg/(m^2).  Wt Readings from Last 3 Encounters:  04/29/16 130 lb 6.4 oz (59.149 kg)  04/18/16 129 lb 12.8 oz (58.877 kg)  04/10/16 131 lb 3.2 oz (59.512 kg)    General: Pleasant. Elderly female who is alert and in  no acute distress. She looks younger than her stated age.   HEENT: Normal. Neck: Supple, no JVD, carotid bruits, or masses noted.  Cardiac: Fairly regular rate and rhythm.  No edema.  Respiratory:  Lungs are clear to auscultation bilaterally with normal work of breathing.  GI: Soft and nontender.  MS: No deformity or atrophy. Gait and ROM intact. Skin: Warm and dry. Color is normal.  Neuro:  Strength and sensation are intact and no gross focal deficits noted.  Psych: Alert, appropriate and with normal affect.   LABORATORY DATA:  EKG:  EKG is not ordered today.  Lab Results  Component Value Date   WBC 8.5 02/29/2016   HGB 13.6 04/24/2016   HCT 40.0 04/24/2016   PLT 222 02/29/2016   GLUCOSE 106* 04/24/2016   CHOL 150 09/21/2013   TRIG 303* 09/21/2013   HDL 45 09/21/2013   LDLCALC 44 09/21/2013   ALT 27 03/01/2016   AST 25 03/01/2016   NA 136 04/24/2016   K 3.0* 04/24/2016   CL 94* 04/24/2016   CREATININE 0.80 04/24/2016   BUN 18 04/24/2016   CO2 23 03/01/2016   TSH 4.447 03/01/2016    BNP (last 3 results)  Recent Labs  02/29/16 1713  BNP 607.6*    ProBNP (last 3 results) No results for input(s): PROBNP in the last 8760 hours.   Other Studies Reviewed Today:  Echo Study Conclusions from 02/2016  - Left ventricle: The cavity size was normal. Wall thickness was  increased in a pattern of moderate LVH. Systolic function was  normal. The estimated ejection fraction was in the range of 50%  to 55%. Wall motion was normal; there were no regional wall  motion abnormalities. The study is not technically sufficient to  allow evaluation of LV diastolic function. - Aortic valve: There was mild regurgitation. - Mitral valve: Moderately calcified annulus. Mildly thickened  leaflets . There was mild regurgitation. - Pulmonary arteries: Systolic pressure was mildly increased. PA  peak pressure: 39 mm Hg (S).  Assessment/Plan: 1. Uncontrolled HTN - BP log is  reviewed - several readings over [id number] systolic. Adding low dose Hydralazine  at 10 mg BID.   2. Probable ACE allergy - changing to Losartan 100 mg a day - recheck later this week as planned to see if improving.   3. PAF - managed conservatively - remains on anticoagulation and low dose beta blocker. Not able to have further increase in AV nodal blocking agents. I would hesitate from cardioversion since she has PAF and goes back and forth and will not be placed on AAD therapy. Has not wanted to have PPM placed as well.   4. Chronic anticoagulation - no falls reported.    5. Hypokalemia - Gutierrez rechecking BMET today.   Current medicines are reviewed with the patient today.  The patient does not have concerns regarding medicines other than what has been noted above.  The following changes have been made:  See above.  Labs/ tests ordered today include:    Orders Placed This Encounter  Procedures  . Basic metabolic panel     Disposition:   FU with Dr. Gifford Shave as planned later this week.   Patient is agreeable to this plan and will call if any problems develop in the interim.   Signed: Burtis Junes, RN, ANP-C 04/29/2016 3:29 PM  [facility] [address] [place], [place]  [address] Phone: [phone] Fax: [phone]

## 2016-04-30 DIAGNOSIS — R079 Chest pain, unspecified: Secondary | ICD-10-CM | POA: Diagnosis not present

## 2016-04-30 DIAGNOSIS — R0789 Other chest pain: Secondary | ICD-10-CM | POA: Diagnosis not present

## 2016-05-01 ENCOUNTER — Inpatient Hospital Stay (HOSPITAL_COMMUNITY)
Admission: EM | Admit: 2016-05-01 | Discharge: 2016-05-03 | DRG: 287 | Disposition: A | Discharge status: Home / Self Care | Payer: Medicare Other | Attending: Cardiovascular Disease | Admitting: Cardiovascular Disease

## 2016-05-01 ENCOUNTER — Telehealth: Payer: Self-pay | Admitting: Nurse Practitioner

## 2016-05-01 DIAGNOSIS — M1990 Unspecified osteoarthritis, unspecified site: Secondary | ICD-10-CM | POA: Diagnosis present

## 2016-05-01 DIAGNOSIS — Z96652 Presence of left artificial knee joint: Secondary | ICD-10-CM | POA: Diagnosis present

## 2016-05-01 DIAGNOSIS — I160 Hypertensive urgency: Secondary | ICD-10-CM | POA: Diagnosis not present

## 2016-05-01 DIAGNOSIS — H3530 Unspecified macular degeneration: Secondary | ICD-10-CM | POA: Diagnosis present

## 2016-05-01 DIAGNOSIS — I10 Essential (primary) hypertension: Secondary | ICD-10-CM | POA: Diagnosis present

## 2016-05-01 DIAGNOSIS — Z888 Allergy status to other drugs, medicaments and biological substances status: Secondary | ICD-10-CM

## 2016-05-01 DIAGNOSIS — M810 Age-related osteoporosis without current pathological fracture: Secondary | ICD-10-CM | POA: Diagnosis present

## 2016-05-01 DIAGNOSIS — Z8249 Family history of ischemic heart disease and other diseases of the circulatory system: Secondary | ICD-10-CM

## 2016-05-01 DIAGNOSIS — F0390 Unspecified dementia without behavioral disturbance: Secondary | ICD-10-CM | POA: Diagnosis present

## 2016-05-01 DIAGNOSIS — R079 Chest pain, unspecified: Secondary | ICD-10-CM | POA: Diagnosis present

## 2016-05-01 DIAGNOSIS — Z8271 Family history of polycystic kidney: Secondary | ICD-10-CM

## 2016-05-01 DIAGNOSIS — R0789 Other chest pain: Secondary | ICD-10-CM | POA: Diagnosis not present

## 2016-05-01 DIAGNOSIS — Z79899 Other long term (current) drug therapy: Secondary | ICD-10-CM

## 2016-05-01 DIAGNOSIS — I480 Paroxysmal atrial fibrillation: Secondary | ICD-10-CM | POA: Diagnosis present

## 2016-05-01 DIAGNOSIS — Z8673 Personal history of transient ischemic attack (TIA), and cerebral infarction without residual deficits: Secondary | ICD-10-CM

## 2016-05-01 DIAGNOSIS — Z841 Family history of disorders of kidney and ureter: Secondary | ICD-10-CM

## 2016-05-01 DIAGNOSIS — I495 Sick sinus syndrome: Secondary | ICD-10-CM | POA: Diagnosis present

## 2016-05-01 DIAGNOSIS — K219 Gastro-esophageal reflux disease without esophagitis: Secondary | ICD-10-CM | POA: Diagnosis present

## 2016-05-01 DIAGNOSIS — I2721 Secondary pulmonary arterial hypertension: Secondary | ICD-10-CM | POA: Diagnosis present

## 2016-05-01 DIAGNOSIS — Z9849 Cataract extraction status, unspecified eye: Secondary | ICD-10-CM

## 2016-05-01 DIAGNOSIS — Z7901 Long term (current) use of anticoagulants: Secondary | ICD-10-CM

## 2016-05-01 DIAGNOSIS — E876 Hypokalemia: Secondary | ICD-10-CM | POA: Diagnosis present

## 2016-05-01 DIAGNOSIS — Z91048 Other nonmedicinal substance allergy status: Secondary | ICD-10-CM

## 2016-05-01 DIAGNOSIS — I272 Other secondary pulmonary hypertension: Secondary | ICD-10-CM | POA: Diagnosis not present

## 2016-05-01 DIAGNOSIS — I351 Nonrheumatic aortic (valve) insufficiency: Secondary | ICD-10-CM | POA: Diagnosis present

## 2016-05-01 DIAGNOSIS — Z90710 Acquired absence of both cervix and uterus: Secondary | ICD-10-CM

## 2016-05-01 DIAGNOSIS — Z885 Allergy status to narcotic agent status: Secondary | ICD-10-CM

## 2016-05-01 DIAGNOSIS — Z881 Allergy status to other antibiotic agents status: Secondary | ICD-10-CM

## 2016-05-01 HISTORY — DX: Unspecified dementia, unspecified severity, without behavioral disturbance, psychotic disturbance, mood disturbance, and anxiety: F03.90

## 2016-05-01 HISTORY — DX: Gastro-esophageal reflux disease without esophagitis: K21.9

## 2016-05-01 NOTE — ED Provider Notes (Signed)
CSN: [id number]     Arrival date & time 05/01/16  2312 History  By signing my name below, I, Rowan Blase, attest that this documentation has been prepared under the direction and in the presence of Julianne Rice, MD . Electronically Signed: Rowan Blase, Scribe. 05/01/2016. 11:37 PM.   Chief Complaint  Patient presents with  . Chest Pain   The history is provided by the patient. No language interpreter was used.   HPI Comments:  Brittney Gutierrez is a 82 y.o. female with PMHx of HTN, HLD, TIA, and PSVT who presents to the Emergency Department via EMS complaining of sudden onset central chest pain and "heaviness" onset ~2 hours ago. Pt reports associated tingling throughout her left arm, hand and fingers, and nausea onset with chest pain. She has had intermittent episodes of similar chest pain over the past 3 days which went away without intervention. EMS administered nitroglycerin and aspirin PTA with mild relief. Pt states she did not feel like her heart went out of rhythm tonight. Denies shortness of breath, vomiting, leg swelling, abdominal pain, recent long travel or recent surgeries.    Past Medical History  Diagnosis Date  . Paroxysmal a-fib ([place])   . Hypertension   . Formed visual hallucinations     [name]?  Failed Keppra and Depakote  . Macular degeneration   . Diverticulitis 09/28/2007  . TIA (transient ischemic attack) 2000s?  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 Depression 09/06/2002  . UTI (urinary tract infection)   . Paranoia ([place])   . Bradycardia, drug induced   . Leg pain, left   . Pure hypercholesterolemia 12/20/1999  . Anxiety 01/17/2003  . Cerebral atherosclerosis 09/24/1999  . Back pain 08/19/2005    with radiculopathy  . Chest pain, atypical   . Diverticulosis of colon 06/07/2003  . External hemorrhoids 04/12/2009  . Malaise and fatigue   . Osteoporosis 05/28/2004  . PSVT (paroxysmal supraventricular tachycardia) ([place])  02/22/2002  . Palpitations 11/16/2001  . Female climacteric state 03/04/2000  . Abnormal weight loss 05/28/2004  . Urinary incontinence 08/18/2007  . Vaginitis, atrophic   . Vertigo, peripheral 10/07/2000  . Hallucination, visual   . GERD (gastroesophageal reflux disease)   . Osteoarthrosis, localized   . Arthritis     "a little; qwhere" (5/25/20170  . Dementia     "don't know kind or stage" (05/02/2016)   Past Surgical History  Procedure Laterality Date  . Shoulder arthroscopy w/ rotator cuff repair Right X 3  . Total knee arthroplasty Left 1990  . Cataract extraction  2013  . Vaginal hysterectomy  1960  . Joint replacement    . Brain surgery  2000    Neurocysticercosis ("paras[place]")  . Cataract extraction, bilateral      "not sure if both eyes; feel like it probably was"   Family History  Problem Relation Age of Onset  . Dementia Neg Hx   . Polycystic kidney disease Mother   . Heart attack Father   . Hypertension Sister    Social History  Substance Use Topics  . Smoking status: Never Smoker   . Smokeless tobacco: Never Used  . Alcohol Use: No   OB History    No data available     Review of Systems  Constitutional: Negative for fever and chills.  Eyes: Negative for visual disturbance.  Respiratory: Negative for cough and shortness of breath.   Cardiovascular: Positive for chest pain. Negative for palpitations  and leg swelling.  Gastrointestinal: Positive for nausea. Negative for vomiting and abdominal pain.  Musculoskeletal: Negative for back pain and neck pain.  Skin: Negative for rash and wound.  Neurological: Positive for numbness (tingling). Negative for syncope, weakness, light-headedness and headaches.  All other systems reviewed and are negative.   Allergies  Amiodarone; Amoxicillin; Demerol; and Tape  Home Medications   Prior to Admission medications   Medication Sig Start Date End Date Taking? Authorizing Provider  acetaminophen (TYLENOL) 325 MG tablet  Take 650 mg by mouth every 6 (six) hours as needed for mild pain.   Yes Historical Provider, MD  apixaban (ELIQUIS) 2.5 MG TABS tablet Take 1 tablet (2.5 mg total) by mouth 2 (two) times daily. 03/04/16  Yes Bhavinkumar Bhagat, PA  calcium carbonate (TUMS - DOSED IN MG ELEMENTAL CALCIUM) 500 MG chewable tablet Chew 1 tablet by mouth 2 (two) times daily as needed for indigestion or heartburn.   Yes Historical Provider, MD  docusate sodium (COLACE) 100 MG capsule Take 100 mg by mouth 2 (two) times daily.   Yes Historical Provider, MD  donepezil (ARICEPT) 10 MG tablet Take 1 tablet (10 mg total) by mouth daily. 04/06/16  Yes Tiffany L Reed, DO  hydrALAZINE (APRESOLINE) 10 MG tablet Take 1 tablet (10 mg total) by mouth 2 (two) times daily. 04/29/16  Yes Burtis Junes, NP  hydrochlorothiazide (MICROZIDE) 12.5 MG capsule Take 1 capsule (12.5 mg total) by mouth daily. 04/29/16  Yes Burtis Junes, NP  losartan (COZAAR) 100 MG tablet Take 1 tablet (100 mg total) by mouth daily. 04/29/16  Yes Burtis Junes, NP  Melatonin 3 MG CAPS Take 1 capsule (3 mg total) by mouth at bedtime as needed. Patient taking differently: Take 3 mg by mouth at bedtime.  02/12/16  Yes Lauree Chandler, NP  metoprolol tartrate (LOPRESSOR) 25 MG tablet Take 25 mg by mouth 2 (two) times daily. Hold if pulse goes below 45   Yes Historical Provider, MD  omeprazole (PRILOSEC) 20 MG capsule Take 1 capsule (20 mg total) by mouth daily. 04/18/16  Yes Man Mast X, NP  risperiDONE (RISPERDAL) 0.25 MG tablet One po qhs x 2 wks, then one half tablet po qhs for 2 wks, then stop 04/05/16  Yes Tiffany L Reed, DO  tobramycin (TOBREX) 0.3 % ophthalmic solution Place 1 drop into the left eye See admin instructions. Use drops when receiving macular degeneration shot into left eye four times a day for several days 03/11/16  Yes Historical Provider, MD  venlafaxine (EFFEXOR) 37.5 MG tablet Take 37.5 mg by mouth 2 (two) times daily.   Yes Historical Provider, MD    BP 166/56 mmHg  Pulse 62  Temp(Src) 98.1 F (36.7 C) (Oral)  Resp 18  Ht 5\' 2"  (1.575 m)  Wt 127 lb 6.4 oz (57.788 kg)  BMI 23.30 kg/m2  SpO2 98% Physical Exam  Constitutional: She is oriented to person, place, and time. She appears well-developed and well-nourished. No distress.  HENT:  Head: Normocephalic and atraumatic.  Mouth/Throat: Oropharynx is clear and moist.  Eyes: EOM are normal. Pupils are equal, round, and reactive to light.  Neck: Normal range of motion. Neck supple. No JVD present.  Cardiovascular: Normal rate and regular rhythm.  Exam reveals no gallop and no friction rub.   No murmur heard. Pulmonary/Chest: Effort normal and breath sounds normal. No respiratory distress. She has no wheezes. She has no rales.  Abdominal: Soft. Bowel sounds are normal. She  exhibits no distension and no mass. There is no tenderness. There is no rebound and no guarding.  Musculoskeletal: Normal range of motion. She exhibits no edema or tenderness.  No lower extremity swelling or tenderness. Distal pulses are 3+  Neurological: She is alert and oriented to person, place, and time.  Moves all extremities without deficit. Subjective paresthesias to the entire left upper extremity. Sensation otherwise intact.  Skin: Skin is warm and dry. No rash noted. No erythema.  Psychiatric: She has a normal mood and affect. Her behavior is normal.  Nursing note and vitals reviewed.   ED Course  Procedures  DIAGNOSTIC STUDIES:  Oxygen Saturation is 97% on RA, normal by my interpretation.    COORDINATION OF CARE:  11:35 PM Will order chest x-ray, CBC, CMP and troponin. Discussed treatment plan with pt at bedside and pt agreed to plan.  2:02 AM Chest pain is essentially resolved. Consulted cardiology who will see and admit the pt.  Labs Review Labs Reviewed  COMPREHENSIVE METABOLIC PANEL - Abnormal; Notable for the following:    Sodium 129 (*)    Potassium 3.1 (*)    Chloride 91 (*)     Albumin 3.4 (*)    GFR calc non Af Amer 59 (*)    All other components within normal limits  APTT - Abnormal; Notable for the following:    aPTT 74 (*)    All other components within normal limits  HEPARIN LEVEL (UNFRACTIONATED) - Abnormal; Notable for the following:    Heparin Unfractionated 2.16 (*)    All other components within normal limits  APTT - Abnormal; Notable for the following:    aPTT 64 (*)    All other components within normal limits  HEPARIN LEVEL (UNFRACTIONATED) - Abnormal; Notable for the following:    Heparin Unfractionated 1.48 (*)    All other components within normal limits  LIPID PANEL - Abnormal; Notable for the following:    LDL Cholesterol 120 (*)    All other components within normal limits  HEPARIN LEVEL (UNFRACTIONATED) - Abnormal; Notable for the following:    Heparin Unfractionated 1.10 (*)    All other components within normal limits  APTT - Abnormal; Notable for the following:    aPTT 77 (*)    All other components within normal limits  CBC WITH DIFFERENTIAL/PLATELET  CBC  TROPONIN I  TROPONIN I  MAGNESIUM  CBC  HEMOGLOBIN A1C  APTT  BASIC METABOLIC PANEL  PROTIME-INR  I-STAT TROPOININ, ED    Imaging Review Dg Chest 2 View  05/02/2016  CLINICAL DATA:  Acute onset of central chest pain and heaviness. Initial encounter. EXAM: CHEST  2 VIEW COMPARISON:  Chest radiograph performed 02/29/2016 FINDINGS: The lungs are well-aerated. Minimal bibasilar atelectasis is noted. There is no evidence of pleural effusion or pneumothorax. The heart is normal in size; the mediastinal contour is within normal limits. No acute osseous abnormalities are seen. There is a chronic displaced fracture of the right clavicle. Clips are noted within the right upper quadrant, reflecting prior cholecystectomy. IMPRESSION: Minimal bibasilar atelectasis noted.  Lungs otherwise clear. Electronically Signed   By: Garald Balding M.D.   On: 05/02/2016 00:12   I have personally  reviewed and evaluated these images and lab results as part of my medical decision-making.   EKG Interpretation   Date/Time:  Wednesday May 01 2016 23:17:55 EDT Ventricular Rate:  55 PR Interval:  142 QRS Duration: 112 QT Interval:  484 QTC Calculation: 463 R Axis:   -  24 Text Interpretation:  Sinus bradycardia with Premature atrial complexes  Incomplete right bundle branch block Left ventricular hypertrophy with  repolarization abnormality Cannot rule out Septal infarct , age  undetermined Abnormal ECG Confirmed by Lita Mains  MD, Javaughn Opdahl ([id number]) on  05/01/2016 11:26:18 PM      MDM   Final diagnoses:  Chest pain, unspecified chest pain type    I personally performed the services described in this documentation, which was scribed in my presence. The recorded information has been reviewed and is accurate.   Chest pain is improved after nitroglycerin. Discussed with cardiology and will admit for rule out and stress testing.  Julianne Rice, MD 05/03/16 [id number]

## 2016-05-01 NOTE — Telephone Encounter (Signed)
I spoke with the patient's daughter, Brittney Gutierrez ([place]). She reports that the patient took her 1st dose of hydralazine on Monday night. She started to complain of her chest hurting that evening. She took her hydralazine and losartan as directed yesterday. However, she complained of her chest hurting and arm tingling yesterday, therefore, she called EMS twice. EKG's were done both times, but she was told they were "normal." Per Brittney Gutierrez, the patient's BP was "ok for her" yesterday. This morning, her BP is 184/76 HR- 61 this morning prior to her medications. The patient usually has her medications about 8:30 am- per her daughter, but she is not with her currently. The patient has been calling her this morning reporting continued chest hurting/ arm tingling. I discussed with Brittney Merle, NP- she is uncertain what the symptoms are related to, but will have the patient hold hydralazine today and continue losartan. I have notified Brittney Gutierrez of Brittney Gutierrez's recommendations. I advised if the patient has already had her morning dose of hydralazine, then she should hold her dose tonight and in the AM. She should take losartan as prescribed. Brittney Gutierrez is aware to bring the patient's BP readings over the last few days and her BP cuff to the office when she sees Brittney Gutierrez, [place] on 5/25. Brittney Gutierrez is agreeable with the above plan and verbalizes understanding.

## 2016-05-01 NOTE — ED Notes (Signed)
CP ongoing for past 3-4 days, L-sided radiating down L arm. Denies SOB, lightheadedness, weakness. EMS gave 324mg  ASA and 3 shots of nitro with little or no relief. Rates pain as 10/10 for EMS but was very calm for EMS on scene and en route, ambulating w/out difficulty.

## 2016-05-01 NOTE — Telephone Encounter (Signed)
New message     Pt recently saw Cecille Rubin.  She had medication changes.   Pt has called EMS twice yesterday c/o chest hurting and arm tingling; ekg was normal on both visits.  Daughter is not at patients home but want to talk to someone to see if her medication changes could be causing this.  Pt told daughter she did not sleep at all last night.  Please advise

## 2016-05-02 ENCOUNTER — Encounter (HOSPITAL_COMMUNITY): Payer: Self-pay | Admitting: Emergency Medicine

## 2016-05-02 ENCOUNTER — Emergency Department (HOSPITAL_COMMUNITY): Payer: Medicare Other

## 2016-05-02 ENCOUNTER — Ambulatory Visit: Payer: Medicare Other | Admitting: Physician Assistant

## 2016-05-02 DIAGNOSIS — Z7901 Long term (current) use of anticoagulants: Secondary | ICD-10-CM | POA: Diagnosis not present

## 2016-05-02 DIAGNOSIS — Z96652 Presence of left artificial knee joint: Secondary | ICD-10-CM | POA: Diagnosis present

## 2016-05-02 DIAGNOSIS — Z9849 Cataract extraction status, unspecified eye: Secondary | ICD-10-CM | POA: Diagnosis not present

## 2016-05-02 DIAGNOSIS — Z91048 Other nonmedicinal substance allergy status: Secondary | ICD-10-CM | POA: Diagnosis not present

## 2016-05-02 DIAGNOSIS — R079 Chest pain, unspecified: Secondary | ICD-10-CM | POA: Diagnosis not present

## 2016-05-02 DIAGNOSIS — Z881 Allergy status to other antibiotic agents status: Secondary | ICD-10-CM | POA: Diagnosis not present

## 2016-05-02 DIAGNOSIS — I272 Other secondary pulmonary hypertension: Secondary | ICD-10-CM | POA: Diagnosis present

## 2016-05-02 DIAGNOSIS — I480 Paroxysmal atrial fibrillation: Secondary | ICD-10-CM | POA: Diagnosis not present

## 2016-05-02 DIAGNOSIS — I351 Nonrheumatic aortic (valve) insufficiency: Secondary | ICD-10-CM | POA: Diagnosis present

## 2016-05-02 DIAGNOSIS — K219 Gastro-esophageal reflux disease without esophagitis: Secondary | ICD-10-CM | POA: Diagnosis present

## 2016-05-02 DIAGNOSIS — Z79899 Other long term (current) drug therapy: Secondary | ICD-10-CM | POA: Diagnosis not present

## 2016-05-02 DIAGNOSIS — Z90710 Acquired absence of both cervix and uterus: Secondary | ICD-10-CM | POA: Diagnosis not present

## 2016-05-02 DIAGNOSIS — M810 Age-related osteoporosis without current pathological fracture: Secondary | ICD-10-CM | POA: Diagnosis present

## 2016-05-02 DIAGNOSIS — M1990 Unspecified osteoarthritis, unspecified site: Secondary | ICD-10-CM | POA: Diagnosis present

## 2016-05-02 DIAGNOSIS — I209 Angina pectoris, unspecified: Secondary | ICD-10-CM

## 2016-05-02 DIAGNOSIS — Z8249 Family history of ischemic heart disease and other diseases of the circulatory system: Secondary | ICD-10-CM | POA: Diagnosis not present

## 2016-05-02 DIAGNOSIS — I10 Essential (primary) hypertension: Secondary | ICD-10-CM | POA: Diagnosis present

## 2016-05-02 DIAGNOSIS — I701 Atherosclerosis of renal artery: Secondary | ICD-10-CM | POA: Diagnosis not present

## 2016-05-02 DIAGNOSIS — H3530 Unspecified macular degeneration: Secondary | ICD-10-CM | POA: Diagnosis present

## 2016-05-02 DIAGNOSIS — Z8271 Family history of polycystic kidney: Secondary | ICD-10-CM | POA: Diagnosis not present

## 2016-05-02 DIAGNOSIS — Z841 Family history of disorders of kidney and ureter: Secondary | ICD-10-CM | POA: Diagnosis not present

## 2016-05-02 DIAGNOSIS — E876 Hypokalemia: Secondary | ICD-10-CM | POA: Diagnosis present

## 2016-05-02 DIAGNOSIS — I495 Sick sinus syndrome: Secondary | ICD-10-CM | POA: Diagnosis present

## 2016-05-02 DIAGNOSIS — Z888 Allergy status to other drugs, medicaments and biological substances status: Secondary | ICD-10-CM | POA: Diagnosis not present

## 2016-05-02 DIAGNOSIS — Z8673 Personal history of transient ischemic attack (TIA), and cerebral infarction without residual deficits: Secondary | ICD-10-CM | POA: Diagnosis not present

## 2016-05-02 DIAGNOSIS — I160 Hypertensive urgency: Secondary | ICD-10-CM | POA: Diagnosis present

## 2016-05-02 DIAGNOSIS — F0390 Unspecified dementia without behavioral disturbance: Secondary | ICD-10-CM | POA: Diagnosis present

## 2016-05-02 DIAGNOSIS — Z885 Allergy status to narcotic agent status: Secondary | ICD-10-CM | POA: Diagnosis not present

## 2016-05-02 LAB — CBC WITH DIFFERENTIAL/PLATELET
Basophils Absolute: 0 10*3/uL (ref 0.0–0.1)
Basophils Relative: 1 %
EOS ABS: 0.1 10*3/uL (ref 0.0–0.7)
EOS PCT: 2 %
HCT: 39.2 % (ref 36.0–46.0)
HEMOGLOBIN: 13.1 g/dL (ref 12.0–15.0)
LYMPHS ABS: 1.6 10*3/uL (ref 0.7–4.0)
LYMPHS PCT: 27 %
MCH: 28.4 pg (ref 26.0–34.0)
MCHC: 33.4 g/dL (ref 30.0–36.0)
MCV: 85 fL (ref 78.0–100.0)
MONO ABS: 0.6 10*3/uL (ref 0.1–1.0)
MONOS PCT: 10 %
NEUTROS PCT: 60 %
Neutro Abs: 3.4 10*3/uL (ref 1.7–7.7)
Platelets: 219 10*3/uL (ref 150–400)
RBC: 4.61 MIL/uL (ref 3.87–5.11)
RDW: 13.5 % (ref 11.5–15.5)
WBC: 5.8 10*3/uL (ref 4.0–10.5)

## 2016-05-02 LAB — LIPID PANEL
CHOLESTEROL: 195 mg/dL (ref 0–200)
HDL: 65 mg/dL (ref >40)
LDL Cholesterol: 120 mg/dL — ABNORMAL HIGH (ref 0–99)
TRIGLYCERIDES: 49 mg/dL (ref <150)
Total CHOL/HDL Ratio: 3 RATIO
VLDL: 10 mg/dL (ref 0–40)

## 2016-05-02 LAB — CBC
HEMATOCRIT: 39.6 % (ref 36.0–46.0)
HEMOGLOBIN: 13.4 g/dL (ref 12.0–15.0)
MCH: 28.5 pg (ref 26.0–34.0)
MCHC: 33.8 g/dL (ref 30.0–36.0)
MCV: 84.3 fL (ref 78.0–100.0)
Platelets: 223 10*3/uL (ref 150–400)
RBC: 4.7 MIL/uL (ref 3.87–5.11)
RDW: 13.5 % (ref 11.5–15.5)
WBC: 5.7 10*3/uL (ref 4.0–10.5)

## 2016-05-02 LAB — COMPREHENSIVE METABOLIC PANEL
ALK PHOS: 75 U/L (ref 38–126)
ALT: 14 U/L (ref 14–54)
ANION GAP: 12 (ref 5–15)
AST: 22 U/L (ref 15–41)
Albumin: 3.4 g/dL — ABNORMAL LOW (ref 3.5–5.0)
BUN: 12 mg/dL (ref 6–20)
CALCIUM: 9.8 mg/dL (ref 8.9–10.3)
CO2: 26 mmol/L (ref 22–32)
Chloride: 91 mmol/L — ABNORMAL LOW (ref 101–111)
Creatinine, Ser: 0.86 mg/dL (ref 0.44–1.00)
GFR calc non Af Amer: 59 mL/min — ABNORMAL LOW (ref >60)
Glucose, Bld: 91 mg/dL (ref 65–99)
POTASSIUM: 3.1 mmol/L — AB (ref 3.5–5.1)
SODIUM: 129 mmol/L — AB (ref 135–145)
TOTAL PROTEIN: 6.8 g/dL (ref 6.5–8.1)
Total Bilirubin: 0.6 mg/dL (ref 0.3–1.2)

## 2016-05-02 LAB — APTT
aPTT: 74 seconds — ABNORMAL HIGH (ref 24–37)
aPTT: 64 seconds — ABNORMAL HIGH (ref 24–37)

## 2016-05-02 LAB — MAGNESIUM: MAGNESIUM: 1.8 mg/dL (ref 1.7–2.4)

## 2016-05-02 LAB — TROPONIN I: Troponin I: <0.03 ng/mL (ref <0.031)

## 2016-05-02 LAB — HEPARIN LEVEL (UNFRACTIONATED)
HEPARIN UNFRACTIONATED: 1.48 [IU]/mL — AB (ref 0.30–0.70)
Heparin Unfractionated: 2.16 IU/mL — ABNORMAL HIGH (ref 0.30–0.70)

## 2016-05-02 LAB — I-STAT TROPONIN, ED: Troponin i, poc: 0 ng/mL (ref 0.00–0.08)

## 2016-05-02 MED ORDER — NITROGLYCERIN 2 % TD OINT
1.0000 [in_us] | TOPICAL_OINTMENT | Freq: Once | TRANSDERMAL | Status: AC
  Administered 2016-05-02: 1 [in_us] via TOPICAL
  Filled 2016-05-02: qty 1

## 2016-05-02 MED ORDER — MELATONIN 3 MG PO CAPS: 3.0000 mg | ORAL_CAPSULE | Freq: Every day | ORAL | Status: DC

## 2016-05-02 MED ORDER — LOSARTAN POTASSIUM 50 MG PO TABS: 100.0000 mg | ORAL_TABLET | Freq: Every day | ORAL | Status: DC

## 2016-05-02 MED ORDER — NITROGLYCERIN 0.4 MG SL SUBL
0.4000 mg | SUBLINGUAL_TABLET | Freq: Once | SUBLINGUAL | Status: AC
  Administered 2016-05-02: 0.4 mg via SUBLINGUAL
  Filled 2016-05-02: qty 1

## 2016-05-02 MED ORDER — GI COCKTAIL ~~LOC~~: 30.0000 mL | Freq: Four times a day (QID) | ORAL | Status: DC | PRN

## 2016-05-02 MED ORDER — SODIUM CHLORIDE 0.9 % IV SOLN: 250.0000 mL | INTRAVENOUS | Status: DC | PRN

## 2016-05-02 MED ORDER — POTASSIUM CHLORIDE ER 10 MEQ PO TBCR
40.0000 meq | EXTENDED_RELEASE_TABLET | ORAL | Status: AC
  Administered 2016-05-02 (×2): 40 meq via ORAL
  Filled 2016-05-02 (×3): qty 4

## 2016-05-02 MED ORDER — SODIUM CHLORIDE 0.9 % WEIGHT BASED INFUSION
3.0000 mL/kg/h | INTRAVENOUS | Status: AC
  Administered 2016-05-03: 3 mL/kg/h via INTRAVENOUS

## 2016-05-02 MED ORDER — ASPIRIN 81 MG PO CHEW
81.0000 mg | CHEWABLE_TABLET | ORAL | Status: AC
  Administered 2016-05-03: 81 mg via ORAL
  Filled 2016-05-02: qty 1

## 2016-05-02 MED ORDER — HEPARIN BOLUS VIA INFUSION
2000.0000 [IU] | Freq: Once | INTRAVENOUS | Status: AC
  Administered 2016-05-02: 2000 [IU] via INTRAVENOUS
  Filled 2016-05-02: qty 2000

## 2016-05-02 MED ORDER — TOBRAMYCIN 0.3 % OP SOLN: 1.0000 [drp] | OPHTHALMIC | Status: DC

## 2016-05-02 MED ORDER — PANTOPRAZOLE SODIUM 40 MG PO TBEC
40.0000 mg | DELAYED_RELEASE_TABLET | Freq: Every day | ORAL | Status: DC
  Administered 2016-05-02 – 2016-05-03 (×2): 40 mg via ORAL
  Filled 2016-05-02 (×2): qty 1

## 2016-05-02 MED ORDER — AMLODIPINE BESYLATE 5 MG PO TABS
5.0000 mg | ORAL_TABLET | Freq: Every day | ORAL | Status: DC
  Administered 2016-05-02 – 2016-05-03 (×2): 5 mg via ORAL
  Filled 2016-05-02 (×2): qty 1

## 2016-05-02 MED ORDER — ACETAMINOPHEN 325 MG PO TABS
650.0000 mg | ORAL_TABLET | ORAL | Status: DC | PRN
  Administered 2016-05-02: 650 mg via ORAL
  Filled 2016-05-02: qty 2

## 2016-05-02 MED ORDER — DONEPEZIL HCL 10 MG PO TABS
10.0000 mg | ORAL_TABLET | Freq: Every day | ORAL | Status: DC
  Administered 2016-05-02 – 2016-05-03 (×2): 10 mg via ORAL
  Filled 2016-05-02 (×2): qty 1

## 2016-05-02 MED ORDER — DOCUSATE SODIUM 100 MG PO CAPS
100.0000 mg | ORAL_CAPSULE | Freq: Two times a day (BID) | ORAL | Status: DC
  Administered 2016-05-02 – 2016-05-03 (×2): 100 mg via ORAL
  Filled 2016-05-02 (×3): qty 1

## 2016-05-02 MED ORDER — ATORVASTATIN CALCIUM 40 MG PO TABS
40.0000 mg | ORAL_TABLET | Freq: Every day | ORAL | Status: DC
  Administered 2016-05-02: 40 mg via ORAL
  Filled 2016-05-02: qty 1

## 2016-05-02 MED ORDER — SODIUM CHLORIDE 0.9% FLUSH: 3.0000 mL | INTRAVENOUS | Status: DC | PRN

## 2016-05-02 MED ORDER — VENLAFAXINE HCL 37.5 MG PO TABS
37.5000 mg | ORAL_TABLET | Freq: Two times a day (BID) | ORAL | Status: DC
  Administered 2016-05-02 (×2): 37.5 mg via ORAL
  Filled 2016-05-02 (×4): qty 1

## 2016-05-02 MED ORDER — ASPIRIN 81 MG PO CHEW
324.0000 mg | CHEWABLE_TABLET | Freq: Once | ORAL | Status: AC
  Administered 2016-05-02: 324 mg via ORAL

## 2016-05-02 MED ORDER — METOPROLOL TARTRATE 25 MG PO TABS
25.0000 mg | ORAL_TABLET | Freq: Two times a day (BID) | ORAL | Status: DC
  Administered 2016-05-02 – 2016-05-03 (×3): 25 mg via ORAL
  Filled 2016-05-02 (×3): qty 1

## 2016-05-02 MED ORDER — HEPARIN (PORCINE) IN NACL 100-0.45 UNIT/ML-% IJ SOLN
800.0000 [IU]/h | INTRAMUSCULAR | Status: DC
  Administered 2016-05-02: 700 [IU]/h via INTRAVENOUS
  Filled 2016-05-02 (×2): qty 250

## 2016-05-02 MED ORDER — LOSARTAN POTASSIUM 50 MG PO TABS
100.0000 mg | ORAL_TABLET | Freq: Every day | ORAL | Status: DC
  Administered 2016-05-02: 100 mg via ORAL
  Filled 2016-05-02 (×2): qty 2

## 2016-05-02 MED ORDER — HYDROCHLOROTHIAZIDE 12.5 MG PO CAPS
12.5000 mg | ORAL_CAPSULE | Freq: Every day | ORAL | Status: DC
Start: 2016-05-02 — End: 2016-05-03
  Administered 2016-05-02 – 2016-05-03 (×2): 12.5 mg via ORAL
  Filled 2016-05-02 (×2): qty 1

## 2016-05-02 MED ORDER — NITROGLYCERIN 0.4 MG SL SUBL
0.4000 mg | SUBLINGUAL_TABLET | Freq: Once | SUBLINGUAL | Status: AC
  Administered 2016-05-02: 0.4 mg via SUBLINGUAL

## 2016-05-02 MED ORDER — SODIUM CHLORIDE 0.9 % WEIGHT BASED INFUSION: 1.0000 mL/kg/h | INTRAVENOUS | Status: DC

## 2016-05-02 MED ORDER — CALCIUM CARBONATE ANTACID 500 MG PO CHEW: 1.0000 | CHEWABLE_TABLET | Freq: Two times a day (BID) | ORAL | Status: DC | PRN

## 2016-05-02 MED ORDER — ONDANSETRON HCL 4 MG/2ML IJ SOLN: 4.0000 mg | Freq: Four times a day (QID) | INTRAMUSCULAR | Status: DC | PRN

## 2016-05-02 MED ORDER — SODIUM CHLORIDE 0.9 % IV SOLN
INTRAVENOUS | Status: DC
  Administered 2016-05-02: 09:00:00 via INTRAVENOUS

## 2016-05-02 MED ORDER — ASPIRIN EC 81 MG PO TBEC
81.0000 mg | DELAYED_RELEASE_TABLET | Freq: Every day | ORAL | Status: DC
  Administered 2016-05-02: 81 mg via ORAL
  Filled 2016-05-02 (×2): qty 1

## 2016-05-02 MED ORDER — METOPROLOL TARTRATE 25 MG PO TABS: 25.0000 mg | ORAL_TABLET | Freq: Two times a day (BID) | ORAL | Status: DC

## 2016-05-02 MED ORDER — ACETAMINOPHEN 325 MG PO TABS
650.0000 mg | ORAL_TABLET | Freq: Four times a day (QID) | ORAL | Status: DC | PRN
  Administered 2016-05-02: 650 mg via ORAL
  Filled 2016-05-02: qty 2

## 2016-05-02 MED ORDER — SODIUM CHLORIDE 0.9% FLUSH: 3.0000 mL | Freq: Two times a day (BID) | INTRAVENOUS | Status: DC

## 2016-05-02 NOTE — Progress Notes (Addendum)
ANTICOAGULATION CONSULT NOTE - Initial Consult  Pharmacy Consult for Heparin Indication: chest pain/ACS  Allergies  Allergen Reactions  . Amiodarone Itching  . Amoxicillin Other (See Comments)    Unknown- ? Upset stomach  . Demerol [Meperidine] Other (See Comments)    Hallucinations  . Tape Rash    Can tear the skin, also    Patient Measurements: Height: 5\' 2"  (157.5 cm) Weight: 130 lb (58.968 kg) IBW/kg (Calculated) : 50.1 Heparin Dosing Weight: 59 kg  Vital Signs: Temp: 98.6 F (37 C) (05/24 2325) Temp Source: Oral (05/24 2325) BP: 171/78 mmHg (05/25 0415) Pulse Rate: 57 (05/25 0400)  Labs:  Recent Labs  04/29/16 1528 05/02/16 0025  HGB  --  13.1  HCT  --  39.2  PLT  --  219  CREATININE 0.84 0.86    Estimated Creatinine Clearance: 37.1 mL/min (by C-G formula based on Cr of 0.86).   Medical History: Past Medical History  Diagnosis Date  . Paroxysmal a-fib ([place])   . Hypertension   . Formed visual hallucinations     [name]?  Failed Keppra and Depakote  . Macular degeneration   . Diverticulitis 09/28/2007  . TIA (transient ischemic attack)   . Neurocysticercosis     Craniotomy at [facility] in early 2000s  . Depression 09/06/2002  . UTI (urinary tract infection)   . Paranoia ([place])   . Bradycardia, drug induced   . Leg pain, left   . Pure hypercholesterolemia 12/20/1999  . Anxiety 01/17/2003  . Cerebral atherosclerosis 09/24/1999  . Back pain 08/19/2005    with radiculopathy  . Chest pain, atypical   . Diverticulosis of colon 06/07/2003  . External hemorrhoids 04/12/2009  . Malaise and fatigue   . Osteoarthrosis, localized   . Osteoporosis 05/28/2004  . PSVT (paroxysmal supraventricular tachycardia) ([place]) 02/22/2002  . Palpitations 11/16/2001  . Female climacteric state 03/04/2000  . Abnormal weight loss 05/28/2004  . Urinary incontinence 08/18/2007  . Vaginitis, atrophic   . Vertigo, peripheral 10/07/2000  . Hallucination, visual   . Depression with  anxiety     Medications:   (Not in a hospital admission) Scheduled:  . aspirin  324 mg Oral Once   Infusions:    Assessment: 82yo female with history of Afib presents with CP. Pharmacy is consulted to dose heparin for ACS/chest pain. Pt is on apixaban PTA but last dose was 5/23. Will give half bolus.  Goal of Therapy:  Heparin level 0.3-0.7 units/ml aPTT 66-102 seconds Monitor platelets by anticoagulation protocol: Yes   Plan:  Give 2000 units bolus x 1 Start heparin infusion at 700 units/hr Check anti-Xa level in 8 hours and daily while on heparin Continue to monitor H&H and platelets  Andrey Cota. Diona Foley, PharmD, BCPS Clinical Pharmacist Pager [phone] 05/02/2016,4:32 AM

## 2016-05-02 NOTE — Progress Notes (Signed)
ANTICOAGULATION CONSULT NOTE - Follow Up Consult  Pharmacy Consult for Heparin Indication: chest pain/ACS  Allergies  Allergen Reactions  . Amiodarone Itching  . Amoxicillin Other (See Comments)    Unknown- ? Upset stomach  . Demerol [Meperidine] Other (See Comments)    Hallucinations  . Tape Rash    Can tear the skin, also    Patient Measurements: Height: 5\' 2"  (157.5 cm) Weight: 127 lb 12.8 oz (57.97 kg) IBW/kg (Calculated) : 50.1 Heparin Dosing Weight: 59 kg  Vital Signs: Temp: 97.9 F (36.6 C) (05/25 0505) Temp Source: Oral (05/25 0505) BP: 190/69 mmHg (05/25 1009) Pulse Rate: 50 (05/25 1009)  Labs:  Recent Labs  05/02/16 0025 05/02/16 0732 05/02/16 1305 05/02/16 1511 05/02/16 1550  HGB 13.1 13.4  --   --   --   HCT 39.2 39.6  --   --   --   PLT 219 223  --   --   --   APTT  --  74*  --  64*  --   HEPARINUNFRC  --  2.16*  --   --  1.48*  CREATININE 0.86  --   --   --   --   TROPONINI  --  <0.03 <0.03  --   --     Estimated Creatinine Clearance: 37.1 mL/min (by C-G formula based on Cr of 0.86).   Medical History: Past Medical History  Diagnosis Date  . Paroxysmal a-fib ([place])   . Hypertension   . Formed visual hallucinations     [name]?  Failed Keppra and Depakote  . Macular degeneration   . Diverticulitis 09/28/2007  . TIA (transient ischemic attack) 2000s?  [name] Neurocysticercosis     Craniotomy at [facility] in early 2000s  . Depression 09/06/2002  . UTI (urinary tract infection)   . Paranoia ([place])   . Bradycardia, drug induced   . Leg pain, left   . Pure hypercholesterolemia 12/20/1999  . Anxiety 01/17/2003  . Cerebral atherosclerosis 09/24/1999  . Back pain 08/19/2005    with radiculopathy  . Chest pain, atypical   . Diverticulosis of colon 06/07/2003  . External hemorrhoids 04/12/2009  . Malaise and fatigue   . Osteoporosis 05/28/2004  . PSVT (paroxysmal supraventricular tachycardia) ([place]) 02/22/2002  . Palpitations 11/16/2001  . Female  climacteric state 03/04/2000  . Abnormal weight loss 05/28/2004  . Urinary incontinence 08/18/2007  . Vaginitis, atrophic   . Vertigo, peripheral 10/07/2000  . Hallucination, visual   . GERD (gastroesophageal reflux disease)   . Osteoarthrosis, localized   . Arthritis     "a little; qwhere" (5/25/20170  . Dementia     "don't know kind or stage" (05/02/2016)    Medications:  Prescriptions prior to admission  Medication Sig Dispense Refill Last Dose  . acetaminophen (TYLENOL) 325 MG tablet Take 650 mg by mouth every 6 (six) hours as needed for mild pain.   Past Week at Unknown time  . apixaban (ELIQUIS) 2.5 MG TABS tablet Take 1 tablet (2.5 mg total) by mouth 2 (two) times daily. 60 tablet 6 04/30/2016 at Unknown time  . calcium carbonate (TUMS - DOSED IN MG ELEMENTAL CALCIUM) 500 MG chewable tablet Chew 1 tablet by mouth 2 (two) times daily as needed for indigestion or heartburn.   Past Month at Unknown time  . docusate sodium (COLACE) 100 MG capsule Take 100 mg by mouth 2 (two) times daily.   04/30/2016 at Unknown time  . donepezil (ARICEPT) 10 MG tablet Take  1 tablet (10 mg total) by mouth daily. 30 tablet 2 04/30/2016 at Unknown time  . hydrALAZINE (APRESOLINE) 10 MG tablet Take 1 tablet (10 mg total) by mouth 2 (two) times daily. 60 tablet 6 04/30/2016 at am  . hydrochlorothiazide (MICROZIDE) 12.5 MG capsule Take 1 capsule (12.5 mg total) by mouth daily. 90 capsule 3 04/30/2016 at Unknown time  . losartan (COZAAR) 100 MG tablet Take 1 tablet (100 mg total) by mouth daily. 90 tablet 3 04/30/2016 at Unknown time  . Melatonin 3 MG CAPS Take 1 capsule (3 mg total) by mouth at bedtime as needed. (Patient taking differently: Take 3 mg by mouth at bedtime. )  0 04/30/2016 at Unknown time  . metoprolol tartrate (LOPRESSOR) 25 MG tablet Take 25 mg by mouth 2 (two) times daily. Hold if pulse goes below 45   04/30/2016 at 2030  . omeprazole (PRILOSEC) 20 MG capsule Take 1 capsule (20 mg total) by mouth daily.  30 capsule 3 04/30/2016 at Unknown time  . risperiDONE (RISPERDAL) 0.25 MG tablet One po qhs x 2 wks, then one half tablet po qhs for 2 wks, then stop 21 tablet 3 04/30/2016 at Unknown time  . tobramycin (TOBREX) 0.3 % ophthalmic solution Place 1 drop into the left eye See admin instructions. Use drops when receiving macular degeneration shot into left eye four times a day for several days   unk  . venlafaxine (EFFEXOR) 37.5 MG tablet Take 37.5 mg by mouth 2 (two) times daily.   04/30/2016 at Unknown time   Scheduled:  . amLODipine  5 mg Oral Daily  . aspirin EC  81 mg Oral Daily  . atorvastatin  40 mg Oral q1800  . docusate sodium  100 mg Oral BID  . donepezil  10 mg Oral Daily  . hydrochlorothiazide  12.5 mg Oral Daily  . losartan  100 mg Oral Daily  . metoprolol tartrate  25 mg Oral BID  . pantoprazole  40 mg Oral Daily  . venlafaxine  37.5 mg Oral BID   Infusions:  . sodium chloride 20 mL/hr at 05/02/16 0846  . heparin 700 Units/hr (05/02/16 [id number])    Assessment: 82yo female with history of Afib presents with CP. Currently on IV heparin at 700 units/hr. APTT is sub-therapeutic at 64. HL remains falsely elevated at 1.48. RN reports no s/s of bleeding.   Goal of Therapy:  Heparin level 0.3-0.7 units/ml aPTT 66-102 seconds Monitor platelets by anticoagulation protocol: Yes   Plan:  Increase heparin infusion to 800 units/hr Check anti-Xa level in 8 hours and daily while on heparin Continue to monitor H&H and platelets  Albertina Parr, PharmD., BCPS Clinical Pharmacist Pager [phone]

## 2016-05-02 NOTE — Progress Notes (Addendum)
Per family her last dose of eliquis was 8pm on 5/24, will schedule cath for tomorrow, will also do renal artery angiography as well given her uncontrolled HTN  Signed, Almyra Deforest PA Pager: [phone]   Risk and benefit of procedure explained to the patient who display clear understanding and agree to proceed.  Discussed with patient possible procedural risk include bleeding, vascular injury, renal injury, arrythmia, MI, stroke and loss of limb or life.  Hilbert Corrigan PA Pager: [phone]

## 2016-05-02 NOTE — H&P (Signed)
History & Physical    Patient ID: Brittney Gutierrez MRN: [id number], DOB/AGE: 80-Aug-1928   Admit date: 05/01/2016   Primary Physician: Lauree Chandler, NP Primary Cardiologist: Dr. Sallyanne Kuster  Patient Profile    [phone] with pAF on apixaban, SSS without PPM (due to patient preference), poorly controlled HTN, ?prior TIA, and mild dementia who presents with CP.   Past Medical History    Past Medical History  Diagnosis Date  . Paroxysmal a-fib ([place])   . Hypertension   . Formed visual hallucinations     [name]?  Failed Keppra and Depakote  . Macular degeneration   . Diverticulitis 09/28/2007  . TIA (transient ischemic attack)   . Neurocysticercosis     Craniotomy at [facility] in early 2000s  . Depression 09/06/2002  . UTI (urinary tract infection)   . Paranoia ([place])   . Bradycardia, drug induced   . Leg pain, left   . Pure hypercholesterolemia 12/20/1999  . Anxiety 01/17/2003  . Cerebral atherosclerosis 09/24/1999  . Back pain 08/19/2005    with radiculopathy  . Chest pain, atypical   . Diverticulosis of colon 06/07/2003  . External hemorrhoids 04/12/2009  . Malaise and fatigue   . Osteoarthrosis, localized   . Osteoporosis 05/28/2004  . PSVT (paroxysmal supraventricular tachycardia) ([place]) 02/22/2002  . Palpitations 11/16/2001  . Female climacteric state 03/04/2000  . Abnormal weight loss 05/28/2004  . Urinary incontinence 08/18/2007  . Vaginitis, atrophic   . Vertigo, peripheral 10/07/2000  . Hallucination, visual   . Depression with anxiety     Past Surgical History  Procedure Laterality Date  . Shoulder surgery Right   . Knee surgery Left 1990  . Brain surgery  2000    Neurocysticercosis  . Vaginal hysterectomy  1960  . Rotator cuff repair  2000  . Cataract extraction  2013     Allergies  Allergies  Allergen Reactions  . Amiodarone Itching  . Amoxicillin Other (See Comments)    Unknown- ? Upset stomach  . Demerol [Meperidine] Other (See Comments)   Hallucinations  . Tape Rash    Can tear the skin, also    History of Present Illness    31F with pAF on apixaban, SSS without PPM (due to patient preference), poorly controlled HTN, ?prior TIA, and mild dementia who presents with CP.   [name] was seen by Truitt Merle, NP in the office on 5/22 because she was dizzy and not feeling well. There was concern for ACE intolerance and it was transitioned to an ARB. She also reported some chest tightness and blurry vision at times. No syncope. Her BPs had been poorly controled, usually in the 190-200 range. Hydralazine was added to her regimen. The following day, the patient experienced chest pain and arm tingling. She called EMS x 2; each time, they apparently checked an ECG, reported it was normal, and did not transport her to the hospital.  She was scheduled for an early follow-up on 5/25. Hydral was stopped the morning of 5/24. K supplementation was also stopped out of concern it was causing loose stools.   At around 9:30 on 5/24, while getting ready for bed, [name] experienced a recurrence of severe CP and left arm pain. 8/10 in severity. There was associated nausea without vomiting. Symptoms were not exertional or pleuritic. Palpation somewhat reproduced the symptoms. EMS was called and transferred the patient to the hospital. She was given ASA 324mg  and SL NTG x 3 with some symptomatic relief.  On arrival to the ER, VS notable for HR 55 (SB), RR 18, 97% on RA, 174/72. CP was minimal. Labs were notable for K 3.1, Cr 0.86, POC TnI was normal. ECG demonstrated  SB with PAC, LVH with repol. CXR demonstrated clear lungs. While in ER she had a recurrence of severe chest pain and was given SL NTG and a nitro patch with improvement in her symptoms.    Home Medications    Prior to Admission medications   Medication Sig Start Date End Date Taking? Authorizing Provider  acetaminophen (TYLENOL) 325 MG tablet Take 650 mg by mouth every 6 (six)  hours as needed for mild pain.   Yes Historical Provider, MD  apixaban (ELIQUIS) 2.5 MG TABS tablet Take 1 tablet (2.5 mg total) by mouth 2 (two) times daily. 03/04/16  Yes Bhavinkumar Bhagat, PA  calcium carbonate (TUMS - DOSED IN MG ELEMENTAL CALCIUM) 500 MG chewable tablet Chew 1 tablet by mouth 2 (two) times daily as needed for indigestion or heartburn.   Yes Historical Provider, MD  docusate sodium (COLACE) 100 MG capsule Take 100 mg by mouth 2 (two) times daily.   Yes Historical Provider, MD  donepezil (ARICEPT) 10 MG tablet Take 1 tablet (10 mg total) by mouth daily. 04/06/16  Yes Tiffany L Reed, DO  hydrALAZINE (APRESOLINE) 10 MG tablet Take 1 tablet (10 mg total) by mouth 2 (two) times daily. 04/29/16  Yes Burtis Junes, NP  hydrochlorothiazide (MICROZIDE) 12.5 MG capsule Take 1 capsule (12.5 mg total) by mouth daily. 04/29/16  Yes Burtis Junes, NP  losartan (COZAAR) 100 MG tablet Take 1 tablet (100 mg total) by mouth daily. 04/29/16  Yes Burtis Junes, NP  Melatonin 3 MG CAPS Take 1 capsule (3 mg total) by mouth at bedtime as needed. Patient taking differently: Take 3 mg by mouth at bedtime.  02/12/16  Yes Lauree Chandler, NP  metoprolol tartrate (LOPRESSOR) 25 MG tablet Take 25 mg by mouth 2 (two) times daily. Hold if pulse goes below 45   Yes Historical Provider, MD  omeprazole (PRILOSEC) 20 MG capsule Take 1 capsule (20 mg total) by mouth daily. 04/18/16  Yes Man Mast X, NP  risperiDONE (RISPERDAL) 0.25 MG tablet One po qhs x 2 wks, then one half tablet po qhs for 2 wks, then stop 04/05/16  Yes Tiffany L Reed, DO  tobramycin (TOBREX) 0.3 % ophthalmic solution Place 1 drop into the left eye See admin instructions. Use drops when receiving macular degeneration shot into left eye four times a day for several days 03/11/16  Yes Historical Provider, MD  venlafaxine (EFFEXOR) 37.5 MG tablet Take 37.5 mg by mouth 2 (two) times daily.   Yes Historical Provider, MD    Family History    Family  History  Problem Relation Age of Onset  . Dementia Neg Hx   . Polycystic kidney disease Mother   . Heart attack Father   . Hypertension Sister     Social History    Social History   Social History  . Marital Status: Widowed    Spouse Name: N/A  . Number of Children: N/A  . Years of Education: N/A   Occupational History  . Not on file.   Social History Main Topics  . Smoking status: Never Smoker   . Smokeless tobacco: Never Used  . Alcohol Use: No  . Drug Use: No  . Sexual Activity: Not on file   Other Topics Concern  . Not  on file   Social History Narrative   DIET: None      DO YOU DRINK/EAT THINGS WITH CAFFEINE: yes      MARITAL STATUS: widow      WHAT YEAR WERE YOU MARRIED: 1948      DO YOU LIVE IN A HOUSE, APARTMENT, ASSISTED LIVING, CONDO TRAILER ETC.: Independent Living      IS IT ONE OR MORE STORIES: 1 story      HOW MANY PERSONS LIVE IN YOUR HOME: 1      DO YOU HAVE PETS IN YOUR HOME: no      CURRENT OR PAST PROFESSION: Book Keeper      DO YOU EXERCISE: no      WHAT TYPE AND HOW OFTEN:     Review of Systems    General:  No chills, fever, night sweats or weight changes.  Cardiovascular:  No dyspnea on exertion, edema, orthopnea, palpitations, paroxysmal nocturnal dyspnea. Dermatological: No rash, lesions/masses Respiratory: No cough, dyspnea Urologic: No hematuria, dysuria Abdominal:   No nausea, vomiting, diarrhea, bright red blood per rectum, melena, or hematemesis Neurologic:  No visual changes, wkns, changes in mental status. All other systems reviewed and are otherwise negative except as noted above.  Physical Exam    Blood pressure 174/72, pulse 55, temperature 98.6 F (37 C), temperature source Oral, resp. rate 18, height 5\' 2"  (1.575 m), weight 58.968 kg (130 lb), SpO2 100 %.  General: Pleasant, NAD, elderly Psych: Normal affect. Neuro: Alert Moves all extremities spontaneously. HEENT: Normal  Neck: Supple without bruits or  JVD. Lungs:  Resp regular and unlabored, CTA. Heart: RRR no s3, s4, or murmurs. Abdomen: Soft, non-tender, non-distended, BS + x 4.  Extremities: No clubbing, cyanosis or edema. DP/PT/Radials 2+ and equal bilaterally.  Labs    Troponin [facility] of Care Test)  Recent Labs  05/02/16 0028  TROPIPOC 0.00   No results for input(s): CKTOTAL, CKMB, TROPONINI in the last 72 hours. Lab Results  Component Value Date   WBC 5.8 05/02/2016   HGB 13.1 05/02/2016   HCT 39.2 05/02/2016   MCV 85.0 05/02/2016   PLT 219 05/02/2016    Recent Labs Lab 05/02/16 0025  NA 129*  K 3.1*  CL 91*  CO2 26  BUN 12  CREATININE 0.86  CALCIUM 9.8  PROT 6.8  BILITOT 0.6  ALKPHOS 75  ALT 14  AST 22  GLUCOSE 91   Lab Results  Component Value Date   CHOL 150 09/21/2013   HDL 45 09/21/2013   LDLCALC 44 09/21/2013   TRIG 303* 09/21/2013   No results found for: [facility]   Radiology Studies    Dg Chest 2 View  05/02/2016  CLINICAL DATA:  Acute onset of central chest pain and heaviness. Initial encounter. EXAM: CHEST  2 VIEW COMPARISON:  Chest radiograph performed 02/29/2016 FINDINGS: The lungs are well-aerated. Minimal bibasilar atelectasis is noted. There is no evidence of pleural effusion or pneumothorax. The heart is normal in size; the mediastinal contour is within normal limits. No acute osseous abnormalities are seen. There is a chronic displaced fracture of the right clavicle. Clips are noted within the right upper quadrant, reflecting prior cholecystectomy. IMPRESSION: Minimal bibasilar atelectasis noted.  Lungs otherwise clear. Electronically Signed   By: Garald Balding M.D.   On: 05/02/2016 00:12    ECG & Cardiac Imaging    03/01/16 TTE - Left ventricle: The cavity size was normal. Wall thickness was  increased in a pattern of  moderate LVH. Systolic function was  normal. The estimated ejection fraction was in the range of 50%  to 55%. Wall motion was normal; there were no regional  wall  motion abnormalities. The study is not technically sufficient to  allow evaluation of LV diastolic function. - Aortic valve: There was mild regurgitation. - Mitral valve: Moderately calcified annulus. Mildly thickened  leaflets . There was mild regurgitation. - Pulmonary arteries: Systolic pressure was mildly increased. PA  peak pressure: 39 mm Hg (S).   ECG 01-May-2016 23:17:55 demonstrated SB with PAC, LVH with repol  Assessment & Plan    48F with pAF on apixaban, SSS without PPM (due to patient preference), poorly controlled HTN, ?prior TIA, and mild dementia who presents with CP. She has had several episodes of recurrent severe typical rest pain. The main atypical feature is the mild ability to reproduce some of the symptoms with palpation.  On the whole, the story is concerning enough to consider proceeding directly to cath, even if biomarkers do not turn positive. I spoke with the patient and daughter at length. Although they both confirmed full code status, the patient is very hesitant to undergo a cardiac cath. She is willing to discuss further in the AM.   Chest pain - cycle troponins - continue nitro patch; transition to gtt if needed to control CP - UFH per pharmacy (hold apixaban)  - ASA - metoprolol 25mg  BID, hold for HR <45 - atorva 80mg  - A1c, lipids - NPO for stress vs cath in AM  HTN, historically with poor control - hold hydral - continue HCTZ 12.5mg  daily - continue cozaar 100mg  daily - metoprolol 25mg  BID, hold for HR <45  pAF, currently in sinus - hold apixaban in anticipation of UFH gtt - metoprolol 25mg  BID, hold for HR <45  Hypokalemia - 60meq x 2 - Check Mg  Signed, Lamar Sprinkles, MD 05/02/2016, 2:06 AM

## 2016-05-02 NOTE — Progress Notes (Signed)
Interval coverage note:  Primary Cardiologist: Dr. Sallyanne Kuster  82 yo female with PMH of PAF on eliquis, SSS without PPM (due to patient preference), poorly controlled HTN, ?prior TIA and mild dementia who presented with CP. Normal EF on echo 3/17. Reportedly normal myoview in [place] 08/2015. She was seen in the office on 5/22 for dizziness and malaise. BP was 190-200. Her ACEI was switched to ARB. Hydralazine added. She took hydralazine then had chest pain. She refused EMS taking her to the hospital twice. She had another episode of chest pain in the afternoon of 5/24 and subsequently presented to the hospital again. Trop negative x 1    Subjective No chest pain since yesterday night. No SOB    Physical exam:  Heart: RRR, no murmur Lung: CTA, no rhonchi, wheezing or rale General: A&Ox3. NAD Abdomen: soft nontender Extremities: no edema   Plan:  1. Recurrent chest pain at rest  - EKG shows LVH with repol, minimal ST depression in II and aVF likely part of repol  - Echo 03/01/2016 EF 50-55%, moderate LVH  - will discuss with MD regarding myoview vs cath  2. PAF on eliquis, currently in sinus  - CHA2DS2-Vasc score of at least 4 (if + TIA would be 7)  - eliquis on hold  3. SSS without PPM (due to patient preference)  4. poorly controlled HTN: still elevated, will increase HCTZ. No more uptitration of AV nodal blocking agent due to underlying bradycardia   Hilbert Corrigan PA Pager: [phone]  Agree with note by Almyra Deforest PA-C  Pt with recurrent CP, PAF and resistant HTN on Eliquis. EKG shows LVH with repol changes. Exam benign. Pt agreeable to cor angio. Will also need abd AoGram to visualize renal arteries.  Lorretta Harp, M.D., [place], [facility], [place] [address] [place], [place]  [address]  [phone] 05/02/2016 1:36 PM

## 2016-05-03 ENCOUNTER — Encounter (HOSPITAL_COMMUNITY): Payer: Self-pay | Admitting: Physician Assistant

## 2016-05-03 ENCOUNTER — Encounter (HOSPITAL_COMMUNITY)
Admission: EM | Disposition: A | Discharge status: Home / Self Care | Payer: Self-pay | Source: Home / Self Care | Attending: Cardiovascular Disease

## 2016-05-03 DIAGNOSIS — I701 Atherosclerosis of renal artery: Secondary | ICD-10-CM

## 2016-05-03 DIAGNOSIS — I10 Essential (primary) hypertension: Secondary | ICD-10-CM | POA: Insufficient documentation

## 2016-05-03 HISTORY — PX: CARDIAC CATHETERIZATION: SHX172

## 2016-05-03 HISTORY — PX: PERIPHERAL VASCULAR CATHETERIZATION: SHX172C

## 2016-05-03 LAB — CBC
HEMATOCRIT: 37.9 % (ref 36.0–46.0)
Hemoglobin: 12.9 g/dL (ref 12.0–15.0)
MCH: 29 pg (ref 26.0–34.0)
MCHC: 34 g/dL (ref 30.0–36.0)
MCV: 85.2 fL (ref 78.0–100.0)
Platelets: 240 10*3/uL (ref 150–400)
RBC: 4.45 MIL/uL (ref 3.87–5.11)
RDW: 13.2 % (ref 11.5–15.5)
WBC: 5.9 10*3/uL (ref 4.0–10.5)

## 2016-05-03 LAB — BASIC METABOLIC PANEL
Anion gap: 9 (ref 5–15)
BUN: 9 mg/dL (ref 6–20)
CHLORIDE: 96 mmol/L — AB (ref 101–111)
CO2: 27 mmol/L (ref 22–32)
CREATININE: 0.92 mg/dL (ref 0.44–1.00)
Calcium: 9.2 mg/dL (ref 8.9–10.3)
GFR calc Af Amer: >60 mL/min (ref >60)
GFR calc non Af Amer: 55 mL/min — ABNORMAL LOW (ref >60)
Glucose, Bld: 98 mg/dL (ref 65–99)
Potassium: 3.7 mmol/L (ref 3.5–5.1)
SODIUM: 132 mmol/L — AB (ref 135–145)

## 2016-05-03 LAB — PROTIME-INR
INR: 1.08 (ref 0.00–1.49)
Prothrombin Time: 14.2 seconds (ref 11.6–15.2)

## 2016-05-03 LAB — HEPARIN LEVEL (UNFRACTIONATED): HEPARIN UNFRACTIONATED: 1.1 [IU]/mL — AB (ref 0.30–0.70)

## 2016-05-03 LAB — APTT
aPTT: 77 seconds — ABNORMAL HIGH (ref 24–37)
aPTT: 191 seconds — ABNORMAL HIGH (ref 24–37)

## 2016-05-03 LAB — HEMOGLOBIN A1C
HEMOGLOBIN A1C: 6.2 % — AB (ref 4.8–5.6)
MEAN PLASMA GLUCOSE: 131 mg/dL

## 2016-05-03 SURGERY — LEFT HEART CATH AND CORONARY ANGIOGRAPHY

## 2016-05-03 MED ORDER — MIDAZOLAM HCL 2 MG/2ML IJ SOLN
INTRAMUSCULAR | Status: DC | PRN
  Administered 2016-05-03: 1 mg via INTRAVENOUS

## 2016-05-03 MED ORDER — ATORVASTATIN CALCIUM 40 MG PO TABS: 40.0000 mg | ORAL_TABLET | Freq: Every day | ORAL | Status: DC

## 2016-05-03 MED ORDER — SODIUM CHLORIDE 0.9 % IV SOLN: 250.0000 mL | INTRAVENOUS | Status: DC | PRN

## 2016-05-03 MED ORDER — LABETALOL HCL 5 MG/ML IV SOLN: 20.0000 mg | INTRAVENOUS | Status: DC | PRN

## 2016-05-03 MED ORDER — LIDOCAINE HCL (PF) 1 % IJ SOLN
INTRAMUSCULAR | Status: AC
  Filled 2016-05-03: qty 30

## 2016-05-03 MED ORDER — LIDOCAINE HCL (PF) 1 % IJ SOLN
INTRAMUSCULAR | Status: DC | PRN
  Administered 2016-05-03: 2 mL

## 2016-05-03 MED ORDER — MIDAZOLAM HCL 2 MG/2ML IJ SOLN
INTRAMUSCULAR | Status: AC
  Filled 2016-05-03: qty 2

## 2016-05-03 MED ORDER — SODIUM CHLORIDE 0.9% FLUSH: 3.0000 mL | Freq: Two times a day (BID) | INTRAVENOUS | Status: DC

## 2016-05-03 MED ORDER — VERAPAMIL HCL 2.5 MG/ML IV SOLN
INTRAVENOUS | Status: AC
  Filled 2016-05-03: qty 2

## 2016-05-03 MED ORDER — SODIUM CHLORIDE 0.9 % IV SOLN
INTRAVENOUS | Status: AC
  Administered 2016-05-03: 09:00:00 via INTRAVENOUS

## 2016-05-03 MED ORDER — LABETALOL HCL 5 MG/ML IV SOLN
INTRAVENOUS | Status: DC | PRN
  Administered 2016-05-03: 20 mg via INTRAVENOUS

## 2016-05-03 MED ORDER — HEPARIN SODIUM (PORCINE) 1000 UNIT/ML IJ SOLN
INTRAMUSCULAR | Status: AC
  Filled 2016-05-03: qty 1

## 2016-05-03 MED ORDER — AMLODIPINE BESYLATE 5 MG PO TABS: 5.0000 mg | ORAL_TABLET | Freq: Every day | ORAL | Status: DC

## 2016-05-03 MED ORDER — LABETALOL HCL 5 MG/ML IV SOLN
INTRAVENOUS | Status: AC
  Filled 2016-05-03: qty 4

## 2016-05-03 MED ORDER — VERAPAMIL HCL 2.5 MG/ML IV SOLN
INTRAVENOUS | Status: DC | PRN
  Administered 2016-05-03: 10 mL via INTRA_ARTERIAL

## 2016-05-03 MED ORDER — IOPAMIDOL (ISOVUE-370) INJECTION 76%
INTRAVENOUS | Status: AC
  Filled 2016-05-03: qty 100

## 2016-05-03 MED ORDER — HEPARIN (PORCINE) IN NACL 2-0.9 UNIT/ML-% IJ SOLN
INTRAMUSCULAR | Status: AC
  Filled 2016-05-03: qty 1000

## 2016-05-03 MED ORDER — IOPAMIDOL (ISOVUE-370) INJECTION 76%
INTRAVENOUS | Status: DC | PRN
  Administered 2016-05-03: 90 mL

## 2016-05-03 MED ORDER — ASPIRIN 81 MG PO TBEC: 81.0000 mg | DELAYED_RELEASE_TABLET | Freq: Every day | ORAL | Status: DC

## 2016-05-03 MED ORDER — HEPARIN SODIUM (PORCINE) 1000 UNIT/ML IJ SOLN
INTRAMUSCULAR | Status: DC | PRN
  Administered 2016-05-03: 3000 [IU] via INTRAVENOUS

## 2016-05-03 MED ORDER — SODIUM CHLORIDE 0.9% FLUSH: 3.0000 mL | INTRAVENOUS | Status: DC | PRN

## 2016-05-03 MED ORDER — HEPARIN (PORCINE) IN NACL 2-0.9 UNIT/ML-% IJ SOLN
INTRAMUSCULAR | Status: DC | PRN
  Administered 2016-05-03: 08:00:00

## 2016-05-03 SURGICAL SUPPLY — 10 items
CATH INFINITI 5FR MULTPACK ANG (CATHETERS) ×3 IMPLANT
DEVICE RAD COMP TR BAND LRG (VASCULAR PRODUCTS) ×3 IMPLANT
GLIDESHEATH SLEND SS 6F .021 (SHEATH) ×3 IMPLANT
KIT HEART LEFT (KITS) ×3 IMPLANT
PACK CARDIAC CATHETERIZATION (CUSTOM PROCEDURE TRAY) ×3 IMPLANT
SYR MEDRAD MARK V 150ML (SYRINGE) ×3 IMPLANT
TRANSDUCER W/STOPCOCK (MISCELLANEOUS) ×3 IMPLANT
TUBING CIL FLEX 10 FLL-RA (TUBING) ×3 IMPLANT
WIRE HI TORQ VERSACORE-J 145CM (WIRE) ×3 IMPLANT
WIRE SAFE-T 1.5MM-J .035X260CM (WIRE) ×3 IMPLANT

## 2016-05-03 NOTE — Progress Notes (Signed)
ANTICOAGULATION CONSULT NOTE - Follow Up Consult  Pharmacy Consult for Heparin Indication: chest pain/ACS  Allergies  Allergen Reactions  . Amiodarone Itching  . Amoxicillin Other (See Comments)    Unknown- ? Upset stomach  . Demerol [Meperidine] Other (See Comments)    Hallucinations  . Tape Rash    Can tear the skin, also    Patient Measurements: Height: 5\' 2"  (157.5 cm) Weight: 127 lb 12.8 oz (57.97 kg) IBW/kg (Calculated) : 50.1 Heparin Dosing Weight: 59 kg  Vital Signs: Temp: 98 F (36.7 C) (05/25 2131) Temp Source: Oral (05/25 2131) BP: 188/59 mmHg (05/25 2133) Pulse Rate: 62 (05/25 2131)  Labs:  Recent Labs  05/02/16 0025 05/02/16 0732 05/02/16 1305 05/02/16 1511 05/02/16 1550 05/03/16 0130 05/03/16 0138  HGB 13.1 13.4  --   --   --  12.9  --   HCT 39.2 39.6  --   --   --  37.9  --   PLT 219 223  --   --   --  240  --   APTT  --  74*  --  64*  --   --  77*  HEPARINUNFRC  --  2.16*  --   --  1.48*  --   --   CREATININE 0.86  --   --   --   --   --   --   TROPONINI  --  <0.03 <0.03  --   --   --   --     Estimated Creatinine Clearance: 37.1 mL/min (by C-G formula based on Cr of 0.86).   Medical History: Past Medical History  Diagnosis Date  . Paroxysmal a-fib ([place])   . Hypertension   . Formed visual hallucinations     [name]?  Failed Keppra and Depakote  . Macular degeneration   . Diverticulitis 09/28/2007  . TIA (transient ischemic attack) 2000s?  [name] Neurocysticercosis     Craniotomy at [facility] in early 2000s  . Depression 09/06/2002  . UTI (urinary tract infection)   . Paranoia ([place])   . Bradycardia, drug induced   . Leg pain, left   . Pure hypercholesterolemia 12/20/1999  . Anxiety 01/17/2003  . Cerebral atherosclerosis 09/24/1999  . Back pain 08/19/2005    with radiculopathy  . Chest pain, atypical   . Diverticulosis of colon 06/07/2003  . External hemorrhoids 04/12/2009  . Malaise and fatigue   . Osteoporosis 05/28/2004  . PSVT  (paroxysmal supraventricular tachycardia) ([place]) 02/22/2002  . Palpitations 11/16/2001  . Female climacteric state 03/04/2000  . Abnormal weight loss 05/28/2004  . Urinary incontinence 08/18/2007  . Vaginitis, atrophic   . Vertigo, peripheral 10/07/2000  . Hallucination, visual   . GERD (gastroesophageal reflux disease)   . Osteoarthrosis, localized   . Arthritis     "a little; qwhere" (5/25/20170  . Dementia     "don't know kind or stage" (05/02/2016)    Medications:  Prescriptions prior to admission  Medication Sig Dispense Refill Last Dose  . acetaminophen (TYLENOL) 325 MG tablet Take 650 mg by mouth every 6 (six) hours as needed for mild pain.   Past Week at Unknown time  . apixaban (ELIQUIS) 2.5 MG TABS tablet Take 1 tablet (2.5 mg total) by mouth 2 (two) times daily. 60 tablet 6 04/30/2016 at Unknown time  . calcium carbonate (TUMS - DOSED IN MG ELEMENTAL CALCIUM) 500 MG chewable tablet Chew 1 tablet by mouth 2 (two) times daily as needed for indigestion or heartburn.  Past Month at Unknown time  . docusate sodium (COLACE) 100 MG capsule Take 100 mg by mouth 2 (two) times daily.   04/30/2016 at Unknown time  . donepezil (ARICEPT) 10 MG tablet Take 1 tablet (10 mg total) by mouth daily. 30 tablet 2 04/30/2016 at Unknown time  . hydrALAZINE (APRESOLINE) 10 MG tablet Take 1 tablet (10 mg total) by mouth 2 (two) times daily. 60 tablet 6 04/30/2016 at am  . hydrochlorothiazide (MICROZIDE) 12.5 MG capsule Take 1 capsule (12.5 mg total) by mouth daily. 90 capsule 3 04/30/2016 at Unknown time  . losartan (COZAAR) 100 MG tablet Take 1 tablet (100 mg total) by mouth daily. 90 tablet 3 04/30/2016 at Unknown time  . Melatonin 3 MG CAPS Take 1 capsule (3 mg total) by mouth at bedtime as needed. (Patient taking differently: Take 3 mg by mouth at bedtime. )  0 04/30/2016 at Unknown time  . metoprolol tartrate (LOPRESSOR) 25 MG tablet Take 25 mg by mouth 2 (two) times daily. Hold if pulse goes below 45    04/30/2016 at 2030  . omeprazole (PRILOSEC) 20 MG capsule Take 1 capsule (20 mg total) by mouth daily. 30 capsule 3 04/30/2016 at Unknown time  . risperiDONE (RISPERDAL) 0.25 MG tablet One po qhs x 2 wks, then one half tablet po qhs for 2 wks, then stop 21 tablet 3 04/30/2016 at Unknown time  . tobramycin (TOBREX) 0.3 % ophthalmic solution Place 1 drop into the left eye See admin instructions. Use drops when receiving macular degeneration shot into left eye four times a day for several days   unk  . venlafaxine (EFFEXOR) 37.5 MG tablet Take 37.5 mg by mouth 2 (two) times daily.   04/30/2016 at Unknown time   Scheduled:  . amLODipine  5 mg Oral Daily  . aspirin  81 mg Oral Pre-Cath  . aspirin EC  81 mg Oral Daily  . atorvastatin  40 mg Oral q1800  . docusate sodium  100 mg Oral BID  . donepezil  10 mg Oral Daily  . hydrochlorothiazide  12.5 mg Oral Daily  . losartan  100 mg Oral Daily  . metoprolol tartrate  25 mg Oral BID  . pantoprazole  40 mg Oral Daily  . sodium chloride flush  3 mL Intravenous Q12H  . venlafaxine  37.5 mg Oral BID   Infusions:  . sodium chloride 20 mL/hr at 05/02/16 0846  . sodium chloride     Followed by  . sodium chloride    . heparin 800 Units/hr (05/02/16 1803)    Assessment: 82yo female with history of Afib presents with CP. Currently on IV heparin at 700 units/hr. APTT is sub-therapeutic at 64. HL remains falsely elevated at 1.48. RN reports no s/s of bleeding.   Repeat aPTT is now therapeutic at 77 sec on heparin 800 units/hr. No issues with infusion or bleeding noted.  Goal of Therapy:  Heparin level 0.3-0.7 units/ml aPTT 66-102 seconds Monitor platelets by anticoagulation protocol: Yes   Plan:  Continue heparin 800 units/hr Check anti-Xa level in 8 hours and daily while on heparin Continue to monitor H&H and platelets  Andrey Cota. Diona Foley, PharmD, [place] Clinical Pharmacist Pager [phone]

## 2016-05-03 NOTE — Discharge Summary (Signed)
Discharge Summary    Patient ID: Brittney Gutierrez,  MRN: [id number], DOB/AGE: 04/07/1927 82 y.o.  Admit date: 05/01/2016 Discharge date: 05/03/2016  Primary Care Provider: Sherrie Mustache K Primary Cardiologist: Dr. Sallyanne Kuster  Discharge Diagnoses    Principal Problem:   Chest pain Active Problems:   Dementia   Aortic insufficiency   PAH (pulmonary artery hypertension) (HCC)   Hypertensive urgency   SSS (sick sinus syndrome) (HCC)   GERD (gastroesophageal reflux disease)   Allergies Allergies  Allergen Reactions  . Amiodarone Itching  . Amoxicillin Other (See Comments)    Unknown- ? Upset stomach  . Demerol [Meperidine] Other (See Comments)    Hallucinations  . Tape Rash    Can tear the skin, also    Diagnostic Studies/Procedures    Cath 05/03/2016 Conclusion     Prox RCA lesion, 30% stenosed.  Ost LM lesion, 20% stenosed.  Mid Cx lesion, 30% stenosed.  1. Patent coronary arteries with mild diffuse coronary artery disease 2. Mild to moderate right renal artery stenosis, widely patent left renal artery 3. Normal LVEDP  Recommendations: Medical therapy for severe hypertension    _____________   History of Present Illness     82F with pAF on apixaban, SSS without PPM (due to patient preference), poorly controlled HTN, ?prior TIA, and mild dementia who presents with CP.   Brittney Gutierrez was seen by Truitt Merle, NP in the office on 5/22 because she was dizzy and not feeling well. There was concern for ACE intolerance and it was transitioned to an ARB. She also reported some chest tightness and blurry vision at times. No syncope. Her BPs had been poorly controled, usually in the 190-200 range. Hydralazine was added to her regimen. The following day, the patient experienced chest pain and arm tingling. She called EMS x 2; each time, they apparently checked an ECG, reported it was normal, and did not transport her to the hospital. She was scheduled for an early  follow-up on 5/25. Hydral was stopped the morning of 5/24. K supplementation was also stopped out of concern it was causing loose stools.   At around 9:30 on 5/24, while getting ready for bed, Brittney Gutierrez experienced a recurrence of severe CP and left arm pain. 8/10 in severity. There was associated nausea without vomiting. Symptoms were not exertional or pleuritic. Palpation somewhat reproduced the symptoms. EMS was called and transferred the patient to the hospital. She was given ASA 324mg  and SL NTG x 3 with some symptomatic relief.   On arrival to the ER, VS notable for HR 55 (SB), RR 18, 97% on RA, 174/72. CP was minimal. Labs were notable for K 3.1, Cr 0.86, POC TnI was normal. ECG demonstrated SB with PAC, LVH with repol. CXR demonstrated clear lungs. While in ER she had a recurrence of severe chest pain and was given SL NTG and a nitro patch with improvement in her symptoms.   Hospital Course     Given recurrent chest discomfort and uncontrolled high blood pressure, patient eventually underwent cardiac catheterization with renal angiography on 5/26, which showed only mild nonobstructive disease. Mild to moderate right renal artery stenosis, patent left renal artery. Patient was seen post cath on 05/03/2016, at which time she was feeling well and denies any significant chest discomfort. Her left radial cath site appears to be stable without significant bleeding. After discussing with Dr. Gwenlyn Found, patient is cleared for discharge from cardiology perspective. We have added Lipitor to her medical regimen,  given only mild obstructive disease and the need for eliquis, we will hold off on starting aspirin at this time. 5 mg Norvasc was added to her medical regimen try to control blood pressure. She may restart eliquis tomorrow morning. I have arranged outpatient follow-up at hypertension clinic at [facility] to further titrate her medication. I have also arranged follow-up with Dr. Sallyanne Kuster. Since her symptoms  started after starting on hydralazine, I have discontinued hydralazine at this time.  Unclear at this time if hydralazine truly caused her symptom but I would avoid it for the future.  _____________  Discharge Vitals Blood pressure 136/51, pulse 60, temperature 97.5 F (36.4 C), temperature source Oral, resp. rate 18, height 5\' 2"  (1.575 m), weight 127 lb 6.4 oz (57.788 kg), SpO2 100 %.  Filed Weights   05/02/16 0108 05/02/16 0505 05/03/16 0531  Weight: 130 lb (58.968 kg) 127 lb 12.8 oz (57.97 kg) 127 lb 6.4 oz (57.788 kg)    Labs & Radiologic Studies     CBC  Recent Labs  05/02/16 0025 05/02/16 0732 05/03/16 0130  WBC 5.8 5.7 5.9  NEUTROABS 3.4  --   --   HGB 13.1 13.4 12.9  HCT 39.2 39.6 37.9  MCV 85.0 84.3 85.2  PLT 219 223 [id number]   Basic Metabolic Panel  Recent Labs  05/02/16 0025 05/02/16 0732 05/03/16 0633  NA 129*  --  132*  K 3.1*  --  3.7  CL 91*  --  96*  CO2 26  --  27  GLUCOSE 91  --  98  BUN 12  --  9  CREATININE 0.86  --  0.92  CALCIUM 9.8  --  9.2  MG  --  1.8  --    Liver Function Tests  Recent Labs  05/02/16 0025  AST 22  ALT 14  ALKPHOS 75  BILITOT 0.6  PROT 6.8  ALBUMIN 3.4*   Cardiac Enzymes  Recent Labs  05/02/16 0732 05/02/16 1305  TROPONINI <0.03 <0.03   Hemoglobin A1C  Recent Labs  05/02/16 0732  HGBA1C 6.2*   Fasting Lipid Panel  Recent Labs  05/02/16 0732  CHOL 195  HDL 65  LDLCALC 120*  TRIG 49  CHOLHDL 3.0    Dg Chest 2 View  05/02/2016  CLINICAL DATA:  Acute onset of central chest pain and heaviness. Initial encounter. EXAM: CHEST  2 VIEW COMPARISON:  Chest radiograph performed 02/29/2016 FINDINGS: The lungs are well-aerated. Minimal bibasilar atelectasis is noted. There is no evidence of pleural effusion or pneumothorax. The heart is normal in size; the mediastinal contour is within normal limits. No acute osseous abnormalities are seen. There is a chronic displaced fracture of the right clavicle. Clips  are noted within the right upper quadrant, reflecting prior cholecystectomy. IMPRESSION: Minimal bibasilar atelectasis noted.  Lungs otherwise clear. Electronically Signed   By: Garald Balding M.D.   On: 05/02/2016 00:12    Disposition   Pt is being discharged home today in good condition.  Follow-up Plans & Appointments    Follow-up Information    Follow up with [facility] On 05/21/2016.   Specialty:  Cardiology   Why:  1:00PM. Followup with clinical pharmacist to help manage blood pressure   Contact information:   [address] [place] [phone]      Follow up with Sanda Klein, MD On 06/14/2016.   Specialty:  Cardiology   Why:  1:45PM. Cardiology followup.   Contact information:  [address] [place] [address] [phone]       Schedule an appointment as soon as possible for a visit with Dewaine Oats, JESSICA K, NP.   Specialty:  Geriatric Medicine   Contact information:   [place] [address] [phone]      Discharge Instructions    Diet - low sodium heart healthy    Complete by:  As directed      Increase activity slowly    Complete by:  As directed            Discharge Medications   Current Discharge Medication List    START taking these medications   Details  amLODipine (NORVASC) 5 MG tablet Take 1 tablet (5 mg total) by mouth daily. Qty: 90 tablet, Refills: 3    atorvastatin (LIPITOR) 40 MG tablet Take 1 tablet (40 mg total) by mouth daily at 6 PM. Qty: 90 tablet, Refills: 3      CONTINUE these medications which have NOT CHANGED   Details  acetaminophen (TYLENOL) 325 MG tablet Take 650 mg by mouth every 6 (six) hours as needed for mild pain.    apixaban (ELIQUIS) 2.5 MG TABS tablet Take 1 tablet (2.5 mg total) by mouth 2 (two) times daily. Qty: 60 tablet, Refills: 6    calcium carbonate (TUMS - DOSED IN MG ELEMENTAL CALCIUM) 500 MG chewable tablet Chew  1 tablet by mouth 2 (two) times daily as needed for indigestion or heartburn.    docusate sodium (COLACE) 100 MG capsule Take 100 mg by mouth 2 (two) times daily.    donepezil (ARICEPT) 10 MG tablet Take 1 tablet (10 mg total) by mouth daily. Qty: 30 tablet, Refills: 2   Associated Diagnoses: Dementia with behavioral disturbance    hydrochlorothiazide (MICROZIDE) 12.5 MG capsule Take 1 capsule (12.5 mg total) by mouth daily. Qty: 90 capsule, Refills: 3    losartan (COZAAR) 100 MG tablet Take 1 tablet (100 mg total) by mouth daily. Qty: 90 tablet, Refills: 3    Melatonin 3 MG CAPS Take 1 capsule (3 mg total) by mouth at bedtime as needed. Refills: 0    metoprolol tartrate (LOPRESSOR) 25 MG tablet Take 25 mg by mouth 2 (two) times daily. Hold if pulse goes below 45    omeprazole (PRILOSEC) 20 MG capsule Take 1 capsule (20 mg total) by mouth daily. Qty: 30 capsule, Refills: 3   Associated Diagnoses: Gastroesophageal reflux disease without esophagitis    risperiDONE (RISPERDAL) 0.25 MG tablet One po qhs x 2 wks, then one half tablet po qhs for 2 wks, then stop Qty: 21 tablet, Refills: 3   Associated Diagnoses: Dementia with behavioral disturbance; Hallucinations, visual    tobramycin (TOBREX) 0.3 % ophthalmic solution Place 1 drop into the left eye See admin instructions. Use drops when receiving macular degeneration shot into left eye four times a day for several days    venlafaxine (EFFEXOR) 37.5 MG tablet Take 37.5 mg by mouth 2 (two) times daily.      STOP taking these medications     hydrALAZINE (APRESOLINE) 10 MG tablet            Outstanding Labs/Studies   None  Duration of Discharge Encounter   Greater than 30 minutes including physician time.  Signed, Almyra Deforest PA-C 05/03/2016, 2:19 PM

## 2016-05-03 NOTE — Interval H&P Note (Signed)
Cath Lab Visit (complete for each Cath Lab visit)  Clinical Evaluation Leading to the Procedure:   ACS: Yes.    Non-ACS:    Anginal Classification: CCS III  Anti-ischemic medical therapy: Maximal Therapy (2 or more classes of medications)  Non-Invasive Test Results: No non-invasive testing performed  Prior CABG: No previous CABG      History and Physical Interval Note:  05/03/2016 7:40 AM  Brittney Gutierrez  has presented today for surgery, with the diagnosis of angina  The various methods of treatment have been discussed with the patient and family. After consideration of risks, benefits and other options for treatment, the patient has consented to  Procedure(s): Left Heart Cath and Coronary Angiography (N/A) as a surgical intervention .  The patient's history has been reviewed, patient examined, no change in status, stable for surgery.  I have reviewed the patient's chart and labs.  Questions were answered to the patient's satisfaction.     Sherren Mocha

## 2016-05-03 NOTE — Progress Notes (Signed)
Subjective:  No CP/SOB, POD #1 Cath  Objective:  Temp:  [97.6 F (36.4 C)-98.1 F (36.7 C)] 98.1 F (36.7 C) (05/26 0531) Pulse Rate:  [0-64] 57 (05/26 0813) Resp:  [12-19] 17 (05/26 0813) BP: (154-202)/(48-79) 192/74 mmHg (05/26 0813) SpO2:  [0 %-100 %] 100 % (05/26 0813) Weight:  [127 lb 6.4 oz (57.788 kg)] 127 lb 6.4 oz (57.788 kg) (05/26 0531) Weight change: -2 lb 9.6 oz (-1.179 kg)  Intake/Output from previous day:    Intake/Output from this shift:    Physical Exam: General appearance: alert and no distress Neck: no adenopathy, no carotid bruit, no JVD, supple, symmetrical, trachea midline and thyroid not enlarged, symmetric, no tenderness/mass/nodules Lungs: clear to auscultation bilaterally Heart: regular rate and rhythm, S1, S2 normal, no murmur, click, rub or gallop Extremities: extremities normal, atraumatic, no cyanosis or edema  Lab Results: Results for orders placed or performed during the hospital encounter of 05/01/16 (from the past 48 hour(s))  CBC with Differential/Platelet     Status: None   Collection Time: 05/02/16 12:25 AM  Result Value Ref Range   WBC 5.8 4.0 - 10.5 K/uL   RBC 4.61 3.87 - 5.11 MIL/uL   Hemoglobin 13.1 12.0 - 15.0 g/dL   HCT 39.2 36.0 - 46.0 %   MCV 85.0 78.0 - 100.0 fL   MCH 28.4 26.0 - 34.0 pg   MCHC 33.4 30.0 - 36.0 g/dL   RDW 13.5 11.5 - 15.5 %   Platelets 219 150 - 400 K/uL   Neutrophils Relative % 60 %   Neutro Abs 3.4 1.7 - 7.7 K/uL   Lymphocytes Relative 27 %   Lymphs Abs 1.6 0.7 - 4.0 K/uL   Monocytes Relative 10 %   Monocytes Absolute 0.6 0.1 - 1.0 K/uL   Eosinophils Relative 2 %   Eosinophils Absolute 0.1 0.0 - 0.7 K/uL   Basophils Relative 1 %   Basophils Absolute 0.0 0.0 - 0.1 K/uL  Comprehensive metabolic panel     Status: Abnormal   Collection Time: 05/02/16 12:25 AM  Result Value Ref Range   Sodium 129 (L) 135 - 145 mmol/L   Potassium 3.1 (L) 3.5 - 5.1 mmol/L   Chloride 91 (L) 101 - 111 mmol/L   CO2 26 22 - 32 mmol/L   Glucose, Bld 91 65 - 99 mg/dL   BUN 12 6 - 20 mg/dL   Creatinine, Ser 0.86 0.44 - 1.00 mg/dL   Calcium 9.8 8.9 - 10.3 mg/dL   Total Protein 6.8 6.5 - 8.1 g/dL   Albumin 3.4 (L) 3.5 - 5.0 g/dL   AST 22 15 - 41 U/L   ALT 14 14 - 54 U/L   Alkaline Phosphatase 75 38 - 126 U/L   Total Bilirubin 0.6 0.3 - 1.2 mg/dL   GFR calc non Af Amer 59 (L) >60 mL/min   GFR calc Af Amer >60 >60 mL/min    Comment: (NOTE) The eGFR has been calculated using the CKD EPI equation. This calculation has not been validated in all clinical situations. eGFR's persistently <60 mL/min signify possible Chronic Kidney Disease.    Anion gap 12 5 - 15  I-stat troponin, ED     Status: None   Collection Time: 05/02/16 12:28 AM  Result Value Ref Range   Troponin i, poc 0.00 0.00 - 0.08 ng/mL   Comment 3            Comment: Due to the release kinetics of cTnI,  a negative result within the first hours of the onset of symptoms does not rule out myocardial infarction with certainty. If myocardial infarction is still suspected, repeat the test at appropriate intervals.   APTT     Status: Abnormal   Collection Time: 05/02/16  7:32 AM  Result Value Ref Range   aPTT 74 (H) 24 - 37 seconds    Comment:        IF BASELINE aPTT IS ELEVATED, SUGGEST PATIENT RISK ASSESSMENT BE USED TO DETERMINE APPROPRIATE ANTICOAGULANT THERAPY.   Heparin level (unfractionated)     Status: Abnormal   Collection Time: 05/02/16  7:32 AM  Result Value Ref Range   Heparin Unfractionated 2.16 (H) 0.30 - 0.70 IU/mL    Comment: RESULTS CONFIRMED BY MANUAL DILUTION        IF HEPARIN RESULTS ARE BELOW EXPECTED VALUES, AND PATIENT DOSAGE HAS BEEN CONFIRMED, SUGGEST FOLLOW UP TESTING OF ANTITHROMBIN III LEVELS.   CBC     Status: None   Collection Time: 05/02/16  7:32 AM  Result Value Ref Range   WBC 5.7 4.0 - 10.5 K/uL    Comment: REPEATED TO VERIFY WHITE COUNT CONFIRMED ON SMEAR    RBC 4.70 3.87 - 5.11 MIL/uL     Hemoglobin 13.4 12.0 - 15.0 g/dL   HCT 39.6 36.0 - 46.0 %   MCV 84.3 78.0 - 100.0 fL   MCH 28.5 26.0 - 34.0 pg   MCHC 33.8 30.0 - 36.0 g/dL   RDW 13.5 11.5 - 15.5 %   Platelets 223 150 - 400 K/uL    Comment: SPECIMEN CHECKED FOR CLOTS REPEATED TO VERIFY PLATELET COUNT CONFIRMED BY SMEAR   Troponin I-serum (0, 3, 6 hours)     Status: None   Collection Time: 05/02/16  7:32 AM  Result Value Ref Range   Troponin I <0.03 <0.031 ng/mL    Comment:        NO INDICATION OF MYOCARDIAL INJURY.   Magnesium     Status: None   Collection Time: 05/02/16  7:32 AM  Result Value Ref Range   Magnesium 1.8 1.7 - 2.4 mg/dL  Lipid panel     Status: Abnormal   Collection Time: 05/02/16  7:32 AM  Result Value Ref Range   Cholesterol 195 0 - 200 mg/dL   Triglycerides 49 <150 mg/dL   HDL 65 >40 mg/dL   Total CHOL/HDL Ratio 3.0 RATIO   VLDL 10 0 - 40 mg/dL   LDL Cholesterol 120 (H) 0 - 99 mg/dL    Comment:        Total Cholesterol/HDL:CHD Risk Coronary Heart Disease Risk Table                     Men   Women  1/2 Average Risk   3.4   3.3  Average Risk       5.0   4.4  2 X Average Risk   9.6   7.1  3 X Average Risk  23.4   11.0        Use the calculated Patient Ratio above and the CHD Risk Table to determine the patient's CHD Risk.        ATP III CLASSIFICATION (LDL):  <100     mg/dL   Optimal  100-129  mg/dL   Near or Above                    Optimal  130-159  mg/dL  Borderline  160-189  mg/dL   High  >190     mg/dL   Very High   Hemoglobin A1c     Status: Abnormal   Collection Time: 05/02/16  7:32 AM  Result Value Ref Range   Hgb A1c MFr Bld 6.2 (H) 4.8 - 5.6 %    Comment: (NOTE)         Pre-diabetes: 5.7 - 6.4         Diabetes: >6.4         Glycemic control for adults with diabetes: <7.0    Mean Plasma Glucose 131 mg/dL    Comment: (NOTE) Performed At: [facility] [address] [place], [place] [id number] [place] William F MD Ph:[phone]   Troponin I-serum  (0, 3, 6 hours)     Status: None   Collection Time: 05/02/16  1:05 PM  Result Value Ref Range   Troponin I <0.03 <0.031 ng/mL    Comment:        NO INDICATION OF MYOCARDIAL INJURY.   APTT     Status: Abnormal   Collection Time: 05/02/16  3:11 PM  Result Value Ref Range   aPTT 64 (H) 24 - 37 seconds    Comment:        IF BASELINE aPTT IS ELEVATED, SUGGEST PATIENT RISK ASSESSMENT BE USED TO DETERMINE APPROPRIATE ANTICOAGULANT THERAPY.   Heparin level (unfractionated)     Status: Abnormal   Collection Time: 05/02/16  3:50 PM  Result Value Ref Range   Heparin Unfractionated 1.48 (H) 0.30 - 0.70 IU/mL    Comment: RESULTS CONFIRMED BY MANUAL DILUTION        IF HEPARIN RESULTS ARE BELOW EXPECTED VALUES, AND PATIENT DOSAGE HAS BEEN CONFIRMED, SUGGEST FOLLOW UP TESTING OF ANTITHROMBIN III LEVELS.   CBC     Status: None   Collection Time: 05/03/16  1:30 AM  Result Value Ref Range   WBC 5.9 4.0 - 10.5 K/uL   RBC 4.45 3.87 - 5.11 MIL/uL   Hemoglobin 12.9 12.0 - 15.0 g/dL   HCT 37.9 36.0 - 46.0 %   MCV 85.2 78.0 - 100.0 fL   MCH 29.0 26.0 - 34.0 pg   MCHC 34.0 30.0 - 36.0 g/dL   RDW 13.2 11.5 - 15.5 %   Platelets 240 150 - 400 K/uL  Heparin level (unfractionated)     Status: Abnormal   Collection Time: 05/03/16  1:38 AM  Result Value Ref Range   Heparin Unfractionated 1.10 (H) 0.30 - 0.70 IU/mL    Comment: RESULTS CONFIRMED BY MANUAL DILUTION        IF HEPARIN RESULTS ARE BELOW EXPECTED VALUES, AND PATIENT DOSAGE HAS BEEN CONFIRMED, SUGGEST FOLLOW UP TESTING OF ANTITHROMBIN III LEVELS.   APTT     Status: Abnormal   Collection Time: 05/03/16  1:38 AM  Result Value Ref Range   aPTT 77 (H) 24 - 37 seconds    Comment:        IF BASELINE aPTT IS ELEVATED, SUGGEST PATIENT RISK ASSESSMENT BE USED TO DETERMINE APPROPRIATE ANTICOAGULANT THERAPY.   Basic metabolic panel     Status: Abnormal   Collection Time: 05/03/16  6:33 AM  Result Value Ref Range   Sodium 132 (L) 135 -  145 mmol/L   Potassium 3.7 3.5 - 5.1 mmol/L   Chloride 96 (L) 101 - 111 mmol/L   CO2 27 22 - 32 mmol/L   Glucose, Bld 98 65 - 99 mg/dL   BUN 9 6 -   20 mg/dL   Creatinine, Ser 0.92 0.44 - 1.00 mg/dL   Calcium 9.2 8.9 - 10.3 mg/dL   GFR calc non Af Amer 55 (L) >60 mL/min   GFR calc Af Amer >60 >60 mL/min    Comment: (NOTE) The eGFR has been calculated using the CKD EPI equation. This calculation has not been validated in all clinical situations. eGFR's persistently <60 mL/min signify possible Chronic Kidney Disease.    Anion gap 9 5 - 15  Protime-INR     Status: None   Collection Time: 05/03/16  6:33 AM  Result Value Ref Range   Prothrombin Time 14.2 11.6 - 15.2 seconds   INR 1.08 0.00 - 1.49    Imaging: Imaging results have been reviewed  Assessment/Plan:   1. Active Problems: 2.   Chest pain 3.   Time Spent Directly with Patient:  15 minutes  Length of Stay:  LOS: 1 day   POD #1 Cath/ Renal angio for CP and resistant HTN. Eliquis held for procedure. No signif CAD and renal arteries widely patent. Exam benign. Labs OK. Groin OK. Amlodipine started. BP still elevated (Essential HTN). OK for DC home. Restart Eliquis tomorrow at home dose. Continue aggressive Rx of HTN as OP.   Berry, Jonathan 05/03/2016, 9:17 AM 

## 2016-05-03 NOTE — Progress Notes (Signed)
Patient in stable condition, this RN went over discharge instructions with patient and family, they verbalised understanding, paper prescription given to family, patient belongings at bedside, iv taken out cardiac monitor dc, ccmd notified, patient taken off the unit on a wheelchair.

## 2016-05-03 NOTE — Discharge Instructions (Signed)
No lifting over 5 lbs for 1 week. No sexual activity for 1 week. Keep procedure site clean & dry. If you notice increased pain, swelling, bleeding or pus, call/return!  You may shower, but no soaking baths/hot tubs/pools for 1 week.   Restart eliquis tomorrow on 5/27

## 2016-05-03 NOTE — H&P (View-Only) (Signed)
Interval coverage note:  Primary Cardiologist: Dr. Sallyanne Kuster  82 yo female with PMH of PAF on eliquis, SSS without PPM (due to patient preference), poorly controlled HTN, ?prior TIA and mild dementia who presented with CP. Normal EF on echo 3/17. Reportedly normal myoview in [place] 08/2015. She was seen in the office on 5/22 for dizziness and malaise. BP was 190-200. Her ACEI was switched to ARB. Hydralazine added. She took hydralazine then had chest pain. She refused EMS taking her to the hospital twice. She had another episode of chest pain in the afternoon of 5/24 and subsequently presented to the hospital again. Trop negative x 1    Subjective No chest pain since yesterday night. No SOB    Physical exam:  Heart: RRR, no murmur Lung: CTA, no rhonchi, wheezing or rale General: A&Ox3. NAD Abdomen: soft nontender Extremities: no edema   Plan:  1. Recurrent chest pain at rest  - EKG shows LVH with repol, minimal ST depression in II and aVF likely part of repol  - Echo 03/01/2016 EF 50-55%, moderate LVH  - will discuss with MD regarding myoview vs cath  2. PAF on eliquis, currently in sinus  - CHA2DS2-Vasc score of at least 4 (if + TIA would be 7)  - eliquis on hold  3. SSS without PPM (due to patient preference)  4. poorly controlled HTN: still elevated, will increase HCTZ. No more uptitration of AV nodal blocking agent due to underlying bradycardia   Hilbert Corrigan PA Pager: [phone]  Agree with note by Almyra Deforest PA-C  Pt with recurrent CP, PAF and resistant HTN on Eliquis. EKG shows LVH with repol changes. Exam benign. Pt agreeable to cor angio. Will also need abd AoGram to visualize renal arteries.  Lorretta Harp, M.D., [place], [facility], [place] [address] [place], [place]  [address]  [phone] 05/02/2016 1:36 PM

## 2016-05-04 ENCOUNTER — Telehealth: Payer: Self-pay | Admitting: Cardiology

## 2016-05-04 NOTE — Telephone Encounter (Signed)
Pt's daughter called. The pt has a dry cough, constantly clearing her throat and she wanted to know if it was medication. I told her she was not on anything that I could see would cause a dry cough and referred her to her PCP.  Kerin Ransom PA-C 05/04/2016 11:19 AM

## 2016-05-05 ENCOUNTER — Telehealth: Payer: Self-pay | Admitting: Cardiology

## 2016-05-05 NOTE — Telephone Encounter (Signed)
Daughter called again about her mother's dry cough. The daughter thinks its from [place]. I suggested she hold this and come in for a pharmacy consult.  Kerin Ransom PA-C 05/05/2016 1:12 PM

## 2016-05-06 ENCOUNTER — Emergency Department (HOSPITAL_COMMUNITY): Payer: Medicare Other

## 2016-05-06 ENCOUNTER — Emergency Department (HOSPITAL_COMMUNITY)
Admission: EM | Admit: 2016-05-06 | Discharge: 2016-05-06 | Disposition: A | Discharge status: Home / Self Care | Payer: Medicare Other | Attending: Emergency Medicine | Admitting: Emergency Medicine

## 2016-05-06 ENCOUNTER — Encounter (HOSPITAL_COMMUNITY): Payer: Self-pay | Admitting: *Deleted

## 2016-05-06 DIAGNOSIS — Z8669 Personal history of other diseases of the nervous system and sense organs: Secondary | ICD-10-CM | POA: Diagnosis not present

## 2016-05-06 DIAGNOSIS — Z792 Long term (current) use of antibiotics: Secondary | ICD-10-CM | POA: Insufficient documentation

## 2016-05-06 DIAGNOSIS — F0390 Unspecified dementia without behavioral disturbance: Secondary | ICD-10-CM | POA: Insufficient documentation

## 2016-05-06 DIAGNOSIS — Z8619 Personal history of other infectious and parasitic diseases: Secondary | ICD-10-CM | POA: Diagnosis not present

## 2016-05-06 DIAGNOSIS — R0989 Other specified symptoms and signs involving the circulatory and respiratory systems: Secondary | ICD-10-CM | POA: Diagnosis not present

## 2016-05-06 DIAGNOSIS — Z8673 Personal history of transient ischemic attack (TIA), and cerebral infarction without residual deficits: Secondary | ICD-10-CM | POA: Diagnosis not present

## 2016-05-06 DIAGNOSIS — Z9889 Other specified postprocedural states: Secondary | ICD-10-CM | POA: Insufficient documentation

## 2016-05-06 DIAGNOSIS — I10 Essential (primary) hypertension: Secondary | ICD-10-CM | POA: Insufficient documentation

## 2016-05-06 DIAGNOSIS — M810 Age-related osteoporosis without current pathological fracture: Secondary | ICD-10-CM | POA: Insufficient documentation

## 2016-05-06 DIAGNOSIS — F419 Anxiety disorder, unspecified: Secondary | ICD-10-CM | POA: Diagnosis not present

## 2016-05-06 DIAGNOSIS — Z880 Allergy status to penicillin: Secondary | ICD-10-CM | POA: Diagnosis not present

## 2016-05-06 DIAGNOSIS — K219 Gastro-esophageal reflux disease without esophagitis: Secondary | ICD-10-CM | POA: Insufficient documentation

## 2016-05-06 DIAGNOSIS — Z8742 Personal history of other diseases of the female genital tract: Secondary | ICD-10-CM | POA: Insufficient documentation

## 2016-05-06 DIAGNOSIS — I480 Paroxysmal atrial fibrillation: Secondary | ICD-10-CM | POA: Insufficient documentation

## 2016-05-06 DIAGNOSIS — E7800 Pure hypercholesterolemia, unspecified: Secondary | ICD-10-CM | POA: Diagnosis not present

## 2016-05-06 DIAGNOSIS — M1990 Unspecified osteoarthritis, unspecified site: Secondary | ICD-10-CM | POA: Insufficient documentation

## 2016-05-06 DIAGNOSIS — Z87440 Personal history of urinary (tract) infections: Secondary | ICD-10-CM | POA: Insufficient documentation

## 2016-05-06 DIAGNOSIS — R6889 Other general symptoms and signs: Secondary | ICD-10-CM

## 2016-05-06 DIAGNOSIS — F329 Major depressive disorder, single episode, unspecified: Secondary | ICD-10-CM | POA: Insufficient documentation

## 2016-05-06 DIAGNOSIS — J9811 Atelectasis: Secondary | ICD-10-CM | POA: Diagnosis not present

## 2016-05-06 DIAGNOSIS — Z79899 Other long term (current) drug therapy: Secondary | ICD-10-CM | POA: Insufficient documentation

## 2016-05-06 MED ORDER — SUCRALFATE 1 GM/10ML PO SUSP: 1.0000 g | Freq: Three times a day (TID) | ORAL | Status: DC

## 2016-05-06 MED ORDER — GI COCKTAIL ~~LOC~~
30.0000 mL | Freq: Once | ORAL | Status: AC
  Administered 2016-05-06: 30 mL via ORAL
  Filled 2016-05-06: qty 30

## 2016-05-06 NOTE — ED Notes (Signed)
EDP at bedside  

## 2016-05-06 NOTE — ED Provider Notes (Signed)
CSN: [id number]     Arrival date & time 05/06/16  0050 History   First MD Initiated Contact with Patient 05/06/16 0423     Chief Complaint  Patient presents with  . Feels like she is choking      (Consider location/radiation/quality/duration/timing/severity/associated sxs/prior Treatment) Patient is a 82 y.o. female presenting with general illness. The history is provided by the patient.  Illness Location:  Throat Quality:  Clearing of throat repeatedly Severity:  Moderate Onset quality:  Sudden Duration:  1 hour Timing:  Intermittent Progression:  Unchanged Chronicity:  New Context:  Lots of new medication Relieved by:  Nothing Worsened by:  Nothing tried Ineffective treatments:  None tried Associated symptoms: no abdominal pain, no congestion, no cough, no loss of consciousness, no shortness of breath and no sore throat   Risk factors:  Elderly   Past Medical History  Diagnosis Date  . Paroxysmal a-fib ([place])   . Hypertension   . Formed visual hallucinations     [name]?  Failed Keppra and Depakote  . Macular degeneration   . Diverticulitis 09/28/2007  . TIA (transient ischemic attack) 2000s?  [name] Neurocysticercosis     Craniotomy at [facility] in early 2000s  . Depression 09/06/2002  . UTI (urinary tract infection)   . Paranoia ([place])   . Bradycardia, drug induced   . Leg pain, left   . Pure hypercholesterolemia 12/20/1999  . Anxiety 01/17/2003  . Cerebral atherosclerosis 09/24/1999  . Back pain 08/19/2005    with radiculopathy  . Chest pain, atypical     cath 05/03/2016 for recurrent CP, mild nonobstructive dx. Mild to moderate R renal artery stenosis, patent L renal artery  . Diverticulosis of colon 06/07/2003  . External hemorrhoids 04/12/2009  . Malaise and fatigue   . Osteoporosis 05/28/2004  . PSVT (paroxysmal supraventricular tachycardia) ([place]) 02/22/2002  . Palpitations 11/16/2001  . Female climacteric state 03/04/2000  . Abnormal weight loss 05/28/2004  .  Urinary incontinence 08/18/2007  . Vaginitis, atrophic   . Vertigo, peripheral 10/07/2000  . Hallucination, visual   . GERD (gastroesophageal reflux disease)   . Osteoarthrosis, localized   . Arthritis     "a little; qwhere" (5/25/20170  . Dementia     "don't know kind or stage" (05/02/2016)  . Hypertension     cath 05/03/2016 Mild to moderate R renal artery stenosis, patent L renal artery   Past Surgical History  Procedure Laterality Date  . Shoulder arthroscopy w/ rotator cuff repair Right X 3  . Total knee arthroplasty Left 1990  . Cataract extraction  2013  . Vaginal hysterectomy  1960  . Joint replacement    . Brain surgery  2000    Neurocysticercosis ("parasite")  . Cataract extraction, bilateral      "not sure if both eyes; feel like it probably was"  . Cardiac catheterization N/A 05/03/2016    Procedure: Left Heart Cath and Coronary Angiography;  Surgeon: Sherren Mocha, MD;  Location: [facility];  Service: Cardiovascular;  Laterality: N/A;  . Peripheral vascular catheterization N/A 05/03/2016    Procedure: Abdominal Aortogram;  Surgeon: Sherren Mocha, MD;  Location: [facility];  Service: Cardiovascular;  Laterality: N/A;   Family History  Problem Relation Age of Onset  . Dementia Neg Hx   . Polycystic kidney disease Mother   . Heart attack Father   . Hypertension Sister    Social History  Substance Use Topics  . Smoking status: Never Smoker   .  Smokeless tobacco: Never Used  . Alcohol Use: No   OB History    No data available     Review of Systems  HENT: Negative for congestion and sore throat.   Respiratory: Negative for cough and shortness of breath.   Gastrointestinal: Negative for abdominal pain.  Neurological: Negative for loss of consciousness.  All other systems reviewed and are negative.     Allergies  Amiodarone; Amoxicillin; Demerol; and Tape  Home Medications   Prior to Admission medications   Medication Sig Start Date End  Date Taking? Authorizing Provider  acetaminophen (TYLENOL) 325 MG tablet Take 650 mg by mouth every 6 (six) hours as needed for mild pain.   Yes Historical Provider, MD  amLODipine (NORVASC) 5 MG tablet Take 1 tablet (5 mg total) by mouth daily. 05/03/16  Yes Almyra Deforest, PA  apixaban (ELIQUIS) 2.5 MG TABS tablet Take 1 tablet (2.5 mg total) by mouth 2 (two) times daily. 03/04/16  Yes Bhavinkumar Bhagat, PA  atorvastatin (LIPITOR) 40 MG tablet Take 1 tablet (40 mg total) by mouth daily at 6 PM. 05/03/16  Yes Almyra Deforest, PA  calcium carbonate (TUMS - DOSED IN MG ELEMENTAL CALCIUM) 500 MG chewable tablet Chew 1 tablet by mouth 2 (two) times daily as needed for indigestion or heartburn.   Yes Historical Provider, MD  docusate sodium (COLACE) 100 MG capsule Take 100 mg by mouth 2 (two) times daily.   Yes Historical Provider, MD  donepezil (ARICEPT) 10 MG tablet Take 1 tablet (10 mg total) by mouth daily. 04/06/16  Yes Tiffany L Reed, DO  hydrochlorothiazide (MICROZIDE) 12.5 MG capsule Take 1 capsule (12.5 mg total) by mouth daily. 04/29/16  Yes Burtis Junes, NP  losartan (COZAAR) 100 MG tablet Take 1 tablet (100 mg total) by mouth daily. 04/29/16  Yes Burtis Junes, NP  Melatonin 3 MG CAPS Take 1 capsule (3 mg total) by mouth at bedtime as needed. Patient taking differently: Take 3 mg by mouth at bedtime.  02/12/16  Yes Lauree Chandler, NP  metoprolol tartrate (LOPRESSOR) 25 MG tablet Take 25 mg by mouth 2 (two) times daily. Hold if pulse goes below 45   Yes Historical Provider, MD  omeprazole (PRILOSEC) 20 MG capsule Take 1 capsule (20 mg total) by mouth daily. 04/18/16  Yes Man Mast X, NP  tobramycin (TOBREX) 0.3 % ophthalmic solution Place 1 drop into the left eye See admin instructions. Use drops when receiving macular degeneration shot into left eye four times a day for several days 03/11/16  Yes Historical Provider, MD  venlafaxine (EFFEXOR) 37.5 MG tablet Take 37.5 mg by mouth 2 (two) times daily.   Yes  Historical Provider, MD   BP 154/73 mmHg  Pulse 80  Temp(Src) 98 F (36.7 C) (Oral)  Resp 16  Ht 5\' 2"  (1.575 m)  Wt 129 lb 8 oz (58.741 kg)  BMI 23.68 kg/m2  SpO2 95% Physical Exam  Constitutional: She is oriented to person, place, and time. She appears well-developed and well-nourished. No distress.  HENT:  Head: Normocephalic and atraumatic.  Mouth/Throat: Oropharynx is clear and moist.  Eyes: Conjunctivae are normal. Pupils are equal, round, and reactive to light.  Neck: Normal range of motion. Neck supple. No tracheal deviation present.  Cardiovascular: Normal rate, regular rhythm and intact distal pulses.   Pulmonary/Chest: Effort normal and breath sounds normal. No stridor. No respiratory distress. She has no wheezes. She has no rales.  Abdominal: Soft. Bowel sounds are normal.  There is no tenderness. There is no rebound and no guarding.  Musculoskeletal: Normal range of motion.  Lymphadenopathy:    She has no cervical adenopathy.  Neurological: She is alert and oriented to person, place, and time.  Skin: Skin is warm and dry.  Psychiatric: She has a normal mood and affect.    ED Course  Procedures (including critical care time) Labs Review Labs Reviewed - No data to display  Imaging Review Dg Neck Soft Tissue  05/06/2016  CLINICAL DATA:  Foreign body sensation in throat. EXAM: NECK SOFT TISSUES - 1+ VIEW COMPARISON:  None. FINDINGS: There is no evidence of retropharyngeal soft tissue swelling or epiglottic enlargement. The cervical airway is unremarkable. No radio-opaque foreign body identified. Carotid vascular calcifications are seen. Degenerative change in the cervical spine. IMPRESSION: No radiopaque foreign body.  Cervical airway appears patent. Carotid vascular calcifications. Electronically Signed   By: Jeb Levering M.D.   On: 05/06/2016 05:32   Dg Chest 2 View  05/06/2016  CLINICAL DATA:  Foreign body sensation in throat. EXAM: CHEST  2 VIEW COMPARISON:   05/01/2016 FINDINGS: No radiopaque foreign body. The heart size and mediastinal contours are normal. Minimal left basilar atelectasis is unchanged previous right basilar atelectasis has improved. Patient rotation accounting for rightward appearance the tracheal air column, unchanged from priors. No pulmonary edema, focal consolidation, pleural effusion or pneumothorax. Remote right clavicle fracture and superior subluxation of the right humeral head indicating chronic rotator cuff arthropathy. IMPRESSION: Left greater than right basilar atelectasis. No new abnormality is seen. Electronically Signed   By: Jeb Levering M.D.   On: 05/06/2016 05:35   I have personally reviewed and evaluated these images and lab results as part of my medical decision-making.   EKG Interpretation None      MDM   Final diagnoses:  None   Filed Vitals:   05/06/16 0528 05/06/16 0530  BP: 200/79 211/67  Pulse: 73 49  Temp:    Resp:  16   Results for orders placed or performed during the hospital encounter of 05/01/16  CBC with Differential/Platelet  Result Value Ref Range   WBC 5.8 4.0 - 10.5 K/uL   RBC 4.61 3.87 - 5.11 MIL/uL   Hemoglobin 13.1 12.0 - 15.0 g/dL   HCT 39.2 36.0 - 46.0 %   MCV 85.0 78.0 - 100.0 fL   MCH 28.4 26.0 - 34.0 pg   MCHC 33.4 30.0 - 36.0 g/dL   RDW 13.5 11.5 - 15.5 %   Platelets 219 150 - 400 K/uL   Neutrophils Relative % 60 %   Neutro Abs 3.4 1.7 - 7.7 K/uL   Lymphocytes Relative 27 %   Lymphs Abs 1.6 0.7 - 4.0 K/uL   Monocytes Relative 10 %   Monocytes Absolute 0.6 0.1 - 1.0 K/uL   Eosinophils Relative 2 %   Eosinophils Absolute 0.1 0.0 - 0.7 K/uL   Basophils Relative 1 %   Basophils Absolute 0.0 0.0 - 0.1 K/uL  Comprehensive metabolic panel  Result Value Ref Range   Sodium 129 (L) 135 - 145 mmol/L   Potassium 3.1 (L) 3.5 - 5.1 mmol/L   Chloride 91 (L) 101 - 111 mmol/L   CO2 26 22 - 32 mmol/L   Glucose, Bld 91 65 - 99 mg/dL   BUN 12 6 - 20 mg/dL   Creatinine, Ser  0.86 0.44 - 1.00 mg/dL   Calcium 9.8 8.9 - 10.3 mg/dL   Total Protein 6.8 6.5 - 8.1  g/dL   Albumin 3.4 (L) 3.5 - 5.0 g/dL   AST 22 15 - 41 U/L   ALT 14 14 - 54 U/L   Alkaline Phosphatase 75 38 - 126 U/L   Total Bilirubin 0.6 0.3 - 1.2 mg/dL   GFR calc non Af Amer 59 (L) >60 mL/min   GFR calc Af Amer >60 >60 mL/min   Anion gap 12 5 - 15  APTT  Result Value Ref Range   aPTT 74 (H) 24 - 37 seconds  Heparin level (unfractionated)  Result Value Ref Range   Heparin Unfractionated 2.16 (H) 0.30 - 0.70 IU/mL  APTT  Result Value Ref Range   aPTT 64 (H) 24 - 37 seconds  Heparin level (unfractionated)  Result Value Ref Range   Heparin Unfractionated 1.48 (H) 0.30 - 0.70 IU/mL  CBC  Result Value Ref Range   WBC 5.7 4.0 - 10.5 K/uL   RBC 4.70 3.87 - 5.11 MIL/uL   Hemoglobin 13.4 12.0 - 15.0 g/dL   HCT 39.6 36.0 - 46.0 %   MCV 84.3 78.0 - 100.0 fL   MCH 28.5 26.0 - 34.0 pg   MCHC 33.8 30.0 - 36.0 g/dL   RDW 13.5 11.5 - 15.5 %   Platelets 223 150 - 400 K/uL  Troponin I-serum (0, 3, 6 hours)  Result Value Ref Range   Troponin I <0.03 <0.031 ng/mL  Troponin I-serum (0, 3, 6 hours)  Result Value Ref Range   Troponin I <0.03 <0.031 ng/mL  Magnesium  Result Value Ref Range   Magnesium 1.8 1.7 - 2.4 mg/dL  Lipid panel  Result Value Ref Range   Cholesterol 195 0 - 200 mg/dL   Triglycerides 49 <150 mg/dL   HDL 65 >40 mg/dL   Total CHOL/HDL Ratio 3.0 RATIO   VLDL 10 0 - 40 mg/dL   LDL Cholesterol 120 (H) 0 - 99 mg/dL  Hemoglobin A1c  Result Value Ref Range   Hgb A1c MFr Bld 6.2 (H) 4.8 - 5.6 %   Mean Plasma Glucose 131 mg/dL  CBC  Result Value Ref Range   WBC 5.9 4.0 - 10.5 K/uL   RBC 4.45 3.87 - 5.11 MIL/uL   Hemoglobin 12.9 12.0 - 15.0 g/dL   HCT 37.9 36.0 - 46.0 %   MCV 85.2 78.0 - 100.0 fL   MCH 29.0 26.0 - 34.0 pg   MCHC 34.0 30.0 - 36.0 g/dL   RDW 13.2 11.5 - 15.5 %   Platelets 240 150 - 400 K/uL  Heparin level (unfractionated)  Result Value Ref Range   Heparin  Unfractionated 1.10 (H) 0.30 - 0.70 IU/mL  APTT  Result Value Ref Range   aPTT 77 (H) 24 - 37 seconds  APTT  Result Value Ref Range   aPTT 191 (H) 24 - 37 seconds  Basic metabolic panel  Result Value Ref Range   Sodium 132 (L) 135 - 145 mmol/L   Potassium 3.7 3.5 - 5.1 mmol/L   Chloride 96 (L) 101 - 111 mmol/L   CO2 27 22 - 32 mmol/L   Glucose, Bld 98 65 - 99 mg/dL   BUN 9 6 - 20 mg/dL   Creatinine, Ser 0.92 0.44 - 1.00 mg/dL   Calcium 9.2 8.9 - 10.3 mg/dL   GFR calc non Af Amer 55 (L) >60 mL/min   GFR calc Af Amer >60 >60 mL/min   Anion gap 9 5 - 15  Protime-INR  Result Value Ref Range   Prothrombin  Time 14.2 11.6 - 15.2 seconds   INR 1.08 0.00 - 1.49  I-stat troponin, ED  Result Value Ref Range   Troponin i, poc 0.00 0.00 - 0.08 ng/mL   Comment 3           Dg Neck Soft Tissue  05/06/2016  CLINICAL DATA:  Foreign body sensation in throat. EXAM: NECK SOFT TISSUES - 1+ VIEW COMPARISON:  None. FINDINGS: There is no evidence of retropharyngeal soft tissue swelling or epiglottic enlargement. The cervical airway is unremarkable. No radio-opaque foreign body identified. Carotid vascular calcifications are seen. Degenerative change in the cervical spine. IMPRESSION: No radiopaque foreign body.  Cervical airway appears patent. Carotid vascular calcifications. Electronically Signed   By: Jeb Levering M.D.   On: 05/06/2016 05:32   Dg Chest 2 View  05/06/2016  CLINICAL DATA:  Foreign body sensation in throat. EXAM: CHEST  2 VIEW COMPARISON:  05/01/2016 FINDINGS: No radiopaque foreign body. The heart size and mediastinal contours are normal. Minimal left basilar atelectasis is unchanged previous right basilar atelectasis has improved. Patient rotation accounting for rightward appearance the tracheal air column, unchanged from priors. No pulmonary edema, focal consolidation, pleural effusion or pneumothorax. Remote right clavicle fracture and superior subluxation of the right humeral head  indicating chronic rotator cuff arthropathy. IMPRESSION: Left greater than right basilar atelectasis. No new abnormality is seen. Electronically Signed   By: Jeb Levering M.D.   On: 05/06/2016 05:35   Dg Chest 2 View  05/02/2016  CLINICAL DATA:  Acute onset of central chest pain and heaviness. Initial encounter. EXAM: CHEST  2 VIEW COMPARISON:  Chest radiograph performed 02/29/2016 FINDINGS: The lungs are well-aerated. Minimal bibasilar atelectasis is noted. There is no evidence of pleural effusion or pneumothorax. The heart is normal in size; the mediastinal contour is within normal limits. No acute osseous abnormalities are seen. There is a chronic displaced fracture of the right clavicle. Clips are noted within the right upper quadrant, reflecting prior cholecystectomy. IMPRESSION: Minimal bibasilar atelectasis noted.  Lungs otherwise clear. Electronically Signed   By: Garald Balding M.D.   On: 05/02/2016 00:12     No obstruction on xrays. Well appearing.  Lungs clear airway patent.  Throat clearing likely secondary to gerd.  Follow up with your PMD  I personally performed the services described in this documentation, which was scribed in my presence. The recorded information has been reviewed and is accurate.       Veatrice Kells, MD 05/06/16 [phone]

## 2016-05-06 NOTE — ED Notes (Addendum)
Patient presents with feeling like she is choking  Was seen by her PMD and started on Prilosec  States feels like she cannot get what ever is in her throat to go up or down

## 2016-05-06 NOTE — ED Notes (Signed)
Pt to xray

## 2016-05-06 NOTE — ED Notes (Signed)
Pt back from x-ray.

## 2016-05-07 ENCOUNTER — Telehealth: Payer: Self-pay

## 2016-05-07 DIAGNOSIS — R071 Chest pain on breathing: Secondary | ICD-10-CM | POA: Diagnosis not present

## 2016-05-07 NOTE — Telephone Encounter (Signed)
Let's have her come in to be seen again this week for this.  Some of it may be related to her memory loss, as well.

## 2016-05-07 NOTE — Telephone Encounter (Signed)
I supposed it is ok to use xanax until then.  Anxiety medications often make memory loss problems worse.  Let's find out the old dosage and frequency.

## 2016-05-07 NOTE — Telephone Encounter (Signed)
Daughter, [name], called with concerns about mother having severe anxiety attaqcks. Patient is constantly clearing throat because she feels like she is choking. Patient always feels like there is something wrong with her. Examples include: tingling all over, something stuck in throat, and overall weird feelings. Patient was taken to the ED on 05/06/16 because she felt like she was choking. While at ED, patient was treated for acid reflux. Xrays were taken of chest and neck to see if anything was lodged in throat or chest, but nothing was found. Patient was sent home with medications to control reflux. Once home, patient was afraid to be alone. She wanted daughter to lay in bed beside her because she felt that someone needed to be right beside her. Patient is calling all children saying that she needs someone to be with her all the time.   Daughter would like to know if there is some type of senior counseling or medication for anxiety.    Please advise.

## 2016-05-07 NOTE — Telephone Encounter (Signed)
Ok, xanax 0.5mg  po bid prn anxiety.  #30 no refills

## 2016-05-07 NOTE — Telephone Encounter (Signed)
The old xanax prescription was written for 0.5 mg twice daily. Daughter stated that they were not going to give patient xanax before the scheduled appointment unless patient needed it.

## 2016-05-07 NOTE — Telephone Encounter (Signed)
I attempted to call daughter back but I had to leave a message on voicemail for her to call the office.

## 2016-05-07 NOTE — Telephone Encounter (Signed)
Appointment was scheduled June 5 at 8:30 am due to no other appointment available this week.   Daughter would like to know if it is ok to give her mother xanax until the appointment. Patient has an old prescription for xanax but has not taken it since March 2017.   Please advise

## 2016-05-09 ENCOUNTER — Encounter: Payer: Self-pay | Admitting: Pharmacist

## 2016-05-09 ENCOUNTER — Ambulatory Visit (INDEPENDENT_AMBULATORY_CARE_PROVIDER_SITE_OTHER): Payer: Medicare Other | Admitting: Pharmacist

## 2016-05-09 VITALS — BP 154/68 | HR 62 | Wt 127.4 lb

## 2016-05-09 DIAGNOSIS — I10 Essential (primary) hypertension: Secondary | ICD-10-CM | POA: Diagnosis not present

## 2016-05-09 NOTE — Patient Instructions (Signed)
Return for a a follow up appointment in 2 months after visit with Dr. Loletha Grayer  Your blood pressure today is 154/68   Check your blood pressure at home daily (if able) and keep record of the readings.  Take your BP meds as follows: Start taking amlodipine 5mg  in the evening Continue to take all other medications as prescribed  Bring all of your meds, your BP cuff and your record of home blood pressures to your next appointment.  Exercise as you're able, try to walk approximately 30 minutes per day.  Keep salt intake to a minimum, especially watch canned and prepared boxed foods.  Eat more fresh fruits and vegetables and fewer canned items.  Avoid eating in fast food restaurants.    HOW TO TAKE YOUR BLOOD PRESSURE: . Rest 5 minutes before taking your blood pressure. .  Don't smoke or drink caffeinated beverages for at least 30 minutes before. . Take your blood pressure before (not after) you eat. . Sit comfortably with your back supported and both feet on the floor (don't cross your legs). . Elevate your arm to heart level on a table or a desk. . Use the proper sized cuff. It should fit smoothly and snugly around your bare upper arm. There should be enough room to slip a fingertip under the cuff. The bottom edge of the cuff should be 1 inch above the crease of the elbow. . Ideally, take 3 measurements at one sitting and record the average.

## 2016-05-09 NOTE — Progress Notes (Signed)
Patient ID: Brittney Gutierrez                 DOB: [birth year]                      MRN: [id number]     HPI: Brittney Gutierrez is a 82 y.o. female referred by Brittney Gutierrez to HTN clinic for evaluation. She has had several issues recently with chest pain, coughing, and blood pressure. Her daughter reports that she has also been experiencing a lot of acid reflux over the last few weeks.   Her daughter had multiple questions about medications and side effects. They both agree it is hard to attribute any symptoms to one particular medication as she has had several medication changes in the last few weeks. We discussed with the severity of acid reflux she has been experiencing that many of the above symptoms could be related to this (cough and chest pain). She has never been evaluated by a GI doctor. Her daughter also wonders if some of these symptoms may be anxiety related.  Current HTN meds:   amlodipine 5mg  daily Hydrochlorothiazide 12.5mg  daily Losartan 100mg  daily Metoprolol 25mg  BID Previously tried:  Hydralazine - stopped due to tingling Lisinopril - stopped recently due to dry cough  BP goal: <150/90  Family History: no cardiac history in parents and she is not aware of cardiac issues in her sister.   Social History: never smoker, no smokeless tobacco or alcohol.  Diet: eats all of her meals from [facility]. Does not add salt to foods. She has one cup of coffee a day. She does not drink any other caffeine during the day.   Exercise: no exercise right now  Home BP readings:   Wt Readings from Last 3 Encounters:  05/06/16 129 lb 8 oz (58.741 kg)  05/03/16 127 lb 6.4 oz (57.788 kg)  04/29/16 130 lb 6.4 oz (59.149 kg)   BP Readings from Last 3 Encounters:  05/06/16 211/67  05/03/16 120/91  04/29/16 200/90   Pulse Readings from Last 3 Encounters:  05/06/16 49  05/03/16 54  04/29/16 58    Renal function: Estimated Creatinine Clearance: 34.7 mL/min (by C-G formula based on  Cr of 0.92).  Past Medical History  Diagnosis Date  . Paroxysmal a-fib ([place])   . Hypertension   . Formed visual hallucinations     [name]?  Failed Keppra and Depakote  . Macular degeneration   . Diverticulitis 09/28/2007  . TIA (transient ischemic attack) 2000s?  [name] Neurocysticercosis     Craniotomy at [facility] in early 2000s  . Depression 09/06/2002  . UTI (urinary tract infection)   . Paranoia ([place])   . Bradycardia, drug induced   . Leg pain, left   . Pure hypercholesterolemia 12/20/1999  . Anxiety 01/17/2003  . Cerebral atherosclerosis 09/24/1999  . Back pain 08/19/2005    with radiculopathy  . Chest pain, atypical     cath 05/03/2016 for recurrent CP, mild nonobstructive dx. Mild to moderate R renal artery stenosis, patent L renal artery  . Diverticulosis of colon 06/07/2003  . External hemorrhoids 04/12/2009  . Malaise and fatigue   . Osteoporosis 05/28/2004  . PSVT (paroxysmal supraventricular tachycardia) ([place]) 02/22/2002  . Palpitations 11/16/2001  . Female climacteric state 03/04/2000  . Abnormal weight loss 05/28/2004  . Urinary incontinence 08/18/2007  . Vaginitis, atrophic   . Vertigo, peripheral 10/07/2000  . Hallucination, visual   . GERD (gastroesophageal reflux disease)   .  Osteoarthrosis, localized   . Arthritis     "a little; qwhere" (5/25/20170  . Dementia     "don't know kind or stage" (05/02/2016)  . Hypertension     cath 05/03/2016 Mild to moderate R renal artery stenosis, patent L renal artery    Current Outpatient Prescriptions on File Prior to Visit  Medication Sig Dispense Refill  . acetaminophen (TYLENOL) 325 MG tablet Take 650 mg by mouth every 6 (six) hours as needed for mild pain.    [name] amLODipine (NORVASC) 5 MG tablet Take 1 tablet (5 mg total) by mouth daily. 90 tablet 3  . apixaban (ELIQUIS) 2.5 MG TABS tablet Take 1 tablet (2.5 mg total) by mouth 2 (two) times daily. 60 tablet 6  . atorvastatin (LIPITOR) 40 MG tablet Take 1 tablet (40 mg total)  by mouth daily at 6 PM. 90 tablet 3  . calcium carbonate (TUMS - DOSED IN MG ELEMENTAL CALCIUM) 500 MG chewable tablet Chew 1 tablet by mouth 2 (two) times daily as needed for indigestion or heartburn.    . docusate sodium (COLACE) 100 MG capsule Take 100 mg by mouth 2 (two) times daily.    [name] donepezil (ARICEPT) 10 MG tablet Take 1 tablet (10 mg total) by mouth daily. 30 tablet 2  . hydrochlorothiazide (MICROZIDE) 12.5 MG capsule Take 1 capsule (12.5 mg total) by mouth daily. 90 capsule 3  . losartan (COZAAR) 100 MG tablet Take 1 tablet (100 mg total) by mouth daily. 90 tablet 3  . Melatonin 3 MG CAPS Take 1 capsule (3 mg total) by mouth at bedtime as needed. (Patient taking differently: Take 3 mg by mouth at bedtime. )  0  . metoprolol tartrate (LOPRESSOR) 25 MG tablet Take 25 mg by mouth 2 (two) times daily. Hold if pulse goes below 45    . omeprazole (PRILOSEC) 20 MG capsule Take 1 capsule (20 mg total) by mouth daily. 30 capsule 3  . sucralfate (CARAFATE) 1 GM/10ML suspension Take 10 mLs (1 g total) by mouth 4 (four) times daily -  with meals and at bedtime. 420 mL 0  . tobramycin (TOBREX) 0.3 % ophthalmic solution Place 1 drop into the left eye See admin instructions. Use drops when receiving macular degeneration shot into left eye four times a day for several days    . venlafaxine (EFFEXOR) 37.5 MG tablet Take 37.5 mg by mouth 2 (two) times daily.     No current facility-administered medications on file prior to visit.    Allergies  Allergen Reactions  . Amiodarone Itching  . Amoxicillin Other (See Comments)    Unknown- ? Upset stomach  . Demerol [Meperidine] Other (See Comments)    Hallucinations  . Tape Rash    Can tear the skin, also     Assessment/Plan: Hypertension: BP is not at goal but improved from last few visits. Will change amlodipine dose to the evening to see if we can gain added control by spacing medications throughout the day. I also encouraged her and her daughter  to discuss a GI evaluation at her upcoming primary care visit as I believe her symptoms are likely related to her acid reflux given her description of symptoms and less likely related to the cardiac medications. Will wait to make additional medication changes at this time. Follow up in hypertension clinic in 2 months after visit with Dr. Loletha Grayer.   Thank you, Lelan Pons. [place], [place]

## 2016-05-10 NOTE — Progress Notes (Signed)
Thanks

## 2016-05-13 ENCOUNTER — Other Ambulatory Visit: Payer: Self-pay | Admitting: *Deleted

## 2016-05-13 ENCOUNTER — Ambulatory Visit (INDEPENDENT_AMBULATORY_CARE_PROVIDER_SITE_OTHER): Payer: Medicare Other | Admitting: Internal Medicine

## 2016-05-13 ENCOUNTER — Encounter: Payer: Self-pay | Admitting: Internal Medicine

## 2016-05-13 VITALS — BP 120/70 | HR 72 | Temp 98.0°F | Wt 129.0 lb

## 2016-05-13 DIAGNOSIS — R0789 Other chest pain: Secondary | ICD-10-CM

## 2016-05-13 DIAGNOSIS — F324 Major depressive disorder, single episode, in partial remission: Secondary | ICD-10-CM

## 2016-05-13 DIAGNOSIS — I248 Other forms of acute ischemic heart disease: Secondary | ICD-10-CM

## 2016-05-13 DIAGNOSIS — F03918 Unspecified dementia, unspecified severity, with other behavioral disturbance: Secondary | ICD-10-CM

## 2016-05-13 DIAGNOSIS — K219 Gastro-esophageal reflux disease without esophagitis: Secondary | ICD-10-CM

## 2016-05-13 DIAGNOSIS — F0391 Unspecified dementia with behavioral disturbance: Secondary | ICD-10-CM | POA: Diagnosis not present

## 2016-05-13 MED ORDER — VENLAFAXINE HCL 37.5 MG PO TABS: 37.5000 mg | ORAL_TABLET | Freq: Two times a day (BID) | ORAL | Status: DC

## 2016-05-13 MED ORDER — DONEPEZIL HCL 10 MG PO TABS: 10.0000 mg | ORAL_TABLET | Freq: Every day | ORAL | Status: DC

## 2016-05-13 MED ORDER — MIRTAZAPINE 7.5 MG PO TABS: 7.5000 mg | ORAL_TABLET | Freq: Every day | ORAL | Status: DC

## 2016-05-13 MED ORDER — OMEPRAZOLE 20 MG PO CPDR: 20.0000 mg | DELAYED_RELEASE_CAPSULE | Freq: Two times a day (BID) | ORAL | Status: DC

## 2016-05-13 NOTE — Patient Instructions (Addendum)
Use topical otc cream like theragesic on right chest wall.  Take carafate as needed for reflux attacks.  Use prilosec twice a day before breakfast and supper  Start mirtazapine 7.5mg  at bedtime for depression/anxiety/sleep/appetite.  Please try to be more social.  I also recommend you see a counselor to help with your adjustment.

## 2016-05-13 NOTE — Progress Notes (Signed)
Location:  [facility] Provider:  Ahria Slappey L. Mariea Clonts, D.O., C.M.D.  Code Status: DNR  Goals of Care:  Advanced Directives 05/06/2016  Does patient have an advance directive? Yes  Type of Advance Directive [place]  Does patient want to make changes to advanced directive? -  Copy of advanced directive(s) in chart? -     Chief Complaint  Patient presents with  . Medical Management of Chronic Issues    ER follow-up    HPI: Patient is a 82 y.o. female seen today for ER f/u.  She was hospitalized 2 wks ago and had a heart cath for chest pains.  Some plaque was noted and she was started on lipitor and amlodipine.  She was discharged home that Friday.  Was doing ok, but slept 14 hrs.  Saturday, she did well until she got her morning medications.  She started with throat clearing, burping.  It went on for hours.  Her daughter called our PA on call.  He advised she check with the pharmacy and get cough syrup.  She did well through dinner.  Then had evening meds and symptoms returned.  The next morning, she was fine, and her daughter decided to give meds gradually to tell which one was a problem.  Had gotten all am meds except metoprolol.  Called [place] and we advised not to given metoprolol due to the symptoms she correlated with that.  I also advised her to give the prilosec as the ED had advised before breakfast daily not just as needed (of note, we had stopped her ranitidine to decrease pill burden when she'd been asymptomatic with her GERD for some time).  She did well then until she ate ice cream on Sunday which made her miserable again.  She also at that point had not had bm since Thursday.  Enema was given and then had frequent bms and got weak.    Sunday evening, she went to bed at 9pm. She woke up choking and went to ED.  She's still not feeling well.  Was given a GI cocktail.  Had a neck and chest xray.  Diagnosed again with acid reflux and prescribed carafate QID.  She was then  having right chest pain and waking up coughing, turning her body to reach her water and drink it.  Her daughter gave her water and tylenol and seemed better.    Remains quite anxious.  She's overall had a terrible past month and has been calling her daughters from [facility] [place] crying.  She'd been on xanax 19+ years and was weaned successfully at the end of February.  Her daughter added 1/2 tab bid for the past 3 days.  Patient is concerned about her memory being worse, can't get her thoughts out. Trouble remembering people's names.  She is still trying to learn to like where she is.  She had previously been in her home for 86 yrs.  She is trying to socialize more.    She is having constipation and diarrhea alternating while taking stool softener, but then daughters realized she was taking prunes and prune juice also.    Past Medical History  Diagnosis Date  . Paroxysmal a-fib ([place])   . Hypertension   . Formed visual hallucinations     [name]?  Failed Keppra and Depakote  . Macular degeneration   . Diverticulitis 09/28/2007  . TIA (transient ischemic attack) 2000s?  [name] Neurocysticercosis     Craniotomy at [facility] in early  2000s  . Depression 09/06/2002  . UTI (urinary tract infection)   . Paranoia ([place])   . Bradycardia, drug induced   . Leg pain, left   . Pure hypercholesterolemia 12/20/1999  . Anxiety 01/17/2003  . Cerebral atherosclerosis 09/24/1999  . Back pain 08/19/2005    with radiculopathy  . Chest pain, atypical     cath 05/03/2016 for recurrent CP, mild nonobstructive dx. Mild to moderate R renal artery stenosis, patent L renal artery  . Diverticulosis of colon 06/07/2003  . External hemorrhoids 04/12/2009  . Malaise and fatigue   . Osteoporosis 05/28/2004  . PSVT (paroxysmal supraventricular tachycardia) ([place]) 02/22/2002  . Palpitations 11/16/2001  . Female climacteric state 03/04/2000  . Abnormal weight loss 05/28/2004  . Urinary incontinence 08/18/2007  . Vaginitis,  atrophic   . Vertigo, peripheral 10/07/2000  . Hallucination, visual   . GERD (gastroesophageal reflux disease)   . Osteoarthrosis, localized   . Arthritis     "a little; qwhere" (5/25/20170  . Dementia     "don't know kind or stage" (05/02/2016)  . Hypertension     cath 05/03/2016 Mild to moderate R renal artery stenosis, patent L renal artery    Past Surgical History  Procedure Laterality Date  . Shoulder arthroscopy w/ rotator cuff repair Right X 3  . Total knee arthroplasty Left 1990  . Cataract extraction  2013  . Vaginal hysterectomy  1960  . Joint replacement    . Brain surgery  2000    Neurocysticercosis ("parasite")  . Cataract extraction, bilateral      "not sure if both eyes; feel like it probably was"  . Cardiac catheterization N/A 05/03/2016    Procedure: Left Heart Cath and Coronary Angiography;  Surgeon: Sherren Mocha, MD;  Location: [facility];  Service: Cardiovascular;  Laterality: N/A;  . Peripheral vascular catheterization N/A 05/03/2016    Procedure: Abdominal Aortogram;  Surgeon: Sherren Mocha, MD;  Location: [facility];  Service: Cardiovascular;  Laterality: N/A;    Allergies  Allergen Reactions  . Amiodarone Itching  . Amoxicillin Other (See Comments)    Unknown- ? Upset stomach  . Demerol [Meperidine] Other (See Comments)    Hallucinations  . Tape Rash    Can tear the skin, also      Medication List       This list is accurate as of: 05/13/16  9:22 AM.  Always use your most recent med list.               acetaminophen 325 MG tablet  Commonly known as:  TYLENOL  Take 650 mg by mouth every 6 (six) hours as needed for mild pain.     amLODipine 5 MG tablet  Commonly known as:  NORVASC  Take 1 tablet (5 mg total) by mouth daily.     apixaban 2.5 MG Tabs tablet  Commonly known as:  ELIQUIS  Take 1 tablet (2.5 mg total) by mouth 2 (two) times daily.     atorvastatin 40 MG tablet  Commonly known as:  LIPITOR  Take 1 tablet  (40 mg total) by mouth daily at 6 PM.     calcium carbonate 500 MG chewable tablet  Commonly known as:  TUMS - dosed in mg elemental calcium  Chew 1 tablet by mouth 2 (two) times daily as needed for indigestion or heartburn.     docusate sodium 100 MG capsule  Commonly known as:  COLACE  Take 100 mg by mouth 2 (  two) times daily.     donepezil 10 MG tablet  Commonly known as:  ARICEPT  Take 1 tablet (10 mg total) by mouth daily.     hydrochlorothiazide 12.5 MG capsule  Commonly known as:  MICROZIDE  Take 1 capsule (12.5 mg total) by mouth daily.     losartan 100 MG tablet  Commonly known as:  COZAAR  Take 1 tablet (100 mg total) by mouth daily.     Melatonin 3 MG Caps  Take by mouth at bedtime.     metoprolol tartrate 25 MG tablet  Commonly known as:  LOPRESSOR  Take 25 mg by mouth 2 (two) times daily. Hold if pulse goes below 45     omeprazole 20 MG capsule  Commonly known as:  PRILOSEC  Take 1 capsule (20 mg total) by mouth daily.     sucralfate 1 GM/10ML suspension  Commonly known as:  CARAFATE  Take 10 mLs (1 g total) by mouth 4 (four) times daily -  with meals and at bedtime.     tobramycin 0.3 % ophthalmic solution  Commonly known as:  TOBREX  Place 1 drop into the left eye See admin instructions. Use drops when receiving macular degeneration shot into left eye four times a day for several days     venlafaxine 37.5 MG tablet  Commonly known as:  EFFEXOR  Take 37.5 mg by mouth 2 (two) times daily.        Review of Systems:  Review of Systems  Constitutional: Negative for fever, chills and malaise/fatigue.  HENT: Positive for hearing loss. Negative for congestion and sore throat.   Eyes: Positive for blurred vision.       Advanced macular degeneration  Respiratory: Positive for cough. Negative for shortness of breath, wheezing and stridor.   Cardiovascular: Positive for chest pain. Negative for palpitations.       Right sided muscular chest pain    Gastrointestinal: Positive for heartburn. Negative for nausea, vomiting, abdominal pain, diarrhea, constipation, blood in stool and melena.  Genitourinary: Negative for dysuria, urgency and frequency.  Musculoskeletal: Positive for myalgias. Negative for falls.  Skin: Negative for rash.  Neurological: Negative for dizziness, loss of consciousness and weakness.  Psychiatric/Behavioral: Positive for memory loss. Negative for depression and hallucinations. The patient is nervous/anxious.     Health Maintenance  Topic Date Due  . ZOSTAVAX  11/10/1989  . DEXA SCAN  11/10/1994  . INFLUENZA VACCINE  07/09/2016  . TETANUS/TDAP  05/14/2022  . PNA vac Low Risk Adult  Completed    Physical Exam: Filed Vitals:   05/13/16 0842  BP: 120/70  Pulse: 72  Temp: 98 F (36.7 C)  TempSrc: Oral  Weight: 129 lb (58.514 kg)  SpO2: 98%   Body mass index is 23.59 kg/(m^2). Physical Exam  Constitutional: She is oriented to person, place, and time. She appears well-developed and well-nourished. No distress.  Cardiovascular: Normal rate, regular rhythm, normal heart sounds and intact distal pulses.   Pulmonary/Chest: Effort normal and breath sounds normal. No respiratory distress.  Abdominal: Soft. Bowel sounds are normal. She exhibits no distension and no mass. There is no tenderness.  Musculoskeletal: Normal range of motion. She exhibits tenderness.  Tender over right upper pectoralis muscle and into shoulder  Neurological: She is alert and oriented to person, place, and time.  Skin: Skin is warm and dry.  Psychiatric: She has a normal mood and affect.    Labs reviewed: Basic Metabolic Panel:  Recent Labs  12/26/15 1536  01/16/16 1058  03/01/16 0330  04/29/16 1528 05/02/16 0025 05/02/16 0732 05/03/16 0633  NA 139  < > 139  < > 135  < > 133* 129*  --  132*  K 3.0*  < > 4.3  < > 4.1  < > 3.9 3.1*  --  3.7  CL 98*  < > 98  < > 102  < > 94* 91*  --  96*  CO2 28  < > 22  < > 23  --  28 26   --  27  GLUCOSE 100*  < > 80  < > 124*  < > 78 91  --  98  BUN 12  < > 18  < > 22*  < > 14 12  --  9  CREATININE 0.89  < > 0.73  < > 0.92  < > 0.84 0.86  --  0.92  CALCIUM 9.5  < > 9.3  < > 9.0  --  9.6 9.8  --  9.2  MG 1.8  --   --   --  1.8  --   --   --  1.8  --   TSH  --   --  6.250*  --  4.447  --   --   --   --   --   < > = values in this interval not displayed. Liver Function Tests:  Recent Labs  01/16/16 1058 03/01/16 0330 05/02/16 0025  AST 14 25 22   ALT 15 27 14   ALKPHOS 114 91 75  BILITOT <0.2 0.7 0.6  PROT 7.1 6.5 6.8  ALBUMIN 3.9 3.0* 3.4*   No results for input(s): LIPASE, AMYLASE in the last 8760 hours. No results for input(s): AMMONIA in the last 8760 hours. CBC:  Recent Labs  01/16/16 1058  05/02/16 0025 05/02/16 0732 05/03/16 0130  WBC 4.9  < > 5.8 5.7 5.9  NEUTROABS 3.1  --  3.4  --   --   HGB  --   < > 13.1 13.4 12.9  HCT 35.9  < > 39.2 39.6 37.9  MCV 87  < > 85.0 84.3 85.2  PLT 266  < > 219 223 240  < > = values in this interval not displayed. Lipid Panel:  Recent Labs  05/02/16 0732  CHOL 195  HDL 65  LDLCALC 120*  TRIG 49  CHOLHDL 3.0   Lab Results  Component Value Date   HGBA1C 6.2* 05/02/2016    Procedures since last visit: Dg Neck Soft Tissue  05/06/2016  CLINICAL DATA:  Foreign body sensation in throat. EXAM: NECK SOFT TISSUES - 1+ VIEW COMPARISON:  None. FINDINGS: There is no evidence of retropharyngeal soft tissue swelling or epiglottic enlargement. The cervical airway is unremarkable. No radio-opaque foreign body identified. Carotid vascular calcifications are seen. Degenerative change in the cervical spine. IMPRESSION: No radiopaque foreign body.  Cervical airway appears patent. Carotid vascular calcifications. Electronically Signed   By: Jeb Levering M.D.   On: 05/06/2016 05:32   Dg Chest 2 View  05/06/2016  CLINICAL DATA:  Foreign body sensation in throat. EXAM: CHEST  2 VIEW COMPARISON:  05/01/2016 FINDINGS: No  radiopaque foreign body. The heart size and mediastinal contours are normal. Minimal left basilar atelectasis is unchanged previous right basilar atelectasis has improved. Patient rotation accounting for rightward appearance the tracheal air column, unchanged from priors. No pulmonary edema, focal consolidation, pleural effusion or pneumothorax. Remote right clavicle fracture and superior  subluxation of the right humeral head indicating chronic rotator cuff arthropathy. IMPRESSION: Left greater than right basilar atelectasis. No new abnormality is seen. Electronically Signed   By: Jeb Levering M.D.   On: 05/06/2016 05:35   Dg Chest 2 View  05/02/2016  CLINICAL DATA:  Acute onset of central chest pain and heaviness. Initial encounter. EXAM: CHEST  2 VIEW COMPARISON:  Chest radiograph performed 02/29/2016 FINDINGS: The lungs are well-aerated. Minimal bibasilar atelectasis is noted. There is no evidence of pleural effusion or pneumothorax. The heart is normal in size; the mediastinal contour is within normal limits. No acute osseous abnormalities are seen. There is a chronic displaced fracture of the right clavicle. Clips are noted within the right upper quadrant, reflecting prior cholecystectomy. IMPRESSION: Minimal bibasilar atelectasis noted.  Lungs otherwise clear. Electronically Signed   By: Garald Balding M.D.   On: 05/02/2016 00:12    Assessment/Plan 1. Dementia with behavioral disturbance - cont aricept and off risperdal--no further hallucinations - donepezil (ARICEPT) 10 MG tablet; Take 1 tablet (10 mg total) by mouth daily.  Dispense: 90 tablet; Refill: 3  2. Major depressive disorder with single episode, in partial remission ([place]) - depression and anxiety worse and may even be causing some of her symptoms - add mirtazapine 7.5mg  po qhs for depression, appetite, and sleep - venlafaxine (EFFEXOR) 37.5 MG tablet; Take 1 tablet (37.5 mg total) by mouth 2 (two) times daily.  Dispense: 180  tablet; Refill: 3  3. Gastroesophageal reflux disease without esophagitis - advised to use the prilosec twice a day before breakfast and supper - only use carafate between meals and not with prilosec--daughters decided to use as needed - omeprazole (PRILOSEC) 20 MG capsule; Take 1 capsule (20 mg total) by mouth 2 (two) times daily before a meal.  Dispense: 60 capsule; Refill: 5  4. Right-sided chest wall pain -seems to be musculoskeletal as reproducible on exam -advised to use topical theragesic cream on the area at bedtime and as needed for this discomfort; also has prior rotator cuff injury and loss of ROM of right shoulder  Labs/tests ordered:  No orders of the defined types were placed in this encounter.   Next appt:  07/08/2016   Jeylin Woodmansee L. Almin Livingstone, D.O. [facility] [address], [place] [address] Cell Phone (Mon-Fri 8am-5pm):  [phone] On Call:  [phone] & follow prompts after 5pm & weekends Office Phone:  [phone] Office Fax:  [phone]

## 2016-05-13 NOTE — Telephone Encounter (Signed)
Patient daughter requested Rx to be resent to pharmacy. [facility] stated they never received. Refaxed.

## 2016-05-17 ENCOUNTER — Telehealth: Payer: Self-pay

## 2016-05-17 DIAGNOSIS — F324 Major depressive disorder, single episode, in partial remission: Secondary | ICD-10-CM

## 2016-05-17 NOTE — Telephone Encounter (Signed)
Left message on voicemail for patient to return call when available, I informed patient's daughter to take patient to urgent care for an evaluation due to weekend and no return call on what symptoms patient experiencing.  Message forwarded to patient's PCP as a FYI

## 2016-05-17 NOTE — Telephone Encounter (Signed)
Message left on triage voicemail: Possible reaction to mirtazapine, please call to discuss  Left message on voicemail for patient to return call when available

## 2016-05-20 MED ORDER — MIRTAZAPINE 7.5 MG PO TABS: ORAL_TABLET | ORAL | Status: DC

## 2016-05-20 NOTE — Telephone Encounter (Signed)
Patient daughter notified and agreed. Also stated that patient is complaining about being constipated. Daughter giving her 2 stool softners a day (1 in the morning and 1 at night) Is there something else she can do? Please Advise.

## 2016-05-20 NOTE — Telephone Encounter (Signed)
Use miralax every other day.

## 2016-05-20 NOTE — Telephone Encounter (Signed)
Brittney Gutierrez, daughter called back stating that patient is having an adverse reaction to Mirtazapine. Stated that she started this medication last Thursday. Had bad dreams and refused to take it. Daughter wants to know if its ok to cut the tablet in half and try just a half. Please Advise.

## 2016-05-20 NOTE — Telephone Encounter (Signed)
Ok to try just 1/2 pill.

## 2016-05-21 NOTE — Telephone Encounter (Signed)
Discussed with [name]. [name] will buy miralax and take it to her mother today

## 2016-05-27 ENCOUNTER — Telehealth: Payer: Self-pay | Admitting: *Deleted

## 2016-05-27 DIAGNOSIS — M79672 Pain in left foot: Secondary | ICD-10-CM | POA: Diagnosis not present

## 2016-05-27 DIAGNOSIS — M216X9 Other acquired deformities of unspecified foot: Secondary | ICD-10-CM | POA: Diagnosis not present

## 2016-05-27 DIAGNOSIS — D2372 Other benign neoplasm of skin of left lower limb, including hip: Secondary | ICD-10-CM | POA: Diagnosis not present

## 2016-05-27 NOTE — Telephone Encounter (Signed)
Patient daughter, Brittney Gutierrez called and stated that patient is not tolerating the Mirtazapine well. Stated that she is having bad dreams and not sleeping well at night. Please Advise. Would like medication changed. Please Advise.

## 2016-05-29 DIAGNOSIS — H43813 Vitreous degeneration, bilateral: Secondary | ICD-10-CM | POA: Diagnosis not present

## 2016-05-29 DIAGNOSIS — H353221 Exudative age-related macular degeneration, left eye, with active choroidal neovascularization: Secondary | ICD-10-CM | POA: Diagnosis not present

## 2016-05-29 DIAGNOSIS — H353213 Exudative age-related macular degeneration, right eye, with inactive scar: Secondary | ICD-10-CM | POA: Diagnosis not present

## 2016-05-31 NOTE — Telephone Encounter (Signed)
She was previously having the dreams when she first started on aricept.  It was held for a while and they went away, then aricept was restarted.  She's been on it again for several months, but I'd be more suspicious of aricept and nightmares than the mirtazapine which typically helps with sleep.  She could also be hallucinating again (she was on risperdal for that when I first met her).

## 2016-05-31 NOTE — Telephone Encounter (Signed)
LMOM to return call.

## 2016-06-13 DIAGNOSIS — M79672 Pain in left foot: Secondary | ICD-10-CM | POA: Diagnosis not present

## 2016-06-13 DIAGNOSIS — B079 Viral wart, unspecified: Secondary | ICD-10-CM | POA: Diagnosis not present

## 2016-06-14 ENCOUNTER — Ambulatory Visit (INDEPENDENT_AMBULATORY_CARE_PROVIDER_SITE_OTHER): Payer: Medicare Other | Admitting: Cardiovascular Disease

## 2016-06-14 ENCOUNTER — Encounter: Payer: Self-pay | Admitting: Cardiovascular Disease

## 2016-06-14 VITALS — BP 121/64 | HR 62 | Ht 62.0 in | Wt 131.2 lb

## 2016-06-14 DIAGNOSIS — I4891 Unspecified atrial fibrillation: Secondary | ICD-10-CM

## 2016-06-14 DIAGNOSIS — I495 Sick sinus syndrome: Secondary | ICD-10-CM | POA: Diagnosis not present

## 2016-06-14 DIAGNOSIS — Z7901 Long term (current) use of anticoagulants: Secondary | ICD-10-CM

## 2016-06-14 DIAGNOSIS — I248 Other forms of acute ischemic heart disease: Secondary | ICD-10-CM | POA: Diagnosis not present

## 2016-06-14 DIAGNOSIS — I10 Essential (primary) hypertension: Secondary | ICD-10-CM | POA: Diagnosis not present

## 2016-06-14 MED ORDER — APIXABAN 2.5 MG PO TABS: 2.5000 mg | ORAL_TABLET | Freq: Two times a day (BID) | ORAL | Status: DC

## 2016-06-14 MED ORDER — LOSARTAN POTASSIUM 100 MG PO TABS: 100.0000 mg | ORAL_TABLET | Freq: Every day | ORAL | Status: DC

## 2016-06-14 MED ORDER — AMLODIPINE BESYLATE 5 MG PO TABS: 5.0000 mg | ORAL_TABLET | Freq: Every day | ORAL | Status: DC

## 2016-06-14 MED ORDER — HYDROCHLOROTHIAZIDE 12.5 MG PO CAPS: 12.5000 mg | ORAL_CAPSULE | Freq: Every day | ORAL | Status: DC

## 2016-06-14 MED ORDER — ATORVASTATIN CALCIUM 40 MG PO TABS: 40.0000 mg | ORAL_TABLET | Freq: Every day | ORAL | Status: DC

## 2016-06-14 NOTE — Progress Notes (Signed)
Patient ID: Brittney Gutierrez, female   DOB: 30-Aug-1927, 82 y.o.   MRN: [id number]    Cardiology Office Note    Date:  06/16/2016   ID:  Brittney Gutierrez, DOB 1927-08-25, MRN [id number]  PCP:  Lauree Chandler, NP  Cardiologist:   Sanda Klein, MD   Chief Complaint  Patient presents with  . Follow-up    History of Present Illness:  Brittney Gutierrez is a 82 y.o. female with systemic hypertension and problems with both sinus bradycardia and paroxysmal atrial fibrillation with rapid ventricular response. We are trying to adjust her medications and she wishes to avoid pacemaker implantation if possible.   She had a fall at home roughly 11 days before this appointment. She tripped in her laundry room. She has a sizable ecchymosis in the left periorbital area and a small hematoma to receive external coronary her eyebrow. She has not had any neurological complaints are headaches and did not seek medical attention after the fall. She is anticoagulated with Eliquis. She has not had previous falls.  She denies any neurological complaints and has not had overt external bleeding. She has had 3 episodes of palpitations due to atrial fibrillation so far this year. Her blood pressure control is now much improved. She has evidence of hypertensive heart disease with left ventricular hypertrophy but has not had overt congestive heart failure.  She has standing orders to withhold metoprolol if her heart rate is less than 45 but this has not occurred.  She has a questionable hx of TIA, she has a CHA2DS2Vasc score of at least 4 (if + TIA would be 6).  Myoview in Sept 2016 showed no ischemia. Echo shoed preserved LVF.   Amiodarone was used briefly but was discontinued due to itching. Simultaneous treatment with metoprolol and diltiazem was associated with significant bradycardia. She is currently on metoprolol monotherapy.  Past Medical History  Diagnosis Date  . Paroxysmal a-fib ([place])   . Hypertension   .  Formed visual hallucinations     [name]?  Failed Keppra and Depakote  . Macular degeneration   . Diverticulitis 09/28/2007  . TIA (transient ischemic attack) 2000s?  [name] Neurocysticercosis     Craniotomy at [facility] in early 2000s  . Depression 09/06/2002  . UTI (urinary tract infection)   . Paranoia ([place])   . Bradycardia, drug induced   . Leg pain, left   . Pure hypercholesterolemia 12/20/1999  . Anxiety 01/17/2003  . Cerebral atherosclerosis 09/24/1999  . Back pain 08/19/2005    with radiculopathy  . Chest pain, atypical     cath 05/03/2016 for recurrent CP, mild nonobstructive dx. Mild to moderate R renal artery stenosis, patent L renal artery  . Diverticulosis of colon 06/07/2003  . External hemorrhoids 04/12/2009  . Malaise and fatigue   . Osteoporosis 05/28/2004  . PSVT (paroxysmal supraventricular tachycardia) ([place]) 02/22/2002  . Palpitations 11/16/2001  . Female climacteric state 03/04/2000  . Abnormal weight loss 05/28/2004  . Urinary incontinence 08/18/2007  . Vaginitis, atrophic   . Vertigo, peripheral 10/07/2000  . Hallucination, visual   . GERD (gastroesophageal reflux disease)   . Osteoarthrosis, localized   . Arthritis     "a little; qwhere" (5/25/20170  . Dementia     "don't know kind or stage" (05/02/2016)  . Hypertension     cath 05/03/2016 Mild to moderate R renal artery stenosis, patent L renal artery    Past Surgical History  Procedure Laterality Date  . Shoulder arthroscopy w/ rotator cuff  repair Right X 3  . Total knee arthroplasty Left 1990  . Cataract extraction  2013  . Vaginal hysterectomy  1960  . Joint replacement    . Brain surgery  2000    Neurocysticercosis ("parasite")  . Cataract extraction, bilateral      "not sure if both eyes; feel like it probably was"  . Cardiac catheterization N/A 05/03/2016    Procedure: Left Heart Cath and Coronary Angiography;  Surgeon: Sherren Mocha, MD;  Location: [place] [facility];  Service: Cardiovascular;   Laterality: N/A;  . Peripheral vascular catheterization N/A 05/03/2016    Procedure: Abdominal Aortogram;  Surgeon: Sherren Mocha, MD;  Location: [place] [facility];  Service: Cardiovascular;  Laterality: N/A;    Current Medications: Outpatient Prescriptions Prior to Visit  Medication Sig Dispense Refill  . acetaminophen (TYLENOL) 325 MG tablet Take 650 mg by mouth every 6 (six) hours as needed for mild pain.    . calcium carbonate (TUMS - DOSED IN MG ELEMENTAL CALCIUM) 500 MG chewable tablet Chew 1 tablet by mouth 2 (two) times daily as needed for indigestion or heartburn.    . docusate sodium (COLACE) 100 MG capsule Take 100 mg by mouth 2 (two) times daily.    [name] donepezil (ARICEPT) 10 MG tablet Take 1 tablet (10 mg total) by mouth daily. 90 tablet 3  . Melatonin 3 MG CAPS Take by mouth at bedtime.    . metoprolol tartrate (LOPRESSOR) 25 MG tablet Take 25 mg by mouth 2 (two) times daily. Hold if pulse goes below 45    . omeprazole (PRILOSEC) 20 MG capsule Take 1 capsule (20 mg total) by mouth 2 (two) times daily before a meal. 60 capsule 5  . polyethylene glycol (MIRALAX / GLYCOLAX) packet Take 17 g by mouth every other day.    . tobramycin (TOBREX) 0.3 % ophthalmic solution Place 1 drop into the left eye See admin instructions. Use drops when receiving macular degeneration shot into left eye four times a day for several days    . venlafaxine (EFFEXOR) 37.5 MG tablet Take 1 tablet (37.5 mg total) by mouth 2 (two) times daily. 180 tablet 3  . amLODipine (NORVASC) 5 MG tablet Take 1 tablet (5 mg total) by mouth daily. 90 tablet 3  . apixaban (ELIQUIS) 2.5 MG TABS tablet Take 1 tablet (2.5 mg total) by mouth 2 (two) times daily. 60 tablet 6  . atorvastatin (LIPITOR) 40 MG tablet Take 1 tablet (40 mg total) by mouth daily at 6 PM. 90 tablet 3  . hydrochlorothiazide (MICROZIDE) 12.5 MG capsule Take 1 capsule (12.5 mg total) by mouth daily. 90 capsule 3  . losartan (COZAAR) 100 MG tablet Take 1  tablet (100 mg total) by mouth daily. 90 tablet 3  . mirtazapine (REMERON) 7.5 MG tablet Take 1/2 tablets by mouth once daily 30 tablet 5  . sucralfate (CARAFATE) 1 GM/10ML suspension Take 10 mLs (1 g total) by mouth 4 (four) times daily -  with meals and at bedtime. 420 mL 0   No facility-administered medications prior to visit.     Allergies:   Amiodarone; Amoxicillin; Demerol; Hydralazine; Lisinopril; and Tape   Social History   Social History  . Marital Status: Widowed    Spouse Name: N/A  . Number of Children: N/A  . Years of Education: N/A   Social History Main Topics  . Smoking status: Never Smoker   . Smokeless tobacco: Never Used  . Alcohol Use: No  .  Drug Use: No  . Sexual Activity: Not Asked   Other Topics Concern  . None   Social History Narrative   DIET: None      DO YOU DRINK/EAT THINGS WITH CAFFEINE: yes      MARITAL STATUS: widow      WHAT YEAR WERE YOU MARRIED: 1948      DO YOU LIVE IN A HOUSE, APARTMENT, ASSISTED LIVING, CONDO TRAILER ETC.: Independent Living      IS IT ONE OR MORE STORIES: 1 story      HOW MANY PERSONS LIVE IN YOUR HOME: 1      DO YOU HAVE PETS IN YOUR HOME: no      CURRENT OR PAST PROFESSION: Book Keeper      DO YOU EXERCISE: no      WHAT TYPE AND HOW OFTEN:     Family History:  The patient's family history includes Heart attack in her father; Hypertension in her sister; Polycystic kidney disease in her mother. There is no history of Dementia.   ROS:   Please see the history of present illness.    ROS All other systems reviewed and are negative.   PHYSICAL EXAM:   VS:  BP 121/64 mmHg  Pulse 62  Ht 5\' 2"  (1.575 m)  Wt 59.512 kg (131 lb 3.2 oz)  BMI 23.99 kg/m2   GEN: Well nourished, well developed, in no acute distress HEENT: normal Neck: no JVD, carotid bruits, or masses Cardiac: RRR; no murmurs, rubs, or gallops,no edema  Respiratory:  clear to auscultation bilaterally, normal work of breathing GI: soft,  nontender, nondistended, + BS MS: no deformity or atrophy Skin: warm and dry, no rash Neuro:  Alert and Oriented x 3, Strength and sensation are intact Psych: euthymic mood, full affect  Wt Readings from Last 3 Encounters:  06/14/16 59.512 kg (131 lb 3.2 oz)  05/13/16 58.514 kg (129 lb)  05/09/16 57.788 kg (127 lb 6.4 oz)      Studies/Labs Reviewed:   EKG:  EKG is not ordered today.    Recent Labs: 02/29/2016: B Natriuretic Peptide 607.6* 03/01/2016: TSH 4.447 05/02/2016: ALT 14; Magnesium 1.8 05/03/2016: BUN 9; Creatinine, Ser 0.92; Hemoglobin 12.9; Platelets 240; Potassium 3.7; Sodium 132*   Lipid Panel    Component Value Date/Time   CHOL 195 05/02/2016 0732   TRIG 49 05/02/2016 0732   HDL 65 05/02/2016 0732   CHOLHDL 3.0 05/02/2016 0732   VLDL 10 05/02/2016 0732   LDLCALC 120* 05/02/2016 0732     ASSESSMENT:    1. Atrial fibrillation with rapid ventricular response ([place])   2. Essential hypertension, malignant   3. SSS (sick sinus syndrome) ([place])   4. Current use of long term anticoagulation      PLAN:  In order of problems listed above:  1. AFib: At least from a symptomatic point of view, no recent atrial fibrillation. No embolic events recently and no bleeding complications since adding eliquis. Questionable remote history of TIA. Embolic risk is high, but her recent fall and slowly progressive dementia are of great concern. We are avoiding additional rate control or rhythm control medications since she would prefer not to have a pacemaker implanted. 2. HTN: Blood pressure control is good on a combination of amlodipine, hydrochlorothiazide, losartan and metoprolol. She has evidence of hypertensive cardiomyopathy with LVH. BNP has been elevated, but clinically she has not had overt congestive heart failure. 3. SSS: We are honoring her desire for least invasive approach. Her fall  was not related to syncope. However she is now agreeable to undergo pacemaker implantation  if we do not have another solution and if she has recurrent atrial fibrillation with rapid ventricular response, rather than starting a different antiarrhythmic, would probably advocate pacemaker implantation and rate control with AV blocking agents in higher doses. 4. Her recent fall is quite worrisome. At this point, almost 2 weeks following her injury and without any neurological events, imaging does not appear immediately necessary. Asked the patient and her daughter to keep an eye out for any strange behavior, sleepiness, poor coordination or other signs of possible indolent subdural hematoma.     Medication Adjustments/Labs and Tests Ordered: Current medicines are reviewed at length with the patient today.  Concerns regarding medicines are outlined above.  Medication changes, Labs and Tests ordered today are listed in the Patient Instructions below. Patient Instructions  Dr Sallyanne Kuster has recommended making the following medication changes: 1. You may take an extra Metoprolol tablet daily as needed for rapid heart rate  Dr Sallyanne Kuster recommends that you schedule a follow-up appointment in 4 months.  If you need a refill on your cardiac medications before your next appointment, please call your pharmacy.     Signed, Sanda Klein, MD  06/16/2016 3:39 PM    [facility] [address], [place], [place]  [address] Phone: [phone]; Fax: [phone]

## 2016-06-14 NOTE — Patient Instructions (Signed)
Dr Sallyanne Kuster has recommended making the following medication changes: 1. You may take an extra Metoprolol tablet daily as needed for rapid heart rate  Dr Sallyanne Kuster recommends that you schedule a follow-up appointment in 4 months.  If you need a refill on your cardiac medications before your next appointment, please call your pharmacy.

## 2016-06-16 DIAGNOSIS — Z7901 Long term (current) use of anticoagulants: Secondary | ICD-10-CM | POA: Insufficient documentation

## 2016-06-21 NOTE — Progress Notes (Signed)
Addendum to discharge summary: retrospectively, it is difficult to say what expected caused the chest pain, but it very well could have been due to uncontrolled hypertension, although patient was convinced it was the new hydralazine medication. Norvasc was added due to her medical regimen to help control BP better.  Hilbert Corrigan PA Pager: [phone]

## 2016-06-24 ENCOUNTER — Ambulatory Visit (INDEPENDENT_AMBULATORY_CARE_PROVIDER_SITE_OTHER): Payer: Medicare Other | Admitting: Internal Medicine

## 2016-06-24 ENCOUNTER — Encounter: Payer: Self-pay | Admitting: Internal Medicine

## 2016-06-24 VITALS — BP 140/60 | HR 60 | Temp 97.7°F | Ht 62.0 in | Wt 129.0 lb

## 2016-06-24 DIAGNOSIS — E2839 Other primary ovarian failure: Secondary | ICD-10-CM | POA: Diagnosis not present

## 2016-06-24 DIAGNOSIS — Z780 Asymptomatic menopausal state: Secondary | ICD-10-CM | POA: Diagnosis not present

## 2016-06-24 DIAGNOSIS — K219 Gastro-esophageal reflux disease without esophagitis: Secondary | ICD-10-CM

## 2016-06-24 DIAGNOSIS — F0391 Unspecified dementia with behavioral disturbance: Secondary | ICD-10-CM

## 2016-06-24 DIAGNOSIS — M8580 Other specified disorders of bone density and structure, unspecified site: Secondary | ICD-10-CM | POA: Diagnosis not present

## 2016-06-24 DIAGNOSIS — I248 Other forms of acute ischemic heart disease: Secondary | ICD-10-CM | POA: Diagnosis not present

## 2016-06-24 DIAGNOSIS — H6121 Impacted cerumen, right ear: Secondary | ICD-10-CM

## 2016-06-24 DIAGNOSIS — G4700 Insomnia, unspecified: Secondary | ICD-10-CM

## 2016-06-24 DIAGNOSIS — F4323 Adjustment disorder with mixed anxiety and depressed mood: Secondary | ICD-10-CM | POA: Diagnosis not present

## 2016-06-24 DIAGNOSIS — F03918 Unspecified dementia, unspecified severity, with other behavioral disturbance: Secondary | ICD-10-CM

## 2016-06-24 MED ORDER — ALPRAZOLAM 0.5 MG PO TABS
0.5000 mg | ORAL_TABLET | Freq: Every evening | ORAL | Status: DC | PRN
Start: 2016-06-24 — End: 2016-06-24

## 2016-06-24 MED ORDER — ALPRAZOLAM 0.5 MG PO TABS: 0.5000 mg | ORAL_TABLET | Freq: Every evening | ORAL | Status: DC | PRN

## 2016-06-24 NOTE — Progress Notes (Signed)
Location:  [facility] clinic Provider:  Blake Vetrano L. Mariea Clonts, D.O., C.M.D.  Code Status: DNR Goals of Care:  Advanced Directives 06/24/2016  Does patient have an advance directive? Yes  Type of Advance Directive [place]  Does patient want to make changes to advanced directive? -  Copy of advanced directive(s) in chart? Yes     Chief Complaint  Patient presents with  . Medical Management of Chronic Issues    6 week follow-up, headaches,check mouth after fall, hearing    HPI: Patient is a 82 y.o. female seen today for medical management of chronic diseases.    BP and HR have now been good in the 60s.    She can hear her heartbeat at night.  Per her daughter, Dr. Loletha Grayer advised for her to take an extra toprol if she feels her pulse go up.  She has not needed to take it thus far.  She notices her symptoms each morning.  Sometimes will resolve if she gets up.  Other times has to sit in her chair for a bit before it will go away.  Has always gotten up early anyway around 5am.  She was having disturbing dreams.  1/2 tab of mirtazapine and that didn't help.  Still on aricept.    Went to see the clinical pharmacologist with Dr. Loletha Grayer and they didn't think the meds were causing the GERD.  She's back on xanax 0.25mg .  Still taking morning prilosec regularly which of course would affect that.  Forgets the evening prilosec frequently.  No longer using carafate either.  No chest pains or bad spells.    Still not sleeping well.  Is on the melatonin.  Will feel sleepy, go to bed, then can't sleep so gets back up in chair.  No clear difference since she's back on the xanax 0.25mg  at hs.    Past Medical History  Diagnosis Date  . Paroxysmal a-fib ([place])   . Hypertension   . Formed visual hallucinations     [name]?  Failed Keppra and Depakote  . Macular degeneration   . Diverticulitis 09/28/2007  . TIA (transient ischemic attack) 2000s?  [name] Neurocysticercosis     Craniotomy at [facility]  in early 2000s  . Depression 09/06/2002  . UTI (urinary tract infection)   . Paranoia ([place])   . Bradycardia, drug induced   . Leg pain, left   . Pure hypercholesterolemia 12/20/1999  . Anxiety 01/17/2003  . Cerebral atherosclerosis 09/24/1999  . Back pain 08/19/2005    with radiculopathy  . Chest pain, atypical     cath 05/03/2016 for recurrent CP, mild nonobstructive dx. Mild to moderate R renal artery stenosis, patent L renal artery  . Diverticulosis of colon 06/07/2003  . External hemorrhoids 04/12/2009  . Malaise and fatigue   . Osteoporosis 05/28/2004  . PSVT (paroxysmal supraventricular tachycardia) ([place]) 02/22/2002  . Palpitations 11/16/2001  . Female climacteric state 03/04/2000  . Abnormal weight loss 05/28/2004  . Urinary incontinence 08/18/2007  . Vaginitis, atrophic   . Vertigo, peripheral 10/07/2000  . Hallucination, visual   . GERD (gastroesophageal reflux disease)   . Osteoarthrosis, localized   . Arthritis     "a little; qwhere" (5/25/20170  . Dementia     "don't know kind or stage" (05/02/2016)  . Hypertension     cath 05/03/2016 Mild to moderate R renal artery stenosis, patent L renal artery    Past Surgical History  Procedure Laterality Date  . Shoulder arthroscopy w/  rotator cuff repair Right X 3  . Total knee arthroplasty Left 1990  . Cataract extraction  2013  . Vaginal hysterectomy  1960  . Joint replacement    . Brain surgery  2000    Neurocysticercosis ("parasite")  . Cataract extraction, bilateral      "not sure if both eyes; feel like it probably was"  . Cardiac catheterization N/A 05/03/2016    Procedure: Left Heart Cath and Coronary Angiography;  Surgeon: Sherren Mocha, MD;  Location: [place] [facility];  Service: Cardiovascular;  Laterality: N/A;  . Peripheral vascular catheterization N/A 05/03/2016    Procedure: Abdominal Aortogram;  Surgeon: Sherren Mocha, MD;  Location: [place] [facility];  Service: Cardiovascular;  Laterality: N/A;    Allergies    Allergen Reactions  . Amiodarone Itching  . Amoxicillin Other (See Comments)    Unknown- ? Upset stomach  . Demerol [Meperidine] Other (See Comments)    Hallucinations  . Hydralazine Other (See Comments)    Tingling and chest pain   . Lisinopril Cough  . Tape Rash    Can tear the skin, also      Medication List       This list is accurate as of: 06/24/16  3:09 PM.  Always use your most recent med list.               acetaminophen 325 MG tablet  Commonly known as:  TYLENOL  Take 650 mg by mouth every 6 (six) hours as needed for mild pain.     amLODipine 5 MG tablet  Commonly known as:  NORVASC  Take 1 tablet (5 mg total) by mouth daily.     apixaban 2.5 MG Tabs tablet  Commonly known as:  ELIQUIS  Take 1 tablet (2.5 mg total) by mouth 2 (two) times daily.     atorvastatin 40 MG tablet  Commonly known as:  LIPITOR  Take 1 tablet (40 mg total) by mouth daily at 6 PM.     docusate sodium 100 MG capsule  Commonly known as:  COLACE  Take 100 mg by mouth 2 (two) times daily.     donepezil 10 MG tablet  Commonly known as:  ARICEPT  Take 1 tablet (10 mg total) by mouth daily.     hydrochlorothiazide 12.5 MG capsule  Commonly known as:  MICROZIDE  Take 1 capsule (12.5 mg total) by mouth daily.     losartan 100 MG tablet  Commonly known as:  COZAAR  Take 1 tablet (100 mg total) by mouth daily.     Melatonin 3 MG Caps  Take by mouth at bedtime.     metoprolol tartrate 25 MG tablet  Commonly known as:  LOPRESSOR  Take 25 mg by mouth 2 (two) times daily. Hold if pulse goes below 45     omeprazole 20 MG capsule  Commonly known as:  PRILOSEC  Take 1 capsule (20 mg total) by mouth 2 (two) times daily before a meal.     polyethylene glycol packet  Commonly known as:  MIRALAX / GLYCOLAX  Take 17 g by mouth every other day.     tobramycin 0.3 % ophthalmic solution  Commonly known as:  TOBREX  Place 1 drop into the left eye See admin instructions. Use drops when  receiving macular degeneration shot into left eye four times a day for several days     venlafaxine 37.5 MG tablet  Commonly known as:  EFFEXOR  Take 1 tablet (37.5  mg total) by mouth 2 (two) times daily.        Review of Systems:  Review of Systems  Constitutional: Negative for fever, chills and malaise/fatigue.  Eyes: Positive for blurred vision.       Glasses  Respiratory: Negative for cough and shortness of breath.   Cardiovascular: Negative for chest pain and leg swelling.  Gastrointestinal: Negative for heartburn, abdominal pain and constipation.       GERD improved lately with regular prilosec and back on xanax that family restarted on own (unclear which made it better now)  Genitourinary: Negative for dysuria.  Musculoskeletal: Positive for falls. Myalgias: still has bruising left face, beneath eye, sore in lower lip.  Skin: Negative for rash.  Neurological: Negative for dizziness, weakness and headaches.  Psychiatric/Behavioral: Positive for depression and memory loss. The patient is nervous/anxious and has insomnia.        Depression improved, still anxious at hs; no dreams off mirtazapine    Health Maintenance  Topic Date Due  . ZOSTAVAX  11/10/1989  . DEXA SCAN  11/10/1994  . INFLUENZA VACCINE  07/09/2016  . TETANUS/TDAP  05/14/2022  . PNA vac Low Risk Adult  Completed    Physical Exam: Filed Vitals:   06/24/16 1449  BP: 140/60  Pulse: 60  Temp: 97.7 F (36.5 C)  TempSrc: Oral  Height: 5\' 2"  (1.575 m)  Weight: 129 lb (58.514 kg)  SpO2: 98%   Body mass index is 23.59 kg/(m^2). Physical Exam  Constitutional: She is oriented to person, place, and time. She appears well-developed and well-nourished. No distress.  HENT:  Right ear with large piece of cerumen impacting canal--was removed with plastic loop and metal tweezers instruments in ear by me  Cardiovascular: Intact distal pulses.   irreg irreg (but rate controlled at present)  Pulmonary/Chest: Effort  normal and breath sounds normal.  Abdominal: Bowel sounds are normal.  Musculoskeletal: Normal range of motion.  Neurological: She is alert and oriented to person, place, and time.  Some short term memory loss (looks to daughter for more input during visit)  Skin:  Ecchymoses of left cheek, beneath eye; ulcerated area with small papule inner lip (daughter thinks she bit her lip during fall)  Psychiatric: She has a normal mood and affect.    Labs reviewed: Basic Metabolic Panel:  Recent Labs  12/26/15 1536  01/16/16 1058  03/01/16 0330  04/29/16 1528 05/02/16 0025 05/02/16 0732 05/03/16 0633  NA 139  < > 139  < > 135  < > 133* 129*  --  132*  K 3.0*  < > 4.3  < > 4.1  < > 3.9 3.1*  --  3.7  CL 98*  < > 98  < > 102  < > 94* 91*  --  96*  CO2 28  < > 22  < > 23  --  28 26  --  27  GLUCOSE 100*  < > 80  < > 124*  < > 78 91  --  98  BUN 12  < > 18  < > 22*  < > 14 12  --  9  CREATININE 0.89  < > 0.73  < > 0.92  < > 0.84 0.86  --  0.92  CALCIUM 9.5  < > 9.3  < > 9.0  --  9.6 9.8  --  9.2  MG 1.8  --   --   --  1.8  --   --   --  1.8  --   TSH  --   --  6.250*  --  4.447  --   --   --   --   --   < > = values in this interval not displayed. Liver Function Tests:  Recent Labs  01/16/16 1058 03/01/16 0330 05/02/16 0025  AST 14 25 22   ALT 15 27 14   ALKPHOS 114 91 75  BILITOT <0.2 0.7 0.6  PROT 7.1 6.5 6.8  ALBUMIN 3.9 3.0* 3.4*   No results for input(s): LIPASE, AMYLASE in the last 8760 hours. No results for input(s): AMMONIA in the last 8760 hours. CBC:  Recent Labs  01/16/16 1058  05/02/16 0025 05/02/16 0732 05/03/16 0130  WBC 4.9  < > 5.8 5.7 5.9  NEUTROABS 3.1  --  3.4  --   --   HGB  --   < > 13.1 13.4 12.9  HCT 35.9  < > 39.2 39.6 37.9  MCV 87  < > 85.0 84.3 85.2  PLT 266  < > 219 223 240  < > = values in this interval not displayed. Lipid Panel:  Recent Labs  05/02/16 0732  CHOL 195  HDL 65  LDLCALC 120*  TRIG 49  CHOLHDL 3.0   Lab Results    Component Value Date   HGBA1C 6.2* 05/02/2016   Assessment/Plan 1. Adjustment disorder with mixed anxiety and depressed mood - more difficulty since moving away from her home to facility here - will prescribe xanax 0.5mg  at hs as 0.25mg  family restarted on their own was ineffective - ALPRAZolam (XANAX) 0.5 MG tablet; Take 1 tablet (0.5 mg total) by mouth at bedtime as needed for anxiety or sleep.  Dispense: 90 tablet; Refill: 3  2. Insomnia - increase xanax due to anxiety at bedtime--fax to [facility] - ALPRAZolam (XANAX) 0.5 MG tablet; Take 1 tablet (0.5 mg total) by mouth at bedtime as needed for anxiety or sleep.  Dispense: 90 tablet; Refill: 3  3. Osteopenia -per daughter in the past -DG Bone Density; Future  4. Cerumen impaction, right -removed thru instrumentation by me  5. Dementia with behavioral disturbance -stable on aricept it seems at this point--does take in am so should not be affecting her sleep   6. Gastroesophageal reflux disease without esophagitis -cont prilosec in am which is helping-forgets evening dose often so may d/c that dose when reorder  7. Postmenopausal - DG Bone Density; Future  8. Estrogen deficiency - DG Bone Density; Future   Labs/tests ordered:   Orders Placed This Encounter  Procedures  . DG Bone Density    Standing Status: Future     Number of Occurrences:      Standing Expiration Date: 08/24/2017    Order Specific Question:  Reason for Exam (SYMPTOM  OR DIAGNOSIS REQUIRED)    Answer:  postmenopausal; estrogen deficiency    Order Specific Question:  Preferred imaging location?    Answer:  [facility]    Next appt:  3 mos med mgt   Sarabella Caprio L. Artavious Trebilcock, D.O. [facility] [address], [place] [address] Cell Phone (Mon-Fri 8am-5pm):  [phone] On Call:  [phone] & follow prompts after 5pm & weekends Office Phone:  [phone] Office Fax:  [phone]

## 2016-07-04 ENCOUNTER — Ambulatory Visit: Payer: Medicare Other

## 2016-07-05 ENCOUNTER — Ambulatory Visit (INDEPENDENT_AMBULATORY_CARE_PROVIDER_SITE_OTHER): Payer: Medicare Other | Admitting: Pharmacist

## 2016-07-05 VITALS — BP 138/64 | HR 54 | Wt 130.6 lb

## 2016-07-05 DIAGNOSIS — I10 Essential (primary) hypertension: Secondary | ICD-10-CM | POA: Diagnosis not present

## 2016-07-05 DIAGNOSIS — I248 Other forms of acute ischemic heart disease: Secondary | ICD-10-CM

## 2016-07-05 NOTE — Patient Instructions (Addendum)
Return for a a follow up appointment with Dr. Loletha Grayer as scheduled  Your blood pressure today is  138/64  Check your blood pressure at home daily (if able) and keep record of the readings.  Take your BP meds as follows: Continue current medications  Bring all of your meds, your BP cuff and your record of home blood pressures to your next appointment.  Exercise as you're able, try to walk approximately 30 minutes per day.  Keep salt intake to a minimum, especially watch canned and prepared boxed foods.  Eat more fresh fruits and vegetables and fewer canned items.  Avoid eating in fast food restaurants.    HOW TO TAKE YOUR BLOOD PRESSURE: . Rest 5 minutes before taking your blood pressure. .  Don't smoke or drink caffeinated beverages for at least 30 minutes before. . Take your blood pressure before (not after) you eat. . Sit comfortably with your back supported and both feet on the floor (don't cross your legs). . Elevate your arm to heart level on a table or a desk. . Use the proper sized cuff. It should fit smoothly and snugly around your bare upper arm. There should be enough room to slip a fingertip under the cuff. The bottom edge of the cuff should be 1 inch above the crease of the elbow. . Ideally, take 3 measurements at one sitting and record the average.

## 2016-07-07 ENCOUNTER — Encounter: Payer: Self-pay | Admitting: Pharmacist

## 2016-07-07 NOTE — Progress Notes (Signed)
Patient ID: Brittney Gutierrez                 DOB: [birth year]                      MRN: [id number]     HPI: Brittney Gutierrez is a 82 y.o. female patient of Dr. Sallyanne Kuster who presents for hypertension follow up with her daughter. She reports feeling much better since our last encounter. Her acid reflux has improved with the addition of prilosec and her mental state has also improved after stopping mirtazapine and restarting Xanax (which she had been on at low doses for many years).   Today she reports occasional dizzy spells solely when she is bending to wash her feet in the shower. She denies these episodes when bending to put shoes on outside of the shower. She also states that she never gets dizzy when going from sitting to standing. She is to have this evaluated by Primary care next week.   At her visit with Dr. Loletha Grayer she was told she can use extra metoprolol if she feels her heart racing. She states she had been having episodes prior to the visit with him but has not had any episodes since and has not taken any extra metoprolol.   Current HTN meds:  Amlodipine 5mg  each evening Hydrochlorothiazide 12.5mg  in the morning Losartan 100mg  in the morning Metoprolol 50mg  BID  Previously tried:  Hydralazine - stopped due to tingling Lisinopril - stopped recently due to dry cough  BP goal: <150/90  Family History: no significant cardiac history  Social History: denies tobacco and alcohol   Diet: eats all of her meals from [employer]. Does not add salt to foods. She has one cup of coffee a day. She does not drink any other caffeine during the day.   Exercise: no exercise right now  Home BP readings: She does not check at home but has an aid that can check if requested.    Wt Readings from Last 3 Encounters:  07/05/16 130 lb 9.6 oz (59.2 kg)  06/24/16 129 lb (58.5 kg)  06/14/16 131 lb 3.2 oz (59.5 kg)   BP Readings from Last 3 Encounters:  07/05/16 138/64  06/24/16 140/60  06/14/16  121/64   Pulse Readings from Last 3 Encounters:  07/05/16 (!) 54  06/24/16 60  06/14/16 62    Renal function: CrCl cannot be calculated (Patient's most recent lab result is older than the maximum 21 days allowed.).  Past Medical History:  Diagnosis Date  . Abnormal weight loss 05/28/2004  . Anxiety 01/17/2003  . Arthritis    "a little; qwhere" (5/25/20170  . Back pain 08/19/2005   with radiculopathy  . Bradycardia, drug induced   . Cerebral atherosclerosis 09/24/1999  . Chest pain, atypical    cath 05/03/2016 for recurrent CP, mild nonobstructive dx. Mild to moderate R renal artery stenosis, patent L renal artery  . Dementia    "don't know kind or stage" (05/02/2016)  . Depression 09/06/2002  . Diverticulitis 09/28/2007  . Diverticulosis of colon 06/07/2003  . External hemorrhoids 04/12/2009  . Female climacteric state 03/04/2000  . Formed visual hallucinations    [name]?  Failed Keppra and Depakote  . GERD (gastroesophageal reflux disease)   . Hallucination, visual   . Hypertension   . Hypertension    cath 05/03/2016 Mild to moderate R renal artery stenosis, patent L renal artery  . Leg pain, left   . Macular  degeneration   . Malaise and fatigue   . 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 Osteoarthrosis, localized   . Osteoporosis 05/28/2004  . Palpitations 11/16/2001  . Paranoia ([place])   . Paroxysmal a-fib ([place])   . PSVT (paroxysmal supraventricular tachycardia) ([place]) 02/22/2002  . Pure hypercholesterolemia 12/20/1999  . TIA (transient ischemic attack) 2000s?  [name] Urinary incontinence 08/18/2007  . UTI (urinary tract infection)   . Vaginitis, atrophic   . Vertigo, peripheral 10/07/2000    Current Outpatient Prescriptions on File Prior to Visit  Medication Sig Dispense Refill  . acetaminophen (TYLENOL) 325 MG tablet Take 650 mg by mouth every 6 (six) hours as needed for mild pain.    [name] ALPRAZolam (XANAX) 0.5 MG tablet Take 1 tablet (0.5 mg total)  by mouth at bedtime as needed for anxiety or sleep. 90 tablet 3  . amLODipine (NORVASC) 5 MG tablet Take 1 tablet (5 mg total) by mouth daily. 90 tablet 3  . apixaban (ELIQUIS) 2.5 MG TABS tablet Take 1 tablet (2.5 mg total) by mouth 2 (two) times daily. 180 tablet 3  . atorvastatin (LIPITOR) 40 MG tablet Take 1 tablet (40 mg total) by mouth daily at 6 PM. 90 tablet 3  . docusate sodium (COLACE) 100 MG capsule Take 100 mg by mouth 2 (two) times daily.    [name] donepezil (ARICEPT) 10 MG tablet Take 1 tablet (10 mg total) by mouth daily. 90 tablet 3  . hydrochlorothiazide (MICROZIDE) 12.5 MG capsule Take 1 capsule (12.5 mg total) by mouth daily. 90 capsule 3  . losartan (COZAAR) 100 MG tablet Take 1 tablet (100 mg total) by mouth daily. 90 tablet 3  . metoprolol tartrate (LOPRESSOR) 25 MG tablet Take 25 mg by mouth 2 (two) times daily. Hold if pulse goes below 45    . omeprazole (PRILOSEC) 20 MG capsule Take 1 capsule (20 mg total) by mouth 2 (two) times daily before a meal. (Patient taking differently: Take 20 mg by mouth daily. ) 60 capsule 5  . polyethylene glycol (MIRALAX / GLYCOLAX) packet Take 17 g by mouth daily as needed.     . tobramycin (TOBREX) 0.3 % ophthalmic solution Place 1 drop into the left eye See admin instructions. Use drops when receiving macular degeneration shot into left eye four times a day for several days    . venlafaxine (EFFEXOR) 37.5 MG tablet Take 1 tablet (37.5 mg total) by mouth 2 (two) times daily. 180 tablet 3   No current facility-administered medications on file prior to visit.     Allergies  Allergen Reactions  . Amiodarone Itching  . Amoxicillin Other (See Comments)    Unknown- ? Upset stomach  . Demerol [Meperidine] Other (See Comments)    Hallucinations  . Hydralazine Other (See Comments)    Tingling and chest pain   . Lisinopril Cough  . Tape Rash    Can tear the skin, also     Assessment/Plan: Hypertension:  BP is at goal. The dizziness associated  with her shower is likely associated with high shower temperature since this does not occur at any other time while bending over. Advised to use a shower chair (which she already has). She is to be evaluated by primary care for additional recommendations. No medication changes today. Advised ok for her to take extra metoprolol and then her regular dose at her scheduled time. She is to call if she has any issues; otherwise she will follow up  with Dr. Loletha Grayer as planned and hypertension clinic as needed.   Thank you, Lelan Pons. Patterson Hammersmith, [place]

## 2016-07-08 ENCOUNTER — Ambulatory Visit: Payer: Medicare Other | Admitting: Nurse Practitioner

## 2016-07-08 ENCOUNTER — Encounter: Payer: Self-pay | Admitting: Nurse Practitioner

## 2016-07-08 ENCOUNTER — Ambulatory Visit (INDEPENDENT_AMBULATORY_CARE_PROVIDER_SITE_OTHER): Payer: Medicare Other | Admitting: Nurse Practitioner

## 2016-07-08 ENCOUNTER — Ambulatory Visit
Admission: RE | Admit: 2016-07-08 | Discharge: 2016-07-08 | Disposition: A | Discharge status: Home / Self Care | Payer: Medicare Other | Source: Ambulatory Visit | Attending: Internal Medicine | Admitting: Internal Medicine

## 2016-07-08 VITALS — BP 114/68 | HR 61 | Temp 98.0°F | Resp 19 | Ht 62.0 in | Wt 132.6 lb

## 2016-07-08 DIAGNOSIS — F0391 Unspecified dementia with behavioral disturbance: Secondary | ICD-10-CM | POA: Diagnosis not present

## 2016-07-08 DIAGNOSIS — Z780 Asymptomatic menopausal state: Secondary | ICD-10-CM

## 2016-07-08 DIAGNOSIS — G4700 Insomnia, unspecified: Secondary | ICD-10-CM

## 2016-07-08 DIAGNOSIS — M810 Age-related osteoporosis without current pathological fracture: Secondary | ICD-10-CM | POA: Diagnosis not present

## 2016-07-08 DIAGNOSIS — E2839 Other primary ovarian failure: Secondary | ICD-10-CM

## 2016-07-08 DIAGNOSIS — R42 Dizziness and giddiness: Secondary | ICD-10-CM

## 2016-07-08 DIAGNOSIS — I248 Other forms of acute ischemic heart disease: Secondary | ICD-10-CM | POA: Diagnosis not present

## 2016-07-08 DIAGNOSIS — M8580 Other specified disorders of bone density and structure, unspecified site: Secondary | ICD-10-CM

## 2016-07-08 DIAGNOSIS — K219 Gastro-esophageal reflux disease without esophagitis: Secondary | ICD-10-CM | POA: Diagnosis not present

## 2016-07-08 DIAGNOSIS — F03918 Unspecified dementia, unspecified severity, with other behavioral disturbance: Secondary | ICD-10-CM

## 2016-07-08 NOTE — Patient Instructions (Signed)
Make sure to stay hydrated Sit down in shower chair as a part of your shower routine.

## 2016-07-08 NOTE — Progress Notes (Signed)
PCP: Lauree Chandler, NP  Advanced Directive information Type of Advance Directive: Healthcare Power of Attorney  Allergies  Allergen Reactions  . Amiodarone Itching  . Amoxicillin Other (See Comments)    Unknown- ? Upset stomach  . Demerol [Meperidine] Other (See Comments)    Hallucinations  . Hydralazine Other (See Comments)    Tingling and chest pain   . Lisinopril Cough  . Tape Rash    Can tear the skin, also    Chief Complaint  Patient presents with  . Medical Management of Chronic Issues    Dizziness x 1-2 months while bending down in shower. Pt lays down and dizziness goes away.   . Other    daughter, [name], in room with patient.      HPI: Patient is a 82 y.o. female seen in the office today due to dizziness. Pt with hx of a fib, htn, hallucinations, depression, insomnia, OP, dementia with behaviors. Pt reports dizziness that is occurring when she is in the shower. Gets dizzy when she bends down to wash her legs. Gets out of the shower and lays down and then it goes away.  Really stuffy in the bathroom during her shower, has left the door open and it has helped some.  Has a shower chair but doesn't use it.  Took shower this morning and did not have dizziness   At last visit, xanax was increase due to anxiety and insomnia. Reports she is sleeping well now.  No increase GERD, taking Prilosec in morning only and doing well.  Review of Systems:  Review of Systems  Constitutional: Negative for chills and fever.  HENT: Negative for tinnitus.   Respiratory: Negative for cough and shortness of breath.   Cardiovascular: Negative for chest pain, palpitations and leg swelling.  Gastrointestinal: Negative for abdominal pain.  Genitourinary: Negative for flank pain and hematuria.  Neurological: Positive for dizziness. Negative for tremors, seizures, syncope, facial asymmetry, speech difficulty, weakness, light-headedness, numbness and headaches.    Psychiatric/Behavioral: Negative for hallucinations.       Chronically sees bluish figures with macular    Past Medical History:  Diagnosis Date  . Abnormal weight loss 05/28/2004  . Anxiety 01/17/2003  . Arthritis    "a little; qwhere" (5/25/20170  . Back pain 08/19/2005   with radiculopathy  . Bradycardia, drug induced   . Cerebral atherosclerosis 09/24/1999  . Chest pain, atypical    cath 05/03/2016 for recurrent CP, mild nonobstructive dx. Mild to moderate R renal artery stenosis, patent L renal artery  . Dementia    "don't know kind or stage" (05/02/2016)  . Depression 09/06/2002  . Diverticulitis 09/28/2007  . Diverticulosis of colon 06/07/2003  . External hemorrhoids 04/12/2009  . Female climacteric state 03/04/2000  . Formed visual hallucinations    [name]?  Failed Keppra and Depakote  . GERD (gastroesophageal reflux disease)   . Hallucination, visual   . Hypertension   . Hypertension    cath 05/03/2016 Mild to moderate R renal artery stenosis, patent L renal artery  . Leg pain, left   . Macular degeneration   . Malaise and fatigue   . 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 Osteoarthrosis, localized   . Osteoporosis 05/28/2004  . Palpitations 11/16/2001  . Paranoia ([place])   . Paroxysmal a-fib ([place])   . PSVT (paroxysmal supraventricular tachycardia) ([place]) 02/22/2002  . Pure hypercholesterolemia 12/20/1999  . TIA (transient ischemic attack) 2000s?  [name] Urinary  incontinence 08/18/2007  . UTI (urinary tract infection)   . Vaginitis, atrophic   . Vertigo, peripheral 10/07/2000   Past Surgical History:  Procedure Laterality Date  . BRAIN SURGERY  2000   Neurocysticercosis ("parasite")  . CARDIAC CATHETERIZATION N/A 05/03/2016   Procedure: Left Heart Cath and Coronary Angiography;  Surgeon: Sherren Mocha, MD;  Location: [facility];  Service: Cardiovascular;  Laterality: N/A;  . CATARACT EXTRACTION  2013  . CATARACT EXTRACTION, BILATERAL      "not sure if both eyes; feel like it probably was"  . JOINT REPLACEMENT    . PERIPHERAL VASCULAR CATHETERIZATION N/A 05/03/2016   Procedure: Abdominal Aortogram;  Surgeon: Sherren Mocha, MD;  Location: [facility];  Service: Cardiovascular;  Laterality: N/A;  . SHOULDER ARTHROSCOPY W/ ROTATOR CUFF REPAIR Right X 3  . TOTAL KNEE ARTHROPLASTY Left 1990  . VAGINAL HYSTERECTOMY  1960   Social History:   reports that she has never smoked. She has never used smokeless tobacco. She reports that she does not drink alcohol or use drugs.  Family History  Problem Relation Age of Onset  . Polycystic kidney disease Mother   . Heart attack Father   . Hypertension Sister   . Dementia Neg Hx     Medications: Patient's Medications  New Prescriptions   No medications on file  Previous Medications   ACETAMINOPHEN (TYLENOL) 325 MG TABLET    Take 650 mg by mouth every 6 (six) hours as needed for mild pain.   ALPRAZOLAM (XANAX) 0.5 MG TABLET    Take 1 tablet (0.5 mg total) by mouth at bedtime as needed for anxiety or sleep.   AMLODIPINE (NORVASC) 5 MG TABLET    Take 1 tablet (5 mg total) by mouth daily.   APIXABAN (ELIQUIS) 2.5 MG TABS TABLET    Take 1 tablet (2.5 mg total) by mouth 2 (two) times daily.   ATORVASTATIN (LIPITOR) 40 MG TABLET    Take 1 tablet (40 mg total) by mouth daily at 6 PM.   DOCUSATE SODIUM (COLACE) 100 MG CAPSULE    Take 100 mg by mouth 2 (two) times daily.   DONEPEZIL (ARICEPT) 10 MG TABLET    Take 1 tablet (10 mg total) by mouth daily.   HYDROCHLOROTHIAZIDE (MICROZIDE) 12.5 MG CAPSULE    Take 1 capsule (12.5 mg total) by mouth daily.   LOSARTAN (COZAAR) 100 MG TABLET    Take 1 tablet (100 mg total) by mouth daily.   METOPROLOL TARTRATE (LOPRESSOR) 25 MG TABLET    Take 25 mg by mouth 2 (two) times daily. Hold if pulse goes below 45   OMEPRAZOLE (PRILOSEC) 20 MG CAPSULE    Take 20 mg by mouth daily before breakfast.   POLYETHYLENE GLYCOL (MIRALAX / GLYCOLAX) PACKET    Take  17 g by mouth daily as needed.    TOBRAMYCIN (TOBREX) 0.3 % OPHTHALMIC SOLUTION    Place 1 drop into the left eye See admin instructions. Use drops when receiving macular degeneration shot into left eye four times a day for several days   VENLAFAXINE (EFFEXOR) 37.5 MG TABLET    Take 1 tablet (37.5 mg total) by mouth 2 (two) times daily.  Modified Medications   No medications on file  Discontinued Medications   OMEPRAZOLE (PRILOSEC) 20 MG CAPSULE    Take 1 capsule (20 mg total) by mouth 2 (two) times daily before a meal.     Physical Exam:  Vitals:   07/08/16 1317  BP: 114/68  Pulse: 61  Resp: 19  Temp: 98 F (36.7 C)  TempSrc: Oral  SpO2: 99%  Weight: 132 lb 9.6 oz (60.1 kg)  Height: 5\' 2"  (1.575 m)   Body mass index is 24.25 kg/m.  Physical Exam  Constitutional: She is oriented to person, place, and time. She appears well-developed and well-nourished. No distress.  Cardiovascular: Normal rate, regular rhythm and normal heart sounds.   Pulmonary/Chest: Effort normal and breath sounds normal. No respiratory distress.  Abdominal: Soft. Bowel sounds are normal. She exhibits no distension. There is no tenderness.  Musculoskeletal: Normal range of motion.  Neurological: She is alert and oriented to person, place, and time.  Skin: Skin is warm and dry.  Psychiatric: She has a normal mood and affect.    Labs reviewed: Basic Metabolic Panel:  Recent Labs  12/26/15 1536  01/16/16 1058  03/01/16 0330  04/29/16 1528 05/02/16 0025 05/02/16 0732 05/03/16 0633  NA 139  < > 139  < > 135  < > 133* 129*  --  132*  K 3.0*  < > 4.3  < > 4.1  < > 3.9 3.1*  --  3.7  CL 98*  < > 98  < > 102  < > 94* 91*  --  96*  CO2 28  < > 22  < > 23  --  28 26  --  27  GLUCOSE 100*  < > 80  < > 124*  < > 78 91  --  98  BUN 12  < > 18  < > 22*  < > 14 12  --  9  CREATININE 0.89  < > 0.73  < > 0.92  < > 0.84 0.86  --  0.92  CALCIUM 9.5  < > 9.3  < > 9.0  --  9.6 9.8  --  9.2  MG 1.8  --   --   --   1.8  --   --   --  1.8  --   TSH  --   --  6.250*  --  4.447  --   --   --   --   --   < > = values in this interval not displayed. Liver Function Tests:  Recent Labs  01/16/16 1058 03/01/16 0330 05/02/16 0025  AST 14 25 22   ALT 15 27 14   ALKPHOS 114 91 75  BILITOT <0.2 0.7 0.6  PROT 7.1 6.5 6.8  ALBUMIN 3.9 3.0* 3.4*   No results for input(s): LIPASE, AMYLASE in the last 8760 hours. No results for input(s): AMMONIA in the last 8760 hours. CBC:  Recent Labs  01/16/16 1058  05/02/16 0025 05/02/16 0732 05/03/16 0130  WBC 4.9  < > 5.8 5.7 5.9  NEUTROABS 3.1  --  3.4  --   --   HGB  --   < > 13.1 13.4 12.9  HCT 35.9  < > 39.2 39.6 37.9  MCV 87  < > 85.0 84.3 85.2  PLT 266  < > 219 223 240  < > = values in this interval not displayed. Lipid Panel:  Recent Labs  05/02/16 0732  CHOL 195  HDL 65  LDLCALC 120*  TRIG 49  CHOLHDL 3.0   TSH:  Recent Labs  01/16/16 1058 03/01/16 0330  TSH 6.250* 4.447   A1C: Lab Results  Component Value Date   HGBA1C 6.2 (H) 05/02/2016     Assessment/Plan 1. Dizziness -appears to be  positional and related to heat. To make sure water is not too hot, leave door open and to use chair so she does not have to bend over  -to notify if symptoms worsens  2. Insomnia Improved with xanax 0.5 mg qhs  3. Dementia with behaviors -stable at this time. conts on Aricept  4. GERD -to cont lifestyle modifications, cont on prilosec daily   Jessica K. Harle Battiest  [facility] [phone] 8 am - 5 pm) [phone] (after hours)

## 2016-07-11 DIAGNOSIS — B079 Viral wart, unspecified: Secondary | ICD-10-CM | POA: Diagnosis not present

## 2016-07-11 DIAGNOSIS — M79672 Pain in left foot: Secondary | ICD-10-CM | POA: Diagnosis not present

## 2016-08-21 DIAGNOSIS — H353213 Exudative age-related macular degeneration, right eye, with inactive scar: Secondary | ICD-10-CM | POA: Diagnosis not present

## 2016-08-21 DIAGNOSIS — H353221 Exudative age-related macular degeneration, left eye, with active choroidal neovascularization: Secondary | ICD-10-CM | POA: Diagnosis not present

## 2016-08-21 DIAGNOSIS — H43813 Vitreous degeneration, bilateral: Secondary | ICD-10-CM | POA: Diagnosis not present

## 2016-09-19 ENCOUNTER — Encounter: Payer: Self-pay | Admitting: Internal Medicine

## 2016-09-19 ENCOUNTER — Ambulatory Visit (INDEPENDENT_AMBULATORY_CARE_PROVIDER_SITE_OTHER): Payer: Medicare Other | Admitting: Internal Medicine

## 2016-09-19 VITALS — BP 110/68 | HR 48 | Temp 97.9°F | Wt 124.0 lb

## 2016-09-19 DIAGNOSIS — R531 Weakness: Secondary | ICD-10-CM | POA: Diagnosis not present

## 2016-09-19 DIAGNOSIS — I10 Essential (primary) hypertension: Secondary | ICD-10-CM

## 2016-09-19 DIAGNOSIS — M25552 Pain in left hip: Secondary | ICD-10-CM | POA: Diagnosis not present

## 2016-09-19 DIAGNOSIS — I248 Other forms of acute ischemic heart disease: Secondary | ICD-10-CM | POA: Diagnosis not present

## 2016-09-19 DIAGNOSIS — Z23 Encounter for immunization: Secondary | ICD-10-CM

## 2016-09-19 DIAGNOSIS — F0281 Dementia in other diseases classified elsewhere with behavioral disturbance: Secondary | ICD-10-CM

## 2016-09-19 DIAGNOSIS — F02818 Alzheimer's disease with late onset: Secondary | ICD-10-CM

## 2016-09-19 DIAGNOSIS — I959 Hypotension, unspecified: Secondary | ICD-10-CM

## 2016-09-19 DIAGNOSIS — G301 Alzheimer's disease with late onset: Secondary | ICD-10-CM

## 2016-09-19 LAB — CBC WITH DIFFERENTIAL/PLATELET
Basophils Absolute: 0 cells/uL (ref 0–200)
Basophils Relative: 0 %
Eosinophils Absolute: 165 cells/uL (ref 15–500)
Eosinophils Relative: 3 %
HCT: 35.2 % (ref 35.0–45.0)
Hemoglobin: 11.7 g/dL (ref 11.7–15.5)
Lymphocytes Relative: 26 %
Lymphs Abs: 1430 cells/uL (ref 850–3900)
MCH: 30 pg (ref 27.0–33.0)
MCHC: 33.2 g/dL (ref 32.0–36.0)
MCV: 90.3 fL (ref 80.0–100.0)
MPV: 10.9 fL (ref 7.5–12.5)
Monocytes Absolute: 385 cells/uL (ref 200–950)
Monocytes Relative: 7 %
Neutro Abs: 3520 cells/uL (ref 1500–7800)
Neutrophils Relative %: 64 %
Platelets: 237 10*3/uL (ref 140–400)
RBC: 3.9 MIL/uL (ref 3.80–5.10)
RDW: 12.9 % (ref 11.0–15.0)
WBC: 5.5 10*3/uL (ref 3.8–10.8)

## 2016-09-19 LAB — COMPLETE METABOLIC PANEL WITH GFR
ALT: 12 U/L (ref 6–29)
AST: 23 U/L (ref 10–35)
Albumin: 3.9 g/dL (ref 3.6–5.1)
Alkaline Phosphatase: 84 U/L (ref 33–130)
BUN: 25 mg/dL (ref 7–25)
CO2: 22 mmol/L (ref 20–31)
Calcium: 9.5 mg/dL (ref 8.6–10.4)
Chloride: 95 mmol/L — ABNORMAL LOW (ref 98–110)
Creat: 1.18 mg/dL — ABNORMAL HIGH (ref 0.60–0.88)
GFR, Est African American: 48 mL/min — ABNORMAL LOW (ref >=60)
GFR, Est Non African American: 42 mL/min — ABNORMAL LOW (ref >=60)
Glucose, Bld: 73 mg/dL (ref 65–99)
Potassium: 4.5 mmol/L (ref 3.5–5.3)
Sodium: 133 mmol/L — ABNORMAL LOW (ref 135–146)
Total Bilirubin: 0.5 mg/dL (ref 0.2–1.2)
Total Protein: 7.3 g/dL (ref 6.1–8.1)

## 2016-09-19 NOTE — Progress Notes (Signed)
Location:  [facility] clinic Provider:  Justine Dines L. Mariea Clonts, D.O., C.M.D. PCP:  Sherrie Mustache, NP  Code Status: DNR Goals of Care:  Advanced Directives 09/19/2016  Does patient have an advance directive? Yes  Type of Advance Directive [place]  Does patient want to make changes to advanced directive? -  Copy of advanced directive(s) in chart? Yes  Would patient like information on creating an advanced directive? -   Chief Complaint  Patient presents with  . Acute Visit    fatigue for several weeks, left hip pain, weakness    HPI: Patient is a 82 y.o. female seen today for an acute visit for fatigue, left hip pain and weakness for several weeks.  She has lost 7 lbs.  Is eating.  sometimes too much.  Discussed that aricept can cause weight loss.    She fell going into the laundry room and has been c/o the left hip ever since.  She landed on that side.  It hurts in any position.  Bothers her when she gets out of bed.  The other day, she couldn't get out of bed and it took her a bit.  When she sits and gets back up, it lets her know.  Feels like it might give out.  She takes it real easy.Can't label the type of pain.  Not radiating.  Worst when stands up.  Takes her tylenol which does decrease the pain some.   Can't give me a rating, just really bad when she gets up.   Takes the tylenol in the morning and if she's going to go to an activity.  Probably just once a day generally.    Discussed prolia again and they do want to go ahead.    Has had shingles vaccine but went somewhere different to get it so not in previous PCP's record.    Past Medical History:  Diagnosis Date  . Abnormal weight loss 05/28/2004  . Anxiety 01/17/2003  . Arthritis    "a little; qwhere" (5/25/20170  . Back pain 08/19/2005   with radiculopathy  . Bradycardia, drug induced   . Cerebral atherosclerosis 09/24/1999  . Chest pain, atypical    cath 05/03/2016 for recurrent CP, mild nonobstructive dx.  Mild to moderate R renal artery stenosis, patent L renal artery  . Dementia    "don't know kind or stage" (05/02/2016)  . Depression 09/06/2002  . Diverticulitis 09/28/2007  . Diverticulosis of colon 06/07/2003  . External hemorrhoids 04/12/2009  . Female climacteric state 03/04/2000  . Formed visual hallucinations    [name]?  Failed Keppra and Depakote  . GERD (gastroesophageal reflux disease)   . Hallucination, visual   . Hypertension   . Hypertension    cath 05/03/2016 Mild to moderate R renal artery stenosis, patent L renal artery  . Leg pain, left   . Macular degeneration   . Malaise and fatigue   . 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 Osteoarthrosis, localized   . Osteoporosis 05/28/2004  . Palpitations 11/16/2001  . Paranoia ([place])   . Paroxysmal a-fib ([place])   . PSVT (paroxysmal supraventricular tachycardia) ([place]) 02/22/2002  . Pure hypercholesterolemia 12/20/1999  . TIA (transient ischemic attack) 2000s?  [name] Urinary incontinence 08/18/2007  . UTI (urinary tract infection)   . Vaginitis, atrophic   . Vertigo, peripheral 10/07/2000    Past Surgical History:  Procedure Laterality Date  . BRAIN SURGERY  2000   Neurocysticercosis ("parasite")  .  CARDIAC CATHETERIZATION N/A 05/03/2016   Procedure: Left Heart Cath and Coronary Angiography;  Surgeon: Sherren Mocha, MD;  Location: [place] [facility];  Service: Cardiovascular;  Laterality: N/A;  . CATARACT EXTRACTION  2013  . CATARACT EXTRACTION, BILATERAL     "not sure if both eyes; feel like it probably was"  . JOINT REPLACEMENT    . PERIPHERAL VASCULAR CATHETERIZATION N/A 05/03/2016   Procedure: Abdominal Aortogram;  Surgeon: Sherren Mocha, MD;  Location: [place] [facility];  Service: Cardiovascular;  Laterality: N/A;  . SHOULDER ARTHROSCOPY W/ ROTATOR CUFF REPAIR Right X 3  . TOTAL KNEE ARTHROPLASTY Left 1990  . VAGINAL HYSTERECTOMY  1960    Allergies  Allergen Reactions  . Amiodarone Itching    . Amoxicillin Other (See Comments)    Unknown- ? Upset stomach  . Demerol [Meperidine] Other (See Comments)    Hallucinations  . Hydralazine Other (See Comments)    Tingling and chest pain   . Lisinopril Cough  . Tape Rash    Can tear the skin, also      Medication List       Accurate as of 09/19/16 11:35 AM. Always use your most recent med list.          acetaminophen 325 MG tablet Commonly known as:  TYLENOL Take 650 mg by mouth every 6 (six) hours as needed for mild pain.   ALPRAZolam 0.5 MG tablet Commonly known as:  XANAX Take 1 tablet (0.5 mg total) by mouth at bedtime as needed for anxiety or sleep.   amLODipine 5 MG tablet Commonly known as:  NORVASC Take 1 tablet (5 mg total) by mouth daily.   apixaban 2.5 MG Tabs tablet Commonly known as:  ELIQUIS Take 1 tablet (2.5 mg total) by mouth 2 (two) times daily.   atorvastatin 40 MG tablet Commonly known as:  LIPITOR Take 1 tablet (40 mg total) by mouth daily at 6 PM.   docusate sodium 100 MG capsule Commonly known as:  COLACE Take 100 mg by mouth 2 (two) times daily.   donepezil 10 MG tablet Commonly known as:  ARICEPT Take 1 tablet (10 mg total) by mouth daily.   hydrochlorothiazide 12.5 MG capsule Commonly known as:  MICROZIDE Take 1 capsule (12.5 mg total) by mouth daily.   losartan 100 MG tablet Commonly known as:  COZAAR Take 1 tablet (100 mg total) by mouth daily.   metoprolol tartrate 25 MG tablet Commonly known as:  LOPRESSOR Take 25 mg by mouth 2 (two) times daily. Hold if pulse goes below 45   omeprazole 20 MG capsule Commonly known as:  PRILOSEC Take 20 mg by mouth daily before breakfast.   polyethylene glycol packet Commonly known as:  MIRALAX / GLYCOLAX Take 17 g by mouth daily as needed.   tobramycin 0.3 % ophthalmic solution Commonly known as:  TOBREX Place 1 drop into the left eye See admin instructions. Use drops when receiving macular degeneration shot into left eye four  times a day for several days   venlafaxine 37.5 MG tablet Commonly known as:  EFFEXOR Take 1 tablet (37.5 mg total) by mouth 2 (two) times daily.   Vitamin D3 2000 units Tabs Take 1 tablet by mouth daily.       Review of Systems:  Review of Systems  Constitutional: Positive for malaise/fatigue and weight loss. Negative for chills and fever.  HENT: Positive for hearing loss.   Eyes: Negative for blurred vision.  Glasses  Respiratory: Negative for cough and shortness of breath.   Cardiovascular: Negative for chest pain, palpitations and leg swelling.  Gastrointestinal: Negative for abdominal pain, blood in stool, constipation and melena.  Genitourinary: Positive for frequency. Negative for dysuria.  Musculoskeletal: Positive for falls and joint pain.       Left hip  Neurological: Positive for weakness. Negative for dizziness.  Endo/Heme/Allergies: Bruises/bleeds easily.  Psychiatric/Behavioral: Positive for memory loss. Negative for depression.    Health Maintenance  Topic Date Due  . ZOSTAVAX  11/10/1989  . INFLUENZA VACCINE  07/09/2016  . TETANUS/TDAP  05/14/2022  . DEXA SCAN  Completed  . PNA vac Low Risk Adult  Completed    Physical Exam: Vitals:   09/19/16 1111  BP: 110/68  Pulse: (!) 48  Temp: 97.9 F (36.6 C)  TempSrc: Oral  SpO2: 98%  Weight: 124 lb (56.2 kg)   Body mass index is 22.68 kg/m. Physical Exam  Constitutional: No distress.  Has lost weight  HENT:  Head: Normocephalic and atraumatic.  Eyes: EOM are normal. Pupils are equal, round, and reactive to light.  glasses  Neck: Neck supple. No JVD present.  Cardiovascular:  irreg irreg  Pulmonary/Chest: Effort normal and breath sounds normal. No respiratory distress.  Abdominal: Soft. Bowel sounds are normal. She exhibits no distension. There is no tenderness. There is no guarding.  Musculoskeletal: Normal range of motion.  Neurological: She is alert.  Oriented to person, place, not time    Skin: Skin is warm and dry. Capillary refill takes less than 2 seconds.  Psychiatric: She has a normal mood and affect.    Labs reviewed: Basic Metabolic Panel:  Recent Labs  12/26/15 1536  01/16/16 1058  03/01/16 0330  04/29/16 1528 05/02/16 0025 05/02/16 0732 05/03/16 0633  NA 139  < > 139  < > 135  < > 133* 129*  --  132*  K 3.0*  < > 4.3  < > 4.1  < > 3.9 3.1*  --  3.7  CL 98*  < > 98  < > 102  < > 94* 91*  --  96*  CO2 28  < > 22  < > 23  --  28 26  --  27  GLUCOSE 100*  < > 80  < > 124*  < > 78 91  --  98  BUN 12  < > 18  < > 22*  < > 14 12  --  9  CREATININE 0.89  < > 0.73  < > 0.92  < > 0.84 0.86  --  0.92  CALCIUM 9.5  < > 9.3  < > 9.0  --  9.6 9.8  --  9.2  MG 1.8  --   --   --  1.8  --   --   --  1.8  --   TSH  --   --  6.250*  --  4.447  --   --   --   --   --   < > = values in this interval not displayed. Liver Function Tests:  Recent Labs  01/16/16 1058 03/01/16 0330 05/02/16 0025  AST 14 25 22   ALT 15 27 14   ALKPHOS 114 91 75  BILITOT <0.2 0.7 0.6  PROT 7.1 6.5 6.8  ALBUMIN 3.9 3.0* 3.4*   No results for input(s): LIPASE, AMYLASE in the last 8760 hours. No results for input(s): AMMONIA in the last 8760 hours. CBC:  Recent  Labs  01/16/16 1058  05/02/16 0025 05/02/16 0732 05/03/16 0130  WBC 4.9  < > 5.8 5.7 5.9  NEUTROABS 3.1  --  3.4  --   --   HGB  --   < > 13.1 13.4 12.9  HCT 35.9  < > 39.2 39.6 37.9  MCV 87  < > 85.0 84.3 85.2  PLT 266  < > 219 223 240  < > = values in this interval not displayed. Lipid Panel:  Recent Labs  05/02/16 0732  CHOL 195  HDL 65  LDLCALC 120*  TRIG 49  CHOLHDL 3.0   Lab Results  Component Value Date   HGBA1C 6.2 (H) 05/02/2016    Assessment/Plan 1. Need for immunization against influenza - Flu Vaccine QUAD 36+ mos PF IM (Fluarix & Fluzone Quad PF)  2. Essential hypertension - bp and pulse running low--may need reduction but hesitant to do this considering her prior bouts of RVR when meds have  been changed - COMPLETE METABOLIC PANEL WITH GFR - CBC with Differential/Platelet  3. Hypotension, unspecified hypotension type - see #2, to be checking bp and hr at home until seen next - COMPLETE METABOLIC PANEL WITH GFR - CBC with Differential/Platelet  4. Left hip pain -cont tylenol for pain, use heat on the area of tenderness  5. Late onset Alzheimer's disease with behavioral disturbance -is progressing gradually, discussed that the aricept may be contributing to her weight loss, but they want to cont this, encouraged supplements  6. Weakness -ongoing, could be related to her bp and hr running low or poor hydration so check labs also - COMPLETE METABOLIC PANEL WITH GFR - CBC with Differential/Platelet  prolia needed.  Labs/tests ordered:   Orders Placed This Encounter  Procedures  . Flu Vaccine QUAD 36+ mos PF IM (Fluarix & Fluzone Quad PF)  . COMPLETE METABOLIC PANEL WITH GFR    [facility]  . CBC with Differential/Platelet   Next appt:  10/08/2016   Hershey Knauer L. Lora Chavers, D.O. [facility] [address], [place] [address] Cell Phone (Mon-Fri 8am-5pm):  [phone] On Call:  [phone] & follow prompts after 5pm & weekends Office Phone:  [phone] Office Fax:  [phone]

## 2016-10-08 ENCOUNTER — Encounter: Payer: Self-pay | Admitting: Nurse Practitioner

## 2016-10-08 ENCOUNTER — Ambulatory Visit (INDEPENDENT_AMBULATORY_CARE_PROVIDER_SITE_OTHER): Payer: Medicare Other | Admitting: Nurse Practitioner

## 2016-10-08 VITALS — BP 128/76 | HR 67 | Temp 97.6°F | Resp 17 | Ht 62.0 in | Wt 138.0 lb

## 2016-10-08 DIAGNOSIS — F0281 Dementia in other diseases classified elsewhere with behavioral disturbance: Secondary | ICD-10-CM | POA: Diagnosis not present

## 2016-10-08 DIAGNOSIS — I248 Other forms of acute ischemic heart disease: Secondary | ICD-10-CM

## 2016-10-08 DIAGNOSIS — F419 Anxiety disorder, unspecified: Secondary | ICD-10-CM

## 2016-10-08 DIAGNOSIS — G4700 Insomnia, unspecified: Secondary | ICD-10-CM | POA: Diagnosis not present

## 2016-10-08 DIAGNOSIS — E871 Hypo-osmolality and hyponatremia: Secondary | ICD-10-CM | POA: Diagnosis not present

## 2016-10-08 DIAGNOSIS — K5901 Slow transit constipation: Secondary | ICD-10-CM | POA: Diagnosis not present

## 2016-10-08 DIAGNOSIS — M25552 Pain in left hip: Secondary | ICD-10-CM | POA: Diagnosis not present

## 2016-10-08 DIAGNOSIS — I10 Essential (primary) hypertension: Secondary | ICD-10-CM | POA: Diagnosis not present

## 2016-10-08 DIAGNOSIS — F02818 Dementia in other diseases classified elsewhere, unspecified severity, with other behavioral disturbance: Secondary | ICD-10-CM

## 2016-10-08 DIAGNOSIS — F418 Other specified anxiety disorders: Secondary | ICD-10-CM | POA: Diagnosis not present

## 2016-10-08 DIAGNOSIS — F329 Major depressive disorder, single episode, unspecified: Secondary | ICD-10-CM

## 2016-10-08 DIAGNOSIS — F32A Depression, unspecified: Secondary | ICD-10-CM

## 2016-10-08 DIAGNOSIS — G301 Alzheimer's disease with late onset: Secondary | ICD-10-CM

## 2016-10-08 LAB — BASIC METABOLIC PANEL WITH GFR
BUN: 24 mg/dL (ref 7–25)
CALCIUM: 9 mg/dL (ref 8.6–10.4)
CO2: 26 mmol/L (ref 20–31)
Chloride: 102 mmol/L (ref 98–110)
Creat: 1.19 mg/dL — ABNORMAL HIGH (ref 0.60–0.88)
GFR, EST AFRICAN AMERICAN: 48 mL/min — AB (ref >=60)
GFR, EST NON AFRICAN AMERICAN: 41 mL/min — AB (ref >=60)
GLUCOSE: 79 mg/dL (ref 65–99)
POTASSIUM: 4.6 mmol/L (ref 3.5–5.3)
Sodium: 138 mmol/L (ref 135–146)

## 2016-10-08 MED ORDER — SUVOREXANT 10 MG PO TABS: 10.0000 mg | ORAL_TABLET | Freq: Every day | ORAL | 0 refills | Status: DC

## 2016-10-08 NOTE — Progress Notes (Signed)
Careteam: Patient Care Team: Lauree Chandler, NP as PCP - General (Geriatric Medicine) Sanda Klein, MD as Consulting Physician (Cardiology) Sherlynn Stalls, MD as Consulting Physician (Ophthalmology) Murriel Hopper, MD as Referring Physician (Student)  Advanced Directive information Does patient have an advance directive?: Yes, Type of Advance Directive: Healthcare Power of Attorney  Allergies  Allergen Reactions  . Amiodarone Itching  . Amoxicillin Other (See Comments)    Unknown- ? Upset stomach  . Demerol [Meperidine] Other (See Comments)    Hallucinations  . Hydralazine Other (See Comments)    Tingling and chest pain   . Lisinopril Cough  . Tape Rash    Can tear the skin, also    Chief Complaint  Patient presents with  . Medical Management of Chronic Issues    3 month routine visit.Labs printed. BP home recordings provided.   . Other    Daughter in room   . Other    Discuss Prolia     HPI: Patient is a 82 y.o. female seen in the office today for routine follow up. Saw Dr Mariea Clonts earlier this month due to weakness and fatigue. HCTZ was stopped due to hyponatremia and low BP.  Blood pressure ranging from 125-148/50-74 Says fatigue comes and goes.  No episodes of being light headed or dizziness.  Weight up from last visit, question if last OV weight was accurate.  Mood has been okay Still complaint of hip hurting her. Not taking tylenol  Pain is worse in the morning and with movement.  Not resting well. Taking xanax before she goes to bed but very fidgety  Review of Systems:  Review of Systems  Constitutional: Negative for chills and fever.  HENT: Negative for tinnitus.   Respiratory: Negative for cough and shortness of breath.   Cardiovascular: Negative for chest pain, palpitations and leg swelling.  Gastrointestinal: Negative for abdominal pain.  Genitourinary: Negative for flank pain and hematuria.  Neurological: Negative for dizziness, tremors, seizures,  syncope, facial asymmetry, speech difficulty, weakness, light-headedness, numbness and headaches.  Psychiatric/Behavioral: Positive for confusion. Negative for hallucinations.       Chronically sees bluish figures with macular    Past Medical History:  Diagnosis Date  . Abnormal weight loss 05/28/2004  . Anxiety 01/17/2003  . Arthritis    "a little; qwhere" (5/25/20170  . Back pain 08/19/2005   with radiculopathy  . Bradycardia, drug induced   . Cerebral atherosclerosis 09/24/1999  . Chest pain, atypical    cath 05/03/2016 for recurrent CP, mild nonobstructive dx. Mild to moderate R renal artery stenosis, patent L renal artery  . Dementia    "don't know kind or stage" (05/02/2016)  . Depression 09/06/2002  . Diverticulitis 09/28/2007  . Diverticulosis of colon 06/07/2003  . External hemorrhoids 04/12/2009  . Female climacteric state 03/04/2000  . Formed visual hallucinations    [name]?  Failed Keppra and Depakote  . GERD (gastroesophageal reflux disease)   . Hallucination, visual   . Hypertension   . Hypertension    cath 05/03/2016 Mild to moderate R renal artery stenosis, patent L renal artery  . Leg pain, left   . Macular degeneration   . Malaise and fatigue   . Neurocysticercosis    Craniotomy at [facility] in early 2000s  . Osteoarthrosis, localized   . Osteoporosis 05/28/2004  . Palpitations 11/16/2001  . Paranoia ([place])   . Paroxysmal a-fib ([place])   . PSVT (paroxysmal supraventricular tachycardia) ([place]) 02/22/2002  . Pure hypercholesterolemia 12/20/1999  .  TIA (transient ischemic attack) 2000s?  [name] Urinary incontinence 08/18/2007  . UTI (urinary tract infection)   . Vaginitis, atrophic   . Vertigo, peripheral 10/07/2000   Past Surgical History:  Procedure Laterality Date  . BRAIN SURGERY  2000   Neurocysticercosis ("parasite")  . CARDIAC CATHETERIZATION N/A 05/03/2016   Procedure: Left Heart Cath and Coronary Angiography;  Surgeon: Sherren Mocha, MD;  Location: [place]  [facility];  Service: Cardiovascular;  Laterality: N/A;  . CATARACT EXTRACTION  2013  . CATARACT EXTRACTION, BILATERAL     "not sure if both eyes; feel like it probably was"  . JOINT REPLACEMENT    . PERIPHERAL VASCULAR CATHETERIZATION N/A 05/03/2016   Procedure: Abdominal Aortogram;  Surgeon: Sherren Mocha, MD;  Location: [place] [facility];  Service: Cardiovascular;  Laterality: N/A;  . SHOULDER ARTHROSCOPY W/ ROTATOR CUFF REPAIR Right X 3  . TOTAL KNEE ARTHROPLASTY Left 1990  . VAGINAL HYSTERECTOMY  1960   Social History:   reports that she has never smoked. She has never used smokeless tobacco. She reports that she does not drink alcohol or use drugs.  Family History  Problem Relation Age of Onset  . Polycystic kidney disease Mother   . Heart attack Father   . Hypertension Sister   . Dementia Neg Hx     Medications: Patient's Medications  New Prescriptions   No medications on file  Previous Medications   ACETAMINOPHEN (TYLENOL) 325 MG TABLET    Take 650 mg by mouth every 6 (six) hours as needed for mild pain.   ALPRAZOLAM (XANAX) 0.5 MG TABLET    Take 1 tablet (0.5 mg total) by mouth at bedtime as needed for anxiety or sleep.   AMLODIPINE (NORVASC) 5 MG TABLET    Take 1 tablet (5 mg total) by mouth daily.   APIXABAN (ELIQUIS) 2.5 MG TABS TABLET    Take 1 tablet (2.5 mg total) by mouth 2 (two) times daily.   ATORVASTATIN (LIPITOR) 40 MG TABLET    Take 1 tablet (40 mg total) by mouth daily at 6 PM.   CHOLECALCIFEROL (VITAMIN D3) 2000 UNITS TABS    Take 1 tablet by mouth daily.   DOCUSATE SODIUM (COLACE) 100 MG CAPSULE    Take 100 mg by mouth 2 (two) times daily.   DONEPEZIL (ARICEPT) 10 MG TABLET    Take 1 tablet (10 mg total) by mouth daily.   LOSARTAN (COZAAR) 100 MG TABLET    Take 1 tablet (100 mg total) by mouth daily.   METOPROLOL TARTRATE (LOPRESSOR) 25 MG TABLET    Take 25 mg by mouth 2 (two) times daily. Hold if pulse goes below 45   OMEPRAZOLE (PRILOSEC) 20 MG CAPSULE     Take 20 mg by mouth daily before breakfast.   POLYETHYLENE GLYCOL (MIRALAX / GLYCOLAX) PACKET    Take 17 g by mouth daily as needed.    TOBRAMYCIN (TOBREX) 0.3 % OPHTHALMIC SOLUTION    Place 1 drop into the left eye See admin instructions. Use drops when receiving macular degeneration shot into left eye four times a day for several days   VENLAFAXINE (EFFEXOR) 37.5 MG TABLET    Take 1 tablet (37.5 mg total) by mouth 2 (two) times daily.  Modified Medications   No medications on file  Discontinued Medications   HYDROCHLOROTHIAZIDE (MICROZIDE) 12.5 MG CAPSULE    Take 1 capsule (12.5 mg total) by mouth daily.     Physical Exam:  Vitals:   10/08/16 1307  BP: 128/76  Pulse: 67  Resp: 17  Temp: 97.6 F (36.4 C)  TempSrc: Oral  SpO2: 97%  Weight: 138 lb (62.6 kg)  Height: _0  (1.575 m)   Body mass index is 25.24 kg/m.  Physical Exam  Constitutional: She appears well-developed and well-nourished. No distress.  HENT:  Head: Normocephalic and atraumatic.  Eyes: EOM are normal. Pupils are equal, round, and reactive to light.  glasses  Neck: Neck supple. No JVD present.  Cardiovascular: Normal rate.  An irregularly irregular rhythm present.  Pulmonary/Chest: Effort normal and breath sounds normal. No respiratory distress.  Abdominal: Soft. Bowel sounds are normal. She exhibits no distension. There is no tenderness.  Musculoskeletal: Normal range of motion. She exhibits no edema.  Neurological: She is alert.  Oriented to person, place, not time  Skin: Skin is warm and dry.  Psychiatric: She has a normal mood and affect.   Labs reviewed: Basic Metabolic Panel:  Recent Labs  12/26/15 1536  01/16/16 1058  03/01/16 0330  05/02/16 0025 05/02/16 0732 05/03/16 0633 09/19/16 1215  NA 139  < > 139  < > 135  < > 129*  --  132* 133*  K 3.0*  < > 4.3  < > 4.1  < > 3.1*  --  3.7 4.5  CL 98*  < > 98  < > 102  < > 91*  --  96* 95*  CO2 28  < > 22  < > 23  < > 26  --  27 22  GLUCOSE  100*  < > 80  < > 124*  < > 91  --  98 73  BUN 12  < > 18  < > 22*  < > 12  --  9 25  CREATININE 0.89  < > 0.73  < > 0.92  < > 0.86  --  0.92 1.18*  CALCIUM 9.5  < > 9.3  < > 9.0  < > 9.8  --  9.2 9.5  MG 1.8  --   --   --  1.8  --   --  1.8  --   --   TSH  --   --  6.250*  --  4.447  --   --   --   --   --   < > = values in this interval not displayed. Liver Function Tests:  Recent Labs  03/01/16 0330 05/02/16 0025 09/19/16 1215  AST _1 ALT _2 ALKPHOS 91 75 84  BILITOT 0.7 0.6 0.5  PROT 6.5 6.8 7.3  ALBUMIN 3.0* 3.4* 3.9   No results for input(s): LIPASE, AMYLASE in the last 8760 hours. No results for input(s): AMMONIA in the last 8760 hours. CBC:  Recent Labs  01/16/16 1058  05/02/16 0025 05/02/16 0732 05/03/16 0130 09/19/16 1215  WBC 4.9  < > 5.8 5.7 5.9 5.5  NEUTROABS 3.1  --  3.4  --   --  3,520  HGB  --   < > 13.1 13.4 12.9 11.7  HCT 35.9  < > 39.2 39.6 37.9 35.2  MCV 87  < > 85.0 84.3 85.2 90.3  PLT 266  < > 219 223 240 237  < > = values in this interval not displayed. Lipid Panel:  Recent Labs  05/02/16 0732  CHOL 195  HDL 65  LDLCALC 120*  TRIG 49  CHOLHDL 3.0   TSH:  Recent Labs  01/16/16 1058 03/01/16 0330  TSH 6.250* 4.447   A1C: Lab Results  Component Value Date   HGBA1C 6.2 (H) 05/02/2016     Assessment/Plan 1. Essential hypertension Blood pressure stable, will cont current regimen, HR low at times. Will leave adjustment of metoprolol to cardiologist due to hx of RVR   2. Left hip pain -worse in the morning, better once she gets up and going. To add tylenol 1 tablet to morning medications to help with morning pain. May use as needed.   3. Late onset Alzheimer's disease with behavioral disturbance -conts on aricept 10 mg po qhs   4. Insomnia, unspecified type -to decrease xanax to 1/2 tablet qhs and then stop if not helping insomnia - Suvorexant (BELSOMRA) 10 MG TABS; Take 10 mg by mouth daily.  Dispense: 30  tablet; Refill: 0  5. Slow transit constipation Not controlled on colace, to use senna s 1-2 times daily for constipation, increase water and activity   6. Anxiety and depression Well controlled on current regimen, will decrease xanax to 0.25 mg qhs at this time because she was taking mostly for sleep   7. Hyponatremia - hctz has been stopped, will follow up labs today - BMP with eGFR  Follow up in 4 weeks.  Carlos American. Harle Battiest  [facility] [phone] 8 am - 5 pm) [phone] (after hours)

## 2016-10-08 NOTE — Patient Instructions (Addendum)
To start Senna S daily for constipation- may use this in place of colace  To take xanax 0.5- 1/2 tablet at night  To add Belsomra 10 mg by mouth at night for sleep to [place]

## 2016-10-21 ENCOUNTER — Ambulatory Visit: Payer: Medicare Other | Admitting: Internal Medicine

## 2016-10-24 ENCOUNTER — Telehealth: Payer: Self-pay | Admitting: *Deleted

## 2016-10-24 NOTE — Telephone Encounter (Signed)
Generally symptom management is all you can do. Cont the tylenol and warm tea  May also use plain saline spray to help thin the sinus drainage.  Make sure to increase hydration during this time, easy to get dehydrated which makes it harder on the body to get ride of the cold

## 2016-10-24 NOTE — Telephone Encounter (Signed)
Patient daughter notified and agreed.  

## 2016-10-24 NOTE — Telephone Encounter (Signed)
Patient daughter, Denice Bors and stated that patient is having sinus drainage and it is irritating her throat. They would like to know what she could take OTC for this. No relief with Tylenol, gargling Listerine or hot tea.  Please Advise.

## 2016-10-30 ENCOUNTER — Ambulatory Visit (INDEPENDENT_AMBULATORY_CARE_PROVIDER_SITE_OTHER): Payer: Medicare Other | Admitting: Cardiovascular Disease

## 2016-10-30 ENCOUNTER — Encounter: Payer: Self-pay | Admitting: Cardiovascular Disease

## 2016-10-30 VITALS — BP 140/62 | HR 55 | Ht 62.0 in | Wt 133.6 lb

## 2016-10-30 DIAGNOSIS — Z7901 Long term (current) use of anticoagulants: Secondary | ICD-10-CM

## 2016-10-30 DIAGNOSIS — I248 Other forms of acute ischemic heart disease: Secondary | ICD-10-CM | POA: Diagnosis not present

## 2016-10-30 DIAGNOSIS — I10 Essential (primary) hypertension: Secondary | ICD-10-CM | POA: Diagnosis not present

## 2016-10-30 DIAGNOSIS — I495 Sick sinus syndrome: Secondary | ICD-10-CM | POA: Diagnosis not present

## 2016-10-30 DIAGNOSIS — I480 Paroxysmal atrial fibrillation: Secondary | ICD-10-CM

## 2016-10-30 NOTE — Progress Notes (Signed)
Patient ID: Brittney Gutierrez, female   DOB: [birth year], 82 y.o.   MRN: [id number]    Cardiology Office Note    Date:  10/30/2016   ID:  Brittney Gutierrez, DOB 03-08-1927, MRN [id number]  PCP:  Lauree Chandler, NP  Cardiologist:   Sanda Klein, MD   No chief complaint on file.   History of Present Illness:  Brittney Gutierrez is a 82 y.o. female with systemic hypertension and problems with both sinus bradycardia and paroxysmal atrial fibrillation with rapid ventricular response. We are trying to adjust her medications and she wishes to avoid pacemaker implantation if possible.   She is anticoagulated with Eliquis. She has not had any more falls since her last appointment. She denies any neurological complaints and has not had overt external bleeding. She denies palpitations due to atrial fibrillation since her last visit. Her blood pressure control is entered frequently and is almost always within normal range, with systolic blood pressure in the 130-140 range but with low diastolic blood pressure usually in the 50s. She has evidence of hypertensive heart disease with left ventricular hypertrophy but has not had overt congestive heart failure. She has standing orders to withhold metoprolol if her heart rate is less than 45 but this has rarely occurred. Usually her heart rate is in the low 50s. At times complains of fatigue in the mornings.  She has a questionable hx of TIA, she has a CHA2DS2Vasc score of at least 4 (if + TIA would be 6).  Myoview in Sept 2016 showed no ischemia. Echo shoed preserved LVF.   Amiodarone was used briefly but was discontinued due to itching. Simultaneous treatment with metoprolol and diltiazem was associated with significant bradycardia. She is currently on metoprolol monotherapy.  Past Medical History:  Diagnosis Date  . Abnormal weight loss 05/28/2004  . Anxiety 01/17/2003  . Arthritis    "a little; qwhere" (5/25/20170  . Back pain 08/19/2005   with  radiculopathy  . Bradycardia, drug induced   . Cerebral atherosclerosis 09/24/1999  . Chest pain, atypical    cath 05/03/2016 for recurrent CP, mild nonobstructive dx. Mild to moderate R renal artery stenosis, patent L renal artery  . Dementia    "don't know kind or stage" (05/02/2016)  . Depression 09/06/2002  . Diverticulitis 09/28/2007  . Diverticulosis of colon 06/07/2003  . External hemorrhoids 04/12/2009  . Female climacteric state 03/04/2000  . Formed visual hallucinations    [name]?  Failed Keppra and Depakote  . GERD (gastroesophageal reflux disease)   . Hallucination, visual   . Hypertension   . Hypertension    cath 05/03/2016 Mild to moderate R renal artery stenosis, patent L renal artery  . Leg pain, left   . Macular degeneration   . Malaise and fatigue   . 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 Osteoarthrosis, localized   . Osteoporosis 05/28/2004  . Palpitations 11/16/2001  . Paranoia ([place])   . Paroxysmal a-fib ([place])   . PSVT (paroxysmal supraventricular tachycardia) ([place]) 02/22/2002  . Pure hypercholesterolemia 12/20/1999  . TIA (transient ischemic attack) 2000s?  [name] Urinary incontinence 08/18/2007  . UTI (urinary tract infection)   . Vaginitis, atrophic   . Vertigo, peripheral 10/07/2000    Past Surgical History:  Procedure Laterality Date  . BRAIN SURGERY  2000   Neurocysticercosis ("parasite")  . CARDIAC CATHETERIZATION N/A 05/03/2016   Procedure: Left Heart Cath and Coronary Angiography;  Surgeon: Sherren Mocha, MD;  Location: [place] [facility];  Service: Cardiovascular;  Laterality: N/A;  . CATARACT EXTRACTION  2013  . CATARACT EXTRACTION, BILATERAL     "not sure if both eyes; feel like it probably was"  . JOINT REPLACEMENT    . PERIPHERAL VASCULAR CATHETERIZATION N/A 05/03/2016   Procedure: Abdominal Aortogram;  Surgeon: Sherren Mocha, MD;  Location: [place] [facility];  Service: Cardiovascular;  Laterality: N/A;  . SHOULDER  ARTHROSCOPY W/ ROTATOR CUFF REPAIR Right X 3  . TOTAL KNEE ARTHROPLASTY Left 1990  . VAGINAL HYSTERECTOMY  1960    Current Medications: Outpatient Medications Prior to Visit  Medication Sig Dispense Refill  . acetaminophen (TYLENOL) 325 MG tablet Take 650 mg by mouth every 6 (six) hours as needed for mild pain.    [name] ALPRAZolam (XANAX) 0.5 MG tablet Take 1 tablet (0.5 mg total) by mouth at bedtime as needed for anxiety or sleep. 90 tablet 3  . amLODipine (NORVASC) 5 MG tablet Take 1 tablet (5 mg total) by mouth daily. 90 tablet 3  . apixaban (ELIQUIS) 2.5 MG TABS tablet Take 1 tablet (2.5 mg total) by mouth 2 (two) times daily. 180 tablet 3  . atorvastatin (LIPITOR) 40 MG tablet Take 1 tablet (40 mg total) by mouth daily at 6 PM. 90 tablet 3  . Cholecalciferol (VITAMIN D3) 2000 units TABS Take 1 tablet by mouth daily.    [name] docusate sodium (COLACE) 100 MG capsule Take 100 mg by mouth 2 (two) times daily.    [name] donepezil (ARICEPT) 10 MG tablet Take 1 tablet (10 mg total) by mouth daily. 90 tablet 3  . losartan (COZAAR) 100 MG tablet Take 1 tablet (100 mg total) by mouth daily. 90 tablet 3  . metoprolol tartrate (LOPRESSOR) 25 MG tablet Take 25 mg by mouth 2 (two) times daily. Hold if pulse goes below 45    . omeprazole (PRILOSEC) 20 MG capsule Take 20 mg by mouth daily before breakfast.    . polyethylene glycol (MIRALAX / GLYCOLAX) packet Take 17 g by mouth daily as needed.     . tobramycin (TOBREX) 0.3 % ophthalmic solution Place 1 drop into the left eye See admin instructions. Use drops when receiving macular degeneration shot into left eye four times a day for several days    . venlafaxine (EFFEXOR) 37.5 MG tablet Take 1 tablet (37.5 mg total) by mouth 2 (two) times daily. 180 tablet 3  . Suvorexant (BELSOMRA) 10 MG TABS Take 10 mg by mouth daily. (Patient not taking: Reported on 10/30/2016) 30 tablet 0   No facility-administered medications prior to visit.      Allergies:   Amiodarone;  Amoxicillin; Demerol [meperidine]; Hydralazine; Lisinopril; and Tape   Social History   Social History  . Marital status: Widowed    Spouse name: N/A  . Number of children: N/A  . Years of education: N/A   Social History Main Topics  . Smoking status: Never Smoker  . Smokeless tobacco: Never Used  . Alcohol use No  . Drug use: No  . Sexual activity: No   Other Topics Concern  . None   Social History Narrative   DIET: None      DO YOU DRINK/EAT THINGS WITH CAFFEINE: yes      MARITAL STATUS: widow      WHAT YEAR WERE YOU MARRIED: 1948      DO YOU LIVE IN A HOUSE, APARTMENT, ASSISTED LIVING, CONDO TRAILER ETC.: Independent Living      IS IT ONE OR  MORE STORIES: 1 story      HOW MANY PERSONS LIVE IN YOUR HOME: 1      DO YOU HAVE PETS IN YOUR HOME: no      CURRENT OR PAST PROFESSION: Book Keeper      DO YOU EXERCISE: no      WHAT TYPE AND HOW OFTEN:     Family History:  The patient's family history includes Heart attack in her father; Hypertension in her sister; Polycystic kidney disease in her mother.   ROS:   Please see the history of present illness.    ROS All other systems reviewed and are negative.   PHYSICAL EXAM:   VS:  BP 140/62 (BP Location: Right Arm, Patient Position: Sitting, Cuff Size: Normal)   Pulse (!) 55   Ht 5\' 2"  (1.575 m)   Wt 133 lb 9.6 oz (60.6 kg)   SpO2 98%   BMI 24.44 kg/m    GEN: Well nourished, well developed, in no acute distress  HEENT: normal  Neck: no JVD, carotid bruits, or masses Cardiac: RRR; no murmurs, rubs, or gallops,no edema  Respiratory:  clear to auscultation bilaterally, normal work of breathing GI: soft, nontender, nondistended, + BS MS: no deformity or atrophy  Skin: warm and dry, no rash Neuro:  Alert and Oriented x 3, Strength and sensation are intact Psych: euthymic mood, full affect  Wt Readings from Last 3 Encounters:  10/30/16 133 lb 9.6 oz (60.6 kg)  10/08/16 138 lb (62.6 kg)  09/19/16 124 lb (56.2  kg)      Studies/Labs Reviewed:   EKG:  EKG is ordered today.   It shows sinus bradycardia with frequent PVCs and voltage criteria for LVH without repolarization changes. Small Q waves in leads V1-V3 seem to be related to nonspecific intraventricular conduction abnormality. QTC 440 ms  Recent Labs: 02/29/2016: B Natriuretic Peptide 607.6 03/01/2016: TSH 4.447 05/02/2016: Magnesium 1.8 09/19/2016: ALT 12; Hemoglobin 11.7; Platelets 237 10/08/2016: BUN 24; Creat 1.19; Potassium 4.6; Sodium 138   Lipid Panel    Component Value Date/Time   CHOL 195 05/02/2016 0732   TRIG 49 05/02/2016 0732   HDL 65 05/02/2016 0732   CHOLHDL 3.0 05/02/2016 0732   VLDL 10 05/02/2016 0732   LDLCALC 120 (H) 05/02/2016 0732     ASSESSMENT:    1. Paroxysmal atrial fibrillation (HCC)   2. Essential hypertension   3. SSS (sick sinus syndrome) ([place])   4. Current use of long term anticoagulation      PLAN:  In order of problems listed above:  1. AFib: At least from a symptom point of view, no recent atrial fibrillation. No embolic events recently and no bleeding complications since adding eliquis. Questionable remote history of TIA. Embolic risk is high, but due to her advanced age and slowly progressive dementia will have to periodically reevaluate the wisdom of chronic anticoagulation. We are avoiding additional rate control or rhythm control medications since she would prefer not to have a pacemaker implanted. 2. HTN: Blood pressure control is good on a combination of amlodipine, hydrochlorothiazide, losartan and metoprolol. She has evidence of hypertensive cardiomyopathy with LVH. BNP has been elevated, but clinically she has not had overt congestive heart failure. 3. SSS: We are honoring her desire for least invasive approach. No syncope. However she is now agreeable to undergo pacemaker implantation if we do not have another solution and if she has recurrent atrial fibrillation with rapid ventricular  response, rather than starting a different antiarrhythmic,  would probably advocate pacemaker implantation and rate control with AV blocking agents in higher doses.   Medication Adjustments/Labs and Tests Ordered: Current medicines are reviewed at length with the patient today.  Concerns regarding medicines are outlined above.  Medication changes, Labs and Tests ordered today are listed in the Patient Instructions below. Patient Instructions  Dr Sallyanne Kuster recommends that you schedule a follow-up appointment in 6 months. You will receive a reminder letter in the mail two months in advance. If you don't receive a letter, please call our office to schedule the follow-up appointment.  If you need a refill on your cardiac medications before your next appointment, please call your pharmacy.    Signed, Sanda Klein, MD  10/30/2016 11:11 AM    [address], [place], [place]  [address] Phone: [phone]; Fax: [phone]

## 2016-10-30 NOTE — Patient Instructions (Signed)
Dr Croitoru recommends that you schedule a follow-up appointment in 6 months. You will receive a reminder letter in the mail two months in advance. If you don't receive a letter, please call our office to schedule the follow-up appointment.  If you need a refill on your cardiac medications before your next appointment, please call your pharmacy. 

## 2016-11-06 DIAGNOSIS — H5203 Hypermetropia, bilateral: Secondary | ICD-10-CM | POA: Diagnosis not present

## 2016-11-06 DIAGNOSIS — H353232 Exudative age-related macular degeneration, bilateral, with inactive choroidal neovascularization: Secondary | ICD-10-CM | POA: Diagnosis not present

## 2016-11-06 DIAGNOSIS — H524 Presbyopia: Secondary | ICD-10-CM | POA: Diagnosis not present

## 2016-11-06 DIAGNOSIS — R41841 Cognitive communication deficit: Secondary | ICD-10-CM | POA: Diagnosis not present

## 2016-11-08 DIAGNOSIS — R41841 Cognitive communication deficit: Secondary | ICD-10-CM | POA: Diagnosis not present

## 2016-11-10 DIAGNOSIS — R41841 Cognitive communication deficit: Secondary | ICD-10-CM | POA: Diagnosis not present

## 2016-11-12 DIAGNOSIS — R41841 Cognitive communication deficit: Secondary | ICD-10-CM | POA: Diagnosis not present

## 2016-11-13 DIAGNOSIS — R41841 Cognitive communication deficit: Secondary | ICD-10-CM | POA: Diagnosis not present

## 2016-11-14 ENCOUNTER — Ambulatory Visit: Payer: Medicare Other | Admitting: Nurse Practitioner

## 2016-11-18 DIAGNOSIS — R41841 Cognitive communication deficit: Secondary | ICD-10-CM | POA: Diagnosis not present

## 2016-11-20 DIAGNOSIS — R41841 Cognitive communication deficit: Secondary | ICD-10-CM | POA: Diagnosis not present

## 2016-11-21 ENCOUNTER — Ambulatory Visit (INDEPENDENT_AMBULATORY_CARE_PROVIDER_SITE_OTHER): Payer: Medicare Other | Admitting: Nurse Practitioner

## 2016-11-21 ENCOUNTER — Encounter: Payer: Self-pay | Admitting: Nurse Practitioner

## 2016-11-21 VITALS — BP 136/70 | HR 60 | Temp 97.5°F | Resp 17 | Ht 62.0 in | Wt 135.2 lb

## 2016-11-21 DIAGNOSIS — G4700 Insomnia, unspecified: Secondary | ICD-10-CM | POA: Diagnosis not present

## 2016-11-21 DIAGNOSIS — I248 Other forms of acute ischemic heart disease: Secondary | ICD-10-CM | POA: Diagnosis not present

## 2016-11-21 DIAGNOSIS — M810 Age-related osteoporosis without current pathological fracture: Secondary | ICD-10-CM

## 2016-11-21 DIAGNOSIS — R42 Dizziness and giddiness: Secondary | ICD-10-CM

## 2016-11-21 DIAGNOSIS — G8929 Other chronic pain: Secondary | ICD-10-CM | POA: Diagnosis not present

## 2016-11-21 DIAGNOSIS — M545 Low back pain: Secondary | ICD-10-CM | POA: Diagnosis not present

## 2016-11-21 DIAGNOSIS — K5901 Slow transit constipation: Secondary | ICD-10-CM

## 2016-11-21 MED ORDER — DENOSUMAB 60 MG/ML ~~LOC~~ SOLN
60.0000 mg | Freq: Once | SUBCUTANEOUS | Status: AC
  Administered 2016-11-21: 60 mg via SUBCUTANEOUS

## 2016-11-21 NOTE — Progress Notes (Signed)
Careteam: Patient Care Team: Lauree Chandler, NP as PCP - General (Geriatric Medicine) Sanda Klein, MD as Consulting Physician (Cardiology) Sherlynn Stalls, MD as Consulting Physician (Ophthalmology) Murriel Hopper, MD as Referring Physician (Student)  Advanced Directive information Does Patient Have a Medical Advance Directive?: Yes, Type of Advance Directive: Healthcare Power of Attorney  Allergies  Allergen Reactions  . Amiodarone Itching  . Amoxicillin Other (See Comments)    Unknown- ? Upset stomach  . Demerol [Meperidine] Other (See Comments)    Hallucinations  . Hydralazine Other (See Comments)    Tingling and chest pain   . Lisinopril Cough  . Tape Rash    Can tear the skin, also    Chief Complaint  Patient presents with  . Medical Management of Chronic Issues    4 week follow up on sleep and constipation. Discuss Prolia     HPI: Patient is a 82 y.o. female seen in the office today for a follow-up up her sleep, constipation, and to discuss Prolia.  Initially the patient tried Remeron again because the Belsomra was "kind of expensive", but started having bad dreams, so she quit taking it. She then tried Belsomra for 5-6 days and was having nightmares, so she quit taking it. She reports she has been trying to do more during the day and is going to exercise. Some nights she sleeps and others she doesn't. She just assumes not take anything and have nights that she doesn't sleep than to take medication that makes her have nightmares.   Unable to tolerate Senna-S daily or every other day as she was having a lot of diarrhea. Patient feels as if her constipation is better - she had 2-3 bowel movements this week. No straining lately.   The patient continues with left hip pain that she reports has started going up to her back. She has been taking the Tylenol every morning but does not feel it is helping. She rates the pain 10/10 first thing in the morning when she is trying  to get up and walk. Unable to describe the pain. Heat not effective. Myoflex was somewhat helpful. Has not worked with PT for the hip pain.  Patient reports she gets very lightheaded in the mornings, so bad that she has to sit down and she feels like she is going to pass out. The daughter reports that when the resident stayed with her last month she checked her BP and HR every morning and multiple says the patient's heart rate was in the low 40s so she did not give her the metoprolol. Currently not taking blood pressure or HR, not holding medication for HR less than 40 at this time.    Review of Systems:  Review of Systems  Constitutional: Negative for activity change, appetite change, chills and fever.  HENT: Negative for tinnitus.   Respiratory: Negative for cough and shortness of breath.   Cardiovascular: Negative for chest pain, palpitations and leg swelling.  Gastrointestinal: Positive for constipation. Negative for abdominal pain and diarrhea.  Genitourinary: Negative for flank pain and hematuria.  Musculoskeletal:       Left hip pain that radiates up to her back.  Neurological: Positive for light-headedness. Negative for dizziness, tremors, seizures, syncope, facial asymmetry, speech difficulty, weakness, numbness and headaches.  Psychiatric/Behavioral: Positive for confusion. Negative for hallucinations.    Past Medical History:  Diagnosis Date  . Abnormal weight loss 05/28/2004  . Anxiety 01/17/2003  . Arthritis    "a little;  qwhere" (5/25/20170  . Back pain 08/19/2005   with radiculopathy  . Bradycardia, drug induced   . Cerebral atherosclerosis 09/24/1999  . Chest pain, atypical    cath 05/03/2016 for recurrent CP, mild nonobstructive dx. Mild to moderate R renal artery stenosis, patent L renal artery  . Dementia    "don't know kind or stage" (05/02/2016)  . Depression 09/06/2002  . Diverticulitis 09/28/2007  . Diverticulosis of colon 06/07/2003  . External hemorrhoids 04/12/2009   . Female climacteric state 03/04/2000  . Formed visual hallucinations    [name]?  Failed Keppra and Depakote  . GERD (gastroesophageal reflux disease)   . Hallucination, visual   . Hypertension   . Hypertension    cath 05/03/2016 Mild to moderate R renal artery stenosis, patent L renal artery  . Leg pain, left   . Macular degeneration   . Malaise and fatigue   . Neurocysticercosis    Craniotomy at [facility] in early 2000s  . Osteoarthrosis, localized   . Osteoporosis 05/28/2004  . Palpitations 11/16/2001  . Paranoia ([place])   . Paroxysmal a-fib ([place])   . PSVT (paroxysmal supraventricular tachycardia) ([place]) 02/22/2002  . Pure hypercholesterolemia 12/20/1999  . TIA (transient ischemic attack) 2000s?  [name] Urinary incontinence 08/18/2007  . UTI (urinary tract infection)   . Vaginitis, atrophic   . Vertigo, peripheral 10/07/2000   Past Surgical History:  Procedure Laterality Date  . BRAIN SURGERY  2000   Neurocysticercosis ("parasite")  . CARDIAC CATHETERIZATION N/A 05/03/2016   Procedure: Left Heart Cath and Coronary Angiography;  Surgeon: Sherren Mocha, MD;  Location: [place] [facility];  Service: Cardiovascular;  Laterality: N/A;  . CATARACT EXTRACTION  2013  . CATARACT EXTRACTION, BILATERAL     "not sure if both eyes; feel like it probably was"  . JOINT REPLACEMENT    . PERIPHERAL VASCULAR CATHETERIZATION N/A 05/03/2016   Procedure: Abdominal Aortogram;  Surgeon: Sherren Mocha, MD;  Location: [place] [facility];  Service: Cardiovascular;  Laterality: N/A;  . SHOULDER ARTHROSCOPY W/ ROTATOR CUFF REPAIR Right X 3  . TOTAL KNEE ARTHROPLASTY Left 1990  . VAGINAL HYSTERECTOMY  1960   Social History:   reports that she has never smoked. She has never used smokeless tobacco. She reports that she does not drink alcohol or use drugs.  Family History  Problem Relation Age of Onset  . Polycystic kidney disease Mother   . Heart attack Father   . Hypertension Sister   . Dementia Neg  Hx     Medications: Patient's Medications  New Prescriptions   No medications on file  Previous Medications   ACETAMINOPHEN (TYLENOL) 325 MG TABLET    Take 650 mg by mouth every 6 (six) hours as needed for mild pain.   AMLODIPINE (NORVASC) 5 MG TABLET    Take 1 tablet (5 mg total) by mouth daily.   APIXABAN (ELIQUIS) 2.5 MG TABS TABLET    Take 1 tablet (2.5 mg total) by mouth 2 (two) times daily.   ATORVASTATIN (LIPITOR) 40 MG TABLET    Take 1 tablet (40 mg total) by mouth daily at 6 PM.   CHOLECALCIFEROL (VITAMIN D3) 2000 UNITS TABS    Take 1 tablet by mouth daily.   DOCUSATE SODIUM (COLACE) 100 MG CAPSULE    Take 100 mg by mouth daily.    DONEPEZIL (ARICEPT) 10 MG TABLET    Take 1 tablet (10 mg total) by mouth daily.   LOSARTAN (COZAAR) 100 MG TABLET  Take 1 tablet (100 mg total) by mouth daily.   METOPROLOL TARTRATE (LOPRESSOR) 25 MG TABLET    Take 25 mg by mouth 2 (two) times daily. Hold if pulse goes below 45   OMEPRAZOLE (PRILOSEC) 20 MG CAPSULE    Take 20 mg by mouth daily before breakfast.   POLYETHYLENE GLYCOL (MIRALAX / GLYCOLAX) PACKET    Take 17 g by mouth daily as needed.    TOBRAMYCIN (TOBREX) 0.3 % OPHTHALMIC SOLUTION    Place 1 drop into the left eye See admin instructions. Use drops when receiving macular degeneration shot into left eye four times a day for several days   VENLAFAXINE (EFFEXOR) 37.5 MG TABLET    Take 1 tablet (37.5 mg total) by mouth 2 (two) times daily.  Modified Medications   No medications on file  Discontinued Medications   ALPRAZOLAM (XANAX) 0.5 MG TABLET    Take 1 tablet (0.5 mg total) by mouth at bedtime as needed for anxiety or sleep.   MIRTAZAPINE (REMERON) 7.5 MG TABLET    Take 7.5 mg by mouth at bedtime.     Physical Exam:  Vitals:   11/21/16 1424  BP: 136/70  Pulse: 60  Resp: 17  Temp: 97.5 F (36.4 C)  TempSrc: Oral  SpO2: 97%  Weight: 135 lb 3.2 oz (61.3 kg)  Height: 5\' 2"  (1.575 m)   Body mass index is 24.73  kg/m.  Physical Exam  Constitutional: She appears well-developed and well-nourished. No distress.  HENT:  Head: Normocephalic and atraumatic.  Eyes: EOM are normal. Pupils are equal, round, and reactive to light.  glasses  Neck: Normal range of motion. Neck supple. No JVD present.  Cardiovascular: Normal rate.  An irregularly irregular rhythm present.  Pulmonary/Chest: Effort normal and breath sounds normal. No respiratory distress.  Abdominal: Soft. Bowel sounds are normal. She exhibits no distension. There is no tenderness.  Musculoskeletal: Normal range of motion. She exhibits no edema.  Neurological: She is alert.  Skin: Skin is warm and dry.  Psychiatric: She has a normal mood and affect.    Labs reviewed: Basic Metabolic Panel:  Recent Labs  12/26/15 1536  01/16/16 1058  03/01/16 0330  05/02/16 0732 05/03/16 0633 09/19/16 1215 10/08/16 1408  NA 139  < > 139  < > 135  < >  --  132* 133* 138  K 3.0*  < > 4.3  < > 4.1  < >  --  3.7 4.5 4.6  CL 98*  < > 98  < > 102  < >  --  96* 95* 102  CO2 28  < > 22  < > 23  < >  --  27 22 26   GLUCOSE 100*  < > 80  < > 124*  < >  --  98 73 79  BUN 12  < > 18  < > 22*  < >  --  9 25 24   CREATININE 0.89  < > 0.73  < > 0.92  < >  --  0.92 1.18* 1.19*  CALCIUM 9.5  < > 9.3  < > 9.0  < >  --  9.2 9.5 9.0  MG 1.8  --   --   --  1.8  --  1.8  --   --   --   TSH  --   --  6.250*  --  4.447  --   --   --   --   --   < > =  values in this interval not displayed. Liver Function Tests:  Recent Labs  03/01/16 0330 05/02/16 0025 09/19/16 1215  AST 25 22 23   ALT 27 14 12   ALKPHOS 91 75 84  BILITOT 0.7 0.6 0.5  PROT 6.5 6.8 7.3  ALBUMIN 3.0* 3.4* 3.9   No results for input(s): LIPASE, AMYLASE in the last 8760 hours. No results for input(s): AMMONIA in the last 8760 hours. CBC:  Recent Labs  01/16/16 1058  05/02/16 0025 05/02/16 0732 05/03/16 0130 09/19/16 1215  WBC 4.9  < > 5.8 5.7 5.9 5.5  NEUTROABS 3.1  --  3.4  --   --   3,520  HGB  --   < > 13.1 13.4 12.9 11.7  HCT 35.9  < > 39.2 39.6 37.9 35.2  MCV 87  < > 85.0 84.3 85.2 90.3  PLT 266  < > 219 223 240 237  < > = values in this interval not displayed. Lipid Panel:  Recent Labs  05/02/16 0732  CHOL 195  HDL 65  LDLCALC 120*  TRIG 49  CHOLHDL 3.0   TSH:  Recent Labs  01/16/16 1058 03/01/16 0330  TSH 6.250* 4.447   A1C: Lab Results  Component Value Date   HGBA1C 6.2 (H) 05/02/2016     Assessment/Plan 1. Chronic left-sided low back pain without sciatica - Ambulatory referral to [facility] for physical therapy.  - Continue morning Tylenol scheduled which has helped and PRN Tylenol.  2. Osteoporosis, unspecified osteoporosis type, unspecified pathological fracture presence - Weight bearing exercises as tolerated.  - denosumab (PROLIA) injection 60 mg; Inject 60 mg into the skin once.  3. Slow transit constipation - Resolved at this time.  - Will try Miralax if constipation returns.   4. Insomnia, unspecified type - Encouraged to establish a bedtime routine.  - Does not wish to take anymore medications due to the nightmares.   5. Episodic lightheadedness - Advised to check BP and HR every morning and to hold Metoprolol for HR < 45 as ordered by Dr. Loletha Grayer (cardiologist)  6. Dementia Has tolerated aricept, discussed possible namenda start at next visit.   Return in 6 weeks for a follow-up for dementia, hip pain and lightheadedness.  Carlos American. Harle Battiest  [facility] [phone] 8 am - 5 pm) [phone] (after hours)

## 2016-11-21 NOTE — Patient Instructions (Signed)
Take your blood pressure and heart rate every day before taking your medication. As ordered by Dr. Loletha Grayer - do not take your Metoprolol if your heart rate is less than 45.   We have placed an order for physical therapy to see if this can help your left hip pain.   Try Miralax 17 G (1 capful) daily if constipation returns.   Establish a bedtime routine to help with sleeping.

## 2016-11-22 DIAGNOSIS — R41841 Cognitive communication deficit: Secondary | ICD-10-CM | POA: Diagnosis not present

## 2016-11-25 DIAGNOSIS — R41841 Cognitive communication deficit: Secondary | ICD-10-CM | POA: Diagnosis not present

## 2016-11-27 ENCOUNTER — Emergency Department (HOSPITAL_COMMUNITY): Payer: Medicare Other

## 2016-11-27 ENCOUNTER — Encounter (HOSPITAL_COMMUNITY): Payer: Self-pay

## 2016-11-27 ENCOUNTER — Emergency Department (HOSPITAL_COMMUNITY)
Admission: EM | Admit: 2016-11-27 | Discharge: 2016-11-27 | Disposition: A | Discharge status: Home / Self Care | Payer: Medicare Other | Attending: Emergency Medicine | Admitting: Emergency Medicine

## 2016-11-27 DIAGNOSIS — Z7901 Long term (current) use of anticoagulants: Secondary | ICD-10-CM | POA: Diagnosis not present

## 2016-11-27 DIAGNOSIS — R0789 Other chest pain: Secondary | ICD-10-CM | POA: Diagnosis not present

## 2016-11-27 DIAGNOSIS — I10 Essential (primary) hypertension: Secondary | ICD-10-CM | POA: Diagnosis not present

## 2016-11-27 DIAGNOSIS — R0689 Other abnormalities of breathing: Secondary | ICD-10-CM | POA: Insufficient documentation

## 2016-11-27 DIAGNOSIS — Z96652 Presence of left artificial knee joint: Secondary | ICD-10-CM | POA: Diagnosis not present

## 2016-11-27 DIAGNOSIS — R41841 Cognitive communication deficit: Secondary | ICD-10-CM | POA: Diagnosis not present

## 2016-11-27 DIAGNOSIS — R002 Palpitations: Secondary | ICD-10-CM

## 2016-11-27 DIAGNOSIS — Z8673 Personal history of transient ischemic attack (TIA), and cerebral infarction without residual deficits: Secondary | ICD-10-CM | POA: Insufficient documentation

## 2016-11-27 DIAGNOSIS — R079 Chest pain, unspecified: Secondary | ICD-10-CM | POA: Diagnosis not present

## 2016-11-27 LAB — CBC WITH DIFFERENTIAL/PLATELET
Basophils Absolute: 0 10*3/uL (ref 0.0–0.1)
Basophils Relative: 0 %
EOS ABS: 0.2 10*3/uL (ref 0.0–0.7)
EOS PCT: 3 %
HCT: 30.8 % — ABNORMAL LOW (ref 36.0–46.0)
Hemoglobin: 9.9 g/dL — ABNORMAL LOW (ref 12.0–15.0)
Lymphocytes Relative: 27 %
Lymphs Abs: 1.6 10*3/uL (ref 0.7–4.0)
MCH: 29.3 pg (ref 26.0–34.0)
MCHC: 32.1 g/dL (ref 30.0–36.0)
MCV: 91.1 fL (ref 78.0–100.0)
Monocytes Absolute: 0.5 10*3/uL (ref 0.1–1.0)
Monocytes Relative: 8 %
Neutro Abs: 3.6 10*3/uL (ref 1.7–7.7)
Neutrophils Relative %: 62 %
PLATELETS: 201 10*3/uL (ref 150–400)
RBC: 3.38 MIL/uL — AB (ref 3.87–5.11)
RDW: 13.8 % (ref 11.5–15.5)
WBC: 6 10*3/uL (ref 4.0–10.5)

## 2016-11-27 LAB — BASIC METABOLIC PANEL
Anion gap: 9 (ref 5–15)
BUN: 18 mg/dL (ref 6–20)
CALCIUM: 8.3 mg/dL — AB (ref 8.9–10.3)
CO2: 21 mmol/L — ABNORMAL LOW (ref 22–32)
CREATININE: 1 mg/dL (ref 0.44–1.00)
Chloride: 107 mmol/L (ref 101–111)
GFR calc Af Amer: 57 mL/min — ABNORMAL LOW (ref >60)
GFR, EST NON AFRICAN AMERICAN: 49 mL/min — AB (ref >60)
Glucose, Bld: 110 mg/dL — ABNORMAL HIGH (ref 65–99)
POTASSIUM: 3.4 mmol/L — AB (ref 3.5–5.1)
SODIUM: 137 mmol/L (ref 135–145)

## 2016-11-27 LAB — I-STAT TROPONIN, ED
Troponin i, poc: 0.01 ng/mL (ref 0.00–0.08)
Troponin i, poc: 0 ng/mL (ref 0.00–0.08)

## 2016-11-27 LAB — BRAIN NATRIURETIC PEPTIDE: B NATRIURETIC PEPTIDE 5: 304.2 pg/mL — AB (ref 0.0–100.0)

## 2016-11-27 NOTE — ED Provider Notes (Signed)
[facility] DEPT Provider Note   CSN: [id number] Arrival date & time: 11/27/16  0216  By signing my name below, I, Arianna Nassar, attest that this documentation has been prepared under the direction and in the presence of Merryl Hacker, MD.  Electronically Signed: Julien Nordmann, ED Scribe. 11/27/16. 4:06 AM.    History   Chief Complaint Chief Complaint  Patient presents with  . Palpitations   The history is provided by the patient and the EMS personnel. No language interpreter was used.   HPI Comments: Brittney Gutierrez is a 82 y.o. female brought in by ambulance, who presents to the Emergency Department complaining of waxing and waning, 5/10, left sided chest pain/palpitations that began around 12 am this morning. She describes her pain as "fluttering". Pt says the pain radiates into her left arm. Pt was laying in bed when she began to have sudden onset.  She reports two prior episodes of similar pain in which she was evaluated for. Pt was administered Nitroglycerin and 324 mg of ASA by EMS which she states relieved the symptoms. She denies SOB, leg swelling, nausea, vomiting. She further denies hx of diabetes mellitus, hyperlipidemia, or any stents placed.  Patient was admitted earlier this year and had a cardiac catheterization in May. She had very minimal coronary artery disease. No interventions at that time. Medical management recommended for hypertension.  Past Medical History:  Diagnosis Date  . Abnormal weight loss 05/28/2004  . Anxiety 01/17/2003  . Arthritis    "a little; qwhere" (5/25/20170  . Back pain 08/19/2005   with radiculopathy  . Bradycardia, drug induced   . Cerebral atherosclerosis 09/24/1999  . Chest pain, atypical    cath 05/03/2016 for recurrent CP, mild nonobstructive dx. Mild to moderate R renal artery stenosis, patent L renal artery  . Dementia    "don't know kind or stage" (05/02/2016)  . Depression 09/06/2002  . Diverticulitis 09/28/2007  .  Diverticulosis of colon 06/07/2003  . External hemorrhoids 04/12/2009  . Female climacteric state 03/04/2000  . Formed visual hallucinations    [name]?  Failed Keppra and Depakote  . GERD (gastroesophageal reflux disease)   . Hallucination, visual   . Hypertension   . Hypertension    cath 05/03/2016 Mild to moderate R renal artery stenosis, patent L renal artery  . Leg pain, left   . Macular degeneration   . Malaise and fatigue   . 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 Osteoarthrosis, localized   . Osteoporosis 05/28/2004  . Palpitations 11/16/2001  . Paranoia ([place])   . Paroxysmal a-fib ([place])   . PSVT (paroxysmal supraventricular tachycardia) ([place]) 02/22/2002  . Pure hypercholesterolemia 12/20/1999  . TIA (transient ischemic attack) 2000s?  [name] Urinary incontinence 08/18/2007  . UTI (urinary tract infection)   . Vaginitis, atrophic   . Vertigo, peripheral 10/07/2000    Patient Active Problem List   Diagnosis Date Noted  . Current use of long term anticoagulation 06/16/2016  . Essential hypertension, malignant   . Chest pain 05/02/2016  . GERD (gastroesophageal reflux disease) 04/18/2016  . Depression 04/18/2016  . SSS (sick sinus syndrome) ([place]) 04/11/2016  . Atrial fibrillation with rapid ventricular response ([place])   . Hypertensive urgency 02/29/2016  . Sinus bradycardia seen on cardiac monitor 01/14/2016  . Aortic insufficiency 01/14/2016  . PAH (pulmonary artery hypertension) 01/14/2016  . Essential hypertension 01/14/2016  . Atrial fibrillation with RVR ([place]) 12/27/2015  . UTI (lower urinary tract infection) 12/27/2015  .  Dementia 12/27/2015  . Hypokalemia 12/27/2015  . Demand ischemia [facility])     Past Surgical History:  Procedure Laterality Date  . BRAIN SURGERY  2000   Neurocysticercosis ("parasite")  . CARDIAC CATHETERIZATION N/A 05/03/2016   Procedure: Left Heart Cath and Coronary Angiography;  Surgeon: Sherren Mocha, MD;  Location: [facility] LAB;  Service: Cardiovascular;  Laterality: N/A;  . CATARACT EXTRACTION  2013  . CATARACT EXTRACTION, BILATERAL     "not sure if both eyes; feel like it probably was"  . JOINT REPLACEMENT    . PERIPHERAL VASCULAR CATHETERIZATION N/A 05/03/2016   Procedure: Abdominal Aortogram;  Surgeon: Sherren Mocha, MD;  Location: [place] [facility];  Service: Cardiovascular;  Laterality: N/A;  . SHOULDER ARTHROSCOPY W/ ROTATOR CUFF REPAIR Right X 3  . TOTAL KNEE ARTHROPLASTY Left 1990  . VAGINAL HYSTERECTOMY  1960    OB History    No data available       Home Medications    Prior to Admission medications   Medication Sig Start Date End Date Taking? Authorizing Provider  acetaminophen (TYLENOL) 325 MG tablet Take 650 mg by mouth every 6 (six) hours as needed for mild pain.    Historical Provider, MD  amLODipine (NORVASC) 5 MG tablet Take 1 tablet (5 mg total) by mouth daily. 06/14/16   Mihai Croitoru, MD  apixaban (ELIQUIS) 2.5 MG TABS tablet Take 1 tablet (2.5 mg total) by mouth 2 (two) times daily. 06/14/16   Mihai Croitoru, MD  atorvastatin (LIPITOR) 40 MG tablet Take 1 tablet (40 mg total) by mouth daily at 6 PM. 06/14/16   Mihai Croitoru, MD  Cholecalciferol (VITAMIN D3) 2000 units TABS Take 1 tablet by mouth daily.    Historical Provider, MD  docusate sodium (COLACE) 100 MG capsule Take 100 mg by mouth daily.     Historical Provider, MD  donepezil (ARICEPT) 10 MG tablet Take 1 tablet (10 mg total) by mouth daily. 05/13/16   Tiffany L Reed, DO  losartan (COZAAR) 100 MG tablet Take 1 tablet (100 mg total) by mouth daily. 06/14/16   Mihai Croitoru, MD  metoprolol tartrate (LOPRESSOR) 25 MG tablet Take 25 mg by mouth 2 (two) times daily. Hold if pulse goes below 45    Historical Provider, MD  omeprazole (PRILOSEC) 20 MG capsule Take 20 mg by mouth daily before breakfast.    Historical Provider, MD  polyethylene glycol (MIRALAX / GLYCOLAX) packet Take 17 g by mouth daily as needed.     Historical  Provider, MD  tobramycin (TOBREX) 0.3 % ophthalmic solution Place 1 drop into the left eye See admin instructions. Use drops when receiving macular degeneration shot into left eye four times a day for several days 03/11/16   Historical Provider, MD  venlafaxine (EFFEXOR) 37.5 MG tablet Take 1 tablet (37.5 mg total) by mouth 2 (two) times daily. 05/13/16   Gayland Curry, DO    Family History Family History  Problem Relation Age of Onset  . Polycystic kidney disease Mother   . Heart attack Father   . Hypertension Sister   . Dementia Neg Hx     Social History Social History  Substance Use Topics  . Smoking status: Never Smoker  . Smokeless tobacco: Never Used  . Alcohol use No     Allergies   Amiodarone; Amoxicillin; Demerol [meperidine]; Hydralazine; Lisinopril; and Tape   Review of Systems Review of Systems  Constitutional: Negative for chills and fever.  Respiratory: Negative for shortness  of breath.   Cardiovascular: Positive for chest pain and palpitations. Negative for leg swelling.  Gastrointestinal: Negative for abdominal pain, nausea and vomiting.  All other systems reviewed and are negative.    Physical Exam Updated Vital Signs BP 148/57   Pulse (!) 55   Resp 22   Ht 5\' 2"  (1.575 m)   Wt 135 lb (61.2 kg)   SpO2 97%   BMI 24.69 kg/m   Physical Exam  Constitutional: She is oriented to person, place, and time.  Elderly, no acute distress  HENT:  Head: Normocephalic and atraumatic.  Cardiovascular: Normal rate, regular rhythm and normal heart sounds.   No murmur heard. Pulmonary/Chest: Effort normal and breath sounds normal. No respiratory distress. She has no wheezes.  Abdominal: Soft. Bowel sounds are normal. There is no tenderness. There is no guarding.  Musculoskeletal: She exhibits no edema.  Neurological: She is alert and oriented to person, place, and time.  Skin: Skin is warm and dry.  Psychiatric: She has a normal mood and affect.  Nursing note  and vitals reviewed.    ED Treatments / Results  DIAGNOSTIC STUDIES: Oxygen Saturation is 98% on RA, normal by my interpretation.  COORDINATION OF CARE:  4:00 AM Discussed treatment plan with pt at bedside and pt agreed to plan.  Labs (all labs ordered are listed, but only abnormal results are displayed) Labs Reviewed  CBC WITH DIFFERENTIAL/PLATELET - Abnormal; Notable for the following:       Result Value   RBC 3.38 (*)    Hemoglobin 9.9 (*)    HCT 30.8 (*)    All other components within normal limits  BASIC METABOLIC PANEL - Abnormal; Notable for the following:    Potassium 3.4 (*)    CO2 21 (*)    Glucose, Bld 110 (*)    Calcium 8.3 (*)    GFR calc non Af Amer 49 (*)    GFR calc Af Amer 57 (*)    All other components within normal limits  BRAIN NATRIURETIC PEPTIDE - Abnormal; Notable for the following:    B Natriuretic Peptide 304.2 (*)    All other components within normal limits  I-STAT TROPOININ, ED  I-STAT TROPOININ, ED    EKG  EKG Interpretation  Date/Time:  Wednesday November 27 2016 02:20:28 EST Ventricular Rate:  57 PR Interval:    QRS Duration: 120 QT Interval:  484 QTC Calculation: 472 R Axis:   -37 Text Interpretation:  Sinus rhythm LVH with IVCD, LAD and secondary repol abnrm Confirmed by Dina Rich  MD, Loma Sousa ([id number]) on 11/27/2016 3:53:56 AM       Radiology Dg Chest Portable 1 View  Result Date: 11/27/2016 CLINICAL DATA:  Chest pain radiating to the left upper extremity, onset around 12 a.m. today. EXAM: PORTABLE CHEST 1 VIEW COMPARISON:  05/06/2016 FINDINGS: A single AP portable view of the chest demonstrates no focal airspace consolidation or alveolar edema. The lungs are grossly clear. There is no large effusion or pneumothorax. There is unchanged mild cardiomegaly. Cardiac and mediastinal contours are otherwise unremarkable. Incidentally noted severe chronic arthropathy of the right shoulder, and chronic fracture deformity of the right clavicle  midshaft. IMPRESSION: Unchanged mild cardiomegaly.  No acute findings. Electronically Signed   By: Andreas Newport M.D.   On: 11/27/2016 03:06    Procedures Procedures (including critical care time)  Medications Ordered in ED Medications - No data to display   Initial Impression / Assessment and Plan / ED Course  I  have reviewed the triage vital signs and the nursing notes.  Pertinent labs & imaging results that were available during my care of the patient were reviewed by me and considered in my medical decision making (see chart for details).  Clinical Course     Patient presents with palpitations. She just describes fluttering chest pain. Currently asymptomatic. She does not appear volume overloaded. She's not atrial fibrillation but has a history of paroxysmal atrial fibrillation. Recent cardiac catheterization with very minimal disease. Initial troponin and EKG reassuring. Delta troponin obtained and reassuring. Chest x-ray shows no evidence of volume overload. Patient has not had any recurrence of symptoms in the ER. She's been on cardiac monitor. Will have patient follow-up very closely with her cardiologist.  After history, exam, and medical workup I feel the patient has been appropriately medically screened and is safe for discharge home. Pertinent diagnoses were discussed with the patient. Patient was given return precautions.   Final Clinical Impressions(s) / ED Diagnoses   Final diagnoses:  Palpitations  Chest pain, unspecified type   I personally performed the services described in this documentation, which was scribed in my presence. The recorded information has been reviewed and is accurate.   New Prescriptions New Prescriptions   No medications on file     Merryl Hacker, MD 11/27/16 [phone]

## 2016-11-27 NOTE — ED Notes (Signed)
ED Provider at bedside. 

## 2016-11-27 NOTE — ED Triage Notes (Signed)
Pt arrived via [facility] c/o left chest and arm pain pt described as "palpitations".  Pain 0/10 on arrival EMS gave 324 ASA and 1 SL Nitro.

## 2016-11-27 NOTE — Discharge Instructions (Signed)
You need to follow-up closely with your cardiologist. Your imaging and workup in the ER are reassuring.

## 2016-11-29 DIAGNOSIS — R41841 Cognitive communication deficit: Secondary | ICD-10-CM | POA: Diagnosis not present

## 2016-12-06 ENCOUNTER — Telehealth: Payer: Self-pay | Admitting: *Deleted

## 2016-12-06 NOTE — Telephone Encounter (Signed)
Needs to really increase hydration, to get Gatorade or Pedialyte to get fluids in her. If blood pressure stays elevated will need OV. Encourage good nutritional choices, try to eat 3 meals a day with small frequent snacks throughout the day as well.

## 2016-12-06 NOTE — Telephone Encounter (Signed)
[name], daughter notified and agreed.  

## 2016-12-06 NOTE — Telephone Encounter (Signed)
[name], daughter called and stated that patient has had the stomach bug and complaining of lightheaded. Had the bug Christmas Eve, Christmas Day and the day after.  Vitals BP 181/66 Pulse 57. No fever.  Would like to know what to do to get her strength back and what Amahia Mad[place] be causing the lightheaded. No available appointments. Please Advise.

## 2016-12-11 ENCOUNTER — Telehealth: Payer: Self-pay | Admitting: *Deleted

## 2016-12-11 DIAGNOSIS — R41841 Cognitive communication deficit: Secondary | ICD-10-CM | POA: Diagnosis not present

## 2016-12-11 DIAGNOSIS — R2681 Unsteadiness on feet: Secondary | ICD-10-CM | POA: Diagnosis not present

## 2016-12-11 DIAGNOSIS — Z9181 History of falling: Secondary | ICD-10-CM | POA: Diagnosis not present

## 2016-12-11 DIAGNOSIS — M25552 Pain in left hip: Secondary | ICD-10-CM | POA: Diagnosis not present

## 2016-12-11 NOTE — Telephone Encounter (Signed)
[name] notified and agreed.  

## 2016-12-11 NOTE — Telephone Encounter (Signed)
If she has someone she would like her to see she can call and make appt, we can not placed referrals for psychiatry this has to be initiated by the patient (or family)  Or she could call Dr. Norma Fredrickson M.D. At [facility], [place], [place] [address] which sees some of our patients.  [phone] - [phone]

## 2016-12-11 NOTE — Telephone Encounter (Signed)
[name], daughter called and stated that patient is having psychotic episodes and it is intensifying at times. Patient is seeing people that are being mean. Daughter's feel like she needs to be referred to a Neuro Psychologist for a complete workup. It is getting bad and they are concerned for a mental break down. Please Advise.  [name] Cell [phone]

## 2016-12-13 DIAGNOSIS — H353213 Exudative age-related macular degeneration, right eye, with inactive scar: Secondary | ICD-10-CM | POA: Diagnosis not present

## 2016-12-13 DIAGNOSIS — Z9181 History of falling: Secondary | ICD-10-CM | POA: Diagnosis not present

## 2016-12-13 DIAGNOSIS — R41841 Cognitive communication deficit: Secondary | ICD-10-CM | POA: Diagnosis not present

## 2016-12-13 DIAGNOSIS — H43813 Vitreous degeneration, bilateral: Secondary | ICD-10-CM | POA: Diagnosis not present

## 2016-12-13 DIAGNOSIS — R2681 Unsteadiness on feet: Secondary | ICD-10-CM | POA: Diagnosis not present

## 2016-12-13 DIAGNOSIS — H5316 Psychophysical visual disturbances: Secondary | ICD-10-CM | POA: Diagnosis not present

## 2016-12-13 DIAGNOSIS — M25552 Pain in left hip: Secondary | ICD-10-CM | POA: Diagnosis not present

## 2016-12-13 DIAGNOSIS — H353222 Exudative age-related macular degeneration, left eye, with inactive choroidal neovascularization: Secondary | ICD-10-CM | POA: Diagnosis not present

## 2016-12-16 DIAGNOSIS — R2681 Unsteadiness on feet: Secondary | ICD-10-CM | POA: Diagnosis not present

## 2016-12-16 DIAGNOSIS — M25552 Pain in left hip: Secondary | ICD-10-CM | POA: Diagnosis not present

## 2016-12-16 DIAGNOSIS — R41841 Cognitive communication deficit: Secondary | ICD-10-CM | POA: Diagnosis not present

## 2016-12-16 DIAGNOSIS — Z9181 History of falling: Secondary | ICD-10-CM | POA: Diagnosis not present

## 2016-12-19 DIAGNOSIS — Z9181 History of falling: Secondary | ICD-10-CM | POA: Diagnosis not present

## 2016-12-19 DIAGNOSIS — M25552 Pain in left hip: Secondary | ICD-10-CM | POA: Diagnosis not present

## 2016-12-19 DIAGNOSIS — R41841 Cognitive communication deficit: Secondary | ICD-10-CM | POA: Diagnosis not present

## 2016-12-19 DIAGNOSIS — R2681 Unsteadiness on feet: Secondary | ICD-10-CM | POA: Diagnosis not present

## 2016-12-20 DIAGNOSIS — Z9181 History of falling: Secondary | ICD-10-CM | POA: Diagnosis not present

## 2016-12-20 DIAGNOSIS — R41841 Cognitive communication deficit: Secondary | ICD-10-CM | POA: Diagnosis not present

## 2016-12-20 DIAGNOSIS — M25552 Pain in left hip: Secondary | ICD-10-CM | POA: Diagnosis not present

## 2016-12-20 DIAGNOSIS — R2681 Unsteadiness on feet: Secondary | ICD-10-CM | POA: Diagnosis not present

## 2016-12-23 DIAGNOSIS — R41841 Cognitive communication deficit: Secondary | ICD-10-CM | POA: Diagnosis not present

## 2016-12-23 DIAGNOSIS — R2681 Unsteadiness on feet: Secondary | ICD-10-CM | POA: Diagnosis not present

## 2016-12-23 DIAGNOSIS — F333 Major depressive disorder, recurrent, severe with psychotic symptoms: Secondary | ICD-10-CM | POA: Diagnosis not present

## 2016-12-23 DIAGNOSIS — Z9181 History of falling: Secondary | ICD-10-CM | POA: Diagnosis not present

## 2016-12-23 DIAGNOSIS — M25552 Pain in left hip: Secondary | ICD-10-CM | POA: Diagnosis not present

## 2016-12-27 DIAGNOSIS — Z9181 History of falling: Secondary | ICD-10-CM | POA: Diagnosis not present

## 2016-12-27 DIAGNOSIS — M25552 Pain in left hip: Secondary | ICD-10-CM | POA: Diagnosis not present

## 2016-12-27 DIAGNOSIS — R41841 Cognitive communication deficit: Secondary | ICD-10-CM | POA: Diagnosis not present

## 2016-12-27 DIAGNOSIS — R2681 Unsteadiness on feet: Secondary | ICD-10-CM | POA: Diagnosis not present

## 2016-12-30 DIAGNOSIS — Z9181 History of falling: Secondary | ICD-10-CM | POA: Diagnosis not present

## 2016-12-30 DIAGNOSIS — R2681 Unsteadiness on feet: Secondary | ICD-10-CM | POA: Diagnosis not present

## 2016-12-30 DIAGNOSIS — R41841 Cognitive communication deficit: Secondary | ICD-10-CM | POA: Diagnosis not present

## 2016-12-30 DIAGNOSIS — M25552 Pain in left hip: Secondary | ICD-10-CM | POA: Diagnosis not present

## 2017-01-01 DIAGNOSIS — R2681 Unsteadiness on feet: Secondary | ICD-10-CM | POA: Diagnosis not present

## 2017-01-01 DIAGNOSIS — R41841 Cognitive communication deficit: Secondary | ICD-10-CM | POA: Diagnosis not present

## 2017-01-01 DIAGNOSIS — M25552 Pain in left hip: Secondary | ICD-10-CM | POA: Diagnosis not present

## 2017-01-01 DIAGNOSIS — Z9181 History of falling: Secondary | ICD-10-CM | POA: Diagnosis not present

## 2017-01-02 ENCOUNTER — Ambulatory Visit (INDEPENDENT_AMBULATORY_CARE_PROVIDER_SITE_OTHER): Payer: Medicare Other | Admitting: Nurse Practitioner

## 2017-01-02 ENCOUNTER — Encounter: Payer: Self-pay | Admitting: Nurse Practitioner

## 2017-01-02 VITALS — BP 142/78 | HR 64 | Temp 97.6°F | Resp 18 | Ht 62.0 in | Wt 137.4 lb

## 2017-01-02 DIAGNOSIS — R42 Dizziness and giddiness: Secondary | ICD-10-CM

## 2017-01-02 DIAGNOSIS — F0281 Dementia in other diseases classified elsewhere with behavioral disturbance: Secondary | ICD-10-CM | POA: Diagnosis not present

## 2017-01-02 DIAGNOSIS — M5430 Sciatica, unspecified side: Secondary | ICD-10-CM

## 2017-01-02 DIAGNOSIS — M549 Dorsalgia, unspecified: Secondary | ICD-10-CM

## 2017-01-02 DIAGNOSIS — G301 Alzheimer's disease with late onset: Secondary | ICD-10-CM

## 2017-01-02 DIAGNOSIS — F02818 Alzheimer's disease with late onset: Secondary | ICD-10-CM

## 2017-01-02 NOTE — Progress Notes (Signed)
Careteam: Patient Care Team: Lauree Chandler, NP as PCP - General (Geriatric Medicine) Sanda Klein, MD as Consulting Physician (Cardiology) Sherlynn Stalls, MD as Consulting Physician (Ophthalmology) Murriel Hopper, MD as Referring Physician (Student)  Advanced Directive information Does Patient Have a Medical Advance Directive?: Yes, Type of Advance Directive: Healthcare Power of Attorney  Allergies  Allergen Reactions  . Amiodarone Itching  . Amoxicillin Other (See Comments)    Unknown- ? Upset stomach  . Demerol [Meperidine] Other (See Comments)    Hallucinations  . Hydralazine Other (See Comments)    Tingling and chest pain   . Lisinopril Cough  . Tape Rash    Can tear the skin, also    Chief Complaint  Patient presents with  . Medical Management of Chronic Issues    6 week routine follow up.      HPI: Patient is a 83 y.o. female seen in the office today to follow up hip pain, lightheadedness and dementia.  Pt reports things are better, not as much lightheadedness.  Not taking blood pressure or HR, just taking the metoprolol.  Eating and drinking well.   PT referral was placed due to back pain and hip pain. Report therapist started treatment but it hurts her to walk and did not want to continue and decided to hold off. Using rollator which she is now using all the time.  Using tylenol twice daily which helps.  A part of the walking club at her facility and this does not cause her any discomfort.  Was not using proper shoes, this has been encouraged.   Saw psychiatrist due to hallucination in the last few weeks. Started Abilify low dose doing well with this. Sleeping better. hallucinations are better   Just got a refill on Aricept  Review of Systems:  Review of Systems  Constitutional: Negative for activity change, appetite change, chills and fever.  HENT: Negative for tinnitus.   Respiratory: Negative for cough and shortness of breath.   Cardiovascular:  Negative for chest pain, palpitations and leg swelling.  Gastrointestinal: Negative for abdominal pain, constipation and diarrhea.  Genitourinary: Negative for flank pain and hematuria.  Musculoskeletal:       Low back pain, radiates down legs at time.   Neurological: Negative for dizziness, tremors, seizures, syncope, facial asymmetry, speech difficulty, weakness, light-headedness, numbness and headaches.  Psychiatric/Behavioral: Positive for confusion. Negative for hallucinations.    Past Medical History:  Diagnosis Date  . Abnormal weight loss 05/28/2004  . Anxiety 01/17/2003  . Arthritis    "a little; qwhere" (5/25/20170  . Back pain 08/19/2005   with radiculopathy  . Bradycardia, drug induced   . Cerebral atherosclerosis 09/24/1999  . Chest pain, atypical    cath 05/03/2016 for recurrent CP, mild nonobstructive dx. Mild to moderate R renal artery stenosis, patent L renal artery  . Dementia    "don't know kind or stage" (05/02/2016)  . Depression 09/06/2002  . Diverticulitis 09/28/2007  . Diverticulosis of colon 06/07/2003  . External hemorrhoids 04/12/2009  . Female climacteric state 03/04/2000  . Formed visual hallucinations    [name]?  Failed Keppra and Depakote  . GERD (gastroesophageal reflux disease)   . Hallucination, visual   . Hypertension   . Hypertension    cath 05/03/2016 Mild to moderate R renal artery stenosis, patent L renal artery  . Leg pain, left   . Macular degeneration   . Malaise and fatigue   . Neurocysticercosis    C[facility]iotomy  at [facility] in early 2000s  . Osteoarthrosis, localized   . Osteoporosis 05/28/2004  . Palpitations 11/16/2001  . Paranoia ([place])   . Paroxysmal a-fib ([place])   . PSVT (paroxysmal supraventricular tachycardia) ([place]) 02/22/2002  . Pure hypercholesterolemia 12/20/1999  . TIA (transient ischemic attack) 2000s?  [name] Urinary incontinence 08/18/2007  . UTI (urinary tract infection)   . Vaginitis, atrophic   . Vertigo, peripheral 10/07/2000    Past Surgical History:  Procedure Laterality Date  . BRAIN SURGERY  2000   Neurocysticercosis ("parasite")  . CARDIAC CATHETERIZATION N/A 05/03/2016   Procedure: Left Heart Cath and Coronary Angiography;  Surgeon: Sherren Mocha, MD;  Location: [place] [facility];  Service: Cardiovascular;  Laterality: N/A;  . CATARACT EXTRACTION  2013  . CATARACT EXTRACTION, BILATERAL     "not sure if both eyes; feel like it probably was"  . JOINT REPLACEMENT    . PERIPHERAL VASCULAR CATHETERIZATION N/A 05/03/2016   Procedure: Abdominal Aortogram;  Surgeon: Sherren Mocha, MD;  Location: [facility];  Service: Cardiovascular;  Laterality: N/A;  . SHOULDER ARTHROSCOPY W/ ROTATOR CUFF REPAIR Right X 3  . TOTAL KNEE ARTHROPLASTY Left 1990  . VAGINAL HYSTERECTOMY  1960   Social History:   reports that she has never smoked. She has never used smokeless tobacco. She reports that she does not drink alcohol or use drugs.  Family History  Problem Relation Age of Onset  . Polycystic kidney disease Mother   . Heart attack Father   . Hypertension Sister   . Dementia Neg Hx     Medications: Patient's Medications  New Prescriptions   No medications on file  Previous Medications   ACETAMINOPHEN (TYLENOL) 325 MG TABLET    Take 650 mg by mouth every 6 (six) hours as needed for mild pain.   AMLODIPINE (NORVASC) 5 MG TABLET    Take 1 tablet (5 mg total) by mouth daily.   APIXABAN (ELIQUIS) 2.5 MG TABS TABLET    Take 1 tablet (2.5 mg total) by mouth 2 (two) times daily.   ARIPIPRAZOLE (ABILIFY) 2 MG TABLET    Take 1 tablet by mouth daily.   ATORVASTATIN (LIPITOR) 40 MG TABLET    Take 1 tablet (40 mg total) by mouth daily at 6 PM.   CHOLECALCIFEROL (VITAMIN D3) 2000 UNITS TABS    Take 1 tablet by mouth daily.   DOCUSATE SODIUM (COLACE) 100 MG CAPSULE    Take 100 mg by mouth daily.    DONEPEZIL (ARICEPT) 10 MG TABLET    Take 1 tablet (10 mg total) by mouth daily.   LOSARTAN (COZAAR) 100 MG TABLET    Take  1 tablet (100 mg total) by mouth daily.   METOPROLOL TARTRATE (LOPRESSOR) 25 MG TABLET    Take 25 mg by mouth 2 (two) times daily. Hold if pulse goes below 45   OMEPRAZOLE (PRILOSEC) 20 MG CAPSULE    Take 20 mg by mouth daily before breakfast.   POLYETHYLENE GLYCOL (MIRALAX / GLYCOLAX) PACKET    Take 17 g by mouth daily as needed.    TOBRAMYCIN (TOBREX) 0.3 % OPHTHALMIC SOLUTION    Place 1 drop into the left eye See admin instructions. Use drops when receiving macular degeneration shot into left eye four times a day for several days   VENLAFAXINE (EFFEXOR) 37.5 MG TABLET    Take 1 tablet (37.5 mg total) by mouth 2 (two) times daily.  Modified Medications   No medications on file  Discontinued  Medications   No medications on file     Physical Exam:  Vitals:   01/02/17 1508  BP: (!) 142/78  Pulse: 64  Resp: 18  Temp: 97.6 F (36.4 C)  TempSrc: Oral  SpO2: 96%  Weight: 137 lb 6.4 oz (62.3 kg)  Height: 5\' 2"  (1.575 m)   Body mass index is 25.13 kg/m.  Physical Exam  Constitutional: She appears well-developed and well-nourished. No distress.  HENT:  Head: Normocephalic and atraumatic.  Eyes: EOM are normal. Pupils are equal, round, and reactive to light.  glasses  Neck: Normal range of motion. Neck supple. No JVD present.  Cardiovascular: Normal rate.  An irregularly irregular rhythm present.  Pulmonary/Chest: Effort normal and breath sounds normal. No respiratory distress.  Abdominal: Soft. Bowel sounds are normal. She exhibits no distension. There is no tenderness.  Musculoskeletal: Normal range of motion. She exhibits no edema or tenderness.  Neurological: She is alert. No cranial nerve deficit. Coordination normal.  Skin: Skin is warm and dry.  Psychiatric: She has a normal mood and affect.    Labs reviewed: Basic Metabolic Panel:  Recent Labs  01/16/16 1058  03/01/16 0330  05/02/16 0732  09/19/16 1215 10/08/16 1408 11/27/16 0240  NA 139  < > 135  < >  --   < >  133* 138 137  K 4.3  < > 4.1  < >  --   < > 4.5 4.6 3.4*  CL 98  < > 102  < >  --   < > 95* 102 107  CO2 22  < > 23  < >  --   < > 22 26 21*  GLUCOSE 80  < > 124*  < >  --   < > 73 79 110*  BUN 18  < > 22*  < >  --   < > 25 24 18   CREATININE 0.73  < > 0.92  < >  --   < > 1.18* 1.19* 1.00  CALCIUM 9.3  < > 9.0  < >  --   < > 9.5 9.0 8.3*  MG  --   --  1.8  --  1.8  --   --   --   --   TSH 6.250*  --  4.447  --   --   --   --   --   --   < > = values in this interval not displayed. Liver Function Tests:  Recent Labs  03/01/16 0330 05/02/16 0025 09/19/16 1215  AST 25 22 23   ALT 27 14 12   ALKPHOS 91 75 84  BILITOT 0.7 0.6 0.5  PROT 6.5 6.8 7.3  ALBUMIN 3.0* 3.4* 3.9   No results for input(s): LIPASE, AMYLASE in the last 8760 hours. No results for input(s): AMMONIA in the last 8760 hours. CBC:  Recent Labs  05/02/16 0025  05/03/16 0130 09/19/16 1215 11/27/16 0240  WBC 5.8  < > 5.9 5.5 6.0  NEUTROABS 3.4  --   --  3,520 3.6  HGB 13.1  < > 12.9 11.7 9.9*  HCT 39.2  < > 37.9 35.2 30.8*  MCV 85.0  < > 85.2 90.3 91.1  PLT 219  < > 240 237 201  < > = values in this interval not displayed. Lipid Panel:  Recent Labs  05/02/16 0732  CHOL 195  HDL 65  LDLCALC 120*  TRIG 49  CHOLHDL 3.0   TSH:  Recent Labs  01/16/16  1058 03/01/16 0330  TSH 6.250* 4.447   A1C: Lab Results  Component Value Date   HGBA1C 6.2 (H) 05/02/2016     Assessment/Plan 1. Back pain with sciatica Back pain impairing therapy. Has hx of injections in back to help with pain but this has been several years back. Still walking with club around facility.  - DG Lumbar Spine Complete; Future for further evaluation, may need referral to neurosurgery for further intervention/possible injection.  Cont tylenol as needed  Handicap form filled out for placard   2. Episodic lightheadedness Has improved, will cont current medication.   3. Late onset Alzheimer's disease with behavioral  disturbance -following with psychiatry at this time.  conts on aricept without adverse effects -discussed started namenda in addition to aricept. Daughter agreeable to this. Sample titration pack of namaric given  Para Cossey K. Harle Battiest  [facility] [phone] 8 am - 5 pm) [phone] (after hours)

## 2017-01-02 NOTE — Patient Instructions (Addendum)
Xray of back ordered STOP aricept when starting Namzaric  Follow up in 3 months for routine follow up

## 2017-01-05 MED ORDER — MEMANTINE HCL-DONEPEZIL HCL 7 & 14 & 21 &28 -10 MG PO C4PK: 1.0000 | EXTENDED_RELEASE_CAPSULE | Freq: Once | ORAL | Status: AC

## 2017-01-06 DIAGNOSIS — R2681 Unsteadiness on feet: Secondary | ICD-10-CM | POA: Diagnosis not present

## 2017-01-06 DIAGNOSIS — R41841 Cognitive communication deficit: Secondary | ICD-10-CM | POA: Diagnosis not present

## 2017-01-06 DIAGNOSIS — Z9181 History of falling: Secondary | ICD-10-CM | POA: Diagnosis not present

## 2017-01-06 DIAGNOSIS — M25552 Pain in left hip: Secondary | ICD-10-CM | POA: Diagnosis not present

## 2017-01-08 DIAGNOSIS — M25552 Pain in left hip: Secondary | ICD-10-CM | POA: Diagnosis not present

## 2017-01-08 DIAGNOSIS — R2681 Unsteadiness on feet: Secondary | ICD-10-CM | POA: Diagnosis not present

## 2017-01-08 DIAGNOSIS — Z9181 History of falling: Secondary | ICD-10-CM | POA: Diagnosis not present

## 2017-01-08 DIAGNOSIS — R41841 Cognitive communication deficit: Secondary | ICD-10-CM | POA: Diagnosis not present

## 2017-01-09 ENCOUNTER — Telehealth: Payer: Self-pay

## 2017-01-09 NOTE — Telephone Encounter (Signed)
[name] (patient's daughter) states patient c/o cold symptoms x 4 days. Patient with chest congestion, cough , and SOB. Patient denies fever or discolored drainage.  Patient did not wish to be seen today. Patient is currently taking Mucinex DM twice daily.   [name] is requesting OTC recommendations, please advise

## 2017-01-09 NOTE — Telephone Encounter (Signed)
Discussed recommendations with Brittney Gutierrez was going out of town. Patient will follow recommendations and if no better by Monday will call for appointment

## 2017-01-09 NOTE — Telephone Encounter (Signed)
Would recommend appt if she is short of breath. Otherwise cont mucinex DM by mouth twice daily with full glass of water. Increase her water which will help break up congestion  Can use tylenol 325 mg by mouth 2 tablets every 6 hours as needed for sore throat/body aches  Again recommend being seen if she worsens or fails to improve after 1 week

## 2017-01-10 DIAGNOSIS — Z9181 History of falling: Secondary | ICD-10-CM | POA: Diagnosis not present

## 2017-01-10 DIAGNOSIS — R2681 Unsteadiness on feet: Secondary | ICD-10-CM | POA: Diagnosis not present

## 2017-01-10 DIAGNOSIS — M25552 Pain in left hip: Secondary | ICD-10-CM | POA: Diagnosis not present

## 2017-01-10 DIAGNOSIS — R41841 Cognitive communication deficit: Secondary | ICD-10-CM | POA: Diagnosis not present

## 2017-01-13 DIAGNOSIS — R41841 Cognitive communication deficit: Secondary | ICD-10-CM | POA: Diagnosis not present

## 2017-01-13 DIAGNOSIS — Z9181 History of falling: Secondary | ICD-10-CM | POA: Diagnosis not present

## 2017-01-13 DIAGNOSIS — M25552 Pain in left hip: Secondary | ICD-10-CM | POA: Diagnosis not present

## 2017-01-13 DIAGNOSIS — R2681 Unsteadiness on feet: Secondary | ICD-10-CM | POA: Diagnosis not present

## 2017-01-14 ENCOUNTER — Telehealth: Payer: Self-pay | Admitting: *Deleted

## 2017-01-14 NOTE — Telephone Encounter (Signed)
[place] Notified and agreed.

## 2017-01-14 NOTE — Telephone Encounter (Signed)
Go ahead with PT at this time

## 2017-01-14 NOTE — Telephone Encounter (Signed)
Brittney Gutierrez, daughter called and wanted to know about the appointment that was suppose to be scheduled for an x-ray, she stated that she hasn't heard anything. I informed her that an order was placed and she could just take patient to [place] and have it done. She stated she was not aware of that and will have her brother take patient today.  She also wanted to know if they should just hold off on PT until patient has the x-ray done. Please Advise.

## 2017-01-18 DIAGNOSIS — R079 Chest pain, unspecified: Secondary | ICD-10-CM | POA: Diagnosis not present

## 2017-01-20 ENCOUNTER — Telehealth: Payer: Self-pay | Admitting: *Deleted

## 2017-01-20 NOTE — Telephone Encounter (Signed)
As long as they are not hurting her this should just be temporary with the "cold" She can take generic Claritin (loratadine) 10 mg daily as needed to help with this

## 2017-01-20 NOTE — Telephone Encounter (Signed)
Patient daughter, [name] called and stated that patient has cold Sx of congestion and cough. They have been giving her Mucinex DM and Tylenol every 4 hours and the Cold Sx are better but patient is complaining of her ears stopped up and "feeling Funny". Daughter doesn't want to bring patient into office. No available appointments, please advise.

## 2017-01-20 NOTE — Telephone Encounter (Signed)
[name], daughter notified and agreed.

## 2017-01-21 ENCOUNTER — Ambulatory Visit
Admission: RE | Admit: 2017-01-21 | Discharge: 2017-01-21 | Disposition: A | Discharge status: Home / Self Care | Payer: Medicare Other | Source: Ambulatory Visit | Attending: Nurse Practitioner | Admitting: Nurse Practitioner

## 2017-01-21 DIAGNOSIS — M5430 Sciatica, unspecified side: Secondary | ICD-10-CM

## 2017-01-21 DIAGNOSIS — M549 Dorsalgia, unspecified: Principal | ICD-10-CM

## 2017-01-21 DIAGNOSIS — M47816 Spondylosis without myelopathy or radiculopathy, lumbar region: Secondary | ICD-10-CM | POA: Diagnosis not present

## 2017-01-23 NOTE — Telephone Encounter (Signed)
Tried calling patient. Left message for a call back.

## 2017-01-23 NOTE — Telephone Encounter (Signed)
May take longer than 3 days to work, if she would like an appt for further evaluation please offer her this. No antibiotics will be prescribed without being evaluated

## 2017-01-23 NOTE — Telephone Encounter (Signed)
Patient's daughter called stating that patient's ears are still stopped up and wants to know if an antibiotic can be prescribed. Patient has been on Claritin for 3 days. Daughter stated that the patient's ears were not hurting. Please Advise.

## 2017-01-23 NOTE — Telephone Encounter (Signed)
Patient's daughter, [name] stated that she will wait until the first of the week to see if the patient's ears clear up before scheduling an appointment.

## 2017-01-27 ENCOUNTER — Telehealth: Payer: Self-pay | Admitting: *Deleted

## 2017-01-27 DIAGNOSIS — M47896 Other spondylosis, lumbar region: Secondary | ICD-10-CM

## 2017-01-27 NOTE — Telephone Encounter (Signed)
Patient's daughter, [name] called requesting patient's x-ray results and wants to know where they are to go from here. Please Advise.

## 2017-01-27 NOTE — Telephone Encounter (Signed)
Sent xray report to you.

## 2017-01-28 DIAGNOSIS — M25552 Pain in left hip: Secondary | ICD-10-CM | POA: Diagnosis not present

## 2017-01-28 DIAGNOSIS — Z9181 History of falling: Secondary | ICD-10-CM | POA: Diagnosis not present

## 2017-01-28 DIAGNOSIS — R41841 Cognitive communication deficit: Secondary | ICD-10-CM | POA: Diagnosis not present

## 2017-01-28 DIAGNOSIS — R2681 Unsteadiness on feet: Secondary | ICD-10-CM | POA: Diagnosis not present

## 2017-01-28 NOTE — Telephone Encounter (Signed)
[name] returned call and LMOM to return her call. Called and LMOM to [facility].

## 2017-01-28 NOTE — Telephone Encounter (Signed)
  [name] Preferred Name:  None Female, 83 y.o., 1927/04/09 Last Weight:  137 lb 6.4 oz (62.3 kg) Weight:  137 lb 6.4 oz (62.3 kg) Phone:  *M:[phone]; [id number] PCP:  Lauree Chandler, NP Language:  English Need Interpreter:  None Allergies:  Amiodarone, Amoxicillin, Demerol [Meperidine], Hydralazine, Lisinopril, Tape Health Maintenance Due?:  None Active FYIs:  General Primary Ins.:  MEDICARE MRN:  [id number] MyChart:  Active Next Appt:  04/07/2017   Message  Received: Wilburn Mylar  Message Contents  Lauree Chandler, NP  Brittney Gutierrez, CMA        Degenerative changes are severe. No acute abnormality. We can refer to neurosurgery for further evaluation (they can evaluate for possible injection)     DG Lumbar Spine Complete  Order: [id number]  Status:  Final result Visible to patient:  Yes (MyChart) Dx:  Back pain with sciatica  Notes Recorded by Lauree Chandler, NP on 01/27/2017 at 8:00 PM EST Degenerative changes are severe. No acute abnormality. We can refer to neurosurgery for further evaluation (they can evaluate for possible injection)  Details   Reading Physician Reading Date Result Priority  [facility]., MD 01/21/2017   Narrative    CLINICAL DATA: Back pain.  EXAM: LUMBAR SPINE - COMPLETE 4+ VIEW  COMPARISON: MRI 08/31/2007.  FINDINGS: Lumbar spine scoliosis concave left. Diffuse multilevel degenerative change. Degenerative changes severe. No acute bony abnormality identified. Aortoiliac atherosclerotic vascular calcification. Right upper quadrant clips.  IMPRESSION: 1. Lumbar spine scoliosis concave left.  2. Multilevel degenerative change lumbar spine. Degenerative changes are severe. No acute abnormality.  3. Aortoiliac atherosclerotic vascular disease.   Electronically Signed By: Marcello Moores Register On: 01/21/2017 14:56       Last Resulted: 01/21/17 14:56

## 2017-01-28 NOTE — Telephone Encounter (Signed)
Patient daughter, [name] notified and agreed. Referral placed.

## 2017-01-28 NOTE — Telephone Encounter (Signed)
LMOM for Jo,daughter, to [facility]

## 2017-01-29 DIAGNOSIS — R2681 Unsteadiness on feet: Secondary | ICD-10-CM | POA: Diagnosis not present

## 2017-01-29 DIAGNOSIS — M25552 Pain in left hip: Secondary | ICD-10-CM | POA: Diagnosis not present

## 2017-01-29 DIAGNOSIS — R41841 Cognitive communication deficit: Secondary | ICD-10-CM | POA: Diagnosis not present

## 2017-01-29 DIAGNOSIS — Z9181 History of falling: Secondary | ICD-10-CM | POA: Diagnosis not present

## 2017-01-31 DIAGNOSIS — Z9181 History of falling: Secondary | ICD-10-CM | POA: Diagnosis not present

## 2017-01-31 DIAGNOSIS — R41841 Cognitive communication deficit: Secondary | ICD-10-CM | POA: Diagnosis not present

## 2017-01-31 DIAGNOSIS — M25552 Pain in left hip: Secondary | ICD-10-CM | POA: Diagnosis not present

## 2017-01-31 DIAGNOSIS — R2681 Unsteadiness on feet: Secondary | ICD-10-CM | POA: Diagnosis not present

## 2017-02-03 ENCOUNTER — Encounter: Payer: Self-pay | Admitting: Nurse Practitioner

## 2017-02-03 ENCOUNTER — Ambulatory Visit (INDEPENDENT_AMBULATORY_CARE_PROVIDER_SITE_OTHER): Payer: Medicare Other | Admitting: Nurse Practitioner

## 2017-02-03 ENCOUNTER — Ambulatory Visit: Payer: Medicare Other | Admitting: Internal Medicine

## 2017-02-03 VITALS — BP 128/78 | HR 67 | Temp 97.4°F | Resp 17 | Ht 62.0 in | Wt 134.4 lb

## 2017-02-03 DIAGNOSIS — M7062 Trochanteric bursitis, left hip: Secondary | ICD-10-CM | POA: Diagnosis not present

## 2017-02-03 DIAGNOSIS — J309 Allergic rhinitis, unspecified: Secondary | ICD-10-CM

## 2017-02-03 DIAGNOSIS — M47816 Spondylosis without myelopathy or radiculopathy, lumbar region: Secondary | ICD-10-CM | POA: Diagnosis not present

## 2017-02-03 DIAGNOSIS — M545 Low back pain: Secondary | ICD-10-CM | POA: Diagnosis not present

## 2017-02-03 DIAGNOSIS — H903 Sensorineural hearing loss, bilateral: Secondary | ICD-10-CM | POA: Diagnosis not present

## 2017-02-03 DIAGNOSIS — M25552 Pain in left hip: Secondary | ICD-10-CM | POA: Diagnosis not present

## 2017-02-03 DIAGNOSIS — F333 Major depressive disorder, recurrent, severe with psychotic symptoms: Secondary | ICD-10-CM | POA: Diagnosis not present

## 2017-02-03 NOTE — Progress Notes (Signed)
Careteam: Patient Care Team: Lauree Chandler, NP as PCP - General (Geriatric Medicine) Sanda Klein, MD as Consulting Physician (Cardiology) Sherlynn Stalls, MD as Consulting Physician (Ophthalmology) Murriel Hopper, MD as Referring Physician (Student)  Advanced Directive information Does Patient Have a Medical Advance Directive?: Yes, Type of Advance Directive: Healthcare Power of Attorney  Allergies  Allergen Reactions  . Amiodarone Itching  . Amoxicillin Other (See Comments)    Unknown- ? Upset stomach  . Demerol [Meperidine] Other (See Comments)    Hallucinations  . Hydralazine Other (See Comments)    Tingling and chest pain   . Lisinopril Cough  . Tape Rash    Can tear the skin, also    Chief Complaint  Patient presents with  . Acute Visit    Pt being seen for bilateral ear discomfort x 2 weeks. Pt states it feels like both ears are full and that makes it hard to hear.      HPI: Patient is a 83 y.o. female seen in the office today due to bilateral ear "discomfort" but no pain. Pt had a cough and cold. Had been taking mucinex and tylenol. Does not hear well out of left ear now. Used to right ear was the only one that gave her trouble.  Called office and started on Claritin 10 mg daily but still feels like she is in a barrel. Overall feeling of fullness has improved. Has hearing aids but doctor told her she did not need them so she put them up.  No pain. Pressure in left ear has improved. Just does not hear good.  No congestion.   Review of Systems:  Review of Systems  Constitutional: Negative for activity change, appetite change, fatigue and fever.  HENT: Positive for hearing loss and rhinorrhea. Negative for congestion, ear discharge, ear pain, postnasal drip, sinus pain, sinus pressure, sneezing and sore throat.   Respiratory: Negative for shortness of breath.   Cardiovascular: Negative for chest pain.    Past Medical History:  Diagnosis Date  . Abnormal  weight loss 05/28/2004  . Anxiety 01/17/2003  . Arthritis    "a little; qwhere" (5/25/20170  . Back pain 08/19/2005   with radiculopathy  . Bradycardia, drug induced   . Cerebral atherosclerosis 09/24/1999  . Chest pain, atypical    cath 05/03/2016 for recurrent CP, mild nonobstructive dx. Mild to moderate R renal artery stenosis, patent L renal artery  . Dementia    "don't know kind or stage" (05/02/2016)  . Depression 09/06/2002  . Diverticulitis 09/28/2007  . Diverticulosis of colon 06/07/2003  . External hemorrhoids 04/12/2009  . Female climacteric state 03/04/2000  . Formed visual hallucinations    [name]?  Failed Keppra and Depakote  . GERD (gastroesophageal reflux disease)   . Hallucination, visual   . Hypertension   . Hypertension    cath 05/03/2016 Mild to moderate R renal artery stenosis, patent L renal artery  . Leg pain, left   . Macular degeneration   . Malaise and fatigue   . 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 Osteoarthrosis, localized   . Osteoporosis 05/28/2004  . Palpitations 11/16/2001  . Paranoia ([place])   . Paroxysmal a-fib ([place])   . PSVT (paroxysmal supraventricular tachycardia) ([place]) 02/22/2002  . Pure hypercholesterolemia 12/20/1999  . TIA (transient ischemic attack) 2000s?  [name] Urinary incontinence 08/18/2007  . UTI (urinary tract infection)   . Vaginitis, atrophic   . Vertigo, peripheral 10/07/2000  Past Surgical History:  Procedure Laterality Date  . BRAIN SURGERY  2000   Neurocysticercosis ("parasite")  . CARDIAC CATHETERIZATION N/A 05/03/2016   Procedure: Left Heart Cath and Coronary Angiography;  Surgeon: Sherren Mocha, MD;  Location: [place] [facility];  Service: Cardiovascular;  Laterality: N/A;  . CATARACT EXTRACTION  2013  . CATARACT EXTRACTION, BILATERAL     "not sure if both eyes; feel like it probably was"  . JOINT REPLACEMENT    . PERIPHERAL VASCULAR CATHETERIZATION N/A 05/03/2016   Procedure: Abdominal Aortogram;   Surgeon: Sherren Mocha, MD;  Location: [place] [facility];  Service: Cardiovascular;  Laterality: N/A;  . SHOULDER ARTHROSCOPY W/ ROTATOR CUFF REPAIR Right X 3  . TOTAL KNEE ARTHROPLASTY Left 1990  . VAGINAL HYSTERECTOMY  1960   Social History:   reports that she has never smoked. She has never used smokeless tobacco. She reports that she does not drink alcohol or use drugs.  Family History  Problem Relation Age of Onset  . Polycystic kidney disease Mother   . Heart attack Father   . Hypertension Sister   . Dementia Neg Hx     Medications: Patient's Medications  New Prescriptions   No medications on file  Previous Medications   ACETAMINOPHEN (TYLENOL) 325 MG TABLET    Take 650 mg by mouth every 6 (six) hours as needed for mild pain.   AMLODIPINE (NORVASC) 5 MG TABLET    Take 1 tablet (5 mg total) by mouth daily.   APIXABAN (ELIQUIS) 2.5 MG TABS TABLET    Take 1 tablet (2.5 mg total) by mouth 2 (two) times daily.   ARIPIPRAZOLE (ABILIFY) 5 MG TABLET    Take 5 mg by mouth daily.   ATORVASTATIN (LIPITOR) 40 MG TABLET    Take 1 tablet (40 mg total) by mouth daily at 6 PM.   CHOLECALCIFEROL (VITAMIN D3) 2000 UNITS TABS    Take 1 tablet by mouth daily.   DOCUSATE SODIUM (COLACE) 100 MG CAPSULE    Take 100 mg by mouth 2 (two) times daily.    HYDROXYZINE (VISTARIL) 25 MG CAPSULE    Take 25 mg by mouth at bedtime.   LOSARTAN (COZAAR) 100 MG TABLET    Take 1 tablet (100 mg total) by mouth daily.   METOPROLOL TARTRATE (LOPRESSOR) 25 MG TABLET    Take 25 mg by mouth 2 (two) times daily. Hold if pulse goes below 45   OMEPRAZOLE (PRILOSEC) 20 MG CAPSULE    Take 20 mg by mouth daily before breakfast.   TOBRAMYCIN (TOBREX) 0.3 % OPHTHALMIC SOLUTION    Place 1 drop into the left eye See admin instructions. Use drops when receiving macular degeneration shot into left eye four times a day for several days   VENLAFAXINE (EFFEXOR) 37.5 MG TABLET    Take 1 tablet (37.5 mg total) by mouth 2 (two) times  daily.  Modified Medications   No medications on file  Discontinued Medications   ARIPIPRAZOLE (ABILIFY) 2 MG TABLET    Take 1 tablet by mouth daily.   POLYETHYLENE GLYCOL (MIRALAX / GLYCOLAX) PACKET    Take 17 g by mouth daily as needed.      Physical Exam:  Vitals:   02/03/17 1546  BP: 128/78  Pulse: 67  Resp: 17  Temp: 97.4 F (36.3 C)  TempSrc: Oral  SpO2: 98%  Weight: 134 lb 6.4 oz (61 kg)  Height: 5\' 2"  (1.575 m)   Body mass index is 24.58 kg/m.  Physical Exam  Constitutional: She appears well-developed and well-nourished. No distress.  HENT:  Head: Normocephalic and atraumatic.  Right Ear: Tympanic membrane, external ear and ear canal normal. Decreased hearing is noted.  Left Ear: External ear and ear canal normal. No drainage, swelling or tenderness. Tympanic membrane is not injected, not scarred, not perforated and not erythematous. Decreased hearing is noted.  Nose: Nose normal.  Mouth/Throat: Oropharynx is clear and moist. No oropharyngeal exudate.  Eyes: EOM are normal. Pupils are equal, round, and reactive to light.  glasses  Neck: Normal range of motion. Neck supple.  Cardiovascular: Normal rate.  An irregularly irregular rhythm present.  Pulmonary/Chest: Effort normal and breath sounds normal. No respiratory distress.  Neurological: She is alert.  Skin: Skin is warm and dry.  Psychiatric: She has a normal mood and affect.    Labs reviewed: Basic Metabolic Panel:  Recent Labs  03/01/16 0330  05/02/16 0732  09/19/16 1215 10/08/16 1408 11/27/16 0240  NA 135  < >  --   < > 133* 138 137  K 4.1  < >  --   < > 4.5 4.6 3.4*  CL 102  < >  --   < > 95* 102 107  CO2 23  < >  --   < > 22 26 21*  GLUCOSE 124*  < >  --   < > 73 79 110*  BUN 22*  < >  --   < > 25 24 18   CREATININE 0.92  < >  --   < > 1.18* 1.19* 1.00  CALCIUM 9.0  < >  --   < > 9.5 9.0 8.3*  MG 1.8  --  1.8  --   --   --   --   TSH 4.447  --   --   --   --   --   --   < > = values in  this interval not displayed. Liver Function Tests:  Recent Labs  03/01/16 0330 05/02/16 0025 09/19/16 1215  AST 25 22 23   ALT 27 14 12   ALKPHOS 91 75 84  BILITOT 0.7 0.6 0.5  PROT 6.5 6.8 7.3  ALBUMIN 3.0* 3.4* 3.9   No results for input(s): LIPASE, AMYLASE in the last 8760 hours. No results for input(s): AMMONIA in the last 8760 hours. CBC:  Recent Labs  05/02/16 0025  05/03/16 0130 09/19/16 1215 11/27/16 0240  WBC 5.8  < > 5.9 5.5 6.0  NEUTROABS 3.4  --   --  3,520 3.6  HGB 13.1  < > 12.9 11.7 9.9*  HCT 39.2  < > 37.9 35.2 30.8*  MCV 85.0  < > 85.2 90.3 91.1  PLT 219  < > 240 237 201  < > = values in this interval not displayed. Lipid Panel:  Recent Labs  05/02/16 0732  CHOL 195  HDL 65  LDLCALC 120*  TRIG 49  CHOLHDL 3.0   TSH:  Recent Labs  03/01/16 0330  TSH 4.447   A1C: Lab Results  Component Value Date   HGBA1C 6.2 (H) 05/02/2016     Assessment/Plan 1. Sensorineural hearing loss (SNHL) of both ears -previously had hearing aids but they are uncomfortable, now hearing worse in left.  - Ambulatory referral to Audiology   2. Allergic rhinitis, unspecified chronicity, unspecified seasonality, unspecified trigger Continue Claritin 10 mg daily  May also use nasal saline spray as needed   Lillianne Eick K. Harle Battiest  [facility] [phone] 8 am - 5 pm) [phone] (after hours)

## 2017-02-03 NOTE — Patient Instructions (Signed)
Cont Claritin 10 mg daily  No infection No wax obstruction

## 2017-02-04 DIAGNOSIS — M25552 Pain in left hip: Secondary | ICD-10-CM | POA: Diagnosis not present

## 2017-02-04 DIAGNOSIS — R41841 Cognitive communication deficit: Secondary | ICD-10-CM | POA: Diagnosis not present

## 2017-02-04 DIAGNOSIS — Z9181 History of falling: Secondary | ICD-10-CM | POA: Diagnosis not present

## 2017-02-04 DIAGNOSIS — R2681 Unsteadiness on feet: Secondary | ICD-10-CM | POA: Diagnosis not present

## 2017-02-05 DIAGNOSIS — R41841 Cognitive communication deficit: Secondary | ICD-10-CM | POA: Diagnosis not present

## 2017-02-05 DIAGNOSIS — R2681 Unsteadiness on feet: Secondary | ICD-10-CM | POA: Diagnosis not present

## 2017-02-05 DIAGNOSIS — Z9181 History of falling: Secondary | ICD-10-CM | POA: Diagnosis not present

## 2017-02-05 DIAGNOSIS — M25552 Pain in left hip: Secondary | ICD-10-CM | POA: Diagnosis not present

## 2017-02-07 DIAGNOSIS — R2681 Unsteadiness on feet: Secondary | ICD-10-CM | POA: Diagnosis not present

## 2017-02-07 DIAGNOSIS — M25552 Pain in left hip: Secondary | ICD-10-CM | POA: Diagnosis not present

## 2017-02-07 DIAGNOSIS — Z9181 History of falling: Secondary | ICD-10-CM | POA: Diagnosis not present

## 2017-02-11 DIAGNOSIS — Z9181 History of falling: Secondary | ICD-10-CM | POA: Diagnosis not present

## 2017-02-11 DIAGNOSIS — R2681 Unsteadiness on feet: Secondary | ICD-10-CM | POA: Diagnosis not present

## 2017-02-11 DIAGNOSIS — M25552 Pain in left hip: Secondary | ICD-10-CM | POA: Diagnosis not present

## 2017-02-13 DIAGNOSIS — M25552 Pain in left hip: Secondary | ICD-10-CM | POA: Diagnosis not present

## 2017-02-13 DIAGNOSIS — Z9181 History of falling: Secondary | ICD-10-CM | POA: Diagnosis not present

## 2017-02-13 DIAGNOSIS — R2681 Unsteadiness on feet: Secondary | ICD-10-CM | POA: Diagnosis not present

## 2017-02-14 DIAGNOSIS — M25552 Pain in left hip: Secondary | ICD-10-CM | POA: Diagnosis not present

## 2017-02-14 DIAGNOSIS — Z9181 History of falling: Secondary | ICD-10-CM | POA: Diagnosis not present

## 2017-02-14 DIAGNOSIS — R2681 Unsteadiness on feet: Secondary | ICD-10-CM | POA: Diagnosis not present

## 2017-02-17 DIAGNOSIS — Z9181 History of falling: Secondary | ICD-10-CM | POA: Diagnosis not present

## 2017-02-17 DIAGNOSIS — R2681 Unsteadiness on feet: Secondary | ICD-10-CM | POA: Diagnosis not present

## 2017-02-17 DIAGNOSIS — M25552 Pain in left hip: Secondary | ICD-10-CM | POA: Diagnosis not present

## 2017-02-20 DIAGNOSIS — R2681 Unsteadiness on feet: Secondary | ICD-10-CM | POA: Diagnosis not present

## 2017-02-20 DIAGNOSIS — M25552 Pain in left hip: Secondary | ICD-10-CM | POA: Diagnosis not present

## 2017-02-20 DIAGNOSIS — Z9181 History of falling: Secondary | ICD-10-CM | POA: Diagnosis not present

## 2017-02-21 DIAGNOSIS — B079 Viral wart, unspecified: Secondary | ICD-10-CM | POA: Diagnosis not present

## 2017-02-21 DIAGNOSIS — M79672 Pain in left foot: Secondary | ICD-10-CM | POA: Diagnosis not present

## 2017-02-21 DIAGNOSIS — M2012 Hallux valgus (acquired), left foot: Secondary | ICD-10-CM | POA: Diagnosis not present

## 2017-02-26 DIAGNOSIS — R2681 Unsteadiness on feet: Secondary | ICD-10-CM | POA: Diagnosis not present

## 2017-02-26 DIAGNOSIS — M25552 Pain in left hip: Secondary | ICD-10-CM | POA: Diagnosis not present

## 2017-02-26 DIAGNOSIS — Z9181 History of falling: Secondary | ICD-10-CM | POA: Diagnosis not present

## 2017-02-28 DIAGNOSIS — M25552 Pain in left hip: Secondary | ICD-10-CM | POA: Diagnosis not present

## 2017-02-28 DIAGNOSIS — R2681 Unsteadiness on feet: Secondary | ICD-10-CM | POA: Diagnosis not present

## 2017-02-28 DIAGNOSIS — Z9181 History of falling: Secondary | ICD-10-CM | POA: Diagnosis not present

## 2017-03-04 DIAGNOSIS — M25552 Pain in left hip: Secondary | ICD-10-CM | POA: Diagnosis not present

## 2017-03-04 DIAGNOSIS — Z9181 History of falling: Secondary | ICD-10-CM | POA: Diagnosis not present

## 2017-03-04 DIAGNOSIS — R2681 Unsteadiness on feet: Secondary | ICD-10-CM | POA: Diagnosis not present

## 2017-03-07 DIAGNOSIS — R2681 Unsteadiness on feet: Secondary | ICD-10-CM | POA: Diagnosis not present

## 2017-03-07 DIAGNOSIS — Z9181 History of falling: Secondary | ICD-10-CM | POA: Diagnosis not present

## 2017-03-07 DIAGNOSIS — M25552 Pain in left hip: Secondary | ICD-10-CM | POA: Diagnosis not present

## 2017-03-10 DIAGNOSIS — F333 Major depressive disorder, recurrent, severe with psychotic symptoms: Secondary | ICD-10-CM | POA: Diagnosis not present

## 2017-03-18 ENCOUNTER — Telehealth: Payer: Self-pay | Admitting: *Deleted

## 2017-03-18 MED ORDER — PANTOPRAZOLE SODIUM 40 MG PO TBEC: 40.0000 mg | DELAYED_RELEASE_TABLET | Freq: Every day | ORAL | 3 refills | Status: DC

## 2017-03-18 NOTE — Telephone Encounter (Signed)
Patient daughter called back and stated patient started taking Namzaric first of April and since then it has caused more Acid Reflux. Taking one Prilosec in the morning. Should she increase the reflux medication. Please Advise.

## 2017-03-18 NOTE — Telephone Encounter (Signed)
Patient daughter notified and agreed. Medication list updated and Rx faxed to pharmacy.

## 2017-03-18 NOTE — Telephone Encounter (Signed)
Patient daughter, [name] called and left message on Clinical Intake and stated that she thinks her mother is having side effects from taking the Namzaric and would like to speak to someone concerning this.    Namzaric is not in patient's current medication list. Reviewed OV note from 01/02/2017 and medication was given as a sample.   Tried calling patient back and LMOM to return call.

## 2017-03-18 NOTE — Telephone Encounter (Signed)
It really should not be causing more GERD, generally if anything namenda helps stomach issues but if she would like we could call in protonix 40 mg daily and stop omeprazole.

## 2017-03-24 ENCOUNTER — Encounter: Payer: Self-pay | Admitting: Internal Medicine

## 2017-03-24 ENCOUNTER — Ambulatory Visit (INDEPENDENT_AMBULATORY_CARE_PROVIDER_SITE_OTHER): Payer: Medicare Other | Admitting: Internal Medicine

## 2017-03-24 VITALS — BP 130/60 | HR 60 | Temp 97.9°F | Wt 133.0 lb

## 2017-03-24 DIAGNOSIS — M25552 Pain in left hip: Secondary | ICD-10-CM | POA: Diagnosis not present

## 2017-03-24 DIAGNOSIS — K219 Gastro-esophageal reflux disease without esophagitis: Secondary | ICD-10-CM | POA: Diagnosis not present

## 2017-03-24 DIAGNOSIS — M5136 Other intervertebral disc degeneration, lumbar region: Secondary | ICD-10-CM | POA: Diagnosis not present

## 2017-03-24 DIAGNOSIS — F0150 Vascular dementia without behavioral disturbance: Secondary | ICD-10-CM | POA: Diagnosis not present

## 2017-03-24 MED ORDER — MEMANTINE HCL-DONEPEZIL HCL 7 & 14 & 21 &28 -10 MG PO C4PK: 1.0000 | EXTENDED_RELEASE_CAPSULE | Freq: Every day | ORAL | 0 refills | Status: DC

## 2017-03-24 MED ORDER — MEMANTINE HCL-DONEPEZIL HCL ER 28-10 MG PO CP24: 1.0000 | ORAL_CAPSULE | Freq: Every day | ORAL | 3 refills | Status: DC

## 2017-03-24 NOTE — Progress Notes (Signed)
Location:  [facility] clinic Provider: Malani Lees L. Mariea Clonts, D.O., C.M.D. PCP:  Sherrie Mustache, NP  Goals of Care:  Advanced Directives 02/03/2017  Does Patient Have a Medical Advance Directive? Yes  Type of Advance Directive [place]  Does patient want to make changes to medical advance directive? -  Copy of [place] in Chart? -  Would patient like information on creating a medical advance directive? -   Chief Complaint  Patient presents with  . Acute Visit    left hip pain x 88mths    HPI: Patient is a 83 y.o. female seen today for an acute visit for  Left hip pain.  She was referred to neurosurgery for injection when she had degenerative disc disease of her lumbar spine.  They said she needed an xray of her hip and had a shot in the bursa of her left hip that was not very long lasting.  PT is hit or miss.  Some worked well, others not so much.  Takes the tylenol each am and is on celebrex also. Had been having more GERD with celebrex so advised to stop since not helping anyway.  Past Medical History:  Diagnosis Date  . Abnormal weight loss 05/28/2004  . Anxiety 01/17/2003  . Arthritis    "a little; qwhere" (5/25/20170  . Back pain 08/19/2005   with radiculopathy  . Bradycardia, drug induced   . Cerebral atherosclerosis 09/24/1999  . Chest pain, atypical    cath 05/03/2016 for recurrent CP, mild nonobstructive dx. Mild to moderate R renal artery stenosis, patent L renal artery  . Dementia    "don't know kind or stage" (05/02/2016)  . Depression 09/06/2002  . Diverticulitis 09/28/2007  . Diverticulosis of colon 06/07/2003  . External hemorrhoids 04/12/2009  . Female climacteric state 03/04/2000  . Formed visual hallucinations    [name]?  Failed Keppra and Depakote  . GERD (gastroesophageal reflux disease)   . Hallucination, visual   . Hypertension   . Hypertension    cath 05/03/2016 Mild to moderate R renal artery stenosis, patent L renal  artery  . Leg pain, left   . Macular degeneration   . Malaise and fatigue   . 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 Osteoarthrosis, localized   . Osteoporosis 05/28/2004  . Palpitations 11/16/2001  . Paranoia ([place])   . Paroxysmal A-fib ([place])   . PSVT (paroxysmal supraventricular tachycardia) ([place]) 02/22/2002  . Pure hypercholesterolemia 12/20/1999  . TIA (transient ischemic attack) 2000s?  [name] Urinary incontinence 08/18/2007  . UTI (urinary tract infection)   . Vaginitis, atrophic   . Vertigo, peripheral 10/07/2000    Past Surgical History:  Procedure Laterality Date  . BRAIN SURGERY  2000   Neurocysticercosis ("parasite")  . CARDIAC CATHETERIZATION N/A 05/03/2016   Procedure: Left Heart Cath and Coronary Angiography;  Surgeon: Sherren Mocha, MD;  Location: [place] CV [facility];  Service: Cardiovascular;  Laterality: N/A;  . CATARACT EXTRACTION  2013  . CATARACT EXTRACTION, BILATERAL     "not sure if both eyes; feel like it probably was"  . JOINT REPLACEMENT    . PERIPHERAL VASCULAR CATHETERIZATION N/A 05/03/2016   Procedure: Abdominal Aortogram;  Surgeon: Sherren Mocha, MD;  Location: [place] [facility];  Service: Cardiovascular;  Laterality: N/A;  . SHOULDER ARTHROSCOPY W/ ROTATOR CUFF REPAIR Right X 3  . TOTAL KNEE ARTHROPLASTY Left 1990  . VAGINAL HYSTERECTOMY  1960    Allergies  Allergen Reactions  . Amiodarone Itching  . Amoxicillin Other (See Comments)    Unknown- ? Upset stomach  . Demerol [Meperidine] Other (See Comments)    Hallucinations  . Hydralazine Other (See Comments)    Tingling and chest pain   . Lisinopril Cough  . Tape Rash    Can tear the skin, also    Allergies as of 03/24/2017      Reactions   Amiodarone Itching   Amoxicillin Other (See Comments)   Unknown- ? Upset stomach   Demerol [meperidine] Other (See Comments)   Hallucinations   Hydralazine Other (See Comments)   Tingling and chest pain    Lisinopril Cough   Tape  Rash   Can tear the skin, also      Medication List       Accurate as of 03/24/17  1:56 PM. Always use your most recent med list.          acetaminophen 325 MG tablet Commonly known as:  TYLENOL Take 650 mg by mouth every 6 (six) hours as needed for mild pain.   amLODipine 5 MG tablet Commonly known as:  NORVASC Take 1 tablet (5 mg total) by mouth daily.   apixaban 2.5 MG Tabs tablet Commonly known as:  ELIQUIS Take 1 tablet (2.5 mg total) by mouth 2 (two) times daily.   atorvastatin 40 MG tablet Commonly known as:  LIPITOR Take 1 tablet (40 mg total) by mouth daily at 6 PM.   celecoxib 200 MG capsule Commonly known as:  CELEBREX Take 200 mg by mouth 2 (two) times daily.   docusate sodium 100 MG capsule Commonly known as:  COLACE Take 100 mg by mouth 2 (two) times daily.   losartan 100 MG tablet Commonly known as:  COZAAR Take 1 tablet (100 mg total) by mouth daily.   metoprolol tartrate 25 MG tablet Commonly known as:  LOPRESSOR Take 25 mg by mouth 2 (two) times daily. Hold if pulse goes below 45   pantoprazole 40 MG tablet Commonly known as:  PROTONIX Take 1 tablet (40 mg total) by mouth daily.   tobramycin 0.3 % ophthalmic solution Commonly known as:  TOBREX Place 1 drop into the left eye See admin instructions. Use drops when receiving macular degeneration shot into left eye four times a day for several days   venlafaxine 37.5 MG tablet Commonly known as:  EFFEXOR Take 1 tablet (37.5 mg total) by mouth 2 (two) times daily.   Vitamin D3 2000 units Tabs Take 1 tablet by mouth daily.       Review of Systems:  Review of Systems  Constitutional: Negative for chills, fever and malaise/fatigue.  HENT: Negative for congestion.   Eyes: Negative for blurred vision.  Respiratory: Negative for shortness of breath.   Cardiovascular: Negative for chest pain and palpitations.  Gastrointestinal: Positive for heartburn. Negative for abdominal pain, blood in  stool, constipation and melena.  Genitourinary: Negative for dysuria.  Musculoskeletal: Positive for back pain and joint pain. Negative for falls.       Left hip/lower back  Neurological: Negative for dizziness, loss of consciousness and weakness.  Endo/Heme/Allergies: Does not bruise/bleed easily.  Psychiatric/Behavioral: Positive for memory loss.    Health Maintenance  Topic Date Due  . INFLUENZA VACCINE  07/09/2017  . TETANUS/TDAP  09/08/2022  . DEXA SCAN  Completed  . PNA vac Low Risk Adult  Completed    Physical Exam: Vitals:   03/24/17 1347  BP: 130/60  Pulse:  60  Temp: 97.9 F (36.6 C)  TempSrc: Oral  SpO2: 95%  Weight: 133 lb (60.3 kg)   Body mass index is 24.33 kg/m. Physical Exam  Constitutional: She is oriented to person, place, and time. She appears well-developed and well-nourished. No distress.  Cardiovascular: Normal rate, regular rhythm, normal heart sounds and intact distal pulses.   Pulmonary/Chest: Effort normal and breath sounds normal. No respiratory distress.  Musculoskeletal: Normal range of motion. She exhibits tenderness.  Left SI joint and lower lumbar region as well as over left greater trochanter, walks holding daughter's arm today  Neurological: She is alert and oriented to person, place, and time.  Skin: Skin is warm and dry.  Psychiatric: She has a normal mood and affect.    Labs reviewed: Basic Metabolic Panel:  Recent Labs  05/02/16 0732  09/19/16 1215 10/08/16 1408 11/27/16 0240  NA  --   < > 133* 138 137  K  --   < > 4.5 4.6 3.4*  CL  --   < > 95* 102 107  CO2  --   < > 22 26 21*  GLUCOSE  --   < > 73 79 110*  BUN  --   < > 25 24 18   CREATININE  --   < > 1.18* 1.19* 1.00  CALCIUM  --   < > 9.5 9.0 8.3*  MG 1.8  --   --   --   --   < > = values in this interval not displayed. Liver Function Tests:  Recent Labs  05/02/16 0025 09/19/16 1215  AST 22 23  ALT 14 12  ALKPHOS 75 84  BILITOT 0.6 0.5  PROT 6.8 7.3    ALBUMIN 3.4* 3.9   No results for input(s): LIPASE, AMYLASE in the last 8760 hours. No results for input(s): AMMONIA in the last 8760 hours. CBC:  Recent Labs  05/02/16 0025  05/03/16 0130 09/19/16 1215 11/27/16 0240  WBC 5.8  < > 5.9 5.5 6.0  NEUTROABS 3.4  --   --  3,520 3.6  HGB 13.1  < > 12.9 11.7 9.9*  HCT 39.2  < > 37.9 35.2 30.8*  MCV 85.0  < > 85.2 90.3 91.1  PLT 219  < > 240 237 201  < > = values in this interval not displayed. Lipid Panel:  Recent Labs  05/02/16 0732  CHOL 195  HDL 65  LDLCALC 120*  TRIG 49  CHOLHDL 3.0   Lab Results  Component Value Date   HGBA1C 6.2 (H) 05/02/2016   Previous lumbar xray reviewed with severe lumbar DDD  Assessment/Plan 1. DDD (degenerative disc disease), lumbar - noted to be severe on xrays previously done, but, by report, when pt saw ortho, they felt she had bursitis of the hip and gave her an injection there so now pt and family think all of her pain is due to her hip and request a referral - AMB referral to orthopedics--will need copies of notes when done  2. Left hip pain - AMB referral to orthopedics  3. Vascular dementia without behavioral disturbance - gradually progressive - Memantine HCl-Donepezil HCl (NAMZARIC) 7 & 14 & 21 &28 -10 MG C4PK; Take 1 capsule by mouth daily.  Dispense: 28 each; Refill: 0  4. Gastroesophageal reflux disease without esophagitis -cont protonix therapy  Labs/tests ordered:   Orders Placed This Encounter  Procedures  . AMB referral to orthopedics    Referral Priority:   Routine  Referral Type:   Consultation    Number of Visits Requested:   1    Next appt:  04/07/2017  Janeshia Ciliberto L. Kathryne Ramella, D.O. [facility] [address], [place] [address] Cell Phone (Mon-Fri 8am-5pm):  [phone] On Call:  [phone] & follow prompts after 5pm & weekends Office Phone:  [phone] Office Fax:  [phone]

## 2017-03-31 ENCOUNTER — Other Ambulatory Visit: Payer: Self-pay | Admitting: Physician Assistant

## 2017-04-01 NOTE — Telephone Encounter (Signed)
Rx request sent to pharmacy.  

## 2017-04-02 ENCOUNTER — Telehealth: Payer: Self-pay | Admitting: Cardiovascular Disease

## 2017-04-02 MED ORDER — METOPROLOL TARTRATE 25 MG PO TABS: 25.0000 mg | ORAL_TABLET | Freq: Two times a day (BID) | ORAL | 1 refills | Status: DC

## 2017-04-02 MED ORDER — METOPROLOL TARTRATE 25 MG PO TABS: 25.0000 mg | ORAL_TABLET | Freq: Two times a day (BID) | ORAL | 5 refills | Status: DC

## 2017-04-02 NOTE — Telephone Encounter (Signed)
Spoke to daughter --- refill authorization sent to local and [place] pharmacy at request.

## 2017-04-02 NOTE — Telephone Encounter (Signed)
New message   Pt daughter is calling. Pt is currently out of medication.   *STAT* If patient is at the pharmacy, call can be transferred to refill team.   1. Which medications need to be refilled? (please list name of each medication and dose if known) metoprolol 25 mg  2. Which pharmacy/location (including street and city if local pharmacy) is medication to be sent to? [facility] on [address]   3. Do they need a 30 day or 90 day supply? 30 day

## 2017-04-07 ENCOUNTER — Ambulatory Visit: Payer: Medicare Other | Admitting: Nurse Practitioner

## 2017-04-08 DIAGNOSIS — H353222 Exudative age-related macular degeneration, left eye, with inactive choroidal neovascularization: Secondary | ICD-10-CM | POA: Diagnosis not present

## 2017-04-08 DIAGNOSIS — H43813 Vitreous degeneration, bilateral: Secondary | ICD-10-CM | POA: Diagnosis not present

## 2017-04-08 DIAGNOSIS — H353213 Exudative age-related macular degeneration, right eye, with inactive scar: Secondary | ICD-10-CM | POA: Diagnosis not present

## 2017-04-09 DIAGNOSIS — H53413 Scotoma involving central area, bilateral: Secondary | ICD-10-CM | POA: Diagnosis not present

## 2017-04-09 DIAGNOSIS — H353213 Exudative age-related macular degeneration, right eye, with inactive scar: Secondary | ICD-10-CM | POA: Diagnosis not present

## 2017-04-15 ENCOUNTER — Encounter: Payer: Self-pay | Admitting: Nurse Practitioner

## 2017-04-15 ENCOUNTER — Ambulatory Visit (INDEPENDENT_AMBULATORY_CARE_PROVIDER_SITE_OTHER): Payer: Medicare Other | Admitting: Nurse Practitioner

## 2017-04-15 VITALS — BP 124/72 | HR 62 | Temp 97.6°F | Resp 17 | Ht 62.0 in | Wt 133.6 lb

## 2017-04-15 DIAGNOSIS — F0150 Vascular dementia without behavioral disturbance: Secondary | ICD-10-CM

## 2017-04-15 DIAGNOSIS — F419 Anxiety disorder, unspecified: Secondary | ICD-10-CM | POA: Diagnosis not present

## 2017-04-15 DIAGNOSIS — F329 Major depressive disorder, single episode, unspecified: Secondary | ICD-10-CM | POA: Diagnosis not present

## 2017-04-15 DIAGNOSIS — F32A Depression, unspecified: Secondary | ICD-10-CM

## 2017-04-15 MED ORDER — SERTRALINE HCL 50 MG PO TABS
ORAL_TABLET | ORAL | 1 refills | Status: DC
Start: 2017-04-15 — End: 2017-05-01

## 2017-04-15 NOTE — Patient Instructions (Addendum)
Start writing every night of positive thoughts- 5 things a night Increase activity/exercise Good nutrition and proper hydration Avoid sweets   Call if any adverse reactions to new medications

## 2017-04-15 NOTE — Progress Notes (Signed)
Careteam: Patient Care Team: Lauree Chandler, NP as PCP - General (Geriatric Medicine) Sanda Klein, MD as Consulting Physician (Cardiology) Sherlynn Stalls, MD as Consulting Physician (Ophthalmology) Murriel Hopper, MD as Referring Physician (Student)  Advanced Directive information Does Patient Have a Medical Advance Directive?: Yes, Type of Advance Directive: Healthcare Power of Attorney  Allergies  Allergen Reactions  . Amiodarone Itching  . Amoxicillin Other (See Comments)    Unknown- ? Upset stomach  . Demerol [Meperidine] Other (See Comments)    Hallucinations  . Hydralazine Other (See Comments)    Tingling and chest pain   . Lisinopril Cough  . Tape Rash    Can tear the skin, also    Chief Complaint  Patient presents with  . Acute Visit    Pt is being seen due to recent changes in behavior, mood, and appetite. Pt states that she just does not feel right.       HPI: Patient is a 83 y.o. female seen in the office today due to changes in mood. Increase in depression. Feels like "everything is wrong with me right now,"  Daughter does not feel like memory has been effected, no worsening of memory. Has been started on namenda and aricept. No side effects noted from this.  Daughter mixed up and didn't get Effexor refilled then stopped giving it to her for a month. Restarted it and after 5 days did not see any improvement so called on call and they told her to stop taking medication therefore she stopped and they are here today for evaluation.  Started felling weak, nauseous, very confused when she restarted medication.  Daughter feels like it was a few events that were building up.  Reports she has been on Effexor for a long time (5 years)  Review of Systems:  Review of Systems  Constitutional: Negative for activity change, appetite change, fatigue and unexpected weight change.  HENT: Positive for hearing loss.   Eyes: Negative.   Respiratory: Negative for cough  and shortness of breath.   Cardiovascular: Negative for chest pain, palpitations and leg swelling.  Gastrointestinal: Negative for abdominal pain, constipation and diarrhea.  Genitourinary: Negative for difficulty urinating and dysuria.  Skin: Negative for color change and wound.  Neurological: Negative for dizziness and weakness.  Psychiatric/Behavioral: Positive for agitation, behavioral problems, confusion, decreased concentration and dysphoric mood. Negative for self-injury. The patient is nervous/anxious.     Past Medical History:  Diagnosis Date  . Abnormal weight loss 05/28/2004  . Anxiety 01/17/2003  . Arthritis    "a little; qwhere" (5/25/20170  . Back pain 08/19/2005   with radiculopathy  . Bradycardia, drug induced   . Cerebral atherosclerosis 09/24/1999  . Chest pain, atypical    cath 05/03/2016 for recurrent CP, mild nonobstructive dx. Mild to moderate R renal artery stenosis, patent L renal artery  . Dementia    "don't know kind or stage" (05/02/2016)  . Depression 09/06/2002  . Diverticulitis 09/28/2007  . Diverticulosis of colon 06/07/2003  . External hemorrhoids 04/12/2009  . Female climacteric state 03/04/2000  . Formed visual hallucinations    [name]?  Failed Keppra and Depakote  . GERD (gastroesophageal reflux disease)   . Hallucination, visual   . Hypertension   . Hypertension    cath 05/03/2016 Mild to moderate R renal artery stenosis, patent L renal artery  . Leg pain, left   . Macular degeneration   . Malaise and fatigue   . Neurocysticercosis  Craniotomy at [facility] in early 2000s  . Osteoarthrosis, localized   . Osteoporosis 05/28/2004  . Palpitations 11/16/2001  . Paranoia ([place])   . Paroxysmal A-fib ([place])   . PSVT (paroxysmal supraventricular tachycardia) ([place]) 02/22/2002  . Pure hypercholesterolemia 12/20/1999  . TIA (transient ischemic attack) 2000s?  [name] Urinary incontinence 08/18/2007  . UTI (urinary tract infection)   . Vaginitis, atrophic   .  Vertigo, peripheral 10/07/2000   Past Surgical History:  Procedure Laterality Date  . BRAIN SURGERY  2000   Neurocysticercosis ("parasite")  . CARDIAC CATHETERIZATION N/A 05/03/2016   Procedure: Left Heart Cath and Coronary Angiography;  Surgeon: Sherren Mocha, MD;  Location: [place] [facility];  Service: Cardiovascular;  Laterality: N/A;  . CATARACT EXTRACTION  2013  . CATARACT EXTRACTION, BILATERAL     "not sure if both eyes; feel like it probably was"  . JOINT REPLACEMENT    . PERIPHERAL VASCULAR CATHETERIZATION N/A 05/03/2016   Procedure: Abdominal Aortogram;  Surgeon: Sherren Mocha, MD;  Location: [place] [facility] LAB;  Service: Cardiovascular;  Laterality: N/A;  . SHOULDER ARTHROSCOPY W/ ROTATOR CUFF REPAIR Right X 3  . TOTAL KNEE ARTHROPLASTY Left 1990  . VAGINAL HYSTERECTOMY  1960   Social History:   reports that she has never smoked. She has never used smokeless tobacco. She reports that she does not drink alcohol or use drugs.  Family History  Problem Relation Age of Onset  . Polycystic kidney disease Mother   . Heart attack Father   . Hypertension Sister   . Dementia Neg Hx     Medications: Patient's Medications  New Prescriptions   No medications on file  Previous Medications   ACETAMINOPHEN (TYLENOL) 325 MG TABLET    Take 650 mg by mouth every 6 (six) hours as needed for mild pain.   AMLODIPINE (NORVASC) 5 MG TABLET    Take 1 tablet (5 mg total) by mouth daily.   APIXABAN (ELIQUIS) 2.5 MG TABS TABLET    Take 1 tablet (2.5 mg total) by mouth 2 (two) times daily.   ATORVASTATIN (LIPITOR) 40 MG TABLET    Take 1 tablet (40 mg total) by mouth daily at 6 PM.   CHOLECALCIFEROL (VITAMIN D3) 2000 UNITS TABS    Take 1 tablet by mouth daily.   DOCUSATE SODIUM (COLACE) 100 MG CAPSULE    Take 100 mg by mouth 2 (two) times daily.    LOSARTAN (COZAAR) 100 MG TABLET    Take 1 tablet (100 mg total) by mouth daily.   MELATONIN 3 MG TABS    Take 1 tablet by mouth at bedtime.    MEMANTINE HCL-DONEPEZIL HCL (NAMZARIC) 28-10 MG CP24    Take 1 capsule by mouth daily.   METOPROLOL TARTRATE (LOPRESSOR) 25 MG TABLET    Take 1 tablet (25 mg total) by mouth 2 (two) times daily.   PANTOPRAZOLE (PROTONIX) 40 MG TABLET    Take 1 tablet (40 mg total) by mouth daily.   TOBRAMYCIN (TOBREX) 0.3 % OPHTHALMIC SOLUTION    Place 1 drop into the left eye See admin instructions. Use drops when receiving macular degeneration shot into left eye four times a day for several days   VENLAFAXINE (EFFEXOR) 37.5 MG TABLET    Take 1 tablet (37.5 mg total) by mouth 2 (two) times daily.  Modified Medications   No medications on file  Discontinued Medications   MEMANTINE HCL-DONEPEZIL HCL (NAMZARIC) 7 & 14 & 21 &28 -10 MG C4PK  Take 1 capsule by mouth daily.     Physical Exam:  Vitals:   04/15/17 1028  BP: 124/72  Pulse: 62  Resp: 17  Temp: 97.6 F (36.4 C)  TempSrc: Oral  SpO2: 96%  Weight: 133 lb 9.6 oz (60.6 kg)  Height: 5\' 2"  (1.575 m)   Body mass index is 24.44 kg/m.  Physical Exam  Constitutional: She appears well-developed and well-nourished. No distress.  HENT:  Head: Normocephalic and atraumatic.  Nose: Nose normal.  Mouth/Throat: Oropharynx is clear and moist. No oropharyngeal exudate.  Eyes: EOM are normal. Pupils are equal, round, and reactive to light.  glasses  Neck: Normal range of motion. Neck supple.  Cardiovascular: Normal rate.  An irregularly irregular rhythm present.  Pulmonary/Chest: Effort normal and breath sounds normal. No respiratory distress.  Abdominal: Soft. Bowel sounds are normal. She exhibits no distension. There is no tenderness.  Neurological: She is alert.  Skin: Skin is warm and dry.  Psychiatric: She has a normal mood and affect.    Labs reviewed: Basic Metabolic Panel:  Recent Labs  05/02/16 0732  09/19/16 1215 10/08/16 1408 11/27/16 0240  NA  --   < > 133* 138 137  K  --   < > 4.5 4.6 3.4*  CL  --   < > 95* 102 107  CO2  --    < > 22 26 21*  GLUCOSE  --   < > 73 79 110*  BUN  --   < > 25 24 18   CREATININE  --   < > 1.18* 1.19* 1.00  CALCIUM  --   < > 9.5 9.0 8.3*  MG 1.8  --   --   --   --   < > = values in this interval not displayed. Liver Function Tests:  Recent Labs  05/02/16 0025 09/19/16 1215  AST 22 23  ALT 14 12  ALKPHOS 75 84  BILITOT 0.6 0.5  PROT 6.8 7.3  ALBUMIN 3.4* 3.9   No results for input(s): LIPASE, AMYLASE in the last 8760 hours. No results for input(s): AMMONIA in the last 8760 hours. CBC:  Recent Labs  05/02/16 0025  05/03/16 0130 09/19/16 1215 11/27/16 0240  WBC 5.8  < > 5.9 5.5 6.0  NEUTROABS 3.4  --   --  3,520 3.6  HGB 13.1  < > 12.9 11.7 9.9*  HCT 39.2  < > 37.9 35.2 30.8*  MCV 85.0  < > 85.2 90.3 91.1  PLT 219  < > 240 237 201  < > = values in this interval not displayed. Lipid Panel:  Recent Labs  05/02/16 0732  CHOL 195  HDL 65  LDLCALC 120*  TRIG 49  CHOLHDL 3.0   TSH: No results for input(s): TSH in the last 8760 hours. A1C: Lab Results  Component Value Date   HGBA1C 6.2 (H) 05/02/2016   MMSE - Mini Mental State Exam 02/08/2016  Not completed: (No Data)  Orientation to time 3  Orientation to Place 2  Registration 3  Attention/ Calculation 4  Recall 1  Language- name 2 objects 2  Language- repeat 0  Language- follow 3 step command 3  Language- read & follow direction 1  Write a sentence 1  Copy design 0  Total score 20    Assessment/Plan 1. Anxiety and depression -worse after abruptly stopping Effexor, daughter at visit reports her sister questioned if it was providing much benefit but after being off medication she has  increase anxiety with depression. Will try a zoloft at this time.  Discussed nonpharmacologic methods for anxiety and depression. - sertraline (ZOLOFT) 50 MG tablet; 1/2 tablet daily for 1 month then to increase to 1 tablet daily  Dispense: 30 tablet; Refill: 1  2. Vascular dementia without behavioral  disturbance Tolerating namezaric; will cont current regimen.   Total time 80mins:  time greater than 50% of total time spent doing pt counseled and coordination of care regarding anxiety and depression  Follow up in 4 weeks.   Carlos American. Harle Battiest  [facility] [phone] 8 am - 5 pm) [phone] (after hours)

## 2017-04-16 ENCOUNTER — Telehealth: Payer: Self-pay | Admitting: *Deleted

## 2017-04-16 NOTE — Telephone Encounter (Signed)
Patient daughter, [name] called and stated that she would like to speak with Dr. Mariea Clonts directly. Patient had an appointment yesterday and saw Janett Billow and was placed on a new AntiDepressant. Daughter stated she doesn't mean to come across mean or anything but just would like to speak with Dr. Mariea Clonts regarding patient. Stated that she is getting worse. Patient seems to be worse in the mornings with Crying and upset. This morning she called 911 and nothing is wrong with her. Stated that this is going on everyday and they just don't know how to handle her. Daughter stated that she really would like to speak with Dr. Mariea Clonts and would like her to call # [phone] (work) or if after 5pm [phone] (cell).

## 2017-04-16 NOTE — Telephone Encounter (Signed)
It sounds like Brittney Gutierrez's recommendation is appropriate for an antidepressant to help her with her crying and upset behavior.  Unfortunately, this is part of her dementia.  Sometimes patients go through different phases with behavioral issues.  The Alzheimer's association website has some helpful information along with the coach's guide spiral bound book and pamphlet that I believe we gave her daughters at one of the previous appts.

## 2017-04-17 ENCOUNTER — Ambulatory Visit: Payer: Medicare Other | Admitting: Audiology

## 2017-04-17 DIAGNOSIS — M25552 Pain in left hip: Secondary | ICD-10-CM | POA: Diagnosis not present

## 2017-04-17 DIAGNOSIS — M5136 Other intervertebral disc degeneration, lumbar region: Secondary | ICD-10-CM | POA: Diagnosis not present

## 2017-04-17 NOTE — Telephone Encounter (Signed)
FYI Spoke with patients daughter [name] she stated that she feels like her mother is going through a depression stage, she also mentioned that patient had a "hard stop with previous antidepression medication" patient complains of not having any energy. Daughter agreed to continue with new medication that Janett Billow started her on.

## 2017-04-23 DIAGNOSIS — H53413 Scotoma involving central area, bilateral: Secondary | ICD-10-CM | POA: Diagnosis not present

## 2017-04-23 DIAGNOSIS — H353213 Exudative age-related macular degeneration, right eye, with inactive scar: Secondary | ICD-10-CM | POA: Diagnosis not present

## 2017-04-25 ENCOUNTER — Ambulatory Visit: Payer: Medicare Other

## 2017-04-25 DIAGNOSIS — M5136 Other intervertebral disc degeneration, lumbar region: Secondary | ICD-10-CM | POA: Diagnosis not present

## 2017-04-30 ENCOUNTER — Telehealth: Payer: Self-pay | Admitting: *Deleted

## 2017-04-30 DIAGNOSIS — H353213 Exudative age-related macular degeneration, right eye, with inactive scar: Secondary | ICD-10-CM | POA: Diagnosis not present

## 2017-04-30 DIAGNOSIS — H53413 Scotoma involving central area, bilateral: Secondary | ICD-10-CM | POA: Diagnosis not present

## 2017-04-30 NOTE — Telephone Encounter (Signed)
Patient daughter, Alveda Reasons called and stated that patient has been having diarrhea for a couple of days now. Stated that they cut back on her stool softners but she still has it and wonders what they can give her for it. Please Advise.

## 2017-04-30 NOTE — Telephone Encounter (Signed)
[name] notified and agreed. Also stated that patient was placed on a antidepressant 1/2 tablet for 1 month was to increase to 1 tablet on 05/16/17 but they want to know if they can go ahead and increase it to a whole tablet now because patient is feeling "down". Please Advise.

## 2017-04-30 NOTE — Telephone Encounter (Signed)
[name], daughter notified and agreed.

## 2017-04-30 NOTE — Telephone Encounter (Signed)
Stop colace completely.  Take fiber- can do benefiber 1-2 times daily to bulk stools

## 2017-04-30 NOTE — Telephone Encounter (Signed)
Lets wait another week then can increase

## 2017-05-01 ENCOUNTER — Encounter: Payer: Self-pay | Admitting: Nurse Practitioner

## 2017-05-01 ENCOUNTER — Telehealth: Payer: Self-pay

## 2017-05-01 ENCOUNTER — Encounter: Payer: Self-pay | Admitting: Cardiovascular Disease

## 2017-05-01 ENCOUNTER — Ambulatory Visit (INDEPENDENT_AMBULATORY_CARE_PROVIDER_SITE_OTHER): Payer: Medicare Other | Admitting: Cardiovascular Disease

## 2017-05-01 ENCOUNTER — Ambulatory Visit (INDEPENDENT_AMBULATORY_CARE_PROVIDER_SITE_OTHER): Payer: Medicare Other | Admitting: Nurse Practitioner

## 2017-05-01 VITALS — BP 130/62 | HR 68 | Ht 62.0 in | Wt 132.0 lb

## 2017-05-01 VITALS — BP 122/60 | HR 70 | Temp 98.1°F | Resp 18 | Ht 62.0 in | Wt 132.6 lb

## 2017-05-01 DIAGNOSIS — K219 Gastro-esophageal reflux disease without esophagitis: Secondary | ICD-10-CM | POA: Diagnosis not present

## 2017-05-01 DIAGNOSIS — I10 Essential (primary) hypertension: Secondary | ICD-10-CM

## 2017-05-01 DIAGNOSIS — Z79899 Other long term (current) drug therapy: Secondary | ICD-10-CM | POA: Diagnosis not present

## 2017-05-01 DIAGNOSIS — I495 Sick sinus syndrome: Secondary | ICD-10-CM

## 2017-05-01 DIAGNOSIS — R197 Diarrhea, unspecified: Secondary | ICD-10-CM

## 2017-05-01 DIAGNOSIS — R102 Pelvic and perineal pain: Secondary | ICD-10-CM

## 2017-05-01 DIAGNOSIS — F419 Anxiety disorder, unspecified: Secondary | ICD-10-CM

## 2017-05-01 DIAGNOSIS — F32A Depression, unspecified: Secondary | ICD-10-CM

## 2017-05-01 DIAGNOSIS — I4891 Unspecified atrial fibrillation: Secondary | ICD-10-CM

## 2017-05-01 DIAGNOSIS — F329 Major depressive disorder, single episode, unspecified: Secondary | ICD-10-CM | POA: Diagnosis not present

## 2017-05-01 LAB — CBC WITH DIFFERENTIAL/PLATELET
Basophils Absolute: 0 cells/uL (ref 0–200)
Basophils Relative: 0 %
Eosinophils Absolute: 76 cells/uL (ref 15–500)
Eosinophils Relative: 1 %
HEMATOCRIT: 34.6 % — AB (ref 35.0–45.0)
Hemoglobin: 11.4 g/dL — ABNORMAL LOW (ref 11.7–15.5)
LYMPHS PCT: 13 %
Lymphs Abs: 988 cells/uL (ref 850–3900)
MCH: 29.5 pg (ref 27.0–33.0)
MCHC: 32.9 g/dL (ref 32.0–36.0)
MCV: 89.6 fL (ref 80.0–100.0)
MONO ABS: 608 {cells}/uL (ref 200–950)
MPV: 10.8 fL (ref 7.5–12.5)
Monocytes Relative: 8 %
NEUTROS ABS: 5928 {cells}/uL (ref 1500–7800)
NEUTROS PCT: 78 %
Platelets: 240 10*3/uL (ref 140–400)
RBC: 3.86 MIL/uL (ref 3.80–5.10)
RDW: 14.2 % (ref 11.0–15.0)
WBC: 7.6 10*3/uL (ref 3.8–10.8)

## 2017-05-01 LAB — POCT URINALYSIS DIPSTICK
Bilirubin, UA: NEGATIVE
Blood, UA: NEGATIVE
Glucose, UA: NEGATIVE
Ketones, UA: NEGATIVE
LEUKOCYTES UA: NEGATIVE
NITRITE UA: NEGATIVE
PH UA: 7.5 (ref 5.0–8.0)
Spec Grav, UA: 1.01 (ref 1.010–1.025)
UROBILINOGEN UA: 0.2 U/dL

## 2017-05-01 LAB — COMPLETE METABOLIC PANEL WITH GFR
ALK PHOS: 87 U/L (ref 33–130)
ALT: 11 U/L (ref 6–29)
AST: 17 U/L (ref 10–35)
Albumin: 3.6 g/dL (ref 3.6–5.1)
BUN: 11 mg/dL (ref 7–25)
CALCIUM: 9 mg/dL (ref 8.6–10.4)
CHLORIDE: 105 mmol/L (ref 98–110)
CO2: 21 mmol/L (ref 20–31)
Creat: 1.05 mg/dL — ABNORMAL HIGH (ref 0.60–0.88)
GFR, EST AFRICAN AMERICAN: 55 mL/min — AB (ref >=60)
GFR, Est Non African American: 48 mL/min — ABNORMAL LOW (ref >=60)
Glucose, Bld: 97 mg/dL (ref 65–99)
Potassium: 3.3 mmol/L — ABNORMAL LOW (ref 3.5–5.3)
Sodium: 140 mmol/L (ref 135–146)
Total Bilirubin: 0.6 mg/dL (ref 0.2–1.2)
Total Protein: 6.8 g/dL (ref 6.1–8.1)

## 2017-05-01 MED ORDER — APIXABAN 2.5 MG PO TABS: 2.5000 mg | ORAL_TABLET | Freq: Two times a day (BID) | ORAL | 3 refills | Status: DC

## 2017-05-01 MED ORDER — LOSARTAN POTASSIUM 100 MG PO TABS: 100.0000 mg | ORAL_TABLET | Freq: Every day | ORAL | 3 refills | Status: DC

## 2017-05-01 MED ORDER — METOPROLOL TARTRATE 25 MG PO TABS: 25.0000 mg | ORAL_TABLET | Freq: Two times a day (BID) | ORAL | 1 refills | Status: DC

## 2017-05-01 MED ORDER — AMLODIPINE BESYLATE 5 MG PO TABS: 5.0000 mg | ORAL_TABLET | Freq: Every day | ORAL | 3 refills | Status: DC

## 2017-05-01 MED ORDER — PANTOPRAZOLE SODIUM 40 MG PO TBEC
40.0000 mg | DELAYED_RELEASE_TABLET | Freq: Every day | ORAL | 3 refills | Status: DC
Start: 2017-05-01 — End: 2017-05-29

## 2017-05-01 MED ORDER — METOPROLOL TARTRATE 25 MG PO TABS: 25.0000 mg | ORAL_TABLET | Freq: Two times a day (BID) | ORAL | 3 refills | Status: DC

## 2017-05-01 MED ORDER — VENLAFAXINE HCL 37.5 MG PO TABS: 37.5000 mg | ORAL_TABLET | Freq: Two times a day (BID) | ORAL | 1 refills | Status: DC

## 2017-05-01 NOTE — Telephone Encounter (Signed)
Patient was seen today per Sherrie Mustache, NP request

## 2017-05-01 NOTE — Telephone Encounter (Signed)
Patient's daughter called, patient c/o side effects from Zoloft 50 mg, 1/2 tablet daily. Patient with loose stool, diarrhea, stomach cramping, faint feeling, and bloating x 3 days  Patient on Zoloft x 2 weeks  Please advise

## 2017-05-01 NOTE — Patient Instructions (Addendum)
To start Effexor 37.5 mg by mouth daily for 1 week then increase to twice daily.    Keep follow up appt

## 2017-05-01 NOTE — Patient Instructions (Signed)
Dr Sallyanne Kuster has recommended making the following medication changes: 1. STOP Atorvastatin  Your physician recommends that you schedule a follow-up appointment in 12 months. You will receive a reminder letter in the mail two months in advance. If you don't receive a letter, please call our office to schedule the follow-up appointment.  If you need a refill on your cardiac medications before your next appointment, please call your pharmacy.

## 2017-05-01 NOTE — Progress Notes (Signed)
Patient ID: Brittney Gutierrez, female   DOB: [birth year], 83 y.o.   MRN: [id number]    Cardiology Office Note    Date:  05/03/2017   ID:  Brittney Gutierrez, DOB [birth year], MRN [id number]  PCP:  Lauree Chandler, NP  Cardiologist:   Sanda Klein, MD   Chief Complaint  Patient presents with  . Follow-up    6 months    History of Present Illness:  Brittney Gutierrez is a 83 y.o. female with systemic hypertension and problems with both sinus bradycardia and paroxysmal atrial fibrillation with rapid ventricular response. She has requested conservative management, without pacemaker implantation  She's feeling well. She has not had clinically obvious episodes of atrial fibrillation. She is on anticoagulation with umbilicus thankfully has not had any more falls. She wants to minimize the number of medications that she takes. She was recently switched from Effexor or to Zoloft and developed diarrhea. She tells me she is now being switched back.  The patient specifically denies any chest pain at rest exertion, dyspnea at rest or with exertion, orthopnea, paroxysmal nocturnal dyspnea, syncope, palpitations, focal neurological deficits, intermittent claudication, lower extremity edema, unexplained weight gain, cough, hemoptysis or wheezing.  She has echo evidence of hypertensive heart disease with left ventricular hypertrophy but has not had overt congestive heart failure. She had minor coronary atherosclerosis at cath. In May 2017 her LDL was 120 She has a questionable hx of TIA, she has a CHA2DS2Vasc score of at least 4 (if + TIA would be 6). Myoview in Sept 2016 showed no ischemia. Echo shoed preserved LVF. Amiodarone was used briefly but was discontinued due to itching. Simultaneous treatment with metoprolol and diltiazem was associated with significant bradycardia. She is currently on metoprolol monotherapy.  Past Medical History:  Diagnosis Date  . Abnormal weight loss 05/28/2004  . Anxiety  01/17/2003  . Arthritis    "a little; qwhere" (5/25/20170  . Back pain 08/19/2005   with radiculopathy  . Bradycardia, drug induced   . Cerebral atherosclerosis 09/24/1999  . Chest pain, atypical    cath 05/03/2016 for recurrent CP, mild nonobstructive dx. Mild to moderate R renal artery stenosis, patent L renal artery  . Dementia    "don't know kind or stage" (05/02/2016)  . Depression 09/06/2002  . Diverticulitis 09/28/2007  . Diverticulosis of colon 06/07/2003  . External hemorrhoids 04/12/2009  . Female climacteric state 03/04/2000  . Formed visual hallucinations    [name]?  Failed Keppra and Depakote  . GERD (gastroesophageal reflux disease)   . Hallucination, visual   . Hypertension   . Hypertension    cath 05/03/2016 Mild to moderate R renal artery stenosis, patent L renal artery  . Leg pain, left   . Macular degeneration   . Malaise and fatigue   . Neurocysticercosis    Craniotomy at [facility] in early 2000s  . Osteoarthrosis, localized   . Osteoporosis 05/28/2004  . Palpitations 11/16/2001  . Paranoia ([place])   . Paroxysmal A-fib ([place])   . PSVT (paroxysmal supraventricular tachycardia) ([place]) 02/22/2002  . Pure hypercholesterolemia 12/20/1999  . TIA (transient ischemic attack) 2000s?  [name] Urinary incontinence 08/18/2007  . UTI (urinary tract infection)   . Vaginitis, atrophic   . Vertigo, peripheral 10/07/2000    Past Surgical History:  Procedure Laterality Date  . BRAIN SURGERY  2000   Neurocysticercosis ("parasite")  . CARDIAC CATHETERIZATION N/A 05/03/2016   Procedure: Left Heart Cath and Coronary Angiography;  Surgeon: Sherren Mocha, MD;  Location: [facility];  Service: Cardiovascular;  Laterality: N/A;  . CATARACT EXTRACTION  2013  . CATARACT EXTRACTION, BILATERAL     "not sure if both eyes; feel like it probably was"  . JOINT REPLACEMENT    . PERIPHERAL VASCULAR CATHETERIZATION N/A 05/03/2016   Procedure: Abdominal Aortogram;  Surgeon: Sherren Mocha, MD;   Location: [facility];  Service: Cardiovascular;  Laterality: N/A;  . SHOULDER ARTHROSCOPY W/ ROTATOR CUFF REPAIR Right X 3  . TOTAL KNEE ARTHROPLASTY Left 1990  . VAGINAL HYSTERECTOMY  1960    Current Medications: Outpatient Medications Prior to Visit  Medication Sig Dispense Refill  . acetaminophen (TYLENOL) 325 MG tablet Take 650 mg by mouth every 6 (six) hours as needed for mild pain.    . Cholecalciferol (VITAMIN D3) 2000 units TABS Take 2,000 Units by mouth daily.     [name] docusate sodium (COLACE) 100 MG capsule Take 100 mg by mouth 2 (two) times daily.     . Melatonin 3 MG TABS Take 3 mg by mouth at bedtime.     . Memantine HCl-Donepezil HCl (NAMZARIC) 28-10 MG CP24 Take 1 capsule by mouth daily. 90 capsule 3  . pantoprazole (PROTONIX) 40 MG tablet Take 1 tablet (40 mg total) by mouth daily. 30 tablet 3  . venlafaxine (EFFEXOR) 37.5 MG tablet Take 1 tablet (37.5 mg total) by mouth 2 (two) times daily. 60 tablet 1  . amLODipine (NORVASC) 5 MG tablet Take 1 tablet (5 mg total) by mouth daily. 90 tablet 3  . apixaban (ELIQUIS) 2.5 MG TABS tablet Take 1 tablet (2.5 mg total) by mouth 2 (two) times daily. 180 tablet 3  . atorvastatin (LIPITOR) 40 MG tablet Take 1 tablet (40 mg total) by mouth daily at 6 PM. 90 tablet 3  . losartan (COZAAR) 100 MG tablet Take 1 tablet (100 mg total) by mouth daily. 90 tablet 3  . metoprolol tartrate (LOPRESSOR) 25 MG tablet Take 1 tablet (25 mg total) by mouth 2 (two) times daily. 180 tablet 1  . tobramycin (TOBREX) 0.3 % ophthalmic solution Place 1 drop into the left eye See admin instructions. Use drops when receiving macular degeneration shot into left eye four times a day for several days     No facility-administered medications prior to visit.      Allergies:   Amiodarone; Amoxicillin; Demerol [meperidine]; Hydralazine; Lisinopril; Zoloft [sertraline hcl]; and Tape   Social History   Social History  . Marital status: Widowed    Spouse name:  N/A  . Number of children: N/A  . Years of education: N/A   Social History Main Topics  . Smoking status: Never Smoker  . Smokeless tobacco: Never Used  . Alcohol use No  . Drug use: No  . Sexual activity: No   Other Topics Concern  . None   Social History Narrative   DIET: None      DO YOU DRINK/EAT THINGS WITH CAFFEINE: yes      MARITAL STATUS: widow      WHAT YEAR WERE YOU MARRIED: 1948      DO YOU LIVE IN A HOUSE, APARTMENT, ASSISTED LIVING, CONDO TRAILER ETC.: Independent Living      IS IT ONE OR MORE STORIES: 1 story      HOW MANY PERSONS LIVE IN YOUR HOME: 1      DO YOU HAVE PETS IN YOUR HOME: no      CURRENT OR PAST PROFESSION: Book Therapist, nutritional  DO YOU EXERCISE: no      WHAT TYPE AND HOW OFTEN:     Family History:  The patient's family history includes Heart attack in her father; Hypertension in her sister; Polycystic kidney disease in her mother.   ROS:   Please see the history of present illness.    ROS All other systems reviewed and are negative.   PHYSICAL EXAM:   VS:  BP 130/62   Pulse 68   Ht 5\' 2"  (1.575 m)   Wt 132 lb (59.9 kg)   BMI 24.14 kg/m     General: Alert, oriented x3, no distress Head: no evidence of trauma, PERRL, EOMI, no exophtalmos or lid lag, no myxedema, no xanthelasma; normal ears, nose and oropharynx Neck: normal jugular venous pulsations and no hepatojugular reflux; brisk carotid pulses without delay and no carotid bruits Chest: clear to auscultation, no signs of consolidation by percussion or palpation, normal fremitus, symmetrical and full respiratory excursions Cardiovascular: normal position and quality of the apical impulse, regular rhythm, normal first and second heart sounds, no murmurs, rubs or gallops Abdomen: no tenderness or distention, no masses by palpation, no abnormal pulsatility or arterial bruits, normal bowel sounds, no hepatosplenomegaly Extremities: no clubbing, cyanosis or edema; 2+ radial, ulnar and  brachial pulses bilaterally; 2+ right femoral, posterior tibial and dorsalis pedis pulses; 2+ left femoral, posterior tibial and dorsalis pedis pulses; no subclavian or femoral bruits Neurological: grossly nonfocal Psych: euthymic mood, full affect  Wt Readings from Last 3 Encounters:  05/01/17 132 lb (59.9 kg)  05/01/17 132 lb 9.6 oz (60.1 kg)  04/15/17 133 lb 9.6 oz (60.6 kg)      Studies/Labs Reviewed:   EKG:  EKG is ordered today.  It shows normal sinus rhythm with PVCs, LVH with QRS broadening 118 ms, left axis deviation, QTC 493 ms  Recent Labs: 11/27/2016: B Natriuretic Peptide 304.2 05/01/2017: ALT 11 05/03/2017: BUN 11; Creatinine, Ser 1.22; Hemoglobin 10.8; Platelets 238; Potassium 3.0; Sodium 137   Lipid Panel    Component Value Date/Time   CHOL 195 05/02/2016 0732   TRIG 49 05/02/2016 0732   HDL 65 05/02/2016 0732   CHOLHDL 3.0 05/02/2016 0732   VLDL 10 05/02/2016 0732   LDLCALC 120 (H) 05/02/2016 0732     ASSESSMENT:    1. Atrial fibrillation with rapid ventricular response ([place])   2. Essential hypertension   3. SSS (sick sinus syndrome) ([place])   4. Polypharmacy      PLAN:  In order of problems listed above:  1. AFib: No clinical recurrence. Not on antiarrhythmics or doses of rate control meds due to problems with bradycardia. Trental avoid pacemaker implantation. I embolic risk. Continue anticoagulation as long as safe. 2. HTN: Good control on 3 different antihypertensive medications. I think we can really stop any of them. 3. SSS: She would like to avoid pacemaker implantation unless absolutely necessary. 4. Polypharmacy: She requests a simplification of her medication. Is not unreasonable to stop her statin since she has not had significant vascular problems up to the age of 41. Also consider discontinuing Protonix and Namzaric, but should discuss this with her primary care physician or the appropriate specialists.   Medication Adjustments/Labs and Tests  Ordered: Current medicines are reviewed at length with the patient today.  Concerns regarding medicines are outlined above.  Medication changes, Labs and Tests ordered today are listed in the Patient Instructions below. Patient Instructions  Dr Sallyanne Kuster has recommended making the following medication changes: 1. STOP  Atorvastatin  Your physician recommends that you schedule a follow-up appointment in 12 months. You will receive a reminder letter in the mail two months in advance. If you don't receive a letter, please call our office to schedule the follow-up appointment.  If you need a refill on your cardiac medications before your next appointment, please call your pharmacy.    Signed, Sanda Klein, MD  05/03/2017 10:17 PM    [facility] [address], [place], [place]  [address] Phone: [phone]; Fax: [phone]

## 2017-05-01 NOTE — Progress Notes (Signed)
Careteam: Patient Care Team: Lauree Chandler, NP as PCP - General (Geriatric Medicine) Sanda Klein, MD as Consulting Physician (Cardiology) Sherlynn Stalls, MD as Consulting Physician (Ophthalmology) Murriel Hopper, MD as Referring Physician (Student)  Advanced Directive information Does Patient Have a Medical Advance Directive?: Yes, Type of Advance Directive: Healthcare Power of Attorney  Allergies  Allergen Reactions  . Amiodarone Itching  . Amoxicillin Other (See Comments)    Unknown- ? Upset stomach  . Demerol [Meperidine] Other (See Comments)    Hallucinations  . Hydralazine Other (See Comments)    Tingling and chest pain   . Lisinopril Cough  . Tape Rash    Can tear the skin, also    Chief Complaint  Patient presents with  . Acute Visit    Diarrhea, multiple times a day, stomach discomfort in abdomen, nausea, tiredness, feeling faint     HPI: Patient is a 83 y.o. female seen in the office today due to diarrhea. Pt with hx of anxiety, depression, dementia, GERD, DDD and others. Pts daughter had abrubtly stopped her effexor and then restarted it after anxiety and depression worsened. Felt like Effexor was not benefiting but once it was stopped realized there was some benefit. Zoloft was started on 5/8 and started having GI issues soon after (nausea, abdominal discomfort). 3 days ago reports of diarrhea started which have not stopped. Multiple bowel movements in a day. No vomiting. Appetite is still good but having some nausea. Now with sour stomach, and worsening of  GERD.   Daughter reports she sister thinks that she may not tolerated Zoloft in the past.   Was taking Effexor 37.5 by mouth twice daily previously.  Review of Systems:  Review of Systems  Constitutional: Negative for chills, fever and malaise/fatigue.  HENT: Negative for congestion.   Eyes: Negative for blurred vision.  Respiratory: Negative for shortness of breath.   Cardiovascular: Negative  for chest pain and palpitations.  Gastrointestinal: Positive for abdominal pain, diarrhea, heartburn and nausea. Negative for blood in stool, constipation, melena and vomiting.  Genitourinary: Negative for dysuria.  Musculoskeletal: Positive for back pain and joint pain. Negative for falls.       Left hip/lower back  Neurological: Positive for dizziness (occasionally but reports this is not new) and weakness. Negative for loss of consciousness.  Endo/Heme/Allergies: Does not bruise/bleed easily.  Psychiatric/Behavioral: Positive for memory loss.    Past Medical History:  Diagnosis Date  . Abnormal weight loss 05/28/2004  . Anxiety 01/17/2003  . Arthritis    "a little; qwhere" (5/25/20170  . Back pain 08/19/2005   with radiculopathy  . Bradycardia, drug induced   . Cerebral atherosclerosis 09/24/1999  . Chest pain, atypical    cath 05/03/2016 for recurrent CP, mild nonobstructive dx. Mild to moderate R renal artery stenosis, patent L renal artery  . Dementia    "don't know kind or stage" (05/02/2016)  . Depression 09/06/2002  . Diverticulitis 09/28/2007  . Diverticulosis of colon 06/07/2003  . External hemorrhoids 04/12/2009  . Female climacteric state 03/04/2000  . Formed visual hallucinations    [name]?  Failed Keppra and Depakote  . GERD (gastroesophageal reflux disease)   . Hallucination, visual   . Hypertension   . Hypertension    cath 05/03/2016 Mild to moderate R renal artery stenosis, patent L renal artery  . Leg pain, left   . Macular degeneration   . Malaise and fatigue   . Neurocysticercosis    C[facility]iotomy at [facility]  in early 2000s  . Osteoarthrosis, localized   . Osteoporosis 05/28/2004  . Palpitations 11/16/2001  . Paranoia ([place])   . Paroxysmal A-fib ([place])   . PSVT (paroxysmal supraventricular tachycardia) ([place]) 02/22/2002  . Pure hypercholesterolemia 12/20/1999  . TIA (transient ischemic attack) 2000s?  [name] Urinary incontinence 08/18/2007  . UTI (urinary tract  infection)   . Vaginitis, atrophic   . Vertigo, peripheral 10/07/2000   Past Surgical History:  Procedure Laterality Date  . BRAIN SURGERY  2000   Neurocysticercosis ("parasite")  . CARDIAC CATHETERIZATION N/A 05/03/2016   Procedure: Left Heart Cath and Coronary Angiography;  Surgeon: Sherren Mocha, MD;  Location: [facility];  Service: Cardiovascular;  Laterality: N/A;  . CATARACT EXTRACTION  2013  . CATARACT EXTRACTION, BILATERAL     "not sure if both eyes; feel like it probably was"  . JOINT REPLACEMENT    . PERIPHERAL VASCULAR CATHETERIZATION N/A 05/03/2016   Procedure: Abdominal Aortogram;  Surgeon: Sherren Mocha, MD;  Location: [facility];  Service: Cardiovascular;  Laterality: N/A;  . SHOULDER ARTHROSCOPY W/ ROTATOR CUFF REPAIR Right X 3  . TOTAL KNEE ARTHROPLASTY Left 1990  . VAGINAL HYSTERECTOMY  1960   Social History:   reports that she has never smoked. She has never used smokeless tobacco. She reports that she does not drink alcohol or use drugs.  Family History  Problem Relation Age of [place]  . Polycystic kidney disease Mother   . Heart attack Father   . Hypertension Sister   . Dementia Neg Hx     Medications: Patient's Medications  New Prescriptions   No medications on file  Previous Medications   ACETAMINOPHEN (TYLENOL) 325 MG TABLET    Take 650 mg by mouth every 6 (six) hours as needed for mild pain.   AMLODIPINE (NORVASC) 5 MG TABLET    Take 1 tablet (5 mg total) by mouth daily.   APIXABAN (ELIQUIS) 2.5 MG TABS TABLET    Take 1 tablet (2.5 mg total) by mouth 2 (two) times daily.   ATORVASTATIN (LIPITOR) 40 MG TABLET    Take 1 tablet (40 mg total) by mouth daily at 6 PM.   CHOLECALCIFEROL (VITAMIN D3) 2000 UNITS TABS    Take 1 tablet by mouth daily.   DOCUSATE SODIUM (COLACE) 100 MG CAPSULE    Take 100 mg by mouth 2 (two) times daily.    LOSARTAN (COZAAR) 100 MG TABLET    Take 1 tablet (100 mg total) by mouth daily.   MELATONIN 3 MG TABS     Take 1 tablet by mouth at bedtime.   MEMANTINE HCL-DONEPEZIL HCL (NAMZARIC) 28-10 MG CP24    Take 1 capsule by mouth daily.   METOPROLOL TARTRATE (LOPRESSOR) 25 MG TABLET    Take 1 tablet (25 mg total) by mouth 2 (two) times daily.   OMEPRAZOLE (PRILOSEC) 20 MG CAPSULE    Take 20 mg by mouth daily.   SERTRALINE (ZOLOFT) 50 MG TABLET    1/2 tablet daily for 1 month then to increase to 1 tablet daily   TOBRAMYCIN (TOBREX) 0.3 % OPHTHALMIC SOLUTION    Place 1 drop into the left eye See admin instructions. Use drops when receiving macular degeneration shot into left eye four times a day for several days  Modified Medications   No medications on file  Discontinued Medications   PANTOPRAZOLE (PROTONIX) 40 MG TABLET    Take 1 tablet (40 mg total) by mouth daily.  Physical Exam:  Vitals:   05/01/17 1022  BP: 122/60  Pulse: 70  Resp: 18  Temp: 98.1 F (36.7 C)  SpO2: 97%  Weight: 132 lb 9.6 oz (60.1 kg)  Height: _0  (1.575 m)   Body mass index is 24.25 kg/m.  Physical Exam  Constitutional: She appears well-developed and well-nourished. No distress.  HENT:  Head: Normocephalic and atraumatic.  Nose: Nose normal.  Mouth/Throat: Oropharynx is clear and moist. No oropharyngeal exudate.  Eyes: EOM are normal. Pupils are equal, round, and reactive to light.  glasses  Neck: Normal range of motion. Neck supple.  Cardiovascular: Normal rate.  An irregularly irregular rhythm present.  Pulmonary/Chest: Effort normal and breath sounds normal. No respiratory distress.  Abdominal: Soft. Bowel sounds are normal. She exhibits no distension. There is tenderness (suprapubic).  Neurological: She is alert.  Skin: Skin is warm and dry.  Psychiatric: She has a normal mood and affect.     Labs reviewed: Basic Metabolic Panel:  Recent Labs  05/02/16 0732  09/19/16 1215 10/08/16 1408 11/27/16 0240  NA  --   < > 133* 138 137  K  --   < > 4.5 4.6 3.4*  CL  --   < > 95* 102 107  CO2  --   <  > 22 26 21*  GLUCOSE  --   < > 73 79 110*  BUN  --   < > _1 CREATININE  --   < > 1.18* 1.19* 1.00  CALCIUM  --   < > 9.5 9.0 8.3*  MG 1.8  --   --   --   --   < > = values in this interval not displayed. Liver Function Tests:  Recent Labs  05/02/16 0025 09/19/16 1215  AST 22 23  ALT 14 12  ALKPHOS 75 84  BILITOT 0.6 0.5  PROT 6.8 7.3  ALBUMIN 3.4* 3.9   No results for input(s): LIPASE, AMYLASE in the last 8760 hours. No results for input(s): AMMONIA in the last 8760 hours. CBC:  Recent Labs  05/02/16 0025  05/03/16 0130 09/19/16 1215 11/27/16 0240  WBC 5.8  < > 5.9 5.5 6.0  NEUTROABS 3.4  --   --  3,520 3.6  HGB 13.1  < > 12.9 11.7 9.9*  HCT 39.2  < > 37.9 35.2 30.8*  MCV 85.0  < > 85.2 90.3 91.1  PLT 219  < > 240 237 201  < > = values in this interval not displayed. Lipid Panel:  Recent Labs  05/02/16 0732  CHOL 195  HDL 65  LDLCALC 120*  TRIG 49  CHOLHDL 3.0   TSH: No results for input(s): TSH in the last 8760 hours. A1C: Lab Results  Component Value Date   HGBA1C 6.2 (H) 05/02/2016     Assessment/Plan 1. Anxiety and depression To stop zoloft due to intolerance. Will restart effexor. To take 1 tablet daily for 1 week then BID - venlafaxine (EFFEXOR) 37.5 MG tablet; Take 1 tablet (37.5 mg total) by mouth 2 (two) times daily.  Dispense: 60 tablet; Refill: 1  2. Diarrhea, unspecified type -?related to zoloft, will get lab work as well as stopping zoloft - CBC with Differential/Platelets - CMP with eGFR - Amylase - Lipase - encouraged proper hydration, to increase fluids with diarrhea such as Gatorade.   3. Gastroesophageal reflux disease without esophagitis -worse, was on protonix in the past with better results. Will stop omeprazole and restart  protonix - pantoprazole (PROTONIX) 40 MG tablet; Take 1 tablet (40 mg total) by mouth daily.  Dispense: 30 tablet; Refill: 3  4. Suprapubic pain On exam; POCT urinalysis dipstick obtain which  was negative. Will get labs.  Return precautions discussed, pt and daughter understand. To keep follow up in 2 weeks.  Carlos American. Harle Battiest  [facility] [phone] 8 am - 5 pm) [phone] (after hours)

## 2017-05-02 LAB — AMYLASE: AMYLASE: 46 U/L (ref 21–101)

## 2017-05-02 LAB — LIPASE: LIPASE: 48 U/L (ref 7–60)

## 2017-05-03 ENCOUNTER — Emergency Department (HOSPITAL_COMMUNITY)
Admission: EM | Admit: 2017-05-03 | Discharge: 2017-05-03 | Disposition: A | Discharge status: Home / Self Care | Payer: Medicare Other | Attending: Emergency Medicine | Admitting: Emergency Medicine

## 2017-05-03 ENCOUNTER — Encounter (HOSPITAL_COMMUNITY): Payer: Self-pay | Admitting: Emergency Medicine

## 2017-05-03 DIAGNOSIS — I10 Essential (primary) hypertension: Secondary | ICD-10-CM | POA: Diagnosis not present

## 2017-05-03 DIAGNOSIS — Z7901 Long term (current) use of anticoagulants: Secondary | ICD-10-CM | POA: Insufficient documentation

## 2017-05-03 DIAGNOSIS — R42 Dizziness and giddiness: Secondary | ICD-10-CM | POA: Diagnosis not present

## 2017-05-03 DIAGNOSIS — Z8673 Personal history of transient ischemic attack (TIA), and cerebral infarction without residual deficits: Secondary | ICD-10-CM | POA: Diagnosis not present

## 2017-05-03 DIAGNOSIS — N390 Urinary tract infection, site not specified: Secondary | ICD-10-CM | POA: Diagnosis not present

## 2017-05-03 DIAGNOSIS — Z79899 Other long term (current) drug therapy: Secondary | ICD-10-CM | POA: Insufficient documentation

## 2017-05-03 DIAGNOSIS — R1030 Lower abdominal pain, unspecified: Secondary | ICD-10-CM

## 2017-05-03 DIAGNOSIS — R404 Transient alteration of awareness: Secondary | ICD-10-CM | POA: Diagnosis not present

## 2017-05-03 LAB — CBC WITH DIFFERENTIAL/PLATELET
Basophils Absolute: 0 10*3/uL (ref 0.0–0.1)
Basophils Relative: 1 %
Eosinophils Absolute: 0.2 10*3/uL (ref 0.0–0.7)
Eosinophils Relative: 3 %
HCT: 33.7 % — ABNORMAL LOW (ref 36.0–46.0)
Hemoglobin: 10.8 g/dL — ABNORMAL LOW (ref 12.0–15.0)
Lymphocytes Relative: 20 %
Lymphs Abs: 1 10*3/uL (ref 0.7–4.0)
MCH: 29.3 pg (ref 26.0–34.0)
MCHC: 32 g/dL (ref 30.0–36.0)
MCV: 91.6 fL (ref 78.0–100.0)
Monocytes Absolute: 0.4 10*3/uL (ref 0.1–1.0)
Monocytes Relative: 7 %
Neutro Abs: 3.7 10*3/uL (ref 1.7–7.7)
Neutrophils Relative %: 69 %
Platelets: 238 10*3/uL (ref 150–400)
RBC: 3.68 MIL/uL — ABNORMAL LOW (ref 3.87–5.11)
RDW: 13.4 % (ref 11.5–15.5)
WBC: 5.2 10*3/uL (ref 4.0–10.5)

## 2017-05-03 LAB — URINALYSIS, ROUTINE W REFLEX MICROSCOPIC
Bilirubin Urine: NEGATIVE
Glucose, UA: NEGATIVE mg/dL
Hgb urine dipstick: NEGATIVE
Ketones, ur: NEGATIVE mg/dL
Leukocytes, UA: NEGATIVE
Nitrite: POSITIVE — AB
Protein, ur: NEGATIVE mg/dL
RBC / HPF: NONE SEEN RBC/hpf (ref 0–5)
Specific Gravity, Urine: 1.004 — ABNORMAL LOW (ref 1.005–1.030)
WBC, UA: NONE SEEN WBC/hpf (ref 0–5)
pH: 7 (ref 5.0–8.0)

## 2017-05-03 LAB — BASIC METABOLIC PANEL
Anion gap: 8 (ref 5–15)
BUN: 11 mg/dL (ref 6–20)
CO2: 26 mmol/L (ref 22–32)
Calcium: 9.1 mg/dL (ref 8.9–10.3)
Chloride: 103 mmol/L (ref 101–111)
Creatinine, Ser: 1.22 mg/dL — ABNORMAL HIGH (ref 0.44–1.00)
GFR calc Af Amer: 45 mL/min — ABNORMAL LOW (ref >60)
GFR calc non Af Amer: 39 mL/min — ABNORMAL LOW (ref >60)
Glucose, Bld: 101 mg/dL — ABNORMAL HIGH (ref 65–99)
Potassium: 3 mmol/L — ABNORMAL LOW (ref 3.5–5.1)
Sodium: 137 mmol/L (ref 135–145)

## 2017-05-03 MED ORDER — MAGNESIUM OXIDE 400 (241.3 MG) MG PO TABS
800.0000 mg | ORAL_TABLET | Freq: Once | ORAL | Status: AC
  Administered 2017-05-03: 800 mg via ORAL
  Filled 2017-05-03: qty 2

## 2017-05-03 MED ORDER — POTASSIUM CHLORIDE CRYS ER 20 MEQ PO TBCR
60.0000 meq | EXTENDED_RELEASE_TABLET | Freq: Once | ORAL | Status: AC
  Administered 2017-05-03: 60 meq via ORAL
  Filled 2017-05-03: qty 3

## 2017-05-03 MED ORDER — LORAZEPAM 2 MG/ML IJ SOLN
0.7500 mg | Freq: Once | INTRAMUSCULAR | Status: AC
  Administered 2017-05-03: 0.75 mg via INTRAVENOUS
  Filled 2017-05-03: qty 1

## 2017-05-03 MED ORDER — SULFAMETHOXAZOLE-TRIMETHOPRIM 800-160 MG PO TABS
1.0000 | ORAL_TABLET | Freq: Once | ORAL | Status: AC
  Administered 2017-05-03: 1 via ORAL
  Filled 2017-05-03: qty 1

## 2017-05-03 MED ORDER — SULFAMETHOXAZOLE-TRIMETHOPRIM 800-160 MG PO TABS: 1.0000 | ORAL_TABLET | Freq: Two times a day (BID) | ORAL | 0 refills | Status: DC

## 2017-05-03 MED ORDER — SODIUM CHLORIDE 0.9 % IV BOLUS (SEPSIS)
1000.0000 mL | Freq: Once | INTRAVENOUS | Status: AC
  Administered 2017-05-03: 1000 mL via INTRAVENOUS

## 2017-05-03 NOTE — ED Notes (Signed)
Attempted to obtain urine specimen; Pt unable to provide one at this time 

## 2017-05-03 NOTE — ED Triage Notes (Signed)
Patient arrived with EMS from [place] home reports dizziness and " tingling" all over onset 4 am this morning .

## 2017-05-03 NOTE — ED Provider Notes (Signed)
[facility] DEPT Provider Note   CSN: [id number] Arrival date & time: 05/03/17  0702     History   Chief Complaint Chief Complaint  Patient presents with  . Dizziness    " Tingling"    HPI Brittney Gutierrez is a 83 y.o. female.  HPI   89yF with several complaints. 2-3 days ago began having lower abdominal pain. "It just feels gripey." Comes and goes. Diarrhea x1. Formed BM since then. No urinary complaints. No n/v. Feels "tingly all over." When asked if feels from head to toe, she replied that it's actually primarily her LLE.   Past Medical History:  Diagnosis Date  . Abnormal weight loss 05/28/2004  . Anxiety 01/17/2003  . Arthritis    "a little; qwhere" (5/25/20170  . Back pain 08/19/2005   with radiculopathy  . Bradycardia, drug induced   . Cerebral atherosclerosis 09/24/1999  . Chest pain, atypical    cath 05/03/2016 for recurrent CP, mild nonobstructive dx. Mild to moderate R renal artery stenosis, patent L renal artery  . Dementia    "don't know kind or stage" (05/02/2016)  . Depression 09/06/2002  . Diverticulitis 09/28/2007  . Diverticulosis of colon 06/07/2003  . External hemorrhoids 04/12/2009  . Female climacteric state 03/04/2000  . Formed visual hallucinations    [name]?  Failed Keppra and Depakote  . GERD (gastroesophageal reflux disease)   . Hallucination, visual   . Hypertension   . Hypertension    cath 05/03/2016 Mild to moderate R renal artery stenosis, patent L renal artery  . Leg pain, left   . Macular degeneration   . Malaise and fatigue   . 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 Osteoarthrosis, localized   . Osteoporosis 05/28/2004  . Palpitations 11/16/2001  . Paranoia ([place])   . Paroxysmal A-fib ([place])   . PSVT (paroxysmal supraventricular tachycardia) ([place]) 02/22/2002  . Pure hypercholesterolemia 12/20/1999  . TIA (transient ischemic attack) 2000s?  [name] Urinary incontinence 08/18/2007  . UTI (urinary tract infection)     . Vaginitis, atrophic   . Vertigo, peripheral 10/07/2000    Patient Active Problem List   Diagnosis Date Noted  . DDD (degenerative disc disease), lumbar 03/24/2017  . Left hip pain 03/24/2017  . Current use of long term anticoagulation 06/16/2016  . Essential hypertension, malignant   . Chest pain 05/02/2016  . GERD (gastroesophageal reflux disease) 04/18/2016  . Depression 04/18/2016  . SSS (sick sinus syndrome) ([place]) 04/11/2016  . Atrial fibrillation with rapid ventricular response ([place])   . Hypertensive urgency 02/29/2016  . Sinus bradycardia seen on cardiac monitor 01/14/2016  . Aortic insufficiency 01/14/2016  . PAH (pulmonary artery hypertension) ([place]) 01/14/2016  . Essential hypertension 01/14/2016  . Atrial fibrillation with RVR ([place]) 12/27/2015  . UTI (lower urinary tract infection) 12/27/2015  . Dementia 12/27/2015  . Hypokalemia 12/27/2015  . Demand ischemia [facility])     Past Surgical History:  Procedure Laterality Date  . BRAIN SURGERY  2000   Neurocysticercosis ("parasite")  . CARDIAC CATHETERIZATION N/A 05/03/2016   Procedure: Left Heart Cath and Coronary Angiography;  Surgeon: Sherren Mocha, MD;  Location: [place] [facility];  Service: Cardiovascular;  Laterality: N/A;  . CATARACT EXTRACTION  2013  . CATARACT EXTRACTION, BILATERAL     "not sure if both eyes; feel like it probably was"  . JOINT REPLACEMENT    . PERIPHERAL VASCULAR CATHETERIZATION N/A 05/03/2016   Procedure: Abdominal Aortogram;  Surgeon: Sherren Mocha, MD;  Location: [place] [facility];  Service: Cardiovascular;  Laterality: N/A;  . SHOULDER ARTHROSCOPY W/ ROTATOR CUFF REPAIR Right X 3  . TOTAL KNEE ARTHROPLASTY Left 1990  . VAGINAL HYSTERECTOMY  1960    OB History    No data available       Home Medications    Prior to Admission medications   Medication Sig Start Date End Date Taking? Authorizing Provider  acetaminophen (TYLENOL) 325 MG tablet Take 650 mg by mouth every 6 (six)  hours as needed for mild pain.    [provider]  amLODipine (NORVASC) 5 MG tablet Take 1 tablet (5 mg total) by mouth daily. 05/01/17   Croitoru, Mihai, MD  apixaban (ELIQUIS) 2.5 MG TABS tablet Take 1 tablet (2.5 mg total) by mouth 2 (two) times daily. 05/01/17   Croitoru, Mihai, MD  Cholecalciferol (VITAMIN D3) 2000 units TABS Take 1 tablet by mouth daily.    [provider]  docusate sodium (COLACE) 100 MG capsule Take 100 mg by mouth 2 (two) times daily.     [provider]  losartan (COZAAR) 100 MG tablet Take 1 tablet (100 mg total) by mouth daily. 05/01/17   Croitoru, Mihai, MD  Melatonin 3 MG TABS Take 1 tablet by mouth at bedtime.    [provider]  Memantine HCl-Donepezil HCl (NAMZARIC) 28-10 MG CP24 Take 1 capsule by mouth daily. 03/24/17   Reed, Tiffany L, DO  metoprolol tartrate (LOPRESSOR) 25 MG tablet Take 1 tablet (25 mg total) by mouth 2 (two) times daily. 05/01/17   Croitoru, Mihai, MD  pantoprazole (PROTONIX) 40 MG tablet Take 1 tablet (40 mg total) by mouth daily. 05/01/17   Lauree Chandler, NP  tobramycin (TOBREX) 0.3 % ophthalmic solution Place 1 drop into the left eye See admin instructions. Use drops when receiving macular degeneration shot into left eye four times a day for several days 03/11/16   [provider]  venlafaxine (EFFEXOR) 37.5 MG tablet Take 1 tablet (37.5 mg total) by mouth 2 (two) times daily. 05/01/17   Lauree Chandler, NP    Family History Family History  Problem Relation Age of Onset  . Polycystic kidney disease Mother   . Heart attack Father   . Hypertension Sister   . Dementia Neg Hx     Social History Social History  Substance Use Topics  . Smoking status: Never Smoker  . Smokeless tobacco: Never Used  . Alcohol use No     Allergies   Amiodarone; Amoxicillin; Demerol [meperidine]; Hydralazine; Lisinopril; Zoloft [sertraline hcl]; and Tape   Review of Systems Review of Systems  All  systems reviewed and negative, other than as noted in HPI.   Physical Exam Updated Vital Signs BP (!) 165/61 (BP Location: Left Arm)   Pulse 64   Temp 98.1 F (36.7 C) (Oral)   Resp 16   SpO2 96%   Physical Exam  Constitutional: She appears well-developed and well-nourished. No distress.  HENT:  Head: Normocephalic and atraumatic.  Eyes: Conjunctivae are normal. Right eye exhibits no discharge. Left eye exhibits no discharge.  Neck: Neck supple.  Cardiovascular: Normal rate, regular rhythm and normal heart sounds.  Exam reveals no gallop and no friction rub.   No murmur heard. Pulmonary/Chest: Effort normal and breath sounds normal. No respiratory distress.  Abdominal: Soft. She exhibits no distension. There is tenderness.  Mild tenderness across lower abdomen. Doesn't lateralize.   Musculoskeletal: She exhibits no edema or tenderness.  Neurological: She  is alert.  Skin: Skin is warm and dry.  Psychiatric: She has a normal mood and affect. Her behavior is normal. Thought content normal.  Nursing note and vitals reviewed.    ED Treatments / Results  Labs (all labs ordered are listed, but only abnormal results are displayed) Labs Reviewed  URINE CULTURE - Abnormal; Notable for the following:       Result Value   Culture >=100,000 COLONIES/mL KLEBSIELLA PNEUMONIAE (*)    Organism ID, Bacteria KLEBSIELLA PNEUMONIAE (*)    All other components within normal limits  CBC WITH DIFFERENTIAL/PLATELET - Abnormal; Notable for the following:    RBC 3.68 (*)    Hemoglobin 10.8 (*)    HCT 33.7 (*)    All other components within normal limits  BASIC METABOLIC PANEL - Abnormal; Notable for the following:    Potassium 3.0 (*)    Glucose, Bld 101 (*)    Creatinine, Ser 1.22 (*)    GFR calc non Af Amer 39 (*)    GFR calc Af Amer 45 (*)    All other components within normal limits  URINALYSIS, ROUTINE W REFLEX MICROSCOPIC - Abnormal; Notable for the following:    Specific Gravity,  Urine 1.004 (*)    Nitrite POSITIVE (*)    Bacteria, UA FEW (*)    Squamous Epithelial / LPF 0-5 (*)    All other components within normal limits    EKG  EKG Interpretation  Date/Time:  Saturday May 03 2017 07:06:19 EDT Ventricular Rate:  62 PR Interval:    QRS Duration: 125 QT Interval:  466 QTC Calculation: 474 R Axis:   -37 Text Interpretation:  Sinus rhythm Ventricular premature complex LVH with IVCD, LAD and secondary repol abnrm Confirmed by Wilson Singer  MD, Vesper Trant ([id number]) on 05/03/2017 7:18:25 AM       Radiology No results found.  Procedures Procedures (including critical care time)  Medications Ordered in ED Medications - No data to display   Initial Impression / Assessment and Plan / ED Course  I have reviewed the triage vital signs and the nursing notes.  Pertinent labs & imaging results that were available during my care of the patient were reviewed by me and considered in my medical decision making (see chart for details).     89yF with vague abdominal pain and systemic symptoms. Could very well be from UTI. Afebrile. Nontoxic. I feel appropriate for outpt tx.   Final Clinical Impressions(s) / ED Diagnoses   Final diagnoses:  Lower abdominal pain  Urinary tract infection without hematuria, site unspecified    New Prescriptions New Prescriptions   No medications on file     Virgel Manifold, MD 05/11/17 [phone]

## 2017-05-05 LAB — URINE CULTURE: Culture: >=100000 — AB

## 2017-05-06 ENCOUNTER — Telehealth: Payer: Self-pay | Admitting: Emergency Medicine

## 2017-05-06 NOTE — Telephone Encounter (Signed)
Post ED Visit - Positive Culture Follow-up  Culture report reviewed by antimicrobial stewardship pharmacist:  []  Elenor Quinones, Pharm.D. [x]  Heide Guile, Pharm.D., BCPS AQ-ID []  Parks Neptune, Pharm.D., BCPS []  Alycia Rossetti, Pharm.D., BCPS []  [place], Pharm.D., BCPS, AAHIVP []  Legrand Como, Pharm.D., BCPS, AAHIVP []  Salome Arnt, PharmD, BCPS []  Dimitri Ped, PharmD, BCPS []  Vincenza Hews, PharmD, BCPS  Positive urine culture Treated with sulfamethoxazole-trimethoprim, organism sensitive to the same and no further patient follow-up is required at this time.  Hazle Nordmann 05/06/2017, 10:11 AM

## 2017-05-07 ENCOUNTER — Ambulatory Visit (INDEPENDENT_AMBULATORY_CARE_PROVIDER_SITE_OTHER): Payer: Medicare Other

## 2017-05-07 VITALS — BP 134/58 | HR 53 | Temp 98.3°F | Ht 62.0 in | Wt 132.0 lb

## 2017-05-07 DIAGNOSIS — Z0000 Encounter for general adult medical examination without abnormal findings: Secondary | ICD-10-CM | POA: Diagnosis not present

## 2017-05-07 NOTE — Patient Instructions (Signed)
Brittney Gutierrez , Thank you for taking time to come for your Medicare Wellness Visit. I appreciate your ongoing commitment to your health goals. Please review the following plan we discussed and let me know if I can assist you in the future.   Screening recommendations/referrals: Colonoscopy up to date Mammogram up to date Bone Density up to date Recommended yearly ophthalmology/optometry visit for glaucoma screening and checkup Recommended yearly dental visit for hygiene and checkup  Vaccinations: Influenza vaccine due 09/19/80 Pneumococcal vaccine up to date Tdap vaccine due 09/08/2022 Shingles vaccine up to date. If you want the new shingles vaccine let us know and we will send over prescription to your pharmacy  Advanced directives: In Chart  Conditions/risks identified: None  Next appointment: Brittney Billow, NP 05/15/80 @ 2:15pm   Preventive Care 81 Years and Older, Female, Female Preventive care refers to lifestyle choices and visits with your health care provider that can promote health and wellness. What does preventive care include?  A yearly physical exam. This is also called an annual well check.  Dental exams once or twice a year.  Routine eye exams. Ask your health care provider how often you should have your eyes checked.  Personal lifestyle choices, including:  Daily care of your teeth and gums.  Regular physical activity.  Eating a healthy diet.  Avoiding tobacco and drug use.  Limiting alcohol use.  Practicing safe sex.  Taking low-dose aspirin every day.  Taking vitamin and mineral supplements as recommended by your health care provider. What happens during an annual well check? The services and screenings done by your health care provider during your annual well check will depend on your age, overall health, lifestyle risk factors, and family history of disease. Counseling  Your health care provider may ask you questions about your:  Alcohol use.  Tobacco  use.  Drug use.  Emotional well-being.  Home and relationship well-being.  Sexual activity.  Eating habits.  History of falls.  Memory and ability to understand (cognition).  Work and work Statistician.  Reproductive health. Screening  You may have the following tests or measurements:  Height, weight, and BMI.  Blood pressure.  Lipid and cholesterol levels. These may be checked every 5 years, or more frequently if you are over 12 years old.  Skin check.  Lung cancer screening. You may have this screening every year starting at age 81 if you have a 30-pack-year history of smoking and currently smoke or have quit within the past 15 years.  Fecal occult blood test (FOBT) of the stool. You may have this test every year starting at age 81.  Flexible sigmoidoscopy or colonoscopy. You may have a sigmoidoscopy every 5 years or a colonoscopy every 10 years starting at age 81.  Hepatitis C blood test.  Hepatitis B blood test.  Sexually transmitted disease (STD) testing.  Diabetes screening. This is done by checking your blood sugar (glucose) after you have not eaten for a while (fasting). You may have this done every 1-3 years.  Bone density scan. This is done to screen for osteoporosis. You may have this done starting at age 81.  Mammogram. This may be done every 1-2 years. Talk to your health care provider about how often you should have regular mammograms. Talk with your health care provider about your test results, treatment options, and if necessary, the need for more tests. Vaccines  Your health care provider may recommend certain vaccines, such as:  Influenza vaccine. This is recommended every year.  Tetanus, diphtheria, and acellular pertussis (Tdap, Td) vaccine. You may need a Td booster every 10 years.  Zoster vaccine. You may need this after age 81.  Pneumococcal 13-valent conjugate (PCV13) vaccine. One dose is recommended after age 81.  Pneumococcal  polysaccharide (PPSV23) vaccine. One dose is recommended after age 81. Talk to your health care provider about which screenings and vaccines you need and how often you need them. This information is not intended to replace advice given to you by your health care provider. Make sure you discuss any questions you have with your health care provider. Document Released: 12/22/2015 Document Revised: 08/14/2016 Document Reviewed: 09/26/2015 Elsevier Interactive Patient Education  2017 [place] Prevention in the Home Falls can cause injuries. They can happen to people of all ages. There are many things you can do to make your home safe and to help prevent falls. What can I do on the outside of my home?  Regularly fix the edges of walkways and driveways and fix any cracks.  Remove anything that might make you trip as you walk through a door, such as a raised step or threshold.  Trim any bushes or trees on the path to your home.  Use bright outdoor lighting.  Clear any walking paths of anything that might make someone trip, such as rocks or tools.  Regularly check to see if handrails are loose or broken. Make sure that both sides of any steps have handrails.  Any raised decks and porches should have guardrails on the edges.  Have any leaves, snow, or ice cleared regularly.  Use sand or salt on walking paths during winter.  Clean up any spills in your garage right away. This includes oil or grease spills. What can I do in the bathroom?  Use night lights.  Install grab bars by the toilet and in the tub and shower. Do not use towel bars as grab bars.  Use non-skid mats or decals in the tub or shower.  If you need to sit down in the shower, use a plastic, non-slip stool.  Keep the floor dry. Clean up any water that spills on the floor as soon as it happens.  Remove soap buildup in the tub or shower regularly.  Attach bath mats securely with double-sided non-slip rug  tape.  Do not have throw rugs and other things on the floor that can make you trip. What can I do in the bedroom?  Use night lights.  Make sure that you have a light by your bed that is easy to reach.  Do not use any sheets or blankets that are too big for your bed. They should not hang down onto the floor.  Have a firm chair that has side arms. You can use this for support while you get dressed.  Do not have throw rugs and other things on the floor that can make you trip. What can I do in the kitchen?  Clean up any spills right away.  Avoid walking on wet floors.  Keep items that you use a lot in easy-to-reach places.  If you need to reach something above you, use a strong step stool that has a grab bar.  Keep electrical cords out of the way.  Do not use floor polish or wax that makes floors slippery. If you must use wax, use non-skid floor wax.  Do not have throw rugs and other things on the floor that can make you trip. What can I do  with my stairs?  Do not leave any items on the stairs.  Make sure that there are handrails on both sides of the stairs and use them. Fix handrails that are broken or loose. Make sure that handrails are as long as the stairways.  Check any carpeting to make sure that it is firmly attached to the stairs. Fix any carpet that is loose or worn.  Avoid having throw rugs at the top or bottom of the stairs. If you do have throw rugs, attach them to the floor with carpet tape.  Make sure that you have a light switch at the top of the stairs and the bottom of the stairs. If you do not have them, ask someone to add them for you. What else can I do to help prevent falls?  Wear shoes that:  Do not have high heels.  Have rubber bottoms.  Are comfortable and fit you well.  Are closed at the toe. Do not wear sandals.  If you use a stepladder:  Make sure that it is fully opened. Do not climb a closed stepladder.  Make sure that both sides of the  stepladder are locked into place.  Ask someone to hold it for you, if possible.  Clearly mark and make sure that you can see:  Any grab bars or handrails.  First and last steps.  Where the edge of each step is.  Use tools that help you move around (mobility aids) if they are needed. These include:  Canes.  Walkers.  Scooters.  Crutches.  Turn on the lights when you go into a dark area. Replace any light bulbs as soon as they burn out.  Set up your furniture so you have a clear path. Avoid moving your furniture around.  If any of your floors are uneven, fix them.  If there are any pets around you, be aware of where they are.  Review your medicines with your doctor. Some medicines can make you feel dizzy. This can increase your chance of falling. Ask your doctor what other things that you can do to help prevent falls. This information is not intended to replace advice given to you by your health care provider. Make sure you discuss any questions you have with your health care provider. Document Released: 09/21/2009 Document Revised: 05/02/2016 Document Reviewed: 12/30/2014 Elsevier Interactive Patient Education  2017 [employer].

## 2017-05-07 NOTE — Progress Notes (Signed)
Quick Notes   Health Maintenance: None     Abnormal Screen: 14/30 MMSE. Did not pass clock drawing. PHQ-9     Patient Concerns: Diarrhea possibly from protonix and bactrim-went over probiotics with them. Slight chest pain-no other symptoms of anything more critical. Tingling all over body, would like to go over medications with you when they see you.     Nurse Concerns: None

## 2017-05-07 NOTE — Progress Notes (Signed)
Subjective:   Brittney Gutierrez is a 83 y.o. female who presents for Medicare Annual (Subsequent) preventive examination.        Objective:     Vitals: BP (!) 134/58 (BP Location: Left Arm, Patient Position: Sitting)   Pulse (!) 53   Temp 98.3 F (36.8 C) (Oral)   Ht 5\' 2"  (1.575 m)   Wt 132 lb (59.9 kg)   SpO2 90%   BMI 24.14 kg/m   Body mass index is 24.14 kg/m.   Tobacco History  Smoking Status  . Never Smoker  Smokeless Tobacco  . Never Used     Counseling given: Not Answered   Past Medical History:  Diagnosis Date  . Abnormal weight loss 05/28/2004  . Anxiety 01/17/2003  . Arthritis    "a little; qwhere" (5/25/20170  . Back pain 08/19/2005   with radiculopathy  . Back pain 05/07/2017  . Bradycardia, drug induced   . Cerebral atherosclerosis 09/24/1999  . Chest pain, atypical    cath 05/03/2016 for recurrent CP, mild nonobstructive dx. Mild to moderate R renal artery stenosis, patent L renal artery  . Dementia    "don't know kind or stage" (05/02/2016)  . Depression 09/06/2002  . Diverticulitis 09/28/2007  . Diverticulosis of colon 06/07/2003  . External hemorrhoids 04/12/2009  . Female climacteric state 03/04/2000  . Formed visual hallucinations    [name]?  Failed Keppra and Depakote  . GERD (gastroesophageal reflux disease)   . Hallucination, visual   . Hypertension   . Hypertension    cath 05/03/2016 Mild to moderate R renal artery stenosis, patent L renal artery  . Leg pain, left   . Macular degeneration   . Malaise and fatigue   . 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 Osteoarthrosis, localized   . Osteoporosis 05/28/2004  . Palpitations 11/16/2001  . Paranoia ([place])   . Paroxysmal A-fib ([place])   . PSVT (paroxysmal supraventricular tachycardia) ([place]) 02/22/2002  . Pure hypercholesterolemia 12/20/1999  . TIA (transient ischemic attack) 2000s?  [name] Urinary incontinence 08/18/2007  . UTI (urinary tract infection)   .  Vaginitis, atrophic   . Vertigo, peripheral 10/07/2000   Past Surgical History:  Procedure Laterality Date  . BRAIN SURGERY  2000   Neurocysticercosis ("parasite")  . CARDIAC CATHETERIZATION N/A 05/03/2016   Procedure: Left Heart Cath and Coronary Angiography;  Surgeon: Sherren Mocha, MD;  Location: [place] [facility];  Service: Cardiovascular;  Laterality: N/A;  . CATARACT EXTRACTION  2013  . CATARACT EXTRACTION, BILATERAL     "not sure if both eyes; feel like it probably was"  . JOINT REPLACEMENT    . PERIPHERAL VASCULAR CATHETERIZATION N/A 05/03/2016   Procedure: Abdominal Aortogram;  Surgeon: Sherren Mocha, MD;  Location: [place] [facility];  Service: Cardiovascular;  Laterality: N/A;  . SHOULDER ARTHROSCOPY W/ ROTATOR CUFF REPAIR Right X 3  . TOTAL KNEE ARTHROPLASTY Left 1990  . VAGINAL HYSTERECTOMY  1960   Family History  Problem Relation Age of Onset  . Polycystic kidney disease Mother   . Heart attack Father   . Hypertension Sister   . Dementia Neg Hx    History  Sexual Activity  . Sexual activity: No    Outpatient Encounter Prescriptions as of 05/07/2017  Medication Sig  . acetaminophen (TYLENOL) 325 MG tablet Take 650 mg by mouth every 6 (six) hours as needed for mild pain.  [name] amLODipine (NORVASC) 5 MG tablet Take 1 tablet (5 mg  total) by mouth daily.  [name] apixaban (ELIQUIS) 2.5 MG TABS tablet Take 1 tablet (2.5 mg total) by mouth 2 (two) times daily.  . Cholecalciferol (VITAMIN D3) 2000 units TABS Take 2,000 Units by mouth daily.   [name] docusate sodium (COLACE) 100 MG capsule Take 100 mg by mouth 2 (two) times daily.   [name] losartan (COZAAR) 100 MG tablet Take 1 tablet (100 mg total) by mouth daily.  . Melatonin 3 MG TABS Take 3 mg by mouth at bedtime.   . Memantine HCl-Donepezil HCl (NAMZARIC) 28-10 MG CP24 Take 1 capsule by mouth daily.  . metoprolol tartrate (LOPRESSOR) 25 MG tablet Take 1 tablet (25 mg total) by mouth 2 (two) times daily.  . pantoprazole (PROTONIX) 40  MG tablet Take 1 tablet (40 mg total) by mouth daily.  [name] sulfamethoxazole-trimethoprim (BACTRIM DS,SEPTRA DS) 800-160 MG tablet Take 1 tablet by mouth 2 (two) times daily.  [name] venlafaxine (EFFEXOR) 37.5 MG tablet Take 1 tablet (37.5 mg total) by mouth 2 (two) times daily.   No facility-administered encounter medications on file as of 05/07/2017.     Activities of Daily Living In your present state of health, do you have any difficulty performing the following activities: 05/07/2017  Hearing? N  Vision? Y  Difficulty concentrating or making decisions? Y  Walking or climbing stairs? N  Dressing or bathing? N  Doing errands, shopping? Y  Preparing Food and eating ? N  Using the Toilet? Y  In the past six months, have you accidently leaked urine? Y  Do you have problems with loss of bowel control? N  Managing your Medications? Y  Managing your Finances? Y  Housekeeping or managing your Housekeeping? Y  Some recent data might be hidden    Patient Care Team: Lauree Chandler, NP as PCP - General (Geriatric Medicine) Sanda Klein, MD as Consulting Physician (Cardiology) Sherlynn Stalls, MD as Consulting Physician (Ophthalmology) Murriel Hopper, MD as Referring Physician (Student)    Assessment:    Exercise Activities and Dietary recommendations Current Exercise Habits: The patient does not participate in regular exercise at present, Exercise limited by: None identified  Goals    . Exercise 3x per week (30 min per time)          Starting today pt will walk several times a week with people in her community       Fall Risk Fall Risk  05/07/2017 04/15/2017 03/24/2017 02/03/2017 01/02/2017  Falls in the past year? Yes Yes No No Yes  Number falls in past yr: 1 1 - - 1  Injury with Fall? Yes Yes - - No   Depression Screen PHQ 2/9 Scores 05/07/2017 04/15/2017 03/24/2017 04/18/2016  PHQ - 2 Score 3 6 0 0  PHQ- 9 Score 9 14 - -     Cognitive Function MMSE - Mini Mental State Exam  05/07/2017 02/08/2016  Not completed: - (No Data)  Orientation to time 4 3  Orientation to Place 1 2  Registration 3 3  Attention/ Calculation 0 4  Recall 0 1  Language- name 2 objects 2 2  Language- repeat 0 0  Language- follow 3 step command 2 3  Language- read & follow direction 1 1  Write a sentence 1 1  Copy design 0 0  Total score 14 20        Immunization History  Administered Date(s) Administered  . Influenza Split 11/02/1999, 10/18/2000, 10/22/2001, 09/18/2002, 09/23/2003, 09/22/2004, 10/10/2005, 09/18/2006, 10/19/2007, 09/21/2008  . Influenza Whole  10/09/2009, 09/21/2010, 10/04/2011, 09/04/2012, 09/09/2013  . Influenza, High Dose Seasonal PF 09/23/2014, 08/30/2015  . Influenza,inj,Quad PF,36+ Mos 09/19/2016  . Influenza-Unspecified 12/09/2014  . Pneumococcal Conjugate-13 10/24/2015  . Pneumococcal-Unspecified 10/19/2007  . Tdap 05/14/2012, 09/08/2012  . Zoster 10/14/2012   Screening Tests Health Maintenance  Topic Date Due  . INFLUENZA VACCINE  07/09/2017  . TETANUS/TDAP  09/08/2022  . DEXA SCAN  Completed  . PNA vac Low Risk Adult  Completed      Plan:    I have personally reviewed and addressed the Medicare Annual Wellness questionnaire and have noted the following in the patient's chart:  A. Medical and social history B. Use of alcohol, tobacco or illicit drugs  C. Current medications and supplements D. Functional ability and status E.  Nutritional status F.  Physical activity G. Advance directives H. List of other physicians I.  Hospitalizations, surgeries, and ER visits in previous 12 months J.  [place] to include hearing, vision, cognitive, depression L. Referrals and appointments - none  In addition, I have reviewed and discussed with patient certain preventive protocols, quality metrics, and best practice recommendations. A written personalized care plan for preventive services as well as general preventive health recommendations were  provided to patient.  See attached scanned questionnaire for additional information.   Signed,   Rich Reining, RN Nurse Health Advisor  I have reviewed the health advisor's note and was available for consultation. I agree with documentation and plan.   Keianna Signer S. Perlie Gold  [facility] [address] [place], [place] [address] [phone] Cell (Monday-Friday 8 AM - 5 PM) [phone] After 5 PM and follow prompts

## 2017-05-08 ENCOUNTER — Telehealth: Payer: Self-pay

## 2017-05-08 NOTE — Telephone Encounter (Signed)
Message left on clinical intake voicemail:   Please return call  Spoke with [name] (patient's daughter), patient was seen yesterday for AWV. Patient called her daughter at 4 am today, patient states she is really sick and needs her daughter to come sit with her. [name] is concerned about the changes in her mother within the last month, [name] does not know if a medication is causing these changes or what is going on, but something happens to her mother in the early morning hours and its bothersome. Patient can not explain what she means when she says "Im sick," [name] would like for her mother to see a doctor today if possible.  No available appointment's , patient scheduled to be seen tomorrow with Dr.Reed @ 11am. [name] would like any recommendations that Dr.Reed has prior to appointment. [name] is trying to get her mother assistance at home yet the patient went off on her daughter and ended call at the mention of Home Health. [name] is also considering counseling for her mother. [name] is aware I will call if Dr.Reed has a cancellation today, otherwise patient to be seen tomorrow.  [name] preferred that I not call her mother to inquire about what's going on.

## 2017-05-09 ENCOUNTER — Encounter: Payer: Self-pay | Admitting: Internal Medicine

## 2017-05-09 ENCOUNTER — Ambulatory Visit (INDEPENDENT_AMBULATORY_CARE_PROVIDER_SITE_OTHER): Payer: Medicare Other | Admitting: Internal Medicine

## 2017-05-09 VITALS — BP 150/62 | HR 54 | Temp 98.5°F | Ht 62.0 in | Wt 130.0 lb

## 2017-05-09 DIAGNOSIS — F02818 Dementia in other diseases classified elsewhere, unspecified severity, with other behavioral disturbance: Secondary | ICD-10-CM

## 2017-05-09 DIAGNOSIS — F329 Major depressive disorder, single episode, unspecified: Secondary | ICD-10-CM

## 2017-05-09 DIAGNOSIS — F0281 Dementia in other diseases classified elsewhere with behavioral disturbance: Secondary | ICD-10-CM | POA: Diagnosis not present

## 2017-05-09 DIAGNOSIS — R5381 Other malaise: Secondary | ICD-10-CM | POA: Diagnosis not present

## 2017-05-09 DIAGNOSIS — F419 Anxiety disorder, unspecified: Secondary | ICD-10-CM | POA: Diagnosis not present

## 2017-05-09 DIAGNOSIS — G301 Alzheimer's disease with late onset: Secondary | ICD-10-CM | POA: Diagnosis not present

## 2017-05-09 DIAGNOSIS — F32A Depression, unspecified: Secondary | ICD-10-CM

## 2017-05-09 DIAGNOSIS — R5383 Other fatigue: Secondary | ICD-10-CM

## 2017-05-09 MED ORDER — MEMANTINE HCL-DONEPEZIL HCL 7 & 14 & 21 &28 -10 MG PO C4PK: 1.0000 | EXTENDED_RELEASE_CAPSULE | Freq: Every day | ORAL | 0 refills | Status: DC

## 2017-05-09 NOTE — Patient Instructions (Signed)
Do namzaric titration pack backwards from 21/10 down to 7/10mg  and then stop.

## 2017-05-09 NOTE — Progress Notes (Signed)
Location:  [facility] clinic Provider:  Arlenis Blaydes L. Mariea Clonts, D.O., C.M.D.  Code Status: DNR Goals of Care:  Advanced Directives 05/07/2017  Does Patient Have a Medical Advance Directive? Yes  Type of Paramedic of [place];Living will  Does patient want to make changes to medical advance directive? No - Patient declined  Copy of [place] in Chart? Yes  Would patient like information on creating a medical advance directive? -   Chief Complaint  Patient presents with  . Medical Management of Chronic Issues    per daughter past month "sick" . Went to ED 05/03/16 UTI given Septra on 05/10/17.But didn't take it daily per daughters it made her feel bad. Here with daughters [name] and [name].     HPI: Patient is a 83 y.o. female seen today for medical management of chronic diseases.    Says she has not been able to come back up since about 3-4 weeks ago.  She's been getting out of her room and socializing, but she's not making progress.  Her daughters note it's longer than that.  Was off effexor and on zoloft and not effective.    The daughters think the namzaric made her do more throat clearing, she got more depressed, was hurting and not feeling well and calling a lot (children).  Always worse in the mornings.  Called [name] at 430am yesterday.  Not sleeping soundly.  She wants someone with her.  Starts to feel sorry for herself.  When she woke up this morning, she had a pain in her chest and could tell she was worked up and if she sits and calms down, she feels better.  She is having a pressure in the chest in the mornings.    She had been seeing people and colors.  She may have some degree of Charles-Bonnet not just hallucinations.    Had great report at cardiology on 5/24.  Last mmse was 14/30 2 days ago.  She is adamantly opposed to having a sitter with her.    Past Medical History:  Diagnosis Date  . Abnormal weight loss 05/28/2004  . Anxiety 01/17/2003  .  Arthritis    "a little; qwhere" (5/25/20170  . Back pain 08/19/2005   with radiculopathy  . Back pain 05/07/2017  . Bradycardia, drug induced   . Cerebral atherosclerosis 09/24/1999  . Chest pain, atypical    cath 05/03/2016 for recurrent CP, mild nonobstructive dx. Mild to moderate R renal artery stenosis, patent L renal artery  . Dementia    "don't know kind or stage" (05/02/2016)  . Depression 09/06/2002  . Diverticulitis 09/28/2007  . Diverticulosis of colon 06/07/2003  . External hemorrhoids 04/12/2009  . Female climacteric state 03/04/2000  . Formed visual hallucinations    [name]?  Failed Keppra and Depakote  . GERD (gastroesophageal reflux disease)   . Hallucination, visual   . Hypertension   . Hypertension    cath 05/03/2016 Mild to moderate R renal artery stenosis, patent L renal artery  . Leg pain, left   . Macular degeneration   . Malaise and fatigue   . Neurocysticercosis    Craniotomy at [facility] in early 2000s  . Osteoarthrosis, localized   . Osteoporosis 05/28/2004  . Palpitations 11/16/2001  . Paranoia ([place])   . Paroxysmal A-fib ([place])   . PSVT (paroxysmal supraventricular tachycardia) ([place]) 02/22/2002  . Pure hypercholesterolemia 12/20/1999  . TIA (transient ischemic attack) 2000s?  [name] Urinary incontinence 08/18/2007  . UTI (  urinary tract infection)   . Vaginitis, atrophic   . Vertigo, peripheral 10/07/2000    Past Surgical History:  Procedure Laterality Date  . BRAIN SURGERY  2000   Neurocysticercosis ("parasite")  . CARDIAC CATHETERIZATION N/A 05/03/2016   Procedure: Left Heart Cath and Coronary Angiography;  Surgeon: Sherren Mocha, MD;  Location: [place] [facility];  Service: Cardiovascular;  Laterality: N/A;  . CATARACT EXTRACTION  2013  . CATARACT EXTRACTION, BILATERAL     "not sure if both eyes; feel like it probably was"  . JOINT REPLACEMENT    . PERIPHERAL VASCULAR CATHETERIZATION N/A 05/03/2016   Procedure: Abdominal Aortogram;  Surgeon: Sherren Mocha, MD;  Location: [place] [facility];  Service: Cardiovascular;  Laterality: N/A;  . SHOULDER ARTHROSCOPY W/ ROTATOR CUFF REPAIR Right X 3  . TOTAL KNEE ARTHROPLASTY Left 1990  . VAGINAL HYSTERECTOMY  1960    Allergies  Allergen Reactions  . Amiodarone Itching  . Amoxicillin Other (See Comments)    Unknown- ? Upset stomach  . Demerol [Meperidine] Other (See Comments)    Hallucinations  . Hydralazine Other (See Comments)    Tingling and chest pain   . Lisinopril Cough  . Zoloft [Sertraline Hcl] Other (See Comments)    Unknown  . Tape Rash and Other (See Comments)    Can tear the skin, also    Allergies as of 05/09/2017      Reactions   Amiodarone Itching   Amoxicillin Other (See Comments)   Unknown- ? Upset stomach   Demerol [meperidine] Other (See Comments)   Hallucinations   Hydralazine Other (See Comments)   Tingling and chest pain    Lisinopril Cough   Zoloft [sertraline Hcl] Other (See Comments)   Unknown   Tape Rash, Other (See Comments)   Can tear the skin, also      Medication List       Accurate as of 05/09/17 11:30 AM. Always use your most recent med list.          acetaminophen 325 MG tablet Commonly known as:  TYLENOL Take 650 mg by mouth every 6 (six) hours as needed for mild pain.   amLODipine 5 MG tablet Commonly known as:  NORVASC Take 1 tablet (5 mg total) by mouth daily.   apixaban 2.5 MG Tabs tablet Commonly known as:  ELIQUIS Take 1 tablet (2.5 mg total) by mouth 2 (two) times daily.   docusate sodium 100 MG capsule Commonly known as:  COLACE Take 100 mg by mouth 2 (two) times daily.   losartan 100 MG tablet Commonly known as:  COZAAR Take 1 tablet (100 mg total) by mouth daily.   Melatonin 3 MG Tabs Take 3 mg by mouth at bedtime.   Memantine HCl-Donepezil HCl 28-10 MG Cp24 Commonly known as:  NAMZARIC Take 1 capsule by mouth daily.   metoprolol tartrate 25 MG tablet Commonly known as:  LOPRESSOR Take 1 tablet (25 mg  total) by mouth 2 (two) times daily.   pantoprazole 40 MG tablet Commonly known as:  PROTONIX Take 1 tablet (40 mg total) by mouth daily.   venlafaxine 37.5 MG tablet Commonly known as:  EFFEXOR Take 1 tablet (37.5 mg total) by mouth 2 (two) times daily.   Vitamin D3 2000 units Tabs Take 2,000 Units by mouth daily.       Review of Systems:  Review of Systems  Constitutional: Positive for malaise/fatigue and weight loss. Negative for chills and fever.  HENT: Positive for hearing  loss.   Eyes:       Macular degeneration present and may have charles-bonnet  Respiratory: Negative for cough and shortness of breath.   Cardiovascular: Negative for chest pain, palpitations and leg swelling.  Gastrointestinal: Positive for heartburn. Negative for abdominal pain, blood in stool, constipation, diarrhea, nausea and vomiting.  Genitourinary: Negative for dysuria.  Musculoskeletal: Negative for falls and myalgias.  Skin: Negative for itching and rash.  Neurological: Positive for weakness. Negative for dizziness.  Endo/Heme/Allergies: Bruises/bleeds easily.  Psychiatric/Behavioral: Positive for depression and memory loss. Negative for hallucinations and suicidal ideas. The patient is nervous/anxious and has insomnia.        No mention of hallucinations or visions    Health Maintenance  Topic Date Due  . INFLUENZA VACCINE  07/09/2017  . TETANUS/TDAP  09/08/2022  . DEXA SCAN  Completed  . PNA vac Low Risk Adult  Completed    Physical Exam: Vitals:   05/09/17 1108  BP: (!) 150/62  Pulse: (!) 54  Temp: 98.5 F (36.9 C)  TempSrc: Oral  SpO2: 97%  Weight: 130 lb (59 kg)  Height: 5\' 2"  (1.575 m)   Body mass index is 23.78 kg/m. Physical Exam  Constitutional: She appears well-developed. No distress.  Has lost weight  Cardiovascular: Normal rate, regular rhythm, normal heart sounds and intact distal pulses.   Pulmonary/Chest: Effort normal and breath sounds normal. No respiratory  distress.  Abdominal: Soft. Bowel sounds are normal.  Neurological: She is alert.  Oriented to person and place, not time, poor historian, repeats herself frequently during visit  Skin: Skin is warm and dry.  Psychiatric:  Got very agitated when discussing her early am phone calls to family and idea of a caregiver or assisted living level of care    Labs reviewed: Basic Metabolic Panel:  Recent Labs  11/27/16 0240 05/01/17 1129 05/03/17 0808  NA 137 140 137  K 3.4* 3.3* 3.0*  CL 107 105 103  CO2 21* 21 26  GLUCOSE 110* 97 101*  BUN 18 11 11   CREATININE 1.00 1.05* 1.22*  CALCIUM 8.3* 9.0 9.1   Liver Function Tests:  Recent Labs  09/19/16 1215 05/01/17 1129  AST 23 17  ALT 12 11  ALKPHOS 84 87  BILITOT 0.5 0.6  PROT 7.3 6.8  ALBUMIN 3.9 3.6    Recent Labs  05/01/17 1119  LIPASE 48  AMYLASE 46   No results for input(s): AMMONIA in the last 8760 hours. CBC:  Recent Labs  11/27/16 0240 05/01/17 1129 05/03/17 0808  WBC 6.0 7.6 5.2  NEUTROABS 3.6 5,928 3.7  HGB 9.9* 11.4* 10.8*  HCT 30.8* 34.6* 33.7*  MCV 91.1 89.6 91.6  PLT 201 240 238   Lipid Panel: No results for input(s): CHOL, HDL, LDLCALC, TRIG, CHOLHDL, LDLDIRECT in the last 8760 hours. Lab Results  Component Value Date   HGBA1C 6.2 (H) 05/02/2016    Assessment/Plan 1. Late onset Alzheimer's disease with behavioral disturbance -unfortunately, family is noting a correlation b/w pt's declining status and use of namzaric -unusual for this to cause her decline, panic attacks and depression, but will try to taper her back off and see how she is in 3 weeks - Memantine HCl-Donepezil HCl (NAMZARIC) 7 & 14 & 21 &28 -10 MG C4PK; Take 1 capsule by mouth daily. Take pack backwards  Dispense: 28 each; Refill: 0  2. Anxiety and depression -seems she is having am panic attacks -was on xanax when she came to see [place] but  we tapered her off due to risks of benzos in geriatric pts especially those with  cognitive losses, but seems she may actually require a benzo to calm her during these episodes which often occur first thing in the am  3. Malaise and fatigue -she c/o these, but is not eating well or doing much these days, seems quite down, but then says she is happy where she is and overall -reports feeling abandoned by family though they are cleavly supportive of her  Encourage her daughters to read and learn more about dementia.  [name] is trying to join a [place] AD support group.   They already have the Alzheimer's playbooks.  I spent quite a bit of time explaining to them that it is often hard to tell if a patient with memory loss truly is having anxiety. She has had quite a medical workup over time with ED visits and visits to our office and nothing new has been found.  Seems to me that she has a combination of dementia and panic attacks.  Labs/tests ordered:  No orders of the defined types were placed in this encounter.  Next appt:   05/29/17 to f/u on malaise/panic attacks/dementia    Nylene Inlow L. Leny Morozov, D.O. [facility] [address], [place] [address] Cell Phone (Mon-Fri 8am-5pm):  [phone] On Call:  [phone] & follow prompts after 5pm & weekends Office Phone:  [phone] Office Fax:  [phone]

## 2017-05-14 ENCOUNTER — Telehealth: Payer: Self-pay | Admitting: *Deleted

## 2017-05-14 NOTE — Telephone Encounter (Signed)
LMOM for Brittney Gutierrez to return call.

## 2017-05-14 NOTE — Telephone Encounter (Signed)
Patient daughter, [name] called and stated that patient has been complaining every morning with Tingling all over. Stated that it has been going on for a long time and complains with it a lot. Orthopaedic told her before it could be related to her back. Patient is very anxious in the mornings and wonders if this is what is going on. Stated that it is all over. Daughter wonders if something can be prescribed for her anxiety and this would help. No available appointments till Monday but daughter does not want to bring her in. Does not want to see Janett Billow wants to switch care to Dr. Mariea Clonts. Please Advise.

## 2017-05-14 NOTE — Telephone Encounter (Signed)
Begin lorazepam 0.5mg  q am for anxiety and panic attacks.  Suspect the tingling is a manifestation of her anxiety also

## 2017-05-15 ENCOUNTER — Ambulatory Visit: Payer: Medicare Other | Admitting: Internal Medicine

## 2017-05-15 MED ORDER — LORAZEPAM 0.5 MG PO TABS: ORAL_TABLET | ORAL | 1 refills | Status: DC

## 2017-05-15 NOTE — Telephone Encounter (Signed)
[place] notified and agreed. Rx printed and faxed to pharmacy.

## 2017-05-15 NOTE — Telephone Encounter (Signed)
LMOM to return call.

## 2017-05-23 NOTE — Telephone Encounter (Signed)
Patient seen Dr.Reed on 05/09/17

## 2017-05-27 ENCOUNTER — Ambulatory Visit (INDEPENDENT_AMBULATORY_CARE_PROVIDER_SITE_OTHER): Payer: Medicare Other

## 2017-05-27 DIAGNOSIS — M810 Age-related osteoporosis without current pathological fracture: Secondary | ICD-10-CM | POA: Diagnosis not present

## 2017-05-27 MED ORDER — DENOSUMAB 60 MG/ML ~~LOC~~ SOLN
60.0000 mg | Freq: Once | SUBCUTANEOUS | Status: AC
  Administered 2017-05-27: 60 mg via SUBCUTANEOUS

## 2017-05-29 ENCOUNTER — Encounter: Payer: Self-pay | Admitting: Internal Medicine

## 2017-05-29 ENCOUNTER — Ambulatory Visit (INDEPENDENT_AMBULATORY_CARE_PROVIDER_SITE_OTHER): Payer: Medicare Other | Admitting: Internal Medicine

## 2017-05-29 VITALS — BP 138/68 | HR 55 | Temp 98.3°F | Wt 134.0 lb

## 2017-05-29 DIAGNOSIS — N3945 Continuous leakage: Secondary | ICD-10-CM

## 2017-05-29 DIAGNOSIS — F329 Major depressive disorder, single episode, unspecified: Secondary | ICD-10-CM | POA: Diagnosis not present

## 2017-05-29 DIAGNOSIS — G301 Alzheimer's disease with late onset: Secondary | ICD-10-CM | POA: Diagnosis not present

## 2017-05-29 DIAGNOSIS — M5442 Lumbago with sciatica, left side: Secondary | ICD-10-CM | POA: Diagnosis not present

## 2017-05-29 DIAGNOSIS — F32A Depression, unspecified: Secondary | ICD-10-CM

## 2017-05-29 DIAGNOSIS — F0281 Dementia in other diseases classified elsewhere with behavioral disturbance: Secondary | ICD-10-CM

## 2017-05-29 DIAGNOSIS — F419 Anxiety disorder, unspecified: Secondary | ICD-10-CM | POA: Diagnosis not present

## 2017-05-29 DIAGNOSIS — G8929 Other chronic pain: Secondary | ICD-10-CM

## 2017-05-29 DIAGNOSIS — F02818 Dementia in other diseases classified elsewhere, unspecified severity, with other behavioral disturbance: Secondary | ICD-10-CM

## 2017-05-29 MED ORDER — GABAPENTIN 100 MG PO CAPS: 100.0000 mg | ORAL_CAPSULE | Freq: Every day | ORAL | 3 refills | Status: DC

## 2017-05-29 NOTE — Progress Notes (Signed)
Location:  [facility] clinic Provider:  Kiondre Grenz L. Mariea Clonts, D.O., C.M.D.  Code Status: DNR Goals of Care:  Advanced Directives 05/07/2017  Does Patient Have a Medical Advance Directive? Yes  Type of Paramedic of [place];Living will  Does patient want to make changes to medical advance directive? No - Patient declined  Copy of [place] in Chart? Yes  Would patient like information on creating a medical advance directive? -    Chief Complaint  Patient presents with  . Medical Management of Chronic Issues    3 week follow-up on malaise    HPI: Patient is a 83 y.o. female seen today for medical management of chronic diseases.    She has only 1-2 more days of namzaric.  Her daughter thinks she is doing a little better, but she feels like she's still lousy.   She is now complaining of back pain.  Had seen orthopedics for that, but PT not actually done b/c of her depressive period. Has OA in opposite hip of where pain goes down the leg.  Left leg hurts every time she moves  She was not doing the exercises given b/c they hurt.  She hates to almost walk and pain is affecting her sleep. Melatonin 6mg  not helping sleep either.    She seems much calmer than last visit.    Urinary incontinence is worse.  She'll go, but no sooner does she stand back up and she's got to go again.  Had negative urine sample here. Used 4 pads in the morning one day.    Past Medical History:  Diagnosis Date  . Abnormal weight loss 05/28/2004  . Anxiety 01/17/2003  . Arthritis    "a little; qwhere" (5/25/20170  . Back pain 08/19/2005   with radiculopathy  . Back pain 05/07/2017  . Bradycardia, drug induced   . Cerebral atherosclerosis 09/24/1999  . Chest pain, atypical    cath 05/03/2016 for recurrent CP, mild nonobstructive dx. Mild to moderate R renal artery stenosis, patent L renal artery  . Dementia    "don't know kind or stage" (05/02/2016)  . Depression 09/06/2002  .  Diverticulitis 09/28/2007  . Diverticulosis of colon 06/07/2003  . External hemorrhoids 04/12/2009  . Female climacteric state 03/04/2000  . Formed visual hallucinations    [name]?  Failed Keppra and Depakote  . GERD (gastroesophageal reflux disease)   . Hallucination, visual   . Hypertension   . Hypertension    cath 05/03/2016 Mild to moderate R renal artery stenosis, patent L renal artery  . Leg pain, left   . Macular degeneration   . Malaise and fatigue   . 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 Osteoarthrosis, localized   . Osteoporosis 05/28/2004  . Palpitations 11/16/2001  . Paranoia ([place])   . Paroxysmal A-fib ([place])   . PSVT (paroxysmal supraventricular tachycardia) ([place]) 02/22/2002  . Pure hypercholesterolemia 12/20/1999  . TIA (transient ischemic attack) 2000s?  [name] Urinary incontinence 08/18/2007  . UTI (urinary tract infection)   . Vaginitis, atrophic   . Vertigo, peripheral 10/07/2000    Past Surgical History:  Procedure Laterality Date  . BRAIN SURGERY  2000   Neurocysticercosis ("parasite")  . CARDIAC CATHETERIZATION N/A 05/03/2016   Procedure: Left Heart Cath and Coronary Angiography;  Surgeon: Sherren Mocha, MD;  Location: [place] [facility];  Service: Cardiovascular;  Laterality: N/A;  . CATARACT EXTRACTION  2013  . CATARACT EXTRACTION, BILATERAL     "  not sure if both eyes; feel like it probably was"  . JOINT REPLACEMENT    . PERIPHERAL VASCULAR CATHETERIZATION N/A 05/03/2016   Procedure: Abdominal Aortogram;  Surgeon: Sherren Mocha, MD;  Location: [place] [facility];  Service: Cardiovascular;  Laterality: N/A;  . SHOULDER ARTHROSCOPY W/ ROTATOR CUFF REPAIR Right X 3  . TOTAL KNEE ARTHROPLASTY Left 1990  . VAGINAL HYSTERECTOMY  1960    Allergies  Allergen Reactions  . Bactrim [Sulfamethoxazole-Trimethoprim] Nausea Only  . Amiodarone Itching  . Amoxicillin Other (See Comments)    Unknown- ? Upset stomach  . Demerol [Meperidine]  Other (See Comments)    Hallucinations  . Hydralazine Other (See Comments)    Tingling and chest pain   . Lisinopril Cough  . Zoloft [Sertraline Hcl] Other (See Comments)    Unknown  . Tape Rash and Other (See Comments)    Can tear the skin, also    Allergies as of 05/29/2017      Reactions   Bactrim [sulfamethoxazole-trimethoprim] Nausea Only   Amiodarone Itching   Amoxicillin Other (See Comments)   Unknown- ? Upset stomach   Demerol [meperidine] Other (See Comments)   Hallucinations   Hydralazine Other (See Comments)   Tingling and chest pain    Lisinopril Cough   Zoloft [sertraline Hcl] Other (See Comments)   Unknown   Tape Rash, Other (See Comments)   Can tear the skin, also      Medication List       Accurate as of 05/29/17 12:02 PM. Always use your most recent med list.          acetaminophen 325 MG tablet Commonly known as:  TYLENOL Take 650 mg by mouth every 6 (six) hours as needed for mild pain.   amLODipine 5 MG tablet Commonly known as:  NORVASC Take 1 tablet (5 mg total) by mouth daily.   apixaban 2.5 MG Tabs tablet Commonly known as:  ELIQUIS Take 1 tablet (2.5 mg total) by mouth 2 (two) times daily.   docusate sodium 100 MG capsule Commonly known as:  COLACE Take 100 mg by mouth 2 (two) times daily.   LORazepam 0.5 MG tablet Commonly known as:  ATIVAN Take one tablet by mouth every morning for anxiety and panic attacks.   losartan 100 MG tablet Commonly known as:  COZAAR Take 1 tablet (100 mg total) by mouth daily.   Melatonin 3 MG Tabs Take 3 mg by mouth at bedtime.   metoprolol tartrate 25 MG tablet Commonly known as:  LOPRESSOR Take 1 tablet (25 mg total) by mouth 2 (two) times daily.   venlafaxine 37.5 MG tablet Commonly known as:  EFFEXOR Take 1 tablet (37.5 mg total) by mouth 2 (two) times daily.   Vitamin D3 2000 units Tabs Take 2,000 Units by mouth daily.       Review of Systems:  Review of Systems  Constitutional:  Negative for chills, fever and malaise/fatigue.  HENT: Positive for hearing loss.   Eyes: Positive for blurred vision.       Glasses, macular  Respiratory: Negative for cough and shortness of breath.   Cardiovascular: Negative for chest pain, palpitations and leg swelling.  Gastrointestinal: Negative for abdominal pain, blood in stool, constipation and melena.  Genitourinary: Positive for urgency. Negative for dysuria, frequency and hematuria.  Musculoskeletal: Positive for back pain. Negative for falls.  Skin: Negative for itching and rash.  Neurological: Positive for tingling and sensory change. Negative for weakness.  Endo/Heme/Allergies: Does not  bruise/bleed easily.  Psychiatric/Behavioral: Positive for memory loss. The patient is nervous/anxious.     Health Maintenance  Topic Date Due  . INFLUENZA VACCINE  07/09/2017  . TETANUS/TDAP  09/08/2022  . DEXA SCAN  Completed  . PNA vac Low Risk Adult  Completed    Physical Exam: Vitals:   05/29/17 1148  BP: 138/68  Pulse: (!) 55  Temp: 98.3 F (36.8 C)  TempSrc: Oral  SpO2: 98%  Weight: 134 lb (60.8 kg)   Body mass index is 24.51 kg/m. Physical Exam  Constitutional: She appears well-developed and well-nourished. No distress.  Cardiovascular: Normal rate, regular rhythm, normal heart sounds and intact distal pulses.   Pulmonary/Chest: Effort normal and breath sounds normal. No respiratory distress.  Abdominal: Soft. Bowel sounds are normal. She exhibits no distension.  Musculoskeletal: Normal range of motion. She exhibits no tenderness.  Holds onto daughter to walk  Neurological: She is alert.  Skin: Skin is warm and dry.  Psychiatric: She has a normal mood and affect.  Appears calmer than last visit    Labs reviewed: Basic Metabolic Panel:  Recent Labs  11/27/16 0240 05/01/17 1129 05/03/17 0808  NA 137 140 137  K 3.4* 3.3* 3.0*  CL 107 105 103  CO2 21* 21 26  GLUCOSE 110* 97 101*  BUN 18 11 11   CREATININE  1.00 1.05* 1.22*  CALCIUM 8.3* 9.0 9.1   Liver Function Tests:  Recent Labs  09/19/16 1215 05/01/17 1129  AST 23 17  ALT 12 11  ALKPHOS 84 87  BILITOT 0.5 0.6  PROT 7.3 6.8  ALBUMIN 3.9 3.6    Recent Labs  05/01/17 1119  LIPASE 48  AMYLASE 46   No results for input(s): AMMONIA in the last 8760 hours. CBC:  Recent Labs  11/27/16 0240 05/01/17 1129 05/03/17 0808  WBC 6.0 7.6 5.2  NEUTROABS 3.6 5,928 3.7  HGB 9.9* 11.4* 10.8*  HCT 30.8* 34.6* 33.7*  MCV 91.1 89.6 91.6  PLT 201 240 238   Lipid Panel: No results for input(s): CHOL, HDL, LDLCALC, TRIG, CHOLHDL, LDLDIRECT in the last 8760 hours. Lab Results  Component Value Date   HGBA1C 6.2 (H) 05/02/2016    Assessment/Plan 1. Chronic midline low back pain with left-sided sciatica - this is keeping her up at night so will tx the sciatica pain with gabapentin which may help her also to sleep at night - gabapentin (NEURONTIN) 100 MG capsule; Take 1 capsule (100 mg total) by mouth at bedtime.  Dispense: 30 capsule; Refill: 3 -will d/c melatonin while on neurontin to avid oversedating her  2. Late onset Alzheimer's disease with behavioral disturbance -is tapering off namzaric b/c family believes this contributed to her anxiety and panic attacks  3. Anxiety and depression -started back with orazepam in the mornings for her extreme anxiety upon awakening -looks like this has helped--pt obsessing over back pain and sciatica instead today  4.  Continuous leakage of urine -now occurring just about all of the time and having to change pads frequently -was negative for UTI since last visit -suspect simply due to dementia progression -will need pelvic exam if not better off the aricept/namenda   Labs/tests ordered:  No orders of the defined types were placed in this encounter.  Next appt:  6 wks for f/u on bladder and back pain  Madie Cahn L. Rebeka Kimble, D.O. [facility] [address], [place] [address] Cell Phone (Mon-Fri 8am-5pm):  [phone]  On Call:  [phone] & follow prompts after 5pm & weekends Office Phone:  [phone] Office Fax:  [phone]

## 2017-06-10 ENCOUNTER — Telehealth: Payer: Self-pay

## 2017-06-10 ENCOUNTER — Other Ambulatory Visit: Payer: Self-pay

## 2017-06-10 MED ORDER — QUETIAPINE FUMARATE 25 MG PO TABS: 25.0000 mg | ORAL_TABLET | Freq: Every day | ORAL | 0 refills | Status: DC

## 2017-06-10 NOTE — Telephone Encounter (Signed)
Spoke with Pollie Meyer verbalized understanding of response. [name] states patient has her sister with her that is higher level functioning for the last few days   [name] states patients sister observed patient talking in sleep, still not sleeping well, and patient is very unsure of herself when it comes to walking. [name] would like to know if one medication can be decreased or discontinued to see if symptoms improved   [name] will consult with her sister to address AL placement in the future.

## 2017-06-10 NOTE — Telephone Encounter (Signed)
She may need another antipsychotic.  I recommend she try seroquel 25mg  at bedtime each night.

## 2017-06-10 NOTE — Telephone Encounter (Signed)
Medication sent to pharmacy and [facility] notified.

## 2017-06-10 NOTE — Telephone Encounter (Signed)
Either medication could potentially increase fall risk.  Also, progression of dementia increases falls on its own.  She has her significant degenerative disc disease of her lower back and neuropathy contributing to falls.  As we discussed at one of her recent appointments, pt really needs a higher level of care than independent.  She needs at least a caregiver in her apt to help with the morning routine or to move to assisted living.  She will eventually need an AL move anyway, so I recommend her family start working on those plans even if they are not immediate.

## 2017-06-10 NOTE — Telephone Encounter (Signed)
Patient's daughter [name] called requesting message to be sent to Dr.Reed. Patient had a fall 06/08/17 and 06/09/17 with some bruising and no major injuries. [name] questions if this is related to Gabapentin or Alprazolam (both prescribed within the last month)   Please advise

## 2017-06-10 NOTE — Telephone Encounter (Signed)
[name] called back after consulting with her sisters, sisters state that patient is paranoid. Patient had similar symptoms when she moved into the facility, symptoms subsided when patient was on aricept (was hard on stomach)   Patient was then switched to Namzeric and had bad side effects and Namzeric was discontinued. Symptoms improved for a while afterwards  [name] questions if patient's sister being in town has her thrown off in some was (sister here since Saturday 06/07/17)   [name] questions if patient needs a psych medication. Patient tried Abilify before with no results.   [name] was on her way to get patient at the time of call for patient is uneasy and very paranoid and has her medications all mixed up

## 2017-06-10 NOTE — Telephone Encounter (Signed)
Per Dr. Mariea Clonts in telephone note dated 06/10/2017 send in Seroquel 25 mg daily at bedtime.

## 2017-06-12 NOTE — Telephone Encounter (Signed)
Noted  

## 2017-06-12 NOTE — Telephone Encounter (Signed)
Sorry, that pt was recently put on Seroquel and that her mother was having falls and wondered if it was the ativan, pt been on ativan since 05/15/17. I advised to daughter she needs to give these medications a little more time to work. Daughter just wanted it noted that her mother has these changes ( which are the same as before)

## 2017-06-12 NOTE — Telephone Encounter (Signed)
What change does she want noted?

## 2017-06-12 NOTE — Telephone Encounter (Signed)
Daughter [name] calling stating there is a change in her mother and wanted it noted, per [name] she doesn't know if it's the ativan or not. Pt just started the seroquel and I advised she may need to let that go a few more days/weeks to see if that medication will be effective. Per daughter she will wait

## 2017-06-19 DIAGNOSIS — H353222 Exudative age-related macular degeneration, left eye, with inactive choroidal neovascularization: Secondary | ICD-10-CM | POA: Diagnosis not present

## 2017-06-20 DIAGNOSIS — M5136 Other intervertebral disc degeneration, lumbar region: Secondary | ICD-10-CM | POA: Diagnosis not present

## 2017-06-25 DIAGNOSIS — M545 Low back pain: Secondary | ICD-10-CM | POA: Diagnosis not present

## 2017-06-25 DIAGNOSIS — M48062 Spinal stenosis, lumbar region with neurogenic claudication: Secondary | ICD-10-CM | POA: Diagnosis not present

## 2017-06-27 ENCOUNTER — Ambulatory Visit (INDEPENDENT_AMBULATORY_CARE_PROVIDER_SITE_OTHER): Payer: Medicare Other | Admitting: Nurse Practitioner

## 2017-06-27 ENCOUNTER — Encounter: Payer: Self-pay | Admitting: Nurse Practitioner

## 2017-06-27 VITALS — BP 140/70 | HR 80 | Temp 97.4°F | Wt 128.0 lb

## 2017-06-27 DIAGNOSIS — F0281 Dementia in other diseases classified elsewhere with behavioral disturbance: Secondary | ICD-10-CM

## 2017-06-27 DIAGNOSIS — G301 Alzheimer's disease with late onset: Secondary | ICD-10-CM | POA: Diagnosis not present

## 2017-06-27 DIAGNOSIS — R8290 Unspecified abnormal findings in urine: Secondary | ICD-10-CM

## 2017-06-27 DIAGNOSIS — R5381 Other malaise: Secondary | ICD-10-CM | POA: Diagnosis not present

## 2017-06-27 DIAGNOSIS — F02818 Dementia in other diseases classified elsewhere, unspecified severity, with other behavioral disturbance: Secondary | ICD-10-CM

## 2017-06-27 DIAGNOSIS — R410 Disorientation, unspecified: Secondary | ICD-10-CM | POA: Diagnosis not present

## 2017-06-27 DIAGNOSIS — R5383 Other fatigue: Secondary | ICD-10-CM | POA: Diagnosis not present

## 2017-06-27 LAB — CBC WITH DIFFERENTIAL/PLATELET
BASOS ABS: 64 {cells}/uL (ref 0–200)
Basophils Relative: 1 %
EOS PCT: 1 %
Eosinophils Absolute: 64 cells/uL (ref 15–500)
HCT: 36.3 % (ref 35.0–45.0)
Hemoglobin: 12 g/dL (ref 11.7–15.5)
Lymphocytes Relative: 20 %
Lymphs Abs: 1280 cells/uL (ref 850–3900)
MCH: 29.2 pg (ref 27.0–33.0)
MCHC: 33.1 g/dL (ref 32.0–36.0)
MCV: 88.3 fL (ref 80.0–100.0)
MONOS PCT: 8 %
MPV: 10.8 fL (ref 7.5–12.5)
Monocytes Absolute: 512 cells/uL (ref 200–950)
NEUTROS ABS: 4480 {cells}/uL (ref 1500–7800)
Neutrophils Relative %: 70 %
PLATELETS: 251 10*3/uL (ref 140–400)
RBC: 4.11 MIL/uL (ref 3.80–5.10)
RDW: 14.1 % (ref 11.0–15.0)
WBC: 6.4 10*3/uL (ref 3.8–10.8)

## 2017-06-27 LAB — BASIC METABOLIC PANEL WITH GFR
BUN: 34 mg/dL — ABNORMAL HIGH (ref 7–25)
CALCIUM: 9 mg/dL (ref 8.6–10.4)
CHLORIDE: 103 mmol/L (ref 98–110)
CO2: 19 mmol/L — ABNORMAL LOW (ref 20–31)
Creat: 1.23 mg/dL — ABNORMAL HIGH (ref 0.60–0.88)
GFR, Est African American: 46 mL/min — ABNORMAL LOW (ref >=60)
GFR, Est Non African American: 40 mL/min — ABNORMAL LOW (ref >=60)
Glucose, Bld: 104 mg/dL — ABNORMAL HIGH (ref 65–99)
Potassium: 4.2 mmol/L (ref 3.5–5.3)
SODIUM: 136 mmol/L (ref 135–146)

## 2017-06-27 LAB — POCT URINALYSIS DIPSTICK
Bilirubin, UA: NEGATIVE
Glucose, UA: NEGATIVE
KETONES UA: NEGATIVE
Nitrite, UA: POSITIVE
PH UA: 6.5 (ref 5.0–8.0)
SPEC GRAV UA: 1.015 (ref 1.010–1.025)
Urobilinogen, UA: NEGATIVE E.U./dL — AB

## 2017-06-27 LAB — TSH: TSH: 5.35 m[IU]/L — AB

## 2017-06-27 MED ORDER — CIPROFLOXACIN HCL 500 MG PO TABS: 500.0000 mg | ORAL_TABLET | Freq: Two times a day (BID) | ORAL | 0 refills | Status: DC

## 2017-06-27 NOTE — Progress Notes (Signed)
Careteam: Patient Care Team: Lauree Chandler, NP as PCP - General (Geriatric Medicine) Sanda Klein, MD as Consulting Physician (Cardiology) Sherlynn Stalls, MD as Consulting Physician (Ophthalmology) Murriel Hopper, MD as Referring Physician (Student)  Advanced Directive information    Allergies  Allergen Reactions  . Bactrim [Sulfamethoxazole-Trimethoprim] Nausea Only  . Amiodarone Itching  . Amoxicillin Other (See Comments)    Unknown- ? Upset stomach  . Demerol [Meperidine] Other (See Comments)    Hallucinations  . Hydralazine Other (See Comments)    Tingling and chest pain   . Lisinopril Cough  . Zoloft [Sertraline Hcl] Other (See Comments)    Unknown  . Tape Rash and Other (See Comments)    Can tear the skin, also    Chief Complaint  Patient presents with  . Acute Visit    not feeling well, not herself, not understanding directions,      HPI: Patient is a 83 y.o. female seen in the office today due to not feeling well. Pt with progressive dementia with anxiety. Was tapered off namzaric because family thought this was adding to her anxiety and panic attacks.  Daughter fells like pt is more confused this morning. She is not herself and "different".  Pt previously doing her own medication but now on med reminder (person comes and give her medication) which started yesterday. Daughter reports she was taking her night time medication during the day earlier this week therefore they decided to bring some help in with her medication.  Stopped lorazepam and gabapentin due to "side effects" on the 8th of July.   pt reports she fell this morning and that is why she was sitting in the living room.  Had previously fallen but that was when she was on lorazepam and gabapentin. No falls since. No injury or pain and pt is a poor historian when telling about a "fall" daughter unsure if is actually fell.  Daughter reports she was doing very well (after she came off lorazepam and  gabapentin) for a while but then has been progressively worsening for the last 2 weeks.  Questions if she is even sleeping. Very sleepy today during exam.  More confused.  Unsure if she ate anything yesterday.   Review of Systems:  Review of Systems  Unable to perform ROS: Dementia   Past Medical History:  Diagnosis Date  . Abnormal weight loss 05/28/2004  . Anxiety 01/17/2003  . Arthritis    "a little; qwhere" (5/25/20170  . Back pain 08/19/2005   with radiculopathy  . Back pain 05/07/2017  . Bradycardia, drug induced   . Cerebral atherosclerosis 09/24/1999  . Chest pain, atypical    cath 05/03/2016 for recurrent CP, mild nonobstructive dx. Mild to moderate R renal artery stenosis, patent L renal artery  . Dementia    "don't know kind or stage" (05/02/2016)  . Depression 09/06/2002  . Diverticulitis 09/28/2007  . Diverticulosis of colon 06/07/2003  . External hemorrhoids 04/12/2009  . Female climacteric state 03/04/2000  . Formed visual hallucinations    [name]?  Failed Keppra and Depakote  . GERD (gastroesophageal reflux disease)   . Hallucination, visual   . Hypertension   . Hypertension    cath 05/03/2016 Mild to moderate R renal artery stenosis, patent L renal artery  . Leg pain, left   . Macular degeneration   . Malaise and fatigue   . Neurocysticercosis    Craniotomy at [facility] in early 2000s  . Osteoarthrosis, localized   .  Osteoporosis 05/28/2004  . Palpitations 11/16/2001  . Paranoia ([place])   . Paroxysmal A-fib ([place])   . PSVT (paroxysmal supraventricular tachycardia) ([place]) 02/22/2002  . Pure hypercholesterolemia 12/20/1999  . TIA (transient ischemic attack) 2000s?  [name] Urinary incontinence 08/18/2007  . UTI (urinary tract infection)   . Vaginitis, atrophic   . Vertigo, peripheral 10/07/2000   Past Surgical History:  Procedure Laterality Date  . BRAIN SURGERY  2000   Neurocysticercosis ("parasite")  . CARDIAC CATHETERIZATION N/A 05/03/2016   Procedure: Left Heart  Cath and Coronary Angiography;  Surgeon: Sherren Mocha, MD;  Location: [place] [facility];  Service: Cardiovascular;  Laterality: N/A;  . CATARACT EXTRACTION  2013  . CATARACT EXTRACTION, BILATERAL     "not sure if both eyes; feel like it probably was"  . JOINT REPLACEMENT    . PERIPHERAL VASCULAR CATHETERIZATION N/A 05/03/2016   Procedure: Abdominal Aortogram;  Surgeon: Sherren Mocha, MD;  Location: [place] [facility];  Service: Cardiovascular;  Laterality: N/A;  . SHOULDER ARTHROSCOPY W/ ROTATOR CUFF REPAIR Right X 3  . TOTAL KNEE ARTHROPLASTY Left 1990  . VAGINAL HYSTERECTOMY  1960   Social History:   reports that she has never smoked. She has never used smokeless tobacco. She reports that she does not drink alcohol or use drugs.  Family History  Problem Relation Age of Onset  . Polycystic kidney disease Mother   . Heart attack Father   . Hypertension Sister   . Dementia Neg Hx     Medications: Patient's Medications  New Prescriptions   No medications on file  Previous Medications   ACETAMINOPHEN (TYLENOL) 325 MG TABLET    Take 650 mg by mouth every 6 (six) hours as needed for mild pain.   AMLODIPINE (NORVASC) 5 MG TABLET    Take 1 tablet (5 mg total) by mouth daily.   APIXABAN (ELIQUIS) 2.5 MG TABS TABLET    Take 1 tablet (2.5 mg total) by mouth 2 (two) times daily.   CHOLECALCIFEROL (VITAMIN D3) 2000 UNITS TABS    Take 2,000 Units by mouth daily.    DOCUSATE SODIUM (COLACE) 100 MG CAPSULE    Take 100 mg by mouth 2 (two) times daily.    LOSARTAN (COZAAR) 100 MG TABLET    Take 1 tablet (100 mg total) by mouth daily.   METOPROLOL TARTRATE (LOPRESSOR) 25 MG TABLET    Take 1 tablet (25 mg total) by mouth 2 (two) times daily.   QUETIAPINE (SEROQUEL) 25 MG TABLET    Take 1 tablet (25 mg total) by mouth at bedtime.   VENLAFAXINE (EFFEXOR) 37.5 MG TABLET    Take 1 tablet (37.5 mg total) by mouth 2 (two) times daily.  Modified Medications   No medications on file  Discontinued  Medications   GABAPENTIN (NEURONTIN) 100 MG CAPSULE    Take 1 capsule (100 mg total) by mouth at bedtime.   LORAZEPAM (ATIVAN) 0.5 MG TABLET    Take one tablet by mouth every morning for anxiety and panic attacks.     Physical Exam:  Vitals:   06/27/17 1123  BP: 140/70  Pulse: 80  Temp: (!) 97.4 F (36.3 C)  TempSrc: Oral  SpO2: 97%  Weight: 128 lb (58.1 kg)   Body mass index is 23.41 kg/m.  Physical Exam  Constitutional: She appears well-developed. No distress.  HENT:  Head: Normocephalic and atraumatic.  Right Ear: External ear normal.  Left Ear: External ear normal.  Mouth/Throat: Mucous membranes are dry.  Eyes: Pupils are equal, round, and reactive to light. EOM are normal.  Cardiovascular: Normal rate, regular rhythm, normal heart sounds and intact distal pulses.   Pulmonary/Chest: Effort normal and breath sounds normal. No respiratory distress.  Abdominal: Soft. Bowel sounds are normal. She exhibits no distension. There is no tenderness.  Musculoskeletal: Normal range of motion. She exhibits no tenderness.  Holds onto daughter to walk Good ROM and strength 4/5 bilaterally  Neurological: She is alert. No cranial nerve deficit or sensory deficit. She exhibits normal muscle tone.  Small jerking movements during visit.  Pt talkative but very confused and somewhat lethargic when not stimulated.  Skin: Skin is warm and dry.  Psychiatric: She has a normal mood and affect.    Labs reviewed: Basic Metabolic Panel:  Recent Labs  11/27/16 0240 05/01/17 1129 05/03/17 0808  NA 137 140 137  K 3.4* 3.3* 3.0*  CL 107 105 103  CO2 21* 21 26  GLUCOSE 110* 97 101*  BUN 18 11 11  CREATININE 1.00 1.05* 1.22*  CALCIUM 8.3* 9.0 9.1   Liver Function Tests:  Recent Labs  09/19/16 1215 05/01/17 1129  AST 23 17  ALT 12 11  ALKPHOS 84 87  BILITOT 0.5 0.6  PROT 7.3 6.8  ALBUMIN 3.9 3.6    Recent Labs  05/01/17 1119  LIPASE 48  AMYLASE 46   No results for  input(s): AMMONIA in the last 8760 hours. CBC:  Recent Labs  11/27/16 0240 05/01/17 1129 05/03/17 0808  WBC 6.0 7.6 5.2  NEUTROABS 3.6 5,928 3.7  HGB 9.9* 11.4* 10.8*  HCT 30.8* 34.6* 33.7*  MCV 91.1 89.6 91.6  PLT 201 240 238   Lipid Panel: No results for input(s): CHOL, HDL, LDLCALC, TRIG, CHOLHDL, LDLDIRECT in the last 8760 hours. TSH: No results for input(s): TSH in the last 8760 hours. A1C: Lab Results  Component Value Date   HGBA1C 6.2 (H) 05/02/2016     Assessment/Plan 1. Confusion with Malaise and fatigue -to stop seroquel at this time due to increase fatigue.  - CBC with Differential/Platelets - BMP with eGFR - TSH - POC Urinalysis Dipstick - Urine Culture -sleep/wake cycle possible off as well.  -daughter planning to stay with pt -encouraging proper PO intake and increase hydration  -will treat for UTI due to abnormal UA with increase confusion and lethargy  - ciprofloxacin (CIPRO) 500 MG tablet; Take 1 tablet (500 mg total) by mouth 2 (two) times daily.  Dispense: 14 tablet; Refill: 0  2. Late onset Alzheimer's disease with behavioral disturbance Progressive decline, strongly encouraged to consider assisted living for safety, medication management and increase supervision.   3. Abnormal urinalysis -UA showing nitrates, leukocytes  - Urine Culture ordered -cipro started at this time.   Labs ordered stat. Discussed with daughter for when to seek follow up at ED. She plans to stay with pt and monitor, encourage proper nutrition and hydration.    K. , AGNP  [facility] [phone](Monday-Friday 8 am - 5 pm) [phone] (after hours)  

## 2017-06-27 NOTE — Patient Instructions (Addendum)
Stop Seroquel   Monitor intake. Make sure she is eating and drinking appropriately   Consider assisted living for increase supervision.   Will start cipro 500 mg by mouth twice daily for urine Take with probiotic twice daily

## 2017-06-30 LAB — URINE CULTURE

## 2017-07-03 ENCOUNTER — Ambulatory Visit (INDEPENDENT_AMBULATORY_CARE_PROVIDER_SITE_OTHER): Payer: Medicare Other | Admitting: Internal Medicine

## 2017-07-03 ENCOUNTER — Encounter: Payer: Self-pay | Admitting: Internal Medicine

## 2017-07-03 VITALS — BP 132/60 | HR 58 | Temp 98.4°F | Wt 129.0 lb

## 2017-07-03 DIAGNOSIS — R410 Disorientation, unspecified: Secondary | ICD-10-CM | POA: Diagnosis not present

## 2017-07-03 DIAGNOSIS — F02818 Dementia in other diseases classified elsewhere, unspecified severity, with other behavioral disturbance: Secondary | ICD-10-CM

## 2017-07-03 DIAGNOSIS — F0281 Dementia in other diseases classified elsewhere with behavioral disturbance: Secondary | ICD-10-CM | POA: Diagnosis not present

## 2017-07-03 DIAGNOSIS — G301 Alzheimer's disease with late onset: Secondary | ICD-10-CM

## 2017-07-03 DIAGNOSIS — N3000 Acute cystitis without hematuria: Secondary | ICD-10-CM

## 2017-07-03 NOTE — Patient Instructions (Signed)
Monitor behavior, sleep patterns, twitching, confusion.   If improving when cipro complete, plan to come in Monday for repeat urine sample. We will then call with a plan from there about whether to start back on aricept,

## 2017-07-03 NOTE — Progress Notes (Addendum)
Location:  [facility] clinic Provider:  Stokes Rattigan L. Mariea Clonts, D.O., C.M.D.  Code Status: DNR Goals of Care:  Advanced Directives 07/03/2017  Does Patient Have a Medical Advance Directive? Yes  Type of Advance Directive [place]  Does patient want to make changes to medical advance directive? -  Copy of [place] in Chart? Yes  Would patient like information on creating a medical advance directive? -   Chief Complaint  Patient presents with  . Medical Management of Chronic Issues    6 week follow-up    HPI: Patient is a 83 y.o. female seen today for medical management of chronic diseases.    As soon as she came off namzaric, her daughter reports she knew her sister one day and not the next.  She was also on lorazepam and gabapentin and seemed to come around when those were stopped, but then she got a UTI.  Pt seemed to be having am panic attacks so ativan was prescribed for these.  She also was having back pain with sciatica and gabapentin was prescribed for it.  Family stopped both ativan and gabapentin before visit with Janett Billow due to increased confusion.  She had just begun using a CNA to remind her to take her meds and help her in the mornings.  She was found on the living room floor due to a possible fall.  She was very sleepy when Janett Billow saw her.  A urine sample was done for which culture grew out klebsiella and I advised cipro yesterday, but turns out she was already on the cipro from 7/20.  She had taken her nighttime pills in the morning once.  She'd been jerking last time and Janett Billow stopped the seroquel.  She has continued to have the jerky movements off the seroquel.  She is not sleeping well--is jerky for hours, then sleeps soundly early on.    Her daughter says pt is coming around since two doses of cipro.  She continues to sit here falling asleep.  They now say that the aricept alone was the best for her and her daughter wants that restarted.  She is  very restless.    Dr. Tonita Cong referred her to pain mgt instead of recommending surgery--referred to Dr. Nelva Bush.    Past Medical History:  Diagnosis Date  . Abnormal weight loss 05/28/2004  . Anxiety 01/17/2003  . Arthritis    "a little; qwhere" (5/25/20170  . Back pain 08/19/2005   with radiculopathy  . Back pain 05/07/2017  . Bradycardia, drug induced   . Cerebral atherosclerosis 09/24/1999  . Chest pain, atypical    cath 05/03/2016 for recurrent CP, mild nonobstructive dx. Mild to moderate R renal artery stenosis, patent L renal artery  . Dementia    "don't know kind or stage" (05/02/2016)  . Depression 09/06/2002  . Diverticulitis 09/28/2007  . Diverticulosis of colon 06/07/2003  . External hemorrhoids 04/12/2009  . Female climacteric state 03/04/2000  . Formed visual hallucinations    [name]?  Failed Keppra and Depakote  . GERD (gastroesophageal reflux disease)   . Hallucination, visual   . Hypertension   . Hypertension    cath 05/03/2016 Mild to moderate R renal artery stenosis, patent L renal artery  . Leg pain, left   . Macular degeneration   . Malaise and fatigue   . 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 Osteoarthrosis, localized   . Osteoporosis 05/28/2004  . Palpitations 11/16/2001  .  Paranoia ([place])   . Paroxysmal A-fib ([place])   . PSVT (paroxysmal supraventricular tachycardia) ([place]) 02/22/2002  . Pure hypercholesterolemia 12/20/1999  . TIA (transient ischemic attack) 2000s?  [name] Urinary incontinence 08/18/2007  . UTI (urinary tract infection)   . Vaginitis, atrophic   . Vertigo, peripheral 10/07/2000    Past Surgical History:  Procedure Laterality Date  . BRAIN SURGERY  2000   Neurocysticercosis ("parasite")  . CARDIAC CATHETERIZATION N/A 05/03/2016   Procedure: Left Heart Cath and Coronary Angiography;  Surgeon: Sherren Mocha, MD;  Location: [place] [facility];  Service: Cardiovascular;  Laterality: N/A;  . CATARACT EXTRACTION  2013  .  CATARACT EXTRACTION, BILATERAL     "not sure if both eyes; feel like it probably was"  . JOINT REPLACEMENT    . PERIPHERAL VASCULAR CATHETERIZATION N/A 05/03/2016   Procedure: Abdominal Aortogram;  Surgeon: Sherren Mocha, MD;  Location: [place] [facility];  Service: Cardiovascular;  Laterality: N/A;  . SHOULDER ARTHROSCOPY W/ ROTATOR CUFF REPAIR Right X 3  . TOTAL KNEE ARTHROPLASTY Left 1990  . VAGINAL HYSTERECTOMY  1960    Allergies  Allergen Reactions  . Bactrim [Sulfamethoxazole-Trimethoprim] Nausea Only  . Amiodarone Itching  . Amoxicillin Other (See Comments)    Unknown- ? Upset stomach  . Demerol [Meperidine] Other (See Comments)    Hallucinations  . Hydralazine Other (See Comments)    Tingling and chest pain   . Lisinopril Cough  . Zoloft [Sertraline Hcl] Other (See Comments)    Unknown  . Tape Rash and Other (See Comments)    Can tear the skin, also    Allergies as of 07/03/2017      Reactions   Bactrim [sulfamethoxazole-trimethoprim] Nausea Only   Amiodarone Itching   Amoxicillin Other (See Comments)   Unknown- ? Upset stomach   Demerol [meperidine] Other (See Comments)   Hallucinations   Hydralazine Other (See Comments)   Tingling and chest pain    Lisinopril Cough   Zoloft [sertraline Hcl] Other (See Comments)   Unknown   Tape Rash, Other (See Comments)   Can tear the skin, also      Medication List       Accurate as of 07/03/17  9:47 AM. Always use your most recent med list.          acetaminophen 325 MG tablet Commonly known as:  TYLENOL Take 650 mg by mouth every 6 (six) hours as needed for mild pain.   amLODipine 5 MG tablet Commonly known as:  NORVASC Take 1 tablet (5 mg total) by mouth daily.   apixaban 2.5 MG Tabs tablet Commonly known as:  ELIQUIS Take 1 tablet (2.5 mg total) by mouth 2 (two) times daily.   ciprofloxacin 500 MG tablet Commonly known as:  CIPRO Take 1 tablet (500 mg total) by mouth 2 (two) times daily.   docusate  sodium 100 MG capsule Commonly known as:  COLACE Take 100 mg by mouth 2 (two) times daily.   losartan 100 MG tablet Commonly known as:  COZAAR Take 1 tablet (100 mg total) by mouth daily.   metoprolol tartrate 25 MG tablet Commonly known as:  LOPRESSOR Take 1 tablet (25 mg total) by mouth 2 (two) times daily.   venlafaxine 37.5 MG tablet Commonly known as:  EFFEXOR Take 1 tablet (37.5 mg total) by mouth 2 (two) times daily.   Vitamin D3 2000 units Tabs Take 2,000 Units by mouth daily.       Review of Systems:  Review of Systems  Constitutional: Negative for chills, fever and malaise/fatigue.  Eyes: Positive for blurred vision.  Respiratory: Negative for cough and shortness of breath.   Cardiovascular: Negative for chest pain and palpitations.  Gastrointestinal: Negative for abdominal pain, blood in stool, constipation and melena.  Genitourinary: Negative for dysuria, frequency, hematuria and urgency.       Increased incontinence ongoing  Musculoskeletal: Positive for falls.  Skin: Negative for itching and rash.  Neurological: Negative for dizziness, loss of consciousness and weakness.       Unsteady gait   Psychiatric/Behavioral: Positive for memory loss. Negative for depression and hallucinations. The patient is nervous/anxious and has insomnia.     Health Maintenance  Topic Date Due  . INFLUENZA VACCINE  07/09/2017  . TETANUS/TDAP  09/08/2022  . DEXA SCAN  Completed  . PNA vac Low Risk Adult  Completed    Physical Exam: Vitals:   07/03/17 0925  BP: 132/60  Pulse: (!) 58  Temp: 98.4 F (36.9 C)  TempSrc: Oral  SpO2: 97%  Weight: 129 lb (58.5 kg)   Body mass index is 23.59 kg/m. Physical Exam  Constitutional: She appears well-developed and well-nourished. No distress.  Cardiovascular: Normal rate, regular rhythm, normal heart sounds and intact distal pulses.   Pulmonary/Chest: Effort normal and breath sounds normal. No respiratory distress.  Abdominal:  Soft. Bowel sounds are normal. She exhibits no distension. There is no tenderness.  Musculoskeletal: Normal range of motion. She exhibits no tenderness.  Neurological:  Lethargic, speech garbled, falling asleep easily; said she could not stick her tongue out, but did partially and appeared midline, remaining CNs intact, strength equal, gait unsteady and holding onto her daughter  Skin: Skin is warm and dry. Capillary refill takes less than 2 seconds.  Psychiatric: She has a normal mood and affect.    Labs reviewed: Basic Metabolic Panel:  Recent Labs  05/01/17 1129 05/03/17 0808 06/27/17 1208  NA 140 137 136  K 3.3* 3.0* 4.2  CL 105 103 103  CO2 21 26 19*  GLUCOSE 97 101* 104*  BUN 11 11 34*  CREATININE 1.05* 1.22* 1.23*  CALCIUM 9.0 9.1 9.0  TSH  --   --  5.35*   Liver Function Tests:  Recent Labs  09/19/16 1215 05/01/17 1129  AST 23 17  ALT 12 11  ALKPHOS 84 87  BILITOT 0.5 0.6  PROT 7.3 6.8  ALBUMIN 3.9 3.6    Recent Labs  05/01/17 1119  LIPASE 48  AMYLASE 46   No results for input(s): AMMONIA in the last 8760 hours. CBC:  Recent Labs  05/01/17 1129 05/03/17 0808 06/27/17 1208  WBC 7.6 5.2 6.4  NEUTROABS 5,928 3.7 4,480  HGB 11.4* 10.8* 12.0  HCT 34.6* 33.7* 36.3  MCV 89.6 91.6 88.3  PLT 240 238 251   Lipid Panel: No results for input(s): CHOL, HDL, LDLCALC, TRIG, CHOLHDL, LDLDIRECT in the last 8760 hours. Lab Results  Component Value Date   HGBA1C 6.2 (H) 05/02/2016    Procedures since last visit: No results found.  Assessment/Plan 1. Acute cystitis without hematuria - pt had negative UA and positive culture -had been having increased incontinence and delirium -her daughter reports some improvement after gabapentin and lorazepam stopped--I don't really see it -also there is said to be some improvement with abx, but pt remains very lethargic, twitching, sleep schedule altered, gait unsteady and having to hold onto her daughter, has  difficulty spitting out her words -due to ongoing symptoms,  will repeat urine Monday when hopefully symptoms are better  - Urinalysis with Reflex Microscopic; Future  2. Delirium - seems this is not yet cleared  - still on cipro, but off all psych meds except effexor now so continue this way to eliminate other factors that can affect cognition - Urinalysis with Reflex Microscopic; Future -if no better next week, check CT brain w/o contrast  3. Late onset Alzheimer's disease with behavioral disturbance -seems this is progressing w/ d/c'd namzaric, but she also has been on meds for back pain (gabapentin) and anxiety/panic attacks (lorazepam) due to these symptoms -reassess urine next week and find out from her daughter if symptoms improved--if they are, will start aricept 5mg  qhs -if not any better, will order CT brain w/o contrast  Labs/tests ordered:   Orders Placed This Encounter  Procedures  . Urinalysis with Reflex Microscopic    Standing Status:   Future    Standing Expiration Date:   07/24/2017    Next appt:  11/27/2017   Naresh Althaus L. Shayne Deerman, D.O. [facility] [address], [place] [address] Cell Phone (Mon-Fri 8am-5pm):  [phone] On Call:  [phone] & follow prompts after 5pm & weekends Office Phone:  [phone] Office Fax:  [phone]

## 2017-07-07 ENCOUNTER — Other Ambulatory Visit: Payer: Medicare Other

## 2017-07-09 ENCOUNTER — Inpatient Hospital Stay (HOSPITAL_COMMUNITY)
Admission: EM | Admit: 2017-07-09 | Discharge: 2017-07-18 | DRG: 308 | Disposition: A | Discharge status: Home Health | Payer: Medicare Other | Attending: Cardiovascular Disease | Admitting: Cardiovascular Disease

## 2017-07-09 ENCOUNTER — Encounter (HOSPITAL_COMMUNITY): Payer: Self-pay

## 2017-07-09 ENCOUNTER — Other Ambulatory Visit: Payer: Medicare Other

## 2017-07-09 ENCOUNTER — Ambulatory Visit (INDEPENDENT_AMBULATORY_CARE_PROVIDER_SITE_OTHER): Payer: Medicare Other | Admitting: Nurse Practitioner

## 2017-07-09 ENCOUNTER — Other Ambulatory Visit: Payer: Self-pay

## 2017-07-09 ENCOUNTER — Encounter: Payer: Self-pay | Admitting: Nurse Practitioner

## 2017-07-09 VITALS — BP 148/82 | HR 88 | Temp 97.4°F | Resp 17

## 2017-07-09 DIAGNOSIS — I4891 Unspecified atrial fibrillation: Secondary | ICD-10-CM | POA: Diagnosis not present

## 2017-07-09 DIAGNOSIS — I130 Hypertensive heart and chronic kidney disease with heart failure and stage 1 through stage 4 chronic kidney disease, or unspecified chronic kidney disease: Secondary | ICD-10-CM | POA: Diagnosis not present

## 2017-07-09 DIAGNOSIS — Z7901 Long term (current) use of anticoagulants: Secondary | ICD-10-CM | POA: Diagnosis not present

## 2017-07-09 DIAGNOSIS — R079 Chest pain, unspecified: Secondary | ICD-10-CM | POA: Diagnosis not present

## 2017-07-09 DIAGNOSIS — K219 Gastro-esophageal reflux disease without esophagitis: Secondary | ICD-10-CM | POA: Diagnosis present

## 2017-07-09 DIAGNOSIS — Z90710 Acquired absence of both cervix and uterus: Secondary | ICD-10-CM

## 2017-07-09 DIAGNOSIS — R55 Syncope and collapse: Secondary | ICD-10-CM | POA: Diagnosis not present

## 2017-07-09 DIAGNOSIS — I509 Heart failure, unspecified: Secondary | ICD-10-CM | POA: Diagnosis not present

## 2017-07-09 DIAGNOSIS — Z79899 Other long term (current) drug therapy: Secondary | ICD-10-CM

## 2017-07-09 DIAGNOSIS — I5033 Acute on chronic diastolic (congestive) heart failure: Secondary | ICD-10-CM | POA: Diagnosis not present

## 2017-07-09 DIAGNOSIS — I481 Persistent atrial fibrillation: Secondary | ICD-10-CM | POA: Diagnosis not present

## 2017-07-09 DIAGNOSIS — H3530 Unspecified macular degeneration: Secondary | ICD-10-CM | POA: Diagnosis present

## 2017-07-09 DIAGNOSIS — R0602 Shortness of breath: Secondary | ICD-10-CM

## 2017-07-09 DIAGNOSIS — I119 Hypertensive heart disease without heart failure: Secondary | ICD-10-CM | POA: Diagnosis present

## 2017-07-09 DIAGNOSIS — R404 Transient alteration of awareness: Secondary | ICD-10-CM | POA: Diagnosis not present

## 2017-07-09 DIAGNOSIS — Z8673 Personal history of transient ischemic attack (TIA), and cerebral infarction without residual deficits: Secondary | ICD-10-CM | POA: Diagnosis not present

## 2017-07-09 DIAGNOSIS — I4892 Unspecified atrial flutter: Secondary | ICD-10-CM | POA: Diagnosis not present

## 2017-07-09 DIAGNOSIS — Z87440 Personal history of urinary (tract) infections: Secondary | ICD-10-CM | POA: Diagnosis not present

## 2017-07-09 DIAGNOSIS — R4182 Altered mental status, unspecified: Secondary | ICD-10-CM | POA: Diagnosis not present

## 2017-07-09 DIAGNOSIS — R0789 Other chest pain: Secondary | ICD-10-CM | POA: Diagnosis not present

## 2017-07-09 DIAGNOSIS — I10 Essential (primary) hypertension: Secondary | ICD-10-CM | POA: Diagnosis not present

## 2017-07-09 DIAGNOSIS — R7989 Other specified abnormal findings of blood chemistry: Secondary | ICD-10-CM | POA: Diagnosis not present

## 2017-07-09 DIAGNOSIS — I272 Pulmonary hypertension, unspecified: Secondary | ICD-10-CM | POA: Diagnosis present

## 2017-07-09 DIAGNOSIS — F0150 Vascular dementia without behavioral disturbance: Secondary | ICD-10-CM | POA: Diagnosis not present

## 2017-07-09 DIAGNOSIS — Z96652 Presence of left artificial knee joint: Secondary | ICD-10-CM | POA: Diagnosis present

## 2017-07-09 DIAGNOSIS — R42 Dizziness and giddiness: Secondary | ICD-10-CM

## 2017-07-09 DIAGNOSIS — I2510 Atherosclerotic heart disease of native coronary artery without angina pectoris: Secondary | ICD-10-CM | POA: Diagnosis present

## 2017-07-09 DIAGNOSIS — E871 Hypo-osmolality and hyponatremia: Secondary | ICD-10-CM | POA: Diagnosis not present

## 2017-07-09 DIAGNOSIS — N183 Chronic kidney disease, stage 3 (moderate): Secondary | ICD-10-CM | POA: Diagnosis present

## 2017-07-09 DIAGNOSIS — R410 Disorientation, unspecified: Secondary | ICD-10-CM

## 2017-07-09 DIAGNOSIS — I959 Hypotension, unspecified: Secondary | ICD-10-CM | POA: Diagnosis not present

## 2017-07-09 DIAGNOSIS — E876 Hypokalemia: Secondary | ICD-10-CM | POA: Diagnosis present

## 2017-07-09 DIAGNOSIS — N3000 Acute cystitis without hematuria: Secondary | ICD-10-CM

## 2017-07-09 DIAGNOSIS — Z8249 Family history of ischemic heart disease and other diseases of the circulatory system: Secondary | ICD-10-CM

## 2017-07-09 DIAGNOSIS — R4701 Aphasia: Secondary | ICD-10-CM | POA: Diagnosis present

## 2017-07-09 DIAGNOSIS — R000 Tachycardia, unspecified: Secondary | ICD-10-CM | POA: Diagnosis not present

## 2017-07-09 DIAGNOSIS — I340 Nonrheumatic mitral (valve) insufficiency: Secondary | ICD-10-CM | POA: Diagnosis not present

## 2017-07-09 DIAGNOSIS — M810 Age-related osteoporosis without current pathological fracture: Secondary | ICD-10-CM | POA: Diagnosis present

## 2017-07-09 DIAGNOSIS — I495 Sick sinus syndrome: Secondary | ICD-10-CM | POA: Diagnosis not present

## 2017-07-09 DIAGNOSIS — M1990 Unspecified osteoarthritis, unspecified site: Secondary | ICD-10-CM | POA: Diagnosis present

## 2017-07-09 DIAGNOSIS — F0390 Unspecified dementia without behavioral disturbance: Secondary | ICD-10-CM | POA: Diagnosis present

## 2017-07-09 DIAGNOSIS — I351 Nonrheumatic aortic (valve) insufficiency: Secondary | ICD-10-CM | POA: Diagnosis not present

## 2017-07-09 DIAGNOSIS — I4819 Other persistent atrial fibrillation: Secondary | ICD-10-CM | POA: Diagnosis present

## 2017-07-09 DIAGNOSIS — Z9289 Personal history of other medical treatment: Secondary | ICD-10-CM

## 2017-07-09 DIAGNOSIS — N390 Urinary tract infection, site not specified: Secondary | ICD-10-CM

## 2017-07-09 HISTORY — DX: Hypertensive heart disease without heart failure: I11.9

## 2017-07-09 HISTORY — DX: Long term (current) use of anticoagulants: Z79.01

## 2017-07-09 LAB — URINALYSIS, ROUTINE W REFLEX MICROSCOPIC
BILIRUBIN URINE: NEGATIVE
GLUCOSE, UA: NEGATIVE mg/dL
Hgb urine dipstick: NEGATIVE
KETONES UR: NEGATIVE mg/dL
Leukocytes, UA: NEGATIVE
Nitrite: NEGATIVE
PROTEIN: NEGATIVE mg/dL
SPECIFIC GRAVITY, URINE: 1.008 (ref 1.005–1.030)
pH: 7 (ref 5.0–8.0)

## 2017-07-09 LAB — CBC WITH DIFFERENTIAL/PLATELET
BASOS PCT: 0 %
Basophils Absolute: 0 10*3/uL (ref 0.0–0.1)
EOS PCT: 2 %
Eosinophils Absolute: 0.2 10*3/uL (ref 0.0–0.7)
HCT: 34.2 % — ABNORMAL LOW (ref 36.0–46.0)
Hemoglobin: 11.4 g/dL — ABNORMAL LOW (ref 12.0–15.0)
LYMPHS ABS: 1.8 10*3/uL (ref 0.7–4.0)
Lymphocytes Relative: 18 %
MCH: 28.6 pg (ref 26.0–34.0)
MCHC: 33.3 g/dL (ref 30.0–36.0)
MCV: 85.9 fL (ref 78.0–100.0)
MONOS PCT: 10 %
Monocytes Absolute: 1 10*3/uL (ref 0.1–1.0)
Neutro Abs: 6.8 10*3/uL (ref 1.7–7.7)
Neutrophils Relative %: 70 %
PLATELETS: 242 10*3/uL (ref 150–400)
RBC: 3.98 MIL/uL (ref 3.87–5.11)
RDW: 13.8 % (ref 11.5–15.5)
WBC: 9.8 10*3/uL (ref 4.0–10.5)

## 2017-07-09 LAB — I-STAT CHEM 8, ED
BUN: 38 mg/dL — AB (ref 6–20)
CHLORIDE: 96 mmol/L — AB (ref 101–111)
CREATININE: 1.2 mg/dL — AB (ref 0.44–1.00)
Calcium, Ion: 0.98 mmol/L — ABNORMAL LOW (ref 1.15–1.40)
GLUCOSE: 133 mg/dL — AB (ref 65–99)
HCT: 41 % (ref 36.0–46.0)
Hemoglobin: 13.9 g/dL (ref 12.0–15.0)
POTASSIUM: 3.9 mmol/L (ref 3.5–5.1)
Sodium: 131 mmol/L — ABNORMAL LOW (ref 135–145)
TCO2: 24 mmol/L (ref 0–100)

## 2017-07-09 LAB — I-STAT TROPONIN, ED: TROPONIN I, POC: 0.02 ng/mL (ref 0.00–0.08)

## 2017-07-09 LAB — GLUCOSE, POCT (MANUAL RESULT ENTRY): POC GLUCOSE: 161 mg/dL — AB (ref 70–99)

## 2017-07-09 MED ORDER — DOCUSATE SODIUM 100 MG PO CAPS
100.0000 mg | ORAL_CAPSULE | Freq: Two times a day (BID) | ORAL | Status: DC
  Administered 2017-07-09 – 2017-07-18 (×17): 100 mg via ORAL
  Filled 2017-07-09 (×17): qty 1

## 2017-07-09 MED ORDER — METOPROLOL TARTRATE 25 MG PO TABS
25.0000 mg | ORAL_TABLET | Freq: Two times a day (BID) | ORAL | Status: DC
  Administered 2017-07-09: 25 mg via ORAL
  Filled 2017-07-09: qty 1

## 2017-07-09 MED ORDER — VITAMIN D 1000 UNITS PO TABS
2000.0000 [IU] | ORAL_TABLET | Freq: Every day | ORAL | Status: DC
  Administered 2017-07-10 – 2017-07-18 (×8): 2000 [IU] via ORAL
  Filled 2017-07-09 (×9): qty 2

## 2017-07-09 MED ORDER — DILTIAZEM HCL 100 MG IV SOLR
5.0000 mg/h | INTRAVENOUS | Status: DC
  Administered 2017-07-09: 5 mg/h via INTRAVENOUS
  Administered 2017-07-09 (×2): 15 mg/h via INTRAVENOUS
  Administered 2017-07-10: 12.5 mg/h via INTRAVENOUS
  Administered 2017-07-10: 15 mg/h via INTRAVENOUS
  Administered 2017-07-11: 12.5 mg/h via INTRAVENOUS
  Administered 2017-07-11: 15 mg/h via INTRAVENOUS
  Administered 2017-07-11: 12.5 mg/h via INTRAVENOUS
  Administered 2017-07-12 (×2): 15 mg/h via INTRAVENOUS
  Filled 2017-07-09 (×10): qty 100

## 2017-07-09 MED ORDER — METOPROLOL TARTRATE 5 MG/5ML IV SOLN
5.0000 mg | Freq: Once | INTRAVENOUS | Status: AC
  Administered 2017-07-09: 5 mg via INTRAVENOUS
  Filled 2017-07-09: qty 5

## 2017-07-09 MED ORDER — LOSARTAN POTASSIUM 50 MG PO TABS
100.0000 mg | ORAL_TABLET | Freq: Every day | ORAL | Status: DC
  Administered 2017-07-10 – 2017-07-18 (×7): 100 mg via ORAL
  Filled 2017-07-09 (×9): qty 2

## 2017-07-09 MED ORDER — NITROGLYCERIN 0.4 MG SL SUBL: 0.4000 mg | SUBLINGUAL_TABLET | SUBLINGUAL | Status: DC | PRN

## 2017-07-09 MED ORDER — ACETAMINOPHEN 325 MG PO TABS: 650.0000 mg | ORAL_TABLET | Freq: Four times a day (QID) | ORAL | Status: DC | PRN

## 2017-07-09 MED ORDER — DILTIAZEM HCL 25 MG/5ML IV SOLN
10.0000 mg | Freq: Once | INTRAVENOUS | Status: AC
  Administered 2017-07-09: 10 mg via INTRAVENOUS
  Filled 2017-07-09: qty 5

## 2017-07-09 MED ORDER — APIXABAN 2.5 MG PO TABS
2.5000 mg | ORAL_TABLET | Freq: Two times a day (BID) | ORAL | Status: DC
  Administered 2017-07-09 – 2017-07-18 (×17): 2.5 mg via ORAL
  Filled 2017-07-09 (×17): qty 1

## 2017-07-09 MED ORDER — VENLAFAXINE HCL 37.5 MG PO TABS
37.5000 mg | ORAL_TABLET | Freq: Two times a day (BID) | ORAL | Status: DC
  Administered 2017-07-09 – 2017-07-18 (×16): 37.5 mg via ORAL
  Filled 2017-07-09 (×20): qty 1

## 2017-07-09 MED ORDER — ONDANSETRON HCL 4 MG/2ML IJ SOLN
4.0000 mg | Freq: Four times a day (QID) | INTRAMUSCULAR | Status: DC | PRN
Start: 2017-07-09 — End: 2017-07-18

## 2017-07-09 NOTE — ED Provider Notes (Addendum)
[facility] DEPT Provider Note   CSN: [id number] Arrival date & time: 07/09/17  1249     History   Chief Complaint Chief Complaint  Patient presents with  . Atrial Fibrillation    HPI Brittney Gutierrez is a 83 y.o. female.  HPI level 5 caveat dementia history is obtained from paramedics and from patient's daughter who accompanies her and from old records. Patient went to her primary care physician's office this morning in order to get her urinalysis rechecked as she was recently treated for urinary tract infection. Patient had near syncopal event while at her doctor's office. EMS was called and brought patient here . patient noted to be in atrial fibrillation with rapid ventricular response while in route here. Patient is asymptomatic as I interview her and examine her. She denies any chest pain denies shortness of breath. No treatment prior to coming here  Past Medical History:  Diagnosis Date  . Abnormal weight loss 05/28/2004  . Anxiety 01/17/2003  . Back pain 08/19/2005   with radiculopathy  . Bradycardia, drug induced    Diltiazem  . Cerebral atherosclerosis 09/24/1999  . Chest pain, atypical    cath 05/03/2016 for recurrent CP, mild nonobstructive dx. Mild to moderate R renal artery stenosis, patent L renal artery  . Chronic anticoagulation    Eliquis  . Dementia    "don't know kind or stage" (05/02/2016)  . Depression 09/06/2002  . Diverticulitis 09/28/2007  . Diverticulosis of colon 06/07/2003  . External hemorrhoids 04/12/2009  . Female climacteric state 03/04/2000  . 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 GERD (gastroesophageal reflux disease)   . Hypertension    cath 05/03/2016 Mild to moderate R renal artery stenosis, patent L renal artery  . Leg pain, left   . Macular degeneration   . Malaise and fatigue   . Neurocysticercosis    Craniotomy at [facility] in early 2000s  . Osteoarthrosis, localized   . Osteoporosis  05/28/2004  . Palpitations 11/16/2001  . Paranoia ([place])   . Paroxysmal A-fib ([place])   . TIA (transient ischemic attack) 2000s?  [name] Urinary incontinence 08/18/2007  . UTI (urinary tract infection)   . Vaginitis, atrophic   . Vertigo, peripheral 10/07/2000    Patient Active Problem List   Diagnosis Date Noted  . Chronic anticoagulation   . Polypharmacy 05/03/2017  . DDD (degenerative disc disease), lumbar 03/24/2017  . Left hip pain 03/24/2017  . Current use of long term anticoagulation 06/16/2016  . Essential hypertension, malignant   . Chest pain 05/02/2016  . GERD (gastroesophageal reflux disease) 04/18/2016  . Depression 04/18/2016  . SSS (sick sinus syndrome) ([place]) 04/11/2016  . Atrial fibrillation with rapid ventricular response ([place])   . Hypertensive urgency 02/29/2016  . Sinus bradycardia seen on cardiac monitor 01/14/2016  . Aortic insufficiency 01/14/2016  . PAH (pulmonary artery hypertension) ([place]) 01/14/2016  . Essential hypertension 01/14/2016  . Atrial fibrillation with RVR ([place]) 12/27/2015  . Dementia 12/27/2015  . Hypokalemia 12/27/2015  . Demand ischemia [place])     Past Surgical History:  Procedure Laterality Date  . BRAIN SURGERY  2000   Neurocysticercosis ("parasite")  . CARDIAC CATHETERIZATION N/A 05/03/2016   Procedure: Left Heart Cath and Coronary Angiography;  Surgeon: Sherren Mocha, MD;  Location: [facility];  Service: Cardiovascular;  Laterality: N/A;  . CATARACT EXTRACTION  2013  . CATARACT EXTRACTION, BILATERAL     "not sure if both eyes; feel like  it probably was"  . JOINT REPLACEMENT    . PERIPHERAL VASCULAR CATHETERIZATION N/A 05/03/2016   Procedure: Abdominal Aortogram;  Surgeon: Sherren Mocha, MD;  Location: [facility];  Service: Cardiovascular;  Laterality: N/A;  . SHOULDER ARTHROSCOPY W/ ROTATOR CUFF REPAIR Right X 3  . TOTAL KNEE ARTHROPLASTY Left 1990  . VAGINAL HYSTERECTOMY  1960    OB History    No data available        Home Medications    Prior to Admission medications   Medication Sig Start Date End Date Taking? Authorizing Provider  acetaminophen (TYLENOL) 325 MG tablet Take 650 mg by mouth every 6 (six) hours as needed for mild pain.    [provider]  amLODipine (NORVASC) 5 MG tablet Take 1 tablet (5 mg total) by mouth daily. 05/01/17   Croitoru, Mihai, MD  apixaban (ELIQUIS) 2.5 MG TABS tablet Take 1 tablet (2.5 mg total) by mouth 2 (two) times daily. 05/01/17   Croitoru, Mihai, MD  Cholecalciferol (VITAMIN D3) 2000 units TABS Take 2,000 Units by mouth daily.     [provider]  docusate sodium (COLACE) 100 MG capsule Take 100 mg by mouth 2 (two) times daily.     [provider]  losartan (COZAAR) 100 MG tablet Take 1 tablet (100 mg total) by mouth daily. 05/01/17   Croitoru, Mihai, MD  metoprolol tartrate (LOPRESSOR) 25 MG tablet Take 1 tablet (25 mg total) by mouth 2 (two) times daily. 05/01/17   Croitoru, Mihai, MD  venlafaxine (EFFEXOR) 37.5 MG tablet Take 1 tablet (37.5 mg total) by mouth 2 (two) times daily. 05/01/17   Lauree Chandler, NP    Family History Family History  Problem Relation Age of Onset  . Polycystic kidney disease Mother   . Heart attack Father   . Hypertension Sister   . Dementia Neg Hx     Social History Social History  Substance Use Topics  . Smoking status: Never Smoker  . Smokeless tobacco: Never Used  . Alcohol use No     Allergies   Bactrim [sulfamethoxazole-trimethoprim]; Amiodarone; Amoxicillin; Demerol [meperidine]; Hydralazine; Lisinopril; Zoloft [sertraline hcl]; and Tape   Review of Systems Review of Systems  Unable to perform ROS: Dementia     Physical Exam Updated Vital Signs BP (!) 146/78   Pulse (!) 138   Temp (!) 97.4 F (36.3 C) (Oral)   Resp 20   Ht 5\' 2"  (1.575 m)   Wt 58.5 kg (129 lb)   SpO2 96%   BMI 23.59 kg/m   Physical Exam  Constitutional: She appears well-developed and well-nourished.   HENT:  Head: Normocephalic and atraumatic.  Eyes: Pupils are equal, round, and reactive to light. Conjunctivae are normal.  Neck: Neck supple. No tracheal deviation present. No thyromegaly present.  Cardiovascular:  No murmur heard. Tachycardic irregularly irregular  Pulmonary/Chest: Effort normal and breath sounds normal.  Abdominal: Soft. Bowel sounds are normal. She exhibits no distension. There is no tenderness.  Musculoskeletal: Normal range of motion. She exhibits no edema or tenderness.  Neurological: She is alert. Coordination normal.  Skin: Skin is warm and dry. No rash noted.  Psychiatric: She has a normal mood and affect.  Nursing note and vitals reviewed.    ED Treatments / Results  Labs (all labs ordered are listed, but only abnormal results are displayed) Labs Reviewed  CBC WITH DIFFERENTIAL/PLATELET - Abnormal; Notable for the following:       Result Value   Hemoglobin  11.4 (*)    HCT 34.2 (*)    All other components within normal limits  I-STAT CHEM 8, ED - Abnormal; Notable for the following:    Sodium 131 (*)    Chloride 96 (*)    BUN 38 (*)    Creatinine, Ser 1.20 (*)    Glucose, Bld 133 (*)    Calcium, Ion 0.98 (*)    All other components within normal limits  URINALYSIS, ROUTINE W REFLEX MICROSCOPIC  I-STAT TROPONIN, ED    EKG  EKG Interpretation  Date/Time:  Wednesday July 09 2017 12:55:10 EDT Ventricular Rate:  170 PR Interval:    QRS Duration: 111 QT Interval:  276 QTC Calculation: 465 R Axis:   -53 Text Interpretation:  Atrial fibrillation with rapid V-rate Abnormal R-wave progression, early transition LVH with IVCD, LAD and secondary repol abnrm ST depression, probably rate related Confirmed by Orlie Dakin [phone]) on 07/09/2017 1:11:54 PM     ED ECG REPORT   Date: 07/09/2017 1409 pm  Rate: 180  Rhythm: atrial fibrillation  QRS Axis: left  Intervals: QT prolonged  ST/T Wave abnormalities: ST depressions laterally  Conduction  Disutrbances:left anterior fascicular block  Narrative Interpretation:   Old EKG Reviewed: unchanged unchanged from EKG from earlier today I have personally reviewed the EKG tracing and agree with the computerized printout as noted.  Radiology No results found.  Procedures Procedures (including critical care time)  Medications Ordered in ED Medications  metoprolol tartrate (LOPRESSOR) injection 5 mg (not administered)  metoprolol tartrate (LOPRESSOR) injection 5 mg (5 mg Intravenous Given 07/09/17 1342)    Results for orders placed or performed during the hospital encounter of 07/09/17  CBC with Differential/Platelet  Result Value Ref Range   WBC 9.8 4.0 - 10.5 K/uL   RBC 3.98 3.87 - 5.11 MIL/uL   Hemoglobin 11.4 (L) 12.0 - 15.0 g/dL   HCT 34.2 (L) 36.0 - 46.0 %   MCV 85.9 78.0 - 100.0 fL   MCH 28.6 26.0 - 34.0 pg   MCHC 33.3 30.0 - 36.0 g/dL   RDW 13.8 11.5 - 15.5 %   Platelets 242 150 - 400 K/uL   Neutrophils Relative % 70 %   Lymphocytes Relative 18 %   Monocytes Relative 10 %   Eosinophils Relative 2 %   Basophils Relative 0 %   Neutro Abs 6.8 1.7 - 7.7 K/uL   Lymphs Abs 1.8 0.7 - 4.0 K/uL   Monocytes Absolute 1.0 0.1 - 1.0 K/uL   Eosinophils Absolute 0.2 0.0 - 0.7 K/uL   Basophils Absolute 0.0 0.0 - 0.1 K/uL  I-Stat Troponin, ED (not at [facility])  Result Value Ref Range   Troponin i, poc 0.02 0.00 - 0.08 ng/mL   Comment 3          I-Stat Chem 8, ED  Result Value Ref Range   Sodium 131 (L) 135 - 145 mmol/L   Potassium 3.9 3.5 - 5.1 mmol/L   Chloride 96 (L) 101 - 111 mmol/L   BUN 38 (H) 6 - 20 mg/dL   Creatinine, Ser 1.20 (H) 0.44 - 1.00 mg/dL   Glucose, Bld 133 (H) 65 - 99 mg/dL   Calcium, Ion 0.98 (L) 1.15 - 1.40 mmol/L   TCO2 24 0 - 100 mmol/L   Hemoglobin 13.9 12.0 - 15.0 g/dL   HCT 41.0 36.0 - 46.0 %   No results found. Initial Impression / Assessment and Plan / ED Course  I have reviewed the triage  vital signs and the nursing notes.  Pertinent labs &  imaging results that were available during my care of the patient were reviewed by me and considered in my medical decision making (see chart for details).     Vagal maneuvers attempted, without change in rhythm or rate. Her daughter reports to me questionable compliance with medications including anticoagulants as she has found pills on floor of patient's swelling. Therefore we will not cardiovert patient. It's unclear how long she has been in atrial fibrillation. She presently does not feel palpitations. Lopressor IV ordered by me. I've consulted cardiology service to evaluate patient in ED Nursing unable to obtain adequate blood work.  I performed phlebotomy from patient's right femoral vein using 21-gauge needle and syringe.No resulting hematoma.    2:15 PM patient's rate does not change after 2 doses of Lopressor intravenously. Noted to be in atrial fibrillation with rapid ventricular response with ventricular rate approximately 1 80 bpm Intravenous Cardizem ordered. She complains of vague left-sided chest discomfort presently  2:35 PM heart rate has decreased to approximately 135 bpm after treatment with intravenous Cardizem bolus. Cardiology service Mr. Rosalyn Gess, [place] arrived to evaluate patient and started patient on intravenous Cardizem drip. And will arrange for overnight stay Final Clinical Impressions(s) / ED Diagnoses  Diagnosis #1 atrial fibrillation with rapid ventricular response #2 chest pain Final diagnoses:  Chronic anticoagulation  #3 near syncope CRITICAL CARE Performed by: Orlie Dakin Total critical care time: 30 minutes Critical care time was exclusive of separately billable procedures and treating other patients. Critical care was necessary to treat or prevent imminent or life-threatening deterioration. Critical care was time spent personally by me on the following activities: development of treatment plan with patient and/or surrogate as well as nursing, discussions  with consultants, evaluation of patient's response to treatment, examination of patient, obtaining history from patient or surrogate, ordering and performing treatments and interventions, ordering and review of laboratory studies, ordering and review of radiographic studies, pulse oximetry and re-evaluation of patient's condition. New Prescriptions New Prescriptions   No medications on file     Orlie Dakin, MD 07/09/17 [place], MD 07/09/17 1441

## 2017-07-09 NOTE — ED Triage Notes (Signed)
Pt had near syncope episode at the doctors office and then was found to be in a-fib rate between 140 and 190. Pt oriented at baseline.

## 2017-07-09 NOTE — ED Notes (Signed)
Pt placed on bedpan

## 2017-07-09 NOTE — H&P (Addendum)
Patient ID: Lokelani Lutes MRN: [id number], DOB/AGE: 81-Jul-1928   Admit date: 07/09/2017   Primary Physician: Gayland Curry, DO Primary Cardiologist: Dr Sallyanne Kuster  HPI:  83 y/o female, resident of [facility] with a history of PAF and SSS. She is on Eliquis-CHADS VASc=6. She was unable to tolerate beta blocker and diltiazem together in the past secondary to bradycardia. She was unable to tolerate Amiodarone previously secondary to itching. Myoview was low risk Sept 2016. Echo march 2017 showed an EF of 50-55% moderate LVH. LOV was 05/02/17 and she was in NSR 62 with occasional PVC.   Last week she saw her PCP for UTI. She was in the office today for f/u. She was in the waiting room and had a glass of water and suddenly became near syncopal. She was brought to an exam room and her HR was > 150. She was sent to [facility] via EMS. In the ED her HR was 170-AF with RVR. Her rate is better after a dose of IV Diltiazem. She denies chest pain and is unaware she is in AF. Her daughter tells me that her mother has not been taking her medications correctly. They have found pills and they think she was taking the wrong pills at the wrong time.    Problem List: Past Medical History:  Diagnosis Date  . Abnormal weight loss 05/28/2004  . Anxiety 01/17/2003  . Back pain 08/19/2005   with radiculopathy  . Bradycardia, drug induced    Diltiazem in the setting of additional BB  . Cerebral atherosclerosis 09/24/1999  . Chest pain, atypical 04/2016   minor CAD at cath   . Chronic anticoagulation    Eliquis  . Dementia    "don't know kind or stage" (05/02/2016)  . Depression 09/06/2002  . Diverticulitis 09/28/2007  . External hemorrhoids 04/12/2009  . Female climacteric state 03/04/2000  . 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 GERD (gastroesophageal reflux disease)   . Hypertensive cardiovascular disease    moderate LVH on echo march 2017   . Macular degeneration   . Malaise and fatigue   . 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 Osteoarthrosis, localized   . Osteoporosis 05/28/2004  . Paranoia ([place])   . Paroxysmal A-fib ([place])   . TIA (transient ischemic attack) 2000s?  [name] Urinary incontinence 08/18/2007  . UTI (urinary tract infection)   . Vaginitis, atrophic   . Vertigo, peripheral 10/07/2000    Past Surgical History:  Procedure Laterality Date  . BRAIN SURGERY  2000   Neurocysticercosis ("parasite")  . CARDIAC CATHETERIZATION N/A 05/03/2016   Procedure: Left Heart Cath and Coronary Angiography;  Surgeon: Sherren Mocha, MD;  Location: [place] [facility];  Service: Cardiovascular;  Laterality: N/A;  . CATARACT EXTRACTION  2013  . CATARACT EXTRACTION, BILATERAL     "not sure if both eyes; feel like it probably was"  . JOINT REPLACEMENT    . PERIPHERAL VASCULAR CATHETERIZATION N/A 05/03/2016   Procedure: Abdominal Aortogram;  Surgeon: Sherren Mocha, MD;  Location: [place] [facility];  Service: Cardiovascular;  Laterality: N/A;  . SHOULDER ARTHROSCOPY W/ ROTATOR CUFF REPAIR Right X 3  . TOTAL KNEE ARTHROPLASTY Left 1990  . VAGINAL HYSTERECTOMY  1960     Allergies:  Allergies  Allergen Reactions  . Bactrim [Sulfamethoxazole-Trimethoprim] Nausea Only  . Amiodarone Itching  . Amoxicillin Other (See Comments)    Unknown- ?  Upset stomach  . Demerol [Meperidine] Other (See Comments)    Hallucinations  . Hydralazine Other (See Comments)    Tingling and chest pain   . Lisinopril Cough  . Zoloft [Sertraline Hcl] Other (See Comments)    Unknown  . Tape Rash and Other (See Comments)    Can tear the skin, also     Home Medications  Past Medical History:  Diagnosis Date  . Abnormal weight loss 05/28/2004  . Anxiety 01/17/2003  . Back pain 08/19/2005   with radiculopathy  . Bradycardia, drug induced    Diltiazem  . Cerebral atherosclerosis 09/24/1999  . Chest pain, atypical 04/2016   minor  CAD at cath   . Chronic anticoagulation    Eliquis  . Dementia    "don't know kind or stage" (05/02/2016)  . Depression 09/06/2002  . Diverticulitis 09/28/2007  . External hemorrhoids 04/12/2009  . Female climacteric state 03/04/2000  . 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 GERD (gastroesophageal reflux disease)   . Hypertensive cardiovascular disease    moderate LVH on echo march 2017  . Macular degeneration   . Malaise and fatigue   . Neurocysticercosis    Craniotomy at [facility] in early 2000s  . Osteoarthrosis, localized   . Osteoporosis 05/28/2004  . Paranoia ([place])   . Paroxysmal A-fib ([place])   . TIA (transient ischemic attack) 2000s?  [name] Urinary incontinence 08/18/2007  . UTI (urinary tract infection)   . Vaginitis, atrophic   . Vertigo, peripheral 10/07/2000    Prior to Admission medications   Medication Sig Start Date End Date Taking? Authorizing Provider  acetaminophen (TYLENOL) 325 MG tablet Take 650 mg by mouth every 6 (six) hours as needed for mild pain.    [provider]  amLODipine (NORVASC) 5 MG tablet Take 1 tablet (5 mg total) by mouth daily. 05/01/17   Croitoru, Mihai, MD  apixaban (ELIQUIS) 2.5 MG TABS tablet Take 1 tablet (2.5 mg total) by mouth 2 (two) times daily. 05/01/17   Croitoru, Mihai, MD  Cholecalciferol (VITAMIN D3) 2000 units TABS Take 2,000 Units by mouth daily.     [provider]  docusate sodium (COLACE) 100 MG capsule Take 100 mg by mouth 2 (two) times daily.     [provider]  losartan (COZAAR) 100 MG tablet Take 1 tablet (100 mg total) by mouth daily. 05/01/17   Croitoru, Mihai, MD  metoprolol tartrate (LOPRESSOR) 25 MG tablet Take 1 tablet (25 mg total) by mouth 2 (two) times daily. 05/01/17   Croitoru, Mihai, MD  venlafaxine (EFFEXOR) 37.5 MG tablet Take 1 tablet (37.5 mg total) by mouth 2 (two) times daily. 05/01/17   Lauree Chandler, NP     Family History  Problem  Relation Age of Onset  . Polycystic kidney disease Mother   . Heart attack Father   . Hypertension Sister   . Dementia Neg Hx      Social History   Social History  . Marital status: Widowed    Spouse name: N/A  . Number of children: N/A  . Years of education: N/A   Occupational History  . Not on file.   Social History Main Topics  . Smoking status: Never Smoker  . Smokeless tobacco: Never Used  . Alcohol use No  . Drug use: No  . Sexual activity: No   Other Topics Concern  . Not on file   Social History Narrative  DIET: None      DO YOU DRINK/EAT THINGS WITH CAFFEINE: yes      MARITAL STATUS: widow      WHAT YEAR WERE YOU MARRIED: 1948      DO YOU LIVE IN A HOUSE, APARTMENT, ASSISTED LIVING, CONDO TRAILER ETC.: Independent Living      IS IT ONE OR MORE STORIES: 1 story      HOW MANY PERSONS LIVE IN YOUR HOME: 1      DO YOU HAVE PETS IN YOUR HOME: no      CURRENT OR PAST PROFESSION: Book Keeper      DO YOU EXERCISE: no      WHAT TYPE AND HOW OFTEN:     Review of Systems: General: negative for chills, fever, night sweats or weight changes.  Cardiovascular: negative for chest pain, dyspnea on exertion, edema, orthopnea, palpitations, paroxysmal nocturnal dyspnea or shortness of breath HEENT: negative for any visual disturbances, blindness, glaucoma Dermatological: negative for rash Respiratory: negative for cough, hemoptysis, or wheezing Urologic: negative for hematuria or dysuria Abdominal: negative for nausea, vomiting, diarrhea, bright red blood per rectum, melena, or hematemesis Neurologic: negative for visual changes Musculoskeletal: negative for back pain, joint pain, or swelling Psych: cooperative and appropriate All other systems reviewed and are otherwise negative except as noted above.  Physical Exam: Blood pressure 119/79, pulse (!) 56, temperature (!) 97.4 F (36.3 C), temperature source Oral, resp. rate 16, height 5\' 2"  (1.575 m), weight  129 lb (58.5 kg), SpO2 94 %.  General appearance: alert, cooperative, appears stated age and no distress Neck: no JVD Lungs: clear to auscultation bilaterally Heart: irregularly irregular rhythm and increased rate Abdomen: soft, non-tender; bowel sounds normal; no masses,  no organomegaly Extremities: extremities normal, atraumatic, no cyanosis or edema Pulses: 2+ and symmetric Skin: Skin color, texture, turgor normal. No rashes or lesions Neurologic: Grossly normal    Labs:   Results for orders placed or performed during the hospital encounter of 07/09/17 (from the past 24 hour(s))  I-Stat Troponin, ED (not at [facility])     Status: None   Collection Time: 07/09/17  1:19 PM  Result Value Ref Range   Troponin i, poc 0.02 0.00 - 0.08 ng/mL   Comment 3          I-Stat Chem 8, ED     Status: Abnormal   Collection Time: 07/09/17  1:21 PM  Result Value Ref Range   Sodium 131 (L) 135 - 145 mmol/L   Potassium 3.9 3.5 - 5.1 mmol/L   Chloride 96 (L) 101 - 111 mmol/L   BUN 38 (H) 6 - 20 mg/dL   Creatinine, Ser 1.20 (H) 0.44 - 1.00 mg/dL   Glucose, Bld 133 (H) 65 - 99 mg/dL   Calcium, Ion 0.98 (L) 1.15 - 1.40 mmol/L   TCO2 24 0 - 100 mmol/L   Hemoglobin 13.9 12.0 - 15.0 g/dL   HCT 41.0 36.0 - 46.0 %  CBC with Differential/Platelet     Status: Abnormal (Preliminary result)   Collection Time: 07/09/17  1:21 PM  Result Value Ref Range   WBC 9.8 4.0 - 10.5 K/uL   RBC 3.98 3.87 - 5.11 MIL/uL   Hemoglobin 11.4 (L) 12.0 - 15.0 g/dL   HCT 34.2 (L) 36.0 - 46.0 %   MCV 85.9 78.0 - 100.0 fL   MCH 28.6 26.0 - 34.0 pg   MCHC 33.3 30.0 - 36.0 g/dL   RDW 13.8 11.5 - 15.5 %  Platelets 242 150 - 400 K/uL   Neutrophils Relative % PENDING %   Neutro Abs PENDING 1.7 - 7.7 K/uL   Band Neutrophils PENDING %   Lymphocytes Relative PENDING %   Lymphs Abs PENDING 0.7 - 4.0 K/uL   Monocytes Relative PENDING %   Monocytes Absolute PENDING 0.1 - 1.0 K/uL   Eosinophils Relative PENDING %   Eosinophils  Absolute PENDING 0.0 - 0.7 K/uL   Basophils Relative PENDING %   Basophils Absolute PENDING 0.0 - 0.1 K/uL   WBC Morphology PENDING    RBC Morphology PENDING    Smear Review PENDING    nRBC PENDING 0 /100 WBC   Metamyelocytes Relative PENDING %   Myelocytes PENDING %   Promyelocytes Absolute PENDING %   Blasts PENDING %     Radiology/Studies: No results found.  EKG: 08/08/17- AF with RVR  ASSESSMENT AND PLAN:  Principal Problem:   Near syncope Active Problems:   Atrial fibrillation with RVR (HCC)   SSS (sick sinus syndrome) (HCC)   Hypertensive cardiovascular disease   Dementia   Essential hypertension   Current use of long term anticoagulation   PLAN: She has been given IV Lopressor and IV Diltiazem. Will start Diltiazem IV drip. Watch for bradycardia when she converts.    Angelena Form, PA-C Aug 08, 2017, 2:34 PM [phone]  Patient seen and independently examined with Kerin Ransom, PA. We discussed all aspects of the encounter. I agree with the assessment and plan as stated above.  Patient has a history of persistent atrial fibrillation and has been tried on multiple meds in the past.  She got bradycardic on a combination of Diltiazem and lopressor and Amio caused itching.  She has been on Lopressor but her daughter doesn't think that she has been taking her meds correctly.  She was at her PCP office today and became dizzy and had presyncope.  She was noted to have a HR > 150 and EKG showed afib with RVR and was sent to the ER.  Currently she has no complaints. She denies any chest pain, pressure, SOB, DOE or palpitations.  Currently her HR is 150-180bpm in afib.  BP is stable.  Exam shows lungs CTA bilaterally, heart is irregularly irregular with no M/R/G and no edema on LE.  EKG shows atrial fibrillation with RVR and ST depression in the inferolateral leads.  Of note, cath a year ago showed minimal nonobstructive CAD.  Recommend starting IV Cardizem gtt with 10mg  bolus and  then gtt at 5mg /hr and titrate to keep HR < 105bpm.  Once rate controlled, will increase PO BB and stop CCB since she has had problems with the combination in the past with bradycardia. She is on Eliquis 2.5mg  BID.  Recommend decreasing losartan to 50mg  daily if needed for better BP to titrate BB.  Signed: [name], MD [facility] 2017-08-08

## 2017-07-09 NOTE — Progress Notes (Signed)
Careteam: Patient Care Team: Gayland Curry, DO as PCP - General (Geriatric Medicine) Sanda Klein, MD as Consulting Physician (Cardiology) Sherlynn Stalls, MD as Consulting Physician (Ophthalmology) Murriel Hopper, MD as Referring Physician (Student)  Advanced Directive information    Allergies  Allergen Reactions  . Bactrim [Sulfamethoxazole-Trimethoprim] Nausea Only  . Amiodarone Itching  . Amoxicillin Other (See Comments)    Unknown- ? Upset stomach  . Demerol [Meperidine] Other (See Comments)    Hallucinations  . Hydralazine Other (See Comments)    Tingling and chest pain   . Lisinopril Cough  . Zoloft [Sertraline Hcl] Other (See Comments)    Unknown  . Tape Rash and Other (See Comments)    Can tear the skin, also    Chief Complaint  Patient presents with  . Acute Visit    Pt is feeling dizzy and lightheaded Pt states that she feels like she is going to pass out.      HPI: Patient is a 83 y.o. female seen in the office today due to feeling like she is going to past out. Pt was in office to follow up urine due to UTI when she drank some water and felt like she was going to pass out.  Thought if she laid back she would feel better. Pt got to office around 1015 am. Reports she had not eaten breakfast but took medication this morning. Then reports she did not eat dinner last night but is also very confused and more so than baseline and daughter stated she thought she went to dinner.  Pt was placed in the exam room and started experiencing chest pains and numbness to left arm and cont to feel lightheaded 1 hour later.  Daughter reports she is less lethargic then she has been.  No visual changes or shortness of breath.  Glucose 161.  Review of Systems:  Review of Systems  Constitutional: Negative for chills, fever and weight loss.  HENT: Negative for tinnitus.   Respiratory: Negative for cough, sputum production and shortness of breath.   Cardiovascular: Positive  for chest pain and palpitations. Negative for leg swelling.  Gastrointestinal: Negative for abdominal pain, constipation, diarrhea and heartburn.  Genitourinary: Positive for frequency. Negative for dysuria and urgency.  Musculoskeletal: Negative for back pain, falls, joint pain and myalgias.  Skin: Negative.   Neurological: Positive for dizziness. Negative for headaches.       Lightheaded   Psychiatric/Behavioral: Positive for memory loss.    Past Medical History:  Diagnosis Date  . Abnormal weight loss 05/28/2004  . Anxiety 01/17/2003  . Arthritis    "a little; qwhere" (5/25/20170  . Back pain 08/19/2005   with radiculopathy  . Back pain 05/07/2017  . Bradycardia, drug induced   . Cerebral atherosclerosis 09/24/1999  . Chest pain, atypical    cath 05/03/2016 for recurrent CP, mild nonobstructive dx. Mild to moderate R renal artery stenosis, patent L renal artery  . Dementia    "don't know kind or stage" (05/02/2016)  . Depression 09/06/2002  . Diverticulitis 09/28/2007  . Diverticulosis of colon 06/07/2003  . External hemorrhoids 04/12/2009  . Female climacteric state 03/04/2000  . Formed visual hallucinations    [name]?  Failed Keppra and Depakote  . GERD (gastroesophageal reflux disease)   . Hallucination, visual   . Hypertension   . Hypertension    cath 05/03/2016 Mild to moderate R renal artery stenosis, patent L renal artery  . Leg pain, left   .  Macular degeneration   . Malaise and fatigue   . Neurocysticercosis    Craniotomy at [facility] in early 2000s  . Osteoarthrosis, localized   . Osteoporosis 05/28/2004  . Palpitations 11/16/2001  . Paranoia ([place])   . Paroxysmal A-fib ([place])   . PSVT (paroxysmal supraventricular tachycardia) ([place]) 02/22/2002  . Pure hypercholesterolemia 12/20/1999  . TIA (transient ischemic attack) 2000s?  [name] Urinary incontinence 08/18/2007  . UTI (urinary tract infection)   . Vaginitis, atrophic   . Vertigo, peripheral 10/07/2000   Past  Surgical History:  Procedure Laterality Date  . BRAIN SURGERY  2000   Neurocysticercosis ("parasite")  . CARDIAC CATHETERIZATION N/A 05/03/2016   Procedure: Left Heart Cath and Coronary Angiography;  Surgeon: Sherren Mocha, MD;  Location: [place] [facility];  Service: Cardiovascular;  Laterality: N/A;  . CATARACT EXTRACTION  2013  . CATARACT EXTRACTION, BILATERAL     "not sure if both eyes; feel like it probably was"  . JOINT REPLACEMENT    . PERIPHERAL VASCULAR CATHETERIZATION N/A 05/03/2016   Procedure: Abdominal Aortogram;  Surgeon: Sherren Mocha, MD;  Location: [place] [facility];  Service: Cardiovascular;  Laterality: N/A;  . SHOULDER ARTHROSCOPY W/ ROTATOR CUFF REPAIR Right X 3  . TOTAL KNEE ARTHROPLASTY Left 1990  . VAGINAL HYSTERECTOMY  1960   Social History:   reports that she has never smoked. She has never used smokeless tobacco. She reports that she does not drink alcohol or use drugs.  Family History  Problem Relation Age of Onset  . Polycystic kidney disease Mother   . Heart attack Father   . Hypertension Sister   . Dementia Neg Hx     Medications: Patient's Medications  New Prescriptions   No medications on file  Previous Medications   ACETAMINOPHEN (TYLENOL) 325 MG TABLET    Take 650 mg by mouth every 6 (six) hours as needed for mild pain.   AMLODIPINE (NORVASC) 5 MG TABLET    Take 1 tablet (5 mg total) by mouth daily.   APIXABAN (ELIQUIS) 2.5 MG TABS TABLET    Take 1 tablet (2.5 mg total) by mouth 2 (two) times daily.   CHOLECALCIFEROL (VITAMIN D3) 2000 UNITS TABS    Take 2,000 Units by mouth daily.    DOCUSATE SODIUM (COLACE) 100 MG CAPSULE    Take 100 mg by mouth 2 (two) times daily.    LOSARTAN (COZAAR) 100 MG TABLET    Take 1 tablet (100 mg total) by mouth daily.   METOPROLOL TARTRATE (LOPRESSOR) 25 MG TABLET    Take 1 tablet (25 mg total) by mouth 2 (two) times daily.   VENLAFAXINE (EFFEXOR) 37.5 MG TABLET    Take 1 tablet (37.5 mg total) by mouth 2 (two)  times daily.  Modified Medications   No medications on file  Discontinued Medications   CIPROFLOXACIN (CIPRO) 500 MG TABLET    Take 1 tablet (500 mg total) by mouth 2 (two) times daily.     Physical Exam:  Vitals:   07/09/17 1212  BP: (!) 148/82  Pulse: 88  Resp: 17  Temp: (!) 97.4 F (36.3 C)  TempSrc: Oral  SpO2: 98%   There is no height or weight on file to calculate BMI.  Physical Exam  Constitutional: She appears well-developed and well-nourished. No distress.  Cardiovascular: Normal heart sounds.  An irregular rhythm present. Tachycardia present.   Pulmonary/Chest: Effort normal and breath sounds normal. No respiratory distress.  Abdominal: Soft. Bowel sounds are normal. She exhibits  no distension. There is no tenderness.  Musculoskeletal: Normal range of motion. She exhibits no tenderness.  Neurological:  Lethargic, speech garbled, falling asleep easily Oriented to self only  Skin: Skin is warm and dry. Capillary refill takes less than 2 seconds.  Psychiatric: She has a normal mood and affect.    Labs reviewed: Basic Metabolic Panel:  Recent Labs  05/01/17 1129 05/03/17 0808 06/27/17 1208  NA 140 137 136  K 3.3* 3.0* 4.2  CL 105 103 103  CO2 21 26 19*  GLUCOSE 97 101* 104*  BUN 11 11 34*  CREATININE 1.05* 1.22* 1.23*  CALCIUM 9.0 9.1 9.0  TSH  --   --  5.35*   Liver Function Tests:  Recent Labs  09/19/16 1215 05/01/17 1129  AST 23 17  ALT 12 11  ALKPHOS 84 87  BILITOT 0.5 0.6  PROT 7.3 6.8  ALBUMIN 3.9 3.6    Recent Labs  05/01/17 1119  LIPASE 48  AMYLASE 46   No results for input(s): AMMONIA in the last 8760 hours. CBC:  Recent Labs  05/01/17 1129 05/03/17 0808 06/27/17 1208  WBC 7.6 5.2 6.4  NEUTROABS 5,928 3.7 4,480  HGB 11.4* 10.8* 12.0  HCT 34.6* 33.7* 36.3  MCV 89.6 91.6 88.3  PLT 240 238 251   Lipid Panel: No results for input(s): CHOL, HDL, LDLCALC, TRIG, CHOLHDL, LDLDIRECT in the last 8760 hours. TSH:  Recent  Labs  06/27/17 1208  TSH 5.35*   A1C: Lab Results  Component Value Date   HGBA1C 6.2 (H) 05/02/2016     Assessment/Plan 1. Lightheadedness with Atrial fibrillation with RVR (HCC) - POC Glucose (CBG) 161 - EKG 12-Lead showing a fib with RVR- rate of 151 feeling like she's going to pass out. Pt with hx of a fib. Currently on eliquis and lopressor. Daughter reports compliance with medication due to pill reminder EMS called and pt sent to [facility] via EMS for further evaluation.  To follow up once she is discharged.   Carlos American. Harle Battiest  [facility] [phone] 8 am - 5 pm) [phone] (after hours)

## 2017-07-10 LAB — BASIC METABOLIC PANEL
Anion gap: 12 (ref 5–15)
BUN: 26 mg/dL — ABNORMAL HIGH (ref 6–20)
CO2: 24 mmol/L (ref 22–32)
Calcium: 8.8 mg/dL — ABNORMAL LOW (ref 8.9–10.3)
Chloride: 98 mmol/L — ABNORMAL LOW (ref 101–111)
Creatinine, Ser: 1.28 mg/dL — ABNORMAL HIGH (ref 0.44–1.00)
GFR calc Af Amer: 42 mL/min — ABNORMAL LOW (ref >60)
GFR calc non Af Amer: 36 mL/min — ABNORMAL LOW (ref >60)
Glucose, Bld: 102 mg/dL — ABNORMAL HIGH (ref 65–99)
Potassium: 3.3 mmol/L — ABNORMAL LOW (ref 3.5–5.1)
Sodium: 134 mmol/L — ABNORMAL LOW (ref 135–145)

## 2017-07-10 LAB — MRSA PCR SCREENING: MRSA by PCR: NEGATIVE

## 2017-07-10 MED ORDER — SODIUM CHLORIDE 0.9% FLUSH: 3.0000 mL | INTRAVENOUS | Status: DC | PRN

## 2017-07-10 MED ORDER — SODIUM CHLORIDE 0.9 % IV SOLN: 250.0000 mL | INTRAVENOUS | Status: DC

## 2017-07-10 MED ORDER — METOPROLOL TARTRATE 50 MG PO TABS
50.0000 mg | ORAL_TABLET | Freq: Two times a day (BID) | ORAL | Status: DC
  Administered 2017-07-10 – 2017-07-11 (×3): 50 mg via ORAL
  Filled 2017-07-10 (×3): qty 1

## 2017-07-10 MED ORDER — POTASSIUM CHLORIDE CRYS ER 20 MEQ PO TBCR: 40.0000 meq | EXTENDED_RELEASE_TABLET | Freq: Two times a day (BID) | ORAL | Status: DC

## 2017-07-10 MED ORDER — SODIUM CHLORIDE 0.9 % IV SOLN
INTRAVENOUS | Status: DC
  Administered 2017-07-10: 10:00:00 via INTRAVENOUS

## 2017-07-10 MED ORDER — SODIUM CHLORIDE 0.9% FLUSH
3.0000 mL | Freq: Two times a day (BID) | INTRAVENOUS | Status: DC
  Administered 2017-07-10 – 2017-07-16 (×8): 3 mL via INTRAVENOUS

## 2017-07-10 MED ORDER — SODIUM CHLORIDE 0.9 % IV SOLN
INTRAVENOUS | Status: DC
  Administered 2017-07-14: 14:00:00 via INTRAVENOUS

## 2017-07-10 MED ORDER — POTASSIUM CHLORIDE CRYS ER 20 MEQ PO TBCR
40.0000 meq | EXTENDED_RELEASE_TABLET | Freq: Once | ORAL | Status: AC
  Administered 2017-07-10: 40 meq via ORAL
  Filled 2017-07-10: qty 2

## 2017-07-10 NOTE — Progress Notes (Signed)
TEE/DCCV unable to be accommodated on schedule tomorrow per endoscopy. D/w Dr. Radford Pax, have scheduled for Monday 8/6 - next available slot was 2pm with Dr. Debara Pickett. Orders written. Released NPO after midnight Monday order, remainder are under sign/held. Also notified nurse to keep patient informed. Dayna Dunn PA-C

## 2017-07-10 NOTE — Progress Notes (Signed)
SATURATION QUALIFICATIONS: (This note is used to comply with regulatory documentation for home oxygen)  Patient Saturations on Room Air at Rest = 95%  Patient Saturations on Room Air while Ambulating = 85%  Patient Saturations on 3 Liters of oxygen while Ambulating = 100%  Please briefly explain why patient needs home oxygen: The patient is unable to keep oxygen level above 90% when ambulating. Tried on 2 liters still de stating.   Saddie Benders RN

## 2017-07-10 NOTE — Plan of Care (Signed)
Problem: Safety: Goal: Ability to remain free from injury will improve Outcome: Progressing Pt is pleasantly confused and at risk for falls. Fall risk bundle remains in place per policies. No falls, injuries or skin breakdown this shift.   Problem: Tissue Perfusion: Goal: Risk factors for ineffective tissue perfusion will decrease Outcome: Progressing Pt on Cardizem drip at 40ml/hr for most of the shift. Now decreased to 54ml/hr due to 2 sec pause. Pt remains in Afib with HR in the 110-125 range. Cardiology notified and instructed to leave pt at 76ml/hr at this time. Will continue to monitor this shift.

## 2017-07-10 NOTE — Progress Notes (Addendum)
Progress Note  Patient Name: Brittney Gutierrez Date of Encounter: 07/10/2017  Primary Cardiologist: Dr. Sallyanne Kuster  Subjective   Denies any chest pain or SOB  Inpatient Medications    Scheduled Meds: . apixaban  2.5 mg Oral BID  . cholecalciferol  2,000 Units Oral Daily  . docusate sodium  100 mg Oral BID  . losartan  100 mg Oral Daily  . metoprolol tartrate  25 mg Oral BID  . venlafaxine  37.5 mg Oral BID   Continuous Infusions: . diltiazem (CARDIZEM) infusion 15 mg/hr (07/09/17 2103)   PRN Meds: acetaminophen, nitroGLYCERIN, ondansetron (ZOFRAN) IV   Vital Signs    Vitals:   07/10/17 0400 07/10/17 0512 07/10/17 0552 07/10/17 0554  BP: 121/72     Pulse: (!) 120  (!) 57 60  Resp: (!) 24  17 20   Temp:  (P) 97.6 F (36.4 C)    TempSrc:      SpO2: 96%  97% 97%  Weight:      Height:       No intake or output data in the 24 hours ending 07/10/17 0848 Filed Weights   07/09/17 1256  Weight: 129 lb (58.5 kg)    Telemetry    Atrial fibrillation with RVR in the 120's - Personally Reviewed  ECG    Atrial fibrillation with RVR, LVH with repol abnormality, lateral ST abnormality - Personally Reviewed  Physical Exam   GEN: No acute distress.   Neck: No JVD Cardiac: irregularly irregular and tachy, no murmurs, rubs, or gallops.  Respiratory: Clear to auscultation bilaterally. GI: Soft, nontender, non-distended  MS: No edema; No deformity. Neuro:  Nonfocal  Psych: Normal affect   Labs    Chemistry Recent Labs Lab 07/09/17 1321 07/10/17 0303  NA 131* 134*  K 3.9 3.3*  CL 96* 98*  CO2  --  24  GLUCOSE 133* 102*  BUN 38* 26*  CREATININE 1.20* 1.28*  CALCIUM  --  8.8*  GFRNONAA  --  36*  GFRAA  --  42*  ANIONGAP  --  12     Hematology Recent Labs Lab 07/09/17 1321  WBC 9.8  RBC 3.98  HGB 11.4*  13.9  HCT 34.2*  41.0  MCV 85.9  MCH 28.6  MCHC 33.3  RDW 13.8  PLT 242    Cardiac EnzymesNo results for input(s): TROPONINI in the last 168  hours.  Recent Labs Lab 07/09/17 1319  TROPIPOC 0.02     BNPNo results for input(s): BNP, PROBNP in the last 168 hours.   DDimer No results for input(s): DDIMER in the last 168 hours.   Radiology    No results found.  Cardiac Studies   Cardiac Cath 04/2016 Conclusion    Prox RCA lesion, 30% stenosed.  Ost LM lesion, 20% stenosed.  Mid Cx lesion, 30% stenosed.   1. Patent coronary arteries with mild diffuse coronary artery disease 2. Mild to moderate right renal artery stenosis, widely patent left renal artery 3. Normal LVEDP  Recommendations: Medical therapy for severe hypertension   2D echo 02/2016 Study Conclusions  - Left ventricle: The cavity size was normal. Wall thickness was   increased in a pattern of moderate LVH. Systolic function was   normal. The estimated ejection fraction was in the range of 50%   to 55%. Wall motion was normal; there were no regional wall   motion abnormalities. The study is not technically sufficient to   allow evaluation of LV diastolic function. - Aortic valve:  There was mild regurgitation. - Mitral valve: Moderately calcified annulus. Mildly thickened   leaflets . There was mild regurgitation. - Pulmonary arteries: Systolic pressure was mildly increased. PA   peak pressure: 39 mm Hg (S).   Patient Profile     83 y.o. female with a history of PAF and SSS on chronic anticoagulation with a CHADS2VASC score of 6 who was admitted with presyncope and atrial fibrillation with RVR.  Assessment & Plan    1.  Atrial fibrillation with RVR - HR remains elevated at 127bpm while resting in bed.   - increase Cardizem gtt to 15mg /hr - continue Eliquis 2.5mg  BID (age > 60 and weight < 60kg).  She has missed several doses in the past few weeks according to her daughters therefore cannot proceed with DCCV. - increase metoprolol to 50mg  BID - will make NPO after MN - TEE/DCCV in am (she ate breakfast this am) if HR remains elevated  2.   History of bradycardia in the setting of Cardizem and BB together. - once TEE/DCCV done will stop CCB  3.  Nonobstructive CAD by cath 2017 - denies CP - no ASA due to NOAC  4.  HTN - BP well controlled - continue BB and ARB  5.  Hypokalemia - replete potassium - check mag leve   Risks and benefits of transesophageal echocardiogram have been explained including risks of esophageal damage, perforation (1:10,000 risk), bleeding, pharyngeal hematoma as well as other potential complications associated with conscious sedation including aspiration, arrhythmia, respiratory failure and death. Alternatives to treatment were discussed, questions were answered. Patient is willing to proceed.   I have spent a total of 35 minutes with patient reviewing hospital notes , telemetry, EKGs, labs and examining patient as well as establishing an assessment and plan that was discussed with the patient.  > 50% of time was spent in direct patient care.    [name], MD  07/10/2017 9:19 AM  Signed, [name], MD  07/10/2017, 8:48 AM

## 2017-07-10 NOTE — Consult Note (Signed)
[facility] CM Primary Care Navigator  07/10/2017  Serai Tukes [birth year] [id number]   Met with patient and daughter (Jo)at the bedside to identify possibledischarge needs. Daughter reports that patient almost fainted, and found to have elevated heart rate while at the doctor's office yesterday which led to this admission.  Patient's daughter endorses Hollace Kinnier, [place] as the primary care provider.  [facility] [address]. Her other daughter [name]) manages hermedications using "pill box"system filled monthly and a healthcare staff (from [facility]) will be administering medications to patient twice a day per daughter.  Daughter [name]) has been providing transportationto her doctors'appointments.  Patient resides at the [facility]. Daughters will be providing care for her at home or will hire a sitter from [facility] if needed according to daughter.  Anticipated discharge plan is to go back to [facility] per daughter.  Patient and daughter expressed understanding to call primary care provider's office when she returns home to schedule a post discharge follow-up appointment within a week or sooner if needs arise. Patient letter (with PCP's contact number) was provided as a reminder.  Explained to patient about [facility] CM services available but daughter denies any currentconcerns or healthmanagement needs at this time.  EM[facility] General Calls offered to followup patient was declined by daughter due to patient's dementia ("may agitate her").  [facility] care management information provided for any future needs that patient may have.   For questions, please contact:  Dannielle Huh, BSN, RN- [facility] Primary Care Navigator  Telephone: [phone] [place]

## 2017-07-11 ENCOUNTER — Inpatient Hospital Stay (HOSPITAL_COMMUNITY): Payer: Medicare Other

## 2017-07-11 DIAGNOSIS — R4701 Aphasia: Secondary | ICD-10-CM

## 2017-07-11 DIAGNOSIS — I351 Nonrheumatic aortic (valve) insufficiency: Secondary | ICD-10-CM

## 2017-07-11 DIAGNOSIS — R0789 Other chest pain: Secondary | ICD-10-CM

## 2017-07-11 LAB — BASIC METABOLIC PANEL
ANION GAP: 8 (ref 5–15)
BUN: 29 mg/dL — ABNORMAL HIGH (ref 6–20)
CHLORIDE: 100 mmol/L — AB (ref 101–111)
CO2: 23 mmol/L (ref 22–32)
Calcium: 8.6 mg/dL — ABNORMAL LOW (ref 8.9–10.3)
Creatinine, Ser: 1.7 mg/dL — ABNORMAL HIGH (ref 0.44–1.00)
GFR calc Af Amer: 30 mL/min — ABNORMAL LOW (ref >60)
GFR, EST NON AFRICAN AMERICAN: 26 mL/min — AB (ref >60)
GLUCOSE: 113 mg/dL — AB (ref 65–99)
POTASSIUM: 4.1 mmol/L (ref 3.5–5.1)
Sodium: 131 mmol/L — ABNORMAL LOW (ref 135–145)

## 2017-07-11 LAB — ECHOCARDIOGRAM COMPLETE
Ao-asc: 34 cm
CHL CUP RV SYS PRESS: 48 mmHg
CHL CUP STROKE VOLUME: 20 mL
CHL CUP TV REG PEAK VELOCITY: 289 cm/s
EWDT: 165 ms
FS: 11 % — AB (ref 28–44)
Height: 62 in
IVS/LV PW RATIO, ED: 1.04
LA diam end sys: 35 mm
LA diam index: 2.2 cm/m2
LA vol A4C: 58.9 ml
LA vol index: 42.5 mL/m2
LASIZE: 35 mm
LAVOL: 67.5 mL
LV PW d: 13.1 mm — AB (ref 0.6–1.1)
LV SIMPSON'S DISK: 44
LV sys vol: 26 mL (ref 14–42)
LVDIAVOL: 46 mL (ref 46–106)
LVDIAVOLIN: 29 mL/m2
LVOT SV: 34 mL
LVOT VTI: 12.1 cm
LVOT area: 2.84 cm2
LVOT diameter: 19 mm
LVOT peak vel: 72.5 cm/s
LVSYSVOLIN: 16 mL/m2
MV Dec: 165
MV Peak grad: 3 mmHg
MVPKEVEL: 90 m/s
P 1/2 time: 288 ms
PISA EROA: 0.09 cm2
TAPSE: 15.4 mm
TR max vel: 289 cm/s
VTI: 148 cm
Weight: 2078.4 oz

## 2017-07-11 LAB — MAGNESIUM: Magnesium: 1.8 mg/dL (ref 1.7–2.4)

## 2017-07-11 LAB — GLUCOSE, CAPILLARY: GLUCOSE-CAPILLARY: 187 mg/dL — AB (ref 65–99)

## 2017-07-11 LAB — D-DIMER, QUANTITATIVE: D-Dimer, Quant: 0.75 ug/mL-FEU — ABNORMAL HIGH (ref 0.00–0.50)

## 2017-07-11 MED ORDER — METOPROLOL TARTRATE 50 MG PO TABS
75.0000 mg | ORAL_TABLET | Freq: Two times a day (BID) | ORAL | Status: DC
  Administered 2017-07-11 – 2017-07-18 (×13): 75 mg via ORAL
  Filled 2017-07-11 (×13): qty 1

## 2017-07-11 NOTE — Code Documentation (Signed)
83yo female admitted for atrial fibrillation with RVR on 07/09/2017.  Patient with h/o atrial fibrillation on Eliquis.  Today cardiology assessed patient to have difficulty speaking and requested a code stroke to be activated.  However, two daughters at the bedside who report patient has been having difficulty with her speech since Wednesday noting episodes of slurred speech and "not making any sense" since that time.  They also report that patient has been declining over the last month with increased confusion and falls.  Stroke team responded to the bedside.  NIHSS 4, see documentation for details and code stroke times.  Patient with mild dysarthria and expressive aphasia on exam.  Dr. Lorraine Lax at the bedside.  Code stroke canceled.  MRI ordered.  No acute stroke treatment at this time.

## 2017-07-11 NOTE — Progress Notes (Signed)
2D echo showed low normal LVF with EF 50-55% with moderate pulmonary HTN with PASP 35mmHg which has increased by 35mmHg compared to a year ago.  ? Due to undiagnosed OSA.  Her daughters state that she sleeps very restless at home and snores loudly.  Will check a ddimer to rule out acute PE since she had presyncope in setting of new onset afib.  She  Will need an outpt sleep study.

## 2017-07-11 NOTE — Progress Notes (Signed)
  Echocardiogram 2D Echocardiogram has been performed.  Brittney Gutierrez 07/11/2017, 2:02 PM

## 2017-07-11 NOTE — Progress Notes (Signed)
Spoke with Daune Perch PA with Cardiology who states it is ok for VQ scan to be done tomorrow morning.  Sanda Linger

## 2017-07-11 NOTE — Progress Notes (Addendum)
Progress Note  Patient Name: Brittney Gutierrez Date of Encounter: 07/11/2017  Primary Cardiologist: Dr. Sallyanne Kuster  Subjective   Patient is sitting on the bedside commode and not answering questions appropriately. Family is present and concerned that she has been having trouble with speech and not making sense as well as not sleeping at night for a couple of weeks.   Inpatient Medications    Scheduled Meds: . apixaban  2.5 mg Oral BID  . cholecalciferol  2,000 Units Oral Daily  . docusate sodium  100 mg Oral BID  . losartan  100 mg Oral Daily  . metoprolol tartrate  50 mg Oral BID  . sodium chloride flush  3 mL Intravenous Q12H  . venlafaxine  37.5 mg Oral BID   Continuous Infusions: . sodium chloride    . sodium chloride    . diltiazem (CARDIZEM) infusion 12.5 mg/hr (07/11/17 1021)   PRN Meds: acetaminophen, nitroGLYCERIN, ondansetron (ZOFRAN) IV, sodium chloride flush   Vital Signs    Vitals:   07/10/17 2000 07/11/17 0034 07/11/17 0541 07/11/17 0759  BP: 118/80 113/85 134/75 124/70  Pulse: (!) 126 (!) 127 (!) 121 (!) 131  Resp:  17 17 18   Temp: 97.9 F (36.6 C)  98 F (36.7 C) 98.2 F (36.8 C)  TempSrc: Oral  Oral Oral  SpO2: 97% 90% 91% 96%  Weight:  130 lb (59 kg) 129 lb 14.4 oz (58.9 kg)   Height:        Intake/Output Summary (Last 24 hours) at 07/11/17 1022 Last data filed at 07/11/17 0000  Gross per 24 hour  Intake           924.04 ml  Output                0 ml  Net           924.04 ml   Filed Weights   07/09/17 1256 07/11/17 0034 07/11/17 0541  Weight: 129 lb (58.5 kg) 130 lb (59 kg) 129 lb 14.4 oz (58.9 kg)    Telemetry    Atrial fibrillation with rapid ventricular rates in the 110's -130s - Personally Reviewed  ECG    Atrial fibrillation with RVR, 127 bpm, LAD, LVH with repolarization abnormality -Personally Reviewed  Physical Exam   GEN: Elderly female in No acute distress.   Neck: No JVD Cardiac: irregularly irregular and tachy, no  murmurs, rubs, or gallops.  Respiratory: Clear to auscultation bilaterally. GI: Soft, nontender, non-distended  MS: No edema; No deformity. Neuro:  Nonfocal  Psych: Normal affect   Labs    Chemistry  Recent Labs Lab 07/09/17 1321 07/10/17 0303 07/11/17 0233  NA 131* 134* 131*  K 3.9 3.3* 4.1  CL 96* 98* 100*  CO2  --  24 23  GLUCOSE 133* 102* 113*  BUN 38* 26* 29*  CREATININE 1.20* 1.28* 1.70*  CALCIUM  --  8.8* 8.6*  GFRNONAA  --  36* 26*  GFRAA  --  42* 30*  ANIONGAP  --  12 8     Hematology  Recent Labs Lab 07/09/17 1321  WBC 9.8  RBC 3.98  HGB 11.4*  13.9  HCT 34.2*  41.0  MCV 85.9  MCH 28.6  MCHC 33.3  RDW 13.8  PLT 242    Cardiac EnzymesNo results for input(s): TROPONINI in the last 168 hours.   Recent Labs Lab 07/09/17 1319  TROPIPOC 0.02     BNPNo results for input(s): BNP, PROBNP in the last  168 hours.   DDimer No results for input(s): DDIMER in the last 168 hours.   Radiology    No results found.  Cardiac Studies   Cardiac Cath 04/2016 Conclusion   Prox RCA lesion, 30% stenosed.  Ost LM lesion, 20% stenosed.  Mid Cx lesion, 30% stenosed.   1. Patent coronary arteries with mild diffuse coronary artery disease 2. Mild to moderate right renal artery stenosis, widely patent left renal artery 3. Normal LVEDP  Recommendations: Medical therapy for severe hypertension   2D echo 02/2016 Study Conclusions  - Left ventricle: The cavity size was normal. Wall thickness was   increased in a pattern of moderate LVH. Systolic function was   normal. The estimated ejection fraction was in the range of 50%   to 55%. Wall motion was normal; there were no regional wall   motion abnormalities. The study is not technically sufficient to   allow evaluation of LV diastolic function. - Aortic valve: There was mild regurgitation. - Mitral valve: Moderately calcified annulus. Mildly thickened   leaflets . There was mild regurgitation. -  Pulmonary arteries: Systolic pressure was mildly increased. PA   peak pressure: 39 mm Hg (S).   Patient Profile     83 y.o. female with a history of PAF and SSS on chronic anticoagulation with a CHADS2VASC score of 6 who was admitted with presyncope and atrial fibrillation with RVR.  Assessment & Plan    1.  Atrial fibrillation with RVR - HR remains elevated at 127bpm while resting in bed.   - Cardizem drip is running at 12.5 mg per hour - continue Eliquis 2.5mg  BID (age > 54 and weight < 60kg).  She has missed several doses in the past few weeks according to her daughters therefore cannot proceed with DCCV. - Will increase metoprolol to 75 mg BID -Endoscopy schedule was unable to accommodate TEE/DCCV for today. Has been scheduled for Monday 8/6 at 2 PM with Dr. Debara Pickett.  -Will recheck echocardiogram. -Patient with neurologic changes. Seen with Dr. Radford Pax. Have requested a code stroke for neurologic evaluation.   2.  History of bradycardia in the setting of Cardizem and BB together. - once TEE/DCCV done will stop CCB  3.  Nonobstructive CAD by cath 2017 - denies CP - no ASA due to NOAC  4.  HTN  - BP well controlled - continue BB and ARB - Monitor closely while on IV Cardizem  5.  Hypokalemia - Improved today, potassium level of 4.1. Magnesium level is adequate at 1.8   On 07/10/17- Risks and benefits of transesophageal echocardiogram have been explained including risks of esophageal damage, perforation (1:10,000 risk), bleeding, pharyngeal hematoma as well as other potential complications associated with conscious sedation including aspiration, arrhythmia, respiratory failure and death. Alternatives to treatment were discussed, questions were answered. Patient is willing to proceed.    Daune Perch, NP  07/11/2017 10:22 AM  Signed, Daune Perch, NP  07/11/2017, 10:22 AM

## 2017-07-11 NOTE — Consult Note (Signed)
Requesting Physician: Dr. Sallyanne Kuster    Chief Complaint: Expressive aphasia and slurred speech  History obtained from: Patient and Chart   HPI:                                                                                                                                      Brittney Gutierrez is an 83 y.o. 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 There, she was found to have a heart rate of >150. En route she was found to have a heart rate of 170 - afib with RVR. Per report, she has not been taking her medications regularly. Atrial fibrillation is a new diagnosis.  A code stroke was called when Brittney Gutierrez was found to have slurred speech and was unable to speak coherent sentences.  On further questioning, her daughters stated that she's been having trouble "getting her words out" for at least three weeks. There was an abrupt worsening of this between 07/09/17 and 07/10/17 - she has been expressively aphasic since the morning of 07/10/17.  Pertinent Imaging: MRI 07/11/17: No acute abnormality. Moderate small vessel disease.  Pertinent Medications: Eliquis 2.5mg  daily Effexor 37.5 mg BID  Date last known well: Date: 07/09/2017 Time last known well: Unable to determine  tPA Given: No: Unknown LKW  Modified Rankin Score: 3  Stroke Risk Factors - atrial fibrillation and hypertension   Past Medical History:  Diagnosis Date  . Abnormal weight loss 05/28/2004  . Anxiety 01/17/2003  . Back pain 08/19/2005   with radiculopathy  . Bradycardia, drug induced    Diltiazem  . Cerebral atherosclerosis 09/24/1999  . Chest pain, atypical 04/2016   minor CAD at cath   . Chronic anticoagulation    Eliquis  . Dementia    "don't know kind or stage" (05/02/2016)  . Depression  09/06/2002  . Diverticulitis 09/28/2007  . External hemorrhoids 04/12/2009  . Female climacteric state 03/04/2000  . 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 GERD (gastroesophageal reflux disease)   . Hypertensive cardiovascular disease    moderate LVH on echo march 2017  . Macular degeneration   . Malaise and fatigue   . Neurocysticercosis    Craniotomy at [facility] in early 2000s  . Osteoarthrosis, localized   . Osteoporosis 05/28/2004  . Paranoia ([place])   . Paroxysmal A-fib ([place])   . TIA (transient ischemic attack) 2000s?  [name] Urinary incontinence 08/18/2007  . UTI (urinary tract infection)   . Vaginitis, atrophic   . Vertigo, peripheral 10/07/2000    Past Surgical History:  Procedure Laterality Date  . BRAIN SURGERY  2000   Neurocysticercosis ("parasite")  . CARDIAC CATHETERIZATION N/A 05/03/2016   Procedure: Left Heart  Cath and Coronary Angiography;  Surgeon: Sherren Mocha, MD;  Location: [place] [facility];  Service: Cardiovascular;  Laterality: N/A;  . CATARACT EXTRACTION  2013  . CATARACT EXTRACTION, BILATERAL     "not sure if both eyes; feel like it probably was"  . JOINT REPLACEMENT    . PERIPHERAL VASCULAR CATHETERIZATION N/A 05/03/2016   Procedure: Abdominal Aortogram;  Surgeon: Sherren Mocha, MD;  Location: [place] [facility];  Service: Cardiovascular;  Laterality: N/A;  . SHOULDER ARTHROSCOPY W/ ROTATOR CUFF REPAIR Right X 3  . TOTAL KNEE ARTHROPLASTY Left 1990  . VAGINAL HYSTERECTOMY  1960    Family History  Problem Relation Age of Onset  . Polycystic kidney disease Mother   . Heart attack Father   . Hypertension Sister   . Dementia Neg Hx      reports that she has never smoked. She has never used smokeless tobacco. She reports that she does not drink alcohol or use drugs.  Allergies  Allergen Reactions  . Bactrim [Sulfamethoxazole-Trimethoprim] Nausea Only  . Amiodarone Itching  . Amoxicillin Other (See  Comments)    Unknown- ? Upset stomach  . Demerol [Meperidine] Other (See Comments)    Hallucinations  . Hydralazine Other (See Comments)    Tingling and chest pain   . Lisinopril Cough  . Zoloft [Sertraline Hcl] Other (See Comments)    Unknown  . Tape Rash and Other (See Comments)    Can tear the skin, also    Medications:                                                                                                                       Current Meds  Medication Sig  . acetaminophen (TYLENOL) 325 MG tablet Take 650 mg by mouth every 6 (six) hours as needed for mild pain.  [name] amLODipine (NORVASC) 5 MG tablet Take 1 tablet (5 mg total) by mouth daily. (Patient taking differently: Take 5 mg by mouth every evening. )  . apixaban (ELIQUIS) 2.5 MG TABS tablet Take 1 tablet (2.5 mg total) by mouth 2 (two) times daily.  . Cholecalciferol (VITAMIN D3) 2000 units TABS Take 2,000 Units by mouth daily.   [name] docusate sodium (COLACE) 100 MG capsule Take 100 mg by mouth 2 (two) times daily.   [name] losartan (COZAAR) 100 MG tablet Take 1 tablet (100 mg total) by mouth daily.  . metoprolol tartrate (LOPRESSOR) 25 MG tablet Take 1 tablet (25 mg total) by mouth 2 (two) times daily.  [name] venlafaxine (EFFEXOR) 37.5 MG tablet Take 1 tablet (37.5 mg total) by mouth 2 (two) times daily.    Review Of Systems:  History obtained from unobtainable from patient due to mental status  Blood pressure 124/70, pulse (!) 131, temperature 98.2 F (36.8 C), temperature source Oral, resp. rate 18, height 5\' 2"  (1.575 m), weight 58.9 kg (129 lb 14.4 oz), SpO2 96 %.   Physical Examination:                                                                                                      General: WDWN female.  HEENT:  Normocephalic, no lesions, without obvious abnormality.  Normal external eye and conjunctiva.  Normal  external ears. Normal external nose, mucus membranes and septum.  Normal pharynx. Cardiovascular: S1, S2 normal, tachycardic, pulses palpable throughout   Pulmonary: Chest clear, unlabored breathign Abdomen: Soft Extremities: no joint deformities, effusion, or inflammation and no edema Lymphatic: no adenopathy palpable Musculoskeletal: no joint tenderness, deformity or swelling  Skin: warm and dry, no hyperpigmentation, vitiligo, or suspicious lesions  Neurological Examination:                                                                                               Mental Status: Brittney Gutierrez is alert, oriented to hospital (not specific), daughters, not oriented to year.  Speech occasionally slurred with mild expressive aphasia and paraphrasic errors. Also has left right confusion. Able to follow 3-step commands. Cranial Nerves: II: Visual field grossly normal on the left; Central vision lost on the right; pupils are equal, round, reactive to light. III,IV, VI: Ptosis not present, extra-ocular muscle movements intact bilaterally V,VII: Smile and eyebrow raise is symmetric. Facial light touch and pinprick sensation intact bilaterally VIII: Hearing grossly intact IX,X: Uvula and palate rise symmetrically XI: SCM and bilateral shoulder shrug strength 5/5 XII: Midline tongue extension Motor: Right :     Upper extremity   4/5   Left:     Upper extremity   4/5          Lower extremity   4/5    Lower extremity   4/5 Pronator drift not present Sensory: Pinprick and light touch intact throughout, bilaterally. Temperature sensed at the mid-shin. Deep Tendon Reflexes: 2+ and symmetric throughout BUE and AJ; dropped patellars Plantars: Right: downgoing   Left: downgoing Cerebellar: Finger-to-nose test without evidence of dysmetria or ataxia. Coordinated rapid alternating movements.  Proprioception: Vibratory sense intact Gait: Not tested  Lab Results: Basic Metabolic Panel:  Recent  Labs Lab 07/09/17 1321 07/10/17 0303 07/11/17 0233  NA 131* 134* 131*  K 3.9 3.3* 4.1  CL 96* 98* 100*  CO2  --  24 23  GLUCOSE 133* 102* 113*  BUN 38* 26* 29*  CREATININE 1.20* 1.28* 1.70*  CALCIUM  --  8.8* 8.6*  MG  --   --  1.8    Liver Function Tests: No results for input(s): AST, ALT, ALKPHOS, BILITOT, PROT, ALBUMIN in the last 168 hours. No results for input(s): LIPASE, AMYLASE in the last 168 hours. No results for input(s): AMMONIA in the last 168 hours.  CBC:  Recent Labs Lab 07/09/17 1321  WBC 9.8  NEUTROABS 6.8  HGB 11.4*  13.9  HCT 34.2*  41.0  MCV 85.9  PLT 242    Cardiac Enzymes: No results for input(s): CKTOTAL, CKMB, CKMBINDEX, TROPONINI in the last 168 hours.  Lipid Panel: No results for input(s): CHOL, TRIG, HDL, CHOLHDL, VLDL, LDLCALC in the last 168 hours.  CBG:  Recent Labs Lab 07/11/17 1213  GLUCAP 187*    Microbiology: Results for orders placed or performed during the hospital encounter of 07/09/17  MRSA PCR Screening     Status: None   Collection Time: 07/09/17 10:32 PM  Result Value Ref Range Status   MRSA by PCR NEGATIVE NEGATIVE Final    Comment:        The GeneXpert MRSA Assay (FDA approved for NASAL specimens only), is one component of a comprehensive MRSA colonization surveillance program. It is not intended to diagnose MRSA infection nor to guide or monitor treatment for MRSA infections.     Coagulation Studies: No results for input(s): LABPROT, INR in the last 72 hours.  Imaging: Mr Brain Wo Contrast  Result Date: 07/11/2017 CLINICAL DATA:  83 y/o  F; altered mental status of unclear cause. EXAM: MRI HEAD WITHOUT CONTRAST TECHNIQUE: Multiplanar, multiecho pulse sequences of the brain and surrounding structures were obtained without intravenous contrast. COMPARISON:  12/26/2015 CT head FINDINGS: Extensive motion degradation on multiple sequences. Brain: No acute infarction, hemorrhage, hydrocephalus, extra-axial  collection or mass lesion. Small focus of encephalomalacia in the left lateral parietal lobe. Moderate chronic microvascular ischemic changes of white matter and parenchymal volume loss of the brain. Vascular: Normal flow voids. Skull and upper cervical spine: Susceptibility artifact from surgical changes related to left parietal craniotomy. Sinuses/Orbits: Right maxillary sinus opacification. No abnormal signal of mastoid air cells. Bilateral intra-ocular lens replacement. Other: None. IMPRESSION: 1. No acute intracranial abnormality. 2. Moderate chronic microvascular ischemic changes and moderate parenchymal volume loss of the brain. 3. Stable left parietal encephalomalacia and craniotomy. 4. Right maxillary sinus opacification. Electronically Signed   By: Kristine Garbe M.D.   On: 07/11/2017 15:05     Assessment 83 year old female with a neurologic history that includes dementia and TIA was noted to have an acute onset of slurred speech and expressive aphasia during physician rounding. Per family, patient has been having waxing and waning episode of expressive aphasia and slurred speech for several weeks, marked by an abrupt change in expression ?prior to the morning of 07/10/17.   We obtain MRI head which is negative for an acute stroke.  Lupita Raider PA-C [facility] [phone]  07/11/2017, 12:34 PM  NEUROHOSPITALIST ADDENDUM Seen and examined the patient this AM.  The patient was stroke alerted as the attending on service today noticed the patient had slurred speech and aphasia. According to family, the patient has had waxing and waning speech problems for a while however now significantly worse since yesterday morning. On examination the patient had expressive aphasia, paraphasic errors, left right confusion. Visual fields were mostly present on the left eye, for a right eye she has macular degeneration making exam unreliable.She had no extinction or hemi-neglect.  Old  CT showed left parietal encephalomalacia and craniotomy likely performed for  drainage of neurocysticercosis?? We obtained a stat MRI to rule out ischemic infarct given the fact that the patient has been in atrial fibrillation with RVR. MRI head was negative for any acute process, her symptoms are likely due to reactivation of her deficits from left parietal region. Her aphasia is likely worsened with the fact that she has dementia, has been hospitalized with acute metabolic issues. We recommend continuing anticoagulation.   Thanks for the consult. We will sign off.   Karena Addison Aroor MD [facility] [phone]  If 7pm to 7am, please call on call as listed on AMION.

## 2017-07-12 ENCOUNTER — Inpatient Hospital Stay (HOSPITAL_COMMUNITY): Payer: Medicare Other

## 2017-07-12 LAB — BASIC METABOLIC PANEL
ANION GAP: 8 (ref 5–15)
BUN: 28 mg/dL — ABNORMAL HIGH (ref 6–20)
CALCIUM: 8.8 mg/dL — AB (ref 8.9–10.3)
CO2: 20 mmol/L — AB (ref 22–32)
Chloride: 102 mmol/L (ref 101–111)
Creatinine, Ser: 1.46 mg/dL — ABNORMAL HIGH (ref 0.44–1.00)
GFR, EST AFRICAN AMERICAN: 36 mL/min — AB (ref >60)
GFR, EST NON AFRICAN AMERICAN: 31 mL/min — AB (ref >60)
Glucose, Bld: 129 mg/dL — ABNORMAL HIGH (ref 65–99)
POTASSIUM: 4.6 mmol/L (ref 3.5–5.1)
Sodium: 130 mmol/L — ABNORMAL LOW (ref 135–145)

## 2017-07-12 LAB — GLUCOSE, CAPILLARY: Glucose-Capillary: 135 mg/dL — ABNORMAL HIGH (ref 65–99)

## 2017-07-12 MED ORDER — ZOLPIDEM TARTRATE 5 MG PO TABS
5.0000 mg | ORAL_TABLET | Freq: Every day | ORAL | Status: AC
  Administered 2017-07-12: 5 mg via ORAL
  Filled 2017-07-12: qty 1

## 2017-07-12 MED ORDER — DILTIAZEM HCL 60 MG PO TABS
90.0000 mg | ORAL_TABLET | Freq: Three times a day (TID) | ORAL | Status: DC
  Administered 2017-07-12 (×2): 90 mg via ORAL
  Filled 2017-07-12 (×3): qty 1

## 2017-07-12 MED ORDER — TECHNETIUM TC 99M DIETHYLENETRIAME-PENTAACETIC ACID
31.0000 | Freq: Once | INTRAVENOUS | Status: AC
  Administered 2017-07-12: 31 via INTRAVENOUS

## 2017-07-12 MED ORDER — TECHNETIUM TO 99M ALBUMIN AGGREGATED
4.2000 | Freq: Once | INTRAVENOUS | Status: AC
  Administered 2017-07-12: 4.2 via INTRAVENOUS

## 2017-07-12 NOTE — Plan of Care (Signed)
Problem: Safety: Goal: Ability to remain free from injury will improve Outcome: Progressing Low bed with floor mat in place for patient safety. Patient has not attempted to exit the bed. Bed alarm set.

## 2017-07-12 NOTE — Progress Notes (Signed)
Progress Note  Patient Name: Brittney Gutierrez Date of Encounter: 07/12/2017  Primary Cardiologist: Yicel Shannon  Subjective   Remains slightly disoriented, but able to speak in relatively complex phrases coherently. She has some degree of baseline dementia. Her son is present in the room and believes that she has improved substantially compared to yesterday, but not fully recovered  Heart rate was 130 bpm all night long and what appears to be atrial flutter with 2:1 AV conduction and frequent PVCs, now greatly improved in atrial fibrillation with ventricular rate around 70-80 at rest.  Inpatient Medications    Scheduled Meds: . apixaban  2.5 mg Oral BID  . cholecalciferol  2,000 Units Oral Daily  . docusate sodium  100 mg Oral BID  . losartan  100 mg Oral Daily  . metoprolol tartrate  75 mg Oral BID  . sodium chloride flush  3 mL Intravenous Q12H  . venlafaxine  37.5 mg Oral BID   Continuous Infusions: . sodium chloride    . sodium chloride    . diltiazem (CARDIZEM) infusion 15 mg/hr (07/12/17 1008)   PRN Meds: acetaminophen, nitroGLYCERIN, ondansetron (ZOFRAN) IV, sodium chloride flush   Vital Signs    Vitals:   07/12/17 0404 07/12/17 0733 07/12/17 0827 07/12/17 1059  BP: 134/70 119/79 128/79   Pulse: (!) 130 (!) 132 (!) 127   Resp: (!) _0 Temp: 97.7 F (36.5 C) 97.8 F (36.6 C)    TempSrc: Oral Tympanic    SpO2: 93% 95% 96%   Weight: 130 lb (59 kg)     Height:        Intake/Output Summary (Last 24 hours) at 07/12/17 1138 Last data filed at 07/12/17 1112  Gross per 24 hour  Intake           512.88 ml  Output              200 ml  Net           312.88 ml   Filed Weights   07/11/17 0034 07/11/17 0541 07/12/17 0404  Weight: 130 lb (59 kg) 129 lb 14.4 oz (58.9 kg) 130 lb (59 kg)    Telemetry    Atrial flutter 2:1 AV conduction, frequent PVCs, transition to atrial fibrillation with controlled ventricular rate around 8 AM - Personally Reviewed  ECG    Atrial fibrillation with rapid ventricular response and PVCs, diffuse ST segment depression most obvious in leads V4-V6, 2, 3, aVF - Personally Reviewed  Physical Exam  Looks very comfortable at rest, alert to self and location, not to time. Recognizes family, does not recognize me even though we met in clinic as recently as 2 months ago. GEN: No acute distress.   Neck: No JVD Cardiac:  irregular, no murmurs, rubs, or gallops.  Respiratory: Clear to auscultation bilaterally. GI: Soft, nontender, non-distended  MS: No edema; No deformity. Neuro:  Nonfocal  Psych: Normal affect   Labs    Chemistry Recent Labs Lab 07/10/17 0303 07/11/17 0233 07/12/17 0403  NA 134* 131* 130*  K 3.3* 4.1 4.6  CL 98* 100* 102  CO2 24 23 20*  GLUCOSE 102* 113* 129*  BUN 26* 29* 28*  CREATININE 1.28* 1.70* 1.46*  CALCIUM 8.8* 8.6* 8.8*  GFRNONAA 36* 26* 31*  GFRAA 42* 30* 36*  ANIONGAP _1 Hematology Recent Labs Lab 07/09/17 1321  WBC 9.8  RBC 3.98  HGB 11.4*  13.9  HCT 34.2*  41.0  MCV 85.9  MCH 28.6  MCHC 33.3  RDW 13.8  PLT 242    Cardiac EnzymesNo results for input(s): TROPONINI in the last 168 hours.  Recent Labs Lab 07/09/17 1319  TROPIPOC 0.02     BNPNo results for input(s): BNP, PROBNP in the last 168 hours.   DDimer  Recent Labs Lab 07/11/17 1611  DDIMER 0.75*     Radiology    Mr Brain Wo Contrast  Result Date: 07/11/2017 CLINICAL DATA:  83 y/o  F; altered mental status of unclear cause. EXAM: MRI HEAD WITHOUT CONTRAST TECHNIQUE: Multiplanar, multiecho pulse sequences of the brain and surrounding structures were obtained without intravenous contrast. COMPARISON:  12/26/2015 CT head FINDINGS: Extensive motion degradation on multiple sequences. Brain: No acute infarction, hemorrhage, hydrocephalus, extra-axial collection or mass lesion. Small focus of encephalomalacia in the left lateral parietal lobe. Moderate chronic microvascular ischemic changes of  white matter and parenchymal volume loss of the brain. Vascular: Normal flow voids. Skull and upper cervical spine: Susceptibility artifact from surgical changes related to left parietal craniotomy. Sinuses/Orbits: Right maxillary sinus opacification. No abnormal signal of mastoid air cells. Bilateral intra-ocular lens replacement. Other: None. IMPRESSION: 1. No acute intracranial abnormality. 2. Moderate chronic microvascular ischemic changes and moderate parenchymal volume loss of the brain. 3. Stable left parietal encephalomalacia and craniotomy. 4. Right maxillary sinus opacification. Electronically Signed   By: Kristine Garbe M.D.   On: 07/11/2017 15:05    Cardiac Studies   ECHO 07/11/2017  - Left ventricle: The cavity size was normal. Wall thickness was   increased in a pattern of moderate LVH. Systolic function was   normal. The estimated ejection fraction was in the range of 50%   to 55%. Wall motion was normal; there were no regional wall   motion abnormalities. The study is not technically sufficient to   allow evaluation of LV diastolic function. - Aortic valve: There was mild regurgitation. - Mitral valve: Calcified annulus. There was mild regurgitation. - Left atrium: The atrium was moderately dilated. - Pulmonary arteries: Systolic pressure was mildly to moderately   increased. PA peak pressure: 48 mm Hg (S).  Impressions:  - Normal LV systolic function; moderate LVH; sclerotic aortic valve   with mild AI; mild MR; moderate LAE; mild TR with mild to   moderate elevation in pulmonary pressure.  Patient Profile     83 y.o. female with long-standing problems with tachycardia-bradycardia syndrome, paroxysmal atrial fibrillation, offered pacemaker but preferred conservative management, presents with disorientation, sleep disturbance and atrial flutter/atrial fibrillation with rapid ventricular response.  Assessment & Plan    1. AFib: She was in atrial flutter with  2:1 AV conduction and very rapid ventricular rate. Rate control is greatly improved now that she is in atrial fibrillation and with this there seems to be substantial improvement in her mental status and overall clinical condition. She is on appropriate oral anticoagulation, but missed several doses over the last couple of weeks. Plan is to go ahead with TEE guided cardioversion tomorrow. In the past, treatment with beta blockers and calcium channel blocker was not tolerated due to sinus bradycardia. Currently on diltiazem 15 mg/hour intravenously. We'll switch to an equivalent dose of immediate release oral diltiazem that we can discontinue fairly quickly if she converts to normal rhythm either spontaneously or following cardioversion. She has a questionable hx of TIA, MRI shows evidence of left parietal encephalomalacia following craniotomy as well as some moderate chronic microvascular ischemic changes, with no acute  or large ischemic stroke. She has a CHA2DS2Vasc score of at least 4 (age 68, gender, hypertension ; if + TIA would be 6).  Long-term, only good solution for the rhythm problem remains a pacemaker which would then offer her the ability to use rate control medications without concern for syncope and symptomatic bradycardia. Will review this with the patient and family over the weekend. Since we initially discussed pacemaker implantation she has evidence of progression of her dementia, which will likely play a role in the decision process. 2. PAH: Mild worsening of baseline pulmonary hypertension is likely attributable to worsen diastolic left ventricular function in the setting of rapid arrhythmia. D-dimer is borderline elevated. There are no clinical signs that would suggest pulmonary embolism or DVT. Obstructive sleep apnea is worth considering, but I don't think we need to be worried about an acute pulmonary embolism. She needs to be on anticoagulation anyway. 3, CHF: Acute exacerbation of  diastolic heart failure related to arrhythmia, rapidly improving. 4. HTN: Well controlled. Continue  metoprolol, losartan and replace amlodipine with immediate release diltiazem.  Signed, Sanda Klein, MD  07/12/2017, 11:38 AM

## 2017-07-13 LAB — BASIC METABOLIC PANEL
ANION GAP: 9 (ref 5–15)
BUN: 32 mg/dL — ABNORMAL HIGH (ref 6–20)
CO2: 19 mmol/L — AB (ref 22–32)
Calcium: 8.9 mg/dL (ref 8.9–10.3)
Chloride: 98 mmol/L — ABNORMAL LOW (ref 101–111)
Creatinine, Ser: 1.76 mg/dL — ABNORMAL HIGH (ref 0.44–1.00)
GFR, EST AFRICAN AMERICAN: 29 mL/min — AB (ref >60)
GFR, EST NON AFRICAN AMERICAN: 25 mL/min — AB (ref >60)
Glucose, Bld: 134 mg/dL — ABNORMAL HIGH (ref 65–99)
Potassium: 4.6 mmol/L (ref 3.5–5.1)
SODIUM: 126 mmol/L — AB (ref 135–145)

## 2017-07-13 MED ORDER — SODIUM CHLORIDE 0.9 % IV SOLN: 250.0000 mL | INTRAVENOUS | Status: DC

## 2017-07-13 MED ORDER — SODIUM CHLORIDE 0.9% FLUSH
3.0000 mL | Freq: Two times a day (BID) | INTRAVENOUS | Status: DC
  Administered 2017-07-13 – 2017-07-17 (×7): 3 mL via INTRAVENOUS

## 2017-07-13 MED ORDER — SODIUM CHLORIDE 0.9% FLUSH: 3.0000 mL | INTRAVENOUS | Status: DC | PRN

## 2017-07-13 MED ORDER — DILTIAZEM LOAD VIA INFUSION
15.0000 mg | Freq: Once | INTRAVENOUS | Status: AC
  Administered 2017-07-13: 15 mg via INTRAVENOUS
  Filled 2017-07-13: qty 15

## 2017-07-13 MED ORDER — DEXTROSE 5 % IV SOLN
5.0000 mg/h | INTRAVENOUS | Status: DC
  Administered 2017-07-13: 15 mg/h via INTRAVENOUS
  Administered 2017-07-13: 5 mg/h via INTRAVENOUS
  Administered 2017-07-13 – 2017-07-14 (×3): 15 mg/h via INTRAVENOUS
  Filled 2017-07-13 (×5): qty 100

## 2017-07-13 NOTE — Progress Notes (Addendum)
Progress Note  Patient Name: Brittney Gutierrez Date of Encounter: 07/13/2017  Primary Cardiologist: Rosevelt Luu  Subjective   Received a sedative last night and was not awake enough to swallow her medications. Agitated and confused this afternoon, did not recognize her son, calm down when her daughter arrived. Is mostly in atrial fibrillation with excellent ventricular rate control yesterday, throughout the night she has been in atrial flutter with 2:1 AV block and heart rates of 140-1 150 bpm. Remains in a restless sleep and cannot wake up enough to swallow medications or really interact.  Talked more about the events leading up to this admission with the patient's daughter, Wille Glaser. She simultaneously had an adjustment in her dementia medications and received antibiotics (Bactrim) for urinary tract infection. There was a substantial deterioration in her cognitive status. She missed several doses of medications. Some of her medications were found scattered on the floor. The day before admission she was found naked sitting on the floor of her kitchen.  Inpatient Medications    Scheduled Meds: . apixaban  2.5 mg Oral BID  . cholecalciferol  2,000 Units Oral Daily  . diltiazem  90 mg Oral Q8H  . docusate sodium  100 mg Oral BID  . losartan  100 mg Oral Daily  . metoprolol tartrate  75 mg Oral BID  . sodium chloride flush  3 mL Intravenous Q12H  . venlafaxine  37.5 mg Oral BID   Continuous Infusions: . sodium chloride    . sodium chloride     PRN Meds: acetaminophen, nitroGLYCERIN, ondansetron (ZOFRAN) IV, sodium chloride flush   Vital Signs    Vitals:   07/12/17 2129 07/12/17 2332 07/13/17 0515 07/13/17 0743  BP: 127/69 (!) 130/106 114/69 (!) 136/123  Pulse: (!) 133 (!) 123 (!) 138 (!) 133  Resp: (!) 25 (!) 21 (!) 23 (!) 27  Temp: 98.9 F (37.2 C) 98.7 F (37.1 C) 99.4 F (37.4 C) 98.9 F (37.2 C)  TempSrc: Axillary Axillary Axillary Axillary  SpO2: 91% 90% 93% 94%  Weight:    143 lb (64.9 kg)   Height:        Intake/Output Summary (Last 24 hours) at 07/13/17 0942 Last data filed at 07/13/17 0900  Gross per 24 hour  Intake              500 ml  Output              500 ml  Net                0 ml   Filed Weights   07/11/17 0541 07/12/17 0404 07/13/17 0515  Weight: 129 lb 14.4 oz (58.9 kg) 130 lb (59 kg) 143 lb (64.9 kg)    Telemetry    Mostly atrial flutter with 2:1 AV block, occasional higher degree block, occasional PVCs - Personally Reviewed  ECG    No new tracing - Personally Reviewed  Physical Exam  Gen: Asleep, difficult to awake for more than a second at a time    Neck: No JVD Cardiac:  irregular, no murmurs, rubs, or gallops.  Respiratory: Clear to auscultation bilaterally. GI: Soft, nontender, non-distended  MS: No edema; No deformity. Neuro:  Nonfocal  Psych: Normal affect   Labs    Chemistry Recent Labs Lab 07/11/17 0233 07/12/17 0403 07/13/17 0640  NA 131* 130* 126*  K 4.1 4.6 4.6  CL 100* 102 98*  CO2 23 20* 19*  GLUCOSE 113* 129* 134*  BUN 29* 28*  32*  CREATININE 1.70* 1.46* 1.76*  CALCIUM 8.6* 8.8* 8.9  GFRNONAA 26* 31* 25*  GFRAA 30* 36* 29*  ANIONGAP 8 8 9      Hematology Recent Labs Lab 07/09/17 1321  WBC 9.8  RBC 3.98  HGB 11.4*  13.9  HCT 34.2*  41.0  MCV 85.9  MCH 28.6  MCHC 33.3  RDW 13.8  PLT 242    Cardiac EnzymesNo results for input(s): TROPONINI in the last 168 hours.  Recent Labs Lab 07/09/17 1319  TROPIPOC 0.02     BNPNo results for input(s): BNP, PROBNP in the last 168 hours.   DDimer  Recent Labs Lab 07/11/17 1611  DDIMER 0.75*     Radiology    Dg Chest 2 View  Result Date: 07/12/2017 CLINICAL DATA:  Hx of V/Q scan.  Positive D-dimer. EXAM: CHEST  2 VIEW COMPARISON:  12/05/2016 FINDINGS: The heart is enlarged. There is prominence of interstitial markings. Airspace filling opacities are identified at the bases, left greater than right. There are small bilateral pleural  effusions.There is atherosclerotic calcification of the thoracic aorta. Chronic changes in the right shoulder compatible with chronic rotator cuff injury. IMPRESSION: Findings most compatible with congestive heart failure. Superimposed infectious process could have a similar appearance. Electronically Signed   By: Nolon Nations M.D.   On: 07/12/2017 17:35   Mr Brain Wo Contrast  Result Date: 07/11/2017 CLINICAL DATA:  83 y/o  F; altered mental status of unclear cause. EXAM: MRI HEAD WITHOUT CONTRAST TECHNIQUE: Multiplanar, multiecho pulse sequences of the brain and surrounding structures were obtained without intravenous contrast. COMPARISON:  12/26/2015 CT head FINDINGS: Extensive motion degradation on multiple sequences. Brain: No acute infarction, hemorrhage, hydrocephalus, extra-axial collection or mass lesion. Small focus of encephalomalacia in the left lateral parietal lobe. Moderate chronic microvascular ischemic changes of white matter and parenchymal volume loss of the brain. Vascular: Normal flow voids. Skull and upper cervical spine: Susceptibility artifact from surgical changes related to left parietal craniotomy. Sinuses/Orbits: Right maxillary sinus opacification. No abnormal signal of mastoid air cells. Bilateral intra-ocular lens replacement. Other: None. IMPRESSION: 1. No acute intracranial abnormality. 2. Moderate chronic microvascular ischemic changes and moderate parenchymal volume loss of the brain. 3. Stable left parietal encephalomalacia and craniotomy. 4. Right maxillary sinus opacification. Electronically Signed   By: Kristine Garbe M.D.   On: 07/11/2017 15:05   Nm Pulmonary Perf And Vent  Result Date: 07/12/2017 CLINICAL DATA:  Suspected pulmonary embolus.  Positive D-dimer. EXAM: NUCLEAR MEDICINE VENTILATION - PERFUSION LUNG SCAN TECHNIQUE: Ventilation images were obtained in multiple projections using inhaled aerosol Tc-77m DTPA. Perfusion images were obtained in  multiple projections after intravenous injection of Tc-70m MAA. RADIOPHARMACEUTICALS:  31.0 MCi Technetium-93m DTPA aerosol inhalation and 4.2 mCi Technetium-33m MAA IV COMPARISON:  Chest x-ray 07/12/2017 FINDINGS: Ventilation: There is patchy ventilation, consistent with airspace disease is seen on chest x-ray. Perfusion: No wedge shaped peripheral perfusion defects to suggest acute pulmonary embolism. Additional:  The patient is scanned with arms at her side. IMPRESSION: 1. Low probability for clinically significant pulmonary embolus. 2. Patchy ventilation, consistent with airspace filling/edema. Electronically Signed   By: Nolon Nations M.D.   On: 07/12/2017 16:58    Cardiac Studies    ECHO 07/11/2017  - Left ventricle: The cavity size was normal. Wall thickness was increased in a pattern of moderate LVH. Systolic function was normal. The estimated ejection fraction was in the range of 50% to 55%. Wall motion was normal; there were no regional  wall motion abnormalities. The study is not technically sufficient to allow evaluation of LV diastolic function. - Aortic valve: There was mild regurgitation. - Mitral valve: Calcified annulus. There was mild regurgitation. - Left atrium: The atrium was moderately dilated. - Pulmonary arteries: Systolic pressure was mildly to moderately increased. PA peak pressure: 48 mm Hg (S).  Impressions:  - Normal LV systolic function; moderate LVH; sclerotic aortic valve with mild AI; mild MR; moderate LAE; mild TR with mild to moderate elevation in pulmonary pressure.  Patient Profile     83 y.o. female with long-standing problems with tachycardia-bradycardia syndrome, paroxysmal atrial fibrillation (offered pacemaker but preferred conservative management), presents with disorientation, sleep disturbance and atrial flutter/atrial fibrillation with rapid ventricular response, Preceded by a change in medications for dementia and an  urinary tract infection  Assessment & Plan    1. AFib: Back to more organized pattern consistent with atrial flutter and worsened ventricular rate control. She is too sleepy to take oral medications. Will resume diltiazem. Still scheduled for TEE guided cardioversion tomorrow.CHA2DS2Vasc score of at least 4 (age 3, gender, hypertension ; if + TIA would be 6).  On anticoagulant, but probably missed several doses before this admission. 2. History of bradycardia: After cardioversion one may have to deal with issues of sinus bradycardia. Until we clarify her cognitive issues, still best to avoid pacemaker implantation, probably. Will consult with family every step of the way. 3. CHF: no change in LV systolic function. Suspect exacerbation related to rapid ventricular rate. 4. HTN: Fair control despite inability to take oral meds. 5. Dementia: Acute deterioration related to acute medical illness, also to changes in psychotropic meds. I would avoid all future sedatives. Consider resuming her previous treatment with Aricept and Namenda, although both these medications can worsen her tendency to bradycardia she seemed to do better on that. 6. Acute on chronic renal insufficiency: GFR approximately 30, monitor closely to see if we need to adjust doses of medications that are renally excreted. Note significant hyponatremia today. Diuretics are on hold. Oral intake is poor.  Signed, Sanda Klein, MD  07/13/2017, 9:42 AM

## 2017-07-13 NOTE — Progress Notes (Signed)
Scheduled 6:00 AM dose of Cardizem not given because patient received Ambien 5 mg at HS 07/12/2017 and is having a hard time staying awake.  She opens her eyes when you call her name and falls asleep shortly.  Heart rate rate remains high sometimes in the 140's.  Report given to incoming nurse.

## 2017-07-14 ENCOUNTER — Inpatient Hospital Stay (HOSPITAL_COMMUNITY): Payer: Medicare Other | Admitting: Anesthesiology

## 2017-07-14 ENCOUNTER — Encounter (HOSPITAL_COMMUNITY)
Admission: EM | Disposition: A | Discharge status: Home Health | Payer: Self-pay | Source: Home / Self Care | Attending: Cardiovascular Disease

## 2017-07-14 ENCOUNTER — Telehealth: Payer: Self-pay | Admitting: *Deleted

## 2017-07-14 ENCOUNTER — Encounter (HOSPITAL_COMMUNITY): Payer: Self-pay | Admitting: *Deleted

## 2017-07-14 ENCOUNTER — Inpatient Hospital Stay (HOSPITAL_COMMUNITY): Payer: Medicare Other

## 2017-07-14 DIAGNOSIS — I5033 Acute on chronic diastolic (congestive) heart failure: Secondary | ICD-10-CM

## 2017-07-14 DIAGNOSIS — F0150 Vascular dementia without behavioral disturbance: Secondary | ICD-10-CM

## 2017-07-14 DIAGNOSIS — I4891 Unspecified atrial fibrillation: Secondary | ICD-10-CM

## 2017-07-14 DIAGNOSIS — I340 Nonrheumatic mitral (valve) insufficiency: Secondary | ICD-10-CM

## 2017-07-14 HISTORY — PX: CARDIOVERSION: SHX1299

## 2017-07-14 HISTORY — PX: TEE WITHOUT CARDIOVERSION: SHX5443

## 2017-07-14 LAB — BASIC METABOLIC PANEL
ANION GAP: 8 (ref 5–15)
BUN: 31 mg/dL — ABNORMAL HIGH (ref 6–20)
CHLORIDE: 102 mmol/L (ref 101–111)
CO2: 20 mmol/L — AB (ref 22–32)
Calcium: 8.9 mg/dL (ref 8.9–10.3)
Creatinine, Ser: 1.47 mg/dL — ABNORMAL HIGH (ref 0.44–1.00)
GFR calc non Af Amer: 31 mL/min — ABNORMAL LOW (ref >60)
GFR, EST AFRICAN AMERICAN: 36 mL/min — AB (ref >60)
Glucose, Bld: 116 mg/dL — ABNORMAL HIGH (ref 65–99)
Potassium: 4.3 mmol/L (ref 3.5–5.1)
Sodium: 130 mmol/L — ABNORMAL LOW (ref 135–145)

## 2017-07-14 LAB — PROTIME-INR
INR: 1.34
Prothrombin Time: 16.7 seconds — ABNORMAL HIGH (ref 11.4–15.2)

## 2017-07-14 SURGERY — ECHOCARDIOGRAM, TRANSESOPHAGEAL
Anesthesia: General

## 2017-07-14 MED ORDER — MAGNESIUM HYDROXIDE 400 MG/5ML PO SUSP
30.0000 mL | Freq: Every day | ORAL | Status: DC | PRN
  Administered 2017-07-15: 30 mL via ORAL
  Filled 2017-07-14 (×2): qty 30

## 2017-07-14 MED ORDER — CALCIUM CHLORIDE 10 % IV SOLN
INTRAVENOUS | Status: DC | PRN
  Administered 2017-07-14: 0.5 g via INTRAVENOUS

## 2017-07-14 MED ORDER — SODIUM CHLORIDE 0.9 % IV SOLN: INTRAVENOUS | Status: DC

## 2017-07-14 MED ORDER — DONEPEZIL HCL 5 MG PO TABS
5.0000 mg | ORAL_TABLET | Freq: Every day | ORAL | Status: DC
  Administered 2017-07-14: 5 mg via ORAL
  Filled 2017-07-14: qty 1

## 2017-07-14 MED ORDER — PROPOFOL 500 MG/50ML IV EMUL
INTRAVENOUS | Status: DC | PRN
  Administered 2017-07-14: 50 ug/kg/min via INTRAVENOUS

## 2017-07-14 MED ORDER — GLYCOPYRROLATE 0.2 MG/ML IJ SOLN
INTRAMUSCULAR | Status: DC | PRN
  Administered 2017-07-14: 0.4 mg via INTRAVENOUS

## 2017-07-14 MED ORDER — LIDOCAINE VISCOUS 2 % MT SOLN
OROMUCOSAL | Status: AC
  Filled 2017-07-14: qty 15

## 2017-07-14 MED ORDER — SODIUM CHLORIDE 0.9 % IV SOLN
1.0000 g | Freq: Once | INTRAVENOUS | Status: DC
  Filled 2017-07-14 (×2): qty 10

## 2017-07-14 MED ORDER — LIDOCAINE HCL (CARDIAC) 20 MG/ML IV SOLN
INTRAVENOUS | Status: DC | PRN
  Administered 2017-07-14: 50 mg via INTRAVENOUS

## 2017-07-14 MED ORDER — LIDOCAINE VISCOUS 2 % MT SOLN
OROMUCOSAL | Status: DC | PRN
  Administered 2017-07-14: 10 mL via OROMUCOSAL

## 2017-07-14 MED ORDER — PHENYLEPHRINE 40 MCG/ML (10ML) SYRINGE FOR IV PUSH (FOR BLOOD PRESSURE SUPPORT)
PREFILLED_SYRINGE | INTRAVENOUS | Status: DC | PRN
  Administered 2017-07-14 (×3): 80 ug via INTRAVENOUS

## 2017-07-14 MED ORDER — EPHEDRINE SULFATE-NACL 50-0.9 MG/10ML-% IV SOSY
PREFILLED_SYRINGE | INTRAVENOUS | Status: DC | PRN
  Administered 2017-07-14 (×3): 10 mg via INTRAVENOUS

## 2017-07-14 NOTE — Progress Notes (Signed)
Patient more alert during the night. Took scheduled medications and ate apple sauce and chocolate pudding without difficulties.

## 2017-07-14 NOTE — Telephone Encounter (Signed)
Spoke with daughter [name] and the cardiologist will restart the Aricept.

## 2017-07-14 NOTE — Progress Notes (Signed)
Progress Note  Patient Name: Brittney Gutierrez Date of Encounter: 07/14/2017  Primary Cardiologist: Kyarah Enamorado  Subjective   Substantially improved from a cognitive point of view, compared to yesterday. No longer disoriented, much more alert Her daughters state that her cognitive status declined substantially when her medications were changed. They think she did a lot better when she was taking Aricept. Remains in atrial fibrillation with variable rate response, rates as low as 70 and as high as 140. Denies dyspnea or angina. Creatinine trending back down  Inpatient Medications    Scheduled Meds: . apixaban  2.5 mg Oral BID  . cholecalciferol  2,000 Units Oral Daily  . docusate sodium  100 mg Oral BID  . losartan  100 mg Oral Daily  . metoprolol tartrate  75 mg Oral BID  . sodium chloride flush  3 mL Intravenous Q12H  . sodium chloride flush  3 mL Intravenous Q12H  . venlafaxine  37.5 mg Oral BID   Continuous Infusions: . sodium chloride    . sodium chloride    . sodium chloride 20 mL/hr at 07/14/17 1205  . sodium chloride    . diltiazem (CARDIZEM) infusion 15 mg/hr (07/14/17 0720)   PRN Meds: acetaminophen, nitroGLYCERIN, ondansetron (ZOFRAN) IV, sodium chloride flush, sodium chloride flush   Vital Signs    Vitals:   07/13/17 2052 07/14/17 0038 07/14/17 0325 07/14/17 0813  BP: 135/79 121/90 (!) 147/62 108/67  Pulse: (!) 124 100 87 93  Resp: 19 20 19 19   Temp: 98.5 F (36.9 C) 98.2 F (36.8 C) 98.6 F (37 C) 98.4 F (36.9 C)  TempSrc: Axillary Axillary Axillary Axillary  SpO2: 90% 97% 93% 94%  Weight:   148 lb (67.1 kg)   Height:        Intake/Output Summary (Last 24 hours) at 07/14/17 1225 Last data filed at 07/14/17 0328  Gross per 24 hour  Intake           429.42 ml  Output                0 ml  Net           429.42 ml   Filed Weights   07/12/17 0404 07/13/17 0515 07/14/17 0325  Weight: 130 lb (59 kg) 143 lb (64.9 kg) 148 lb (67.1 kg)    Telemetry      Atrial fibrillation with rapid ventricular response - Personally Reviewed  ECG    No new tracing - Personally Reviewed  Physical Exam  Calm and relaxed, oriented to her daughters and herself, does not seem really sure about her location GEN: No acute distress.   Neck: No JVD Cardiac: RRR, no murmurs, rubs, or gallops.  Respiratory: Clear to auscultation bilaterally. GI: Soft, nontender, non-distended  MS: No edema; No deformity. Neuro:  Nonfocal  Psych: Normal affect   Labs    Chemistry Recent Labs Lab 07/12/17 0403 07/13/17 0640 07/14/17 0633  NA 130* 126* 130*  K 4.6 4.6 4.3  CL 102 98* 102  CO2 20* 19* 20*  GLUCOSE 129* 134* 116*  BUN 28* 32* 31*  CREATININE 1.46* 1.76* 1.47*  CALCIUM 8.8* 8.9 8.9  GFRNONAA 31* 25* 31*  GFRAA 36* 29* 36*  ANIONGAP 8 9 8      Hematology Recent Labs Lab 07/09/17 1321  WBC 9.8  RBC 3.98  HGB 11.4*  13.9  HCT 34.2*  41.0  MCV 85.9  MCH 28.6  MCHC 33.3  RDW 13.8  PLT 242  Cardiac EnzymesNo results for input(s): TROPONINI in the last 168 hours.  Recent Labs Lab 07/09/17 1319  TROPIPOC 0.02     BNPNo results for input(s): BNP, PROBNP in the last 168 hours.   DDimer  Recent Labs Lab 07/11/17 1611  DDIMER 0.75*     Radiology    Dg Chest 2 View  Result Date: 07/12/2017 CLINICAL DATA:  Hx of V/Q scan.  Positive D-dimer. EXAM: CHEST  2 VIEW COMPARISON:  12/05/2016 FINDINGS: The heart is enlarged. There is prominence of interstitial markings. Airspace filling opacities are identified at the bases, left greater than right. There are small bilateral pleural effusions.There is atherosclerotic calcification of the thoracic aorta. Chronic changes in the right shoulder compatible with chronic rotator cuff injury. IMPRESSION: Findings most compatible with congestive heart failure. Superimposed infectious process could have a similar appearance. Electronically Signed   By: Nolon Nations M.D.   On: 07/12/2017 17:35    Nm Pulmonary Perf And Vent  Result Date: 07/12/2017 CLINICAL DATA:  Suspected pulmonary embolus.  Positive D-dimer. EXAM: NUCLEAR MEDICINE VENTILATION - PERFUSION LUNG SCAN TECHNIQUE: Ventilation images were obtained in multiple projections using inhaled aerosol Tc-48m DTPA. Perfusion images were obtained in multiple projections after intravenous injection of Tc-77m MAA. RADIOPHARMACEUTICALS:  31.0 MCi Technetium-66m DTPA aerosol inhalation and 4.2 mCi Technetium-50m MAA IV COMPARISON:  Chest x-ray 07/12/2017 FINDINGS: Ventilation: There is patchy ventilation, consistent with airspace disease is seen on chest x-ray. Perfusion: No wedge shaped peripheral perfusion defects to suggest acute pulmonary embolism. Additional:  The patient is scanned with arms at her side. IMPRESSION: 1. Low probability for clinically significant pulmonary embolus. 2. Patchy ventilation, consistent with airspace filling/edema. Electronically Signed   By: Nolon Nations M.D.   On: 07/12/2017 16:58    Cardiac Studies   ECHO 07/11/2017  - Left ventricle: The cavity size was normal. Wall thickness was increased in a pattern of moderate LVH. Systolic function was normal. The estimated ejection fraction was in the range of 50% to 55%. Wall motion was normal; there were no regional wall motion abnormalities. The study is not technically sufficient to allow evaluation of LV diastolic function. - Aortic valve: There was mild regurgitation. - Mitral valve: Calcified annulus. There was mild regurgitation. - Left atrium: The atrium was moderately dilated. - Pulmonary arteries: Systolic pressure was mildly to moderately increased. PA peak pressure: 48 mm Hg (S).  Impressions:  - Normal LV systolic function; moderate LVH; sclerotic aortic valve with mild AI; mild MR; moderate LAE; mild TR with mild to moderate elevation in pulmonary pressure.  Patient Profile     83 y.o. female with long-standing  problems with tachycardia-bradycardia syndrome, paroxysmal atrial fibrillation (offered pacemaker but preferred conservative management), presents with disorientation, sleep disturbance and atrial flutter/atrial fibrillation with rapid ventricular response, Preceded by a change in medications for dementia and an urinary tract infection  Assessment & Plan    1. AFib: Rate control remains elusive. Scheduled for cardioversion later today. May have problems with bradycardia after return to normal rhythm. She did not tolerate amiodarone in the past due to severe pruritus. Only other option might the dofetilide. QT interval difficult to assess with her irregular rhythm, probably around 450 ms. Recheck QT interval when in sinus rhythm. Creatinine clearance approximately 30. Marland KitchenCHA2DS2Vasc score of at least 4(age 62, gender, hypertension ; if + TIA would be 6).  On anticoagulant, but probably missed several doses before this admission. 2. History of bradycardia: After cardioversion one may have to deal  with issues of sinus bradycardia. Until we clarify her cognitive issues, still best to avoid pacemaker implantation, probably. Will consult with family every step of the way. For the same reason, we'll delay switching back to Aricept until we see what her heart rate does 3. CHF: no change in LV systolic function. Suspect exacerbation related to rapid ventricular rate. 4. HTN: Well controlled 5. Dementia:  Lan to restart Aricept 10 mg daily if heart rate allows 6. Acute on chronic renal insufficiency: GFR approximately 30, monitor closely to see if we need to adjust doses of medications that are renally excreted. Baseline GFR usually around 50. Hyponatremia has improved since yesterday. Diuretics are on hold. Oral intake is poor.  Signed, Sanda Klein, MD  07/14/2017, 12:25 PM

## 2017-07-14 NOTE — H&P (Signed)
    INTERVAL PROCEDURE H&P  History and Physical Interval Note:  07/14/2017 1:27 PM  Brittney Gutierrez has presented today for their planned procedure. The various methods of treatment have been discussed with the patient and family. After consideration of risks, benefits and other options for treatment, the patient has consented to the procedure.  The patients' outpatient history has been reviewed, patient examined, and no change in status from most recent office note within the past 30 days. I have reviewed the patients' chart and labs and will proceed as planned. Questions were answered to the patient's satisfaction.   Pixie Casino, MD, [place]  Attending Cardiologist  Direct Dial: [phone]  Fax: [phone]  Website:  [email or web address]  Brittney Gutierrez 07/14/2017, 1:27 PM

## 2017-07-14 NOTE — Progress Notes (Signed)
PT Cancellation Note  Patient Details Name: Brittney Gutierrez MRN: [id number] DOB: 1927-07-11   Cancelled Treatment:    Reason Eval/Treat Not Completed: Patient at procedure or test/unavailable Pt off floor at endo. Will follow up as time allows.   [place] 07/14/2017, 1:38 PM Wray Kearns, [place], DPT [phone]

## 2017-07-14 NOTE — Telephone Encounter (Signed)
Fine with me.  I wonder which dose they're giving since she was off and on and off and on so many times.  I would restart with 5mg  daily for 1 week, then 10mg  daily after that.

## 2017-07-14 NOTE — Anesthesia Preprocedure Evaluation (Addendum)
Anesthesia Evaluation  Patient identified by MRN, date of birth, ID band Patient confused    Reviewed: Allergy & Precautions, NPO status , Patient's Chart, lab work & pertinent test results  Airway Mallampati: II  TM Distance: >3 FB Neck ROM: Full    Dental no notable dental hx.    Pulmonary neg pulmonary ROS,    Pulmonary exam normal breath sounds clear to auscultation       Cardiovascular hypertension, Pt. on medications and Pt. on home beta blockers Normal cardiovascular exam+ dysrhythmias Atrial Fibrillation  Rhythm:Regular Rate:Normal  ECG: A-Fib, rate 73  Prox RCA lesion, 30% stenosed. Ost LM lesion, 20% stenosed. Mid Cx lesion, 30% stenosed.   1. Patent coronary arteries with mild diffuse coronary artery disease 2. Mild to moderate right renal artery stenosis, widely patent left renal artery 3. Normal LVEDP  Normal LV systolic function; moderate LVH; sclerotic aortic valve with mild AI; mild MR; moderate LAE; mild TR with mild to moderate elevation in pulmonary pressure.   Neuro/Psych PSYCHIATRIC DISORDERS Anxiety Depression Dementia  TIA   GI/Hepatic Neg liver ROS,   Endo/Other  negative endocrine ROS  Renal/GU negative Renal ROS     Musculoskeletal negative musculoskeletal ROS (+)   Abdominal   Peds  Hematology  (+) anemia ,   Anesthesia Other Findings   Reproductive/Obstetrics                            Anesthesia Physical Anesthesia Plan  ASA: IV  Anesthesia Plan: General   Post-op Pain Management:    Induction: Intravenous  PONV Risk Score and Plan: 3 and Propofol infusion and Treatment may vary due to age or medical condition  Airway Management Planned:   Additional Equipment:   Intra-op Plan:   Post-operative Plan:   Informed Consent: I have reviewed the patients History and Physical, chart, labs and discussed the procedure including the risks, benefits  and alternatives for the proposed anesthesia with the patient or authorized representative who has indicated his/her understanding and acceptance.   Dental advisory given  Plan Discussed with: CRNA  Anesthesia Plan Comments:       Anesthesia Quick Evaluation

## 2017-07-14 NOTE — Progress Notes (Signed)
  Echocardiogram Transesophageal has been performed.  Brittney Gutierrez 07/14/2017, 3:10 PM

## 2017-07-14 NOTE — Transfer of Care (Signed)
Immediate Anesthesia Transfer of Care Note  Patient: Brittney Gutierrez  Procedure(s) Performed: Procedure(s): TRANSESOPHAGEAL ECHOCARDIOGRAM (TEE) (N/A) CARDIOVERSION (N/A)  Patient Location: PACU  Anesthesia Type:MAC  Level of Consciousness: awake and alert   Airway & Oxygen Therapy: Patient Spontanous Breathing and Patient connected to nasal cannula oxygen  Post-op Assessment: Report given to RN and Post -op Vital signs reviewed and stable  Post vital signs: Reviewed and stable  Last Vitals:  Vitals:   07/14/17 1340 07/14/17 1352  BP: 99/70   Pulse:  (!) 148  Resp:  (!) 21  Temp: 36.4 C     Last Pain:  Vitals:   07/14/17 1340  TempSrc: Oral  PainSc:       Patients Stated Pain Goal: 0 (68/12/75 1700)  Complications: No apparent anesthesia complications

## 2017-07-14 NOTE — CV Procedure (Signed)
TEE/CARDIOVERSION NOTE  TRANSESOPHAGEAL ECHOCARDIOGRAM (TEE):  Indictation: Atrial Fibrillation  Consent:   Informed consent was obtained prior to the procedure. The risks, benefits and alternatives for the procedure were discussed and the patient comprehended these risks.  Risks include, but are not limited to, cough, sore throat, vomiting, nausea, somnolence, esophageal and stomach trauma or perforation, bleeding, low blood pressure, aspiration, pneumonia, infection, trauma to the teeth and death.    Time Out: Verified patient identification, verified procedure, site/side was marked, verified correct patient position, special equipment/implants available, medications/allergies/relevent history reviewed, required imaging and test results available. Performed  Procedure:  After a procedural time-out, the patient was given Propofol per anesthesia for sedation. The patient's heart rate, blood pressure, and oxygen saturation are monitored continuously during the procedure. The oropharynx was anesthetized with topical cetacaine spray.  The transesophageal probe was inserted in the esophagus and stomach without difficulty and multiple views were obtained. Agitated microbubble saline contrast was administered.  Complications:    Complications: None Patient did tolerate procedure well.  Findings:  1. LEFT VENTRICLE: The left ventricular wall thickness is mildly increased.  The left ventricular cavity is normal in size. Wall motion is normal.  LVEF is 50-55%.  2. RIGHT VENTRICLE:  The right ventricle is normal in structure and function without any thrombus or masses.    3. LEFT ATRIUM:  The left atrium is moderately dialted in size without any thrombus or masses.  There is spontaneous echo contrast ("smoke") in the left atrium consistent with a low flow state.  4. LEFT ATRIAL APPENDAGE:  The left atrial appendage is free of any thrombus or masses. The appendage has single lobes. Pulse  doppler indicates moderate flow in the appendage.  5. ATRIAL SEPTUM:  The atrial septum appears intact and is free of thrombus and/or masses.  There is no evidence for interatrial shunting by color doppler and saline microbubble.  6. RIGHT ATRIUM:  The right atrium is normal in size and function without any thrombus or masses.  7. MITRAL VALVE:  The mitral valve is normal in structure and function with Moderate regurgitation.  There were no vegetations or stenosis.  8. AORTIC VALVE:  The aortic valve is trileaflet and mildly sclerotic with trivial regurgitation.  There were no vegetations or stenosis  9. TRICUSPID VALVE:  The tricuspid valve is normal in structure and function with mild regurgitation.  There were no vegetations or stenosis  10.  PULMONIC VALVE:  The pulmonic valve is normal in structure and function with no regurgitation.  There were no vegetations or stenosis.   11. AORTIC ARCH, ASCENDING AND DESCENDING AORTA:  There was grade 2 Ron Parker et. Al, 1992) atherosclerosis of the ascending aorta, aortic arch, or proximal descending aorta.  12. PULMONARY VEINS: Anomalous pulmonary venous return was not noted.  13. PERICARDIUM: The pericardium appeared normal and non-thickened.  There is no pericardial effusion.  CARDIOVERSION:     Second Time Out: Verified patient identification, verified procedure, site/side was marked, verified correct patient position, special equipment/implants available, medications/allergies/relevent history reviewed, required imaging and test results available.  Performed  Procedure:  1. Patient placed on cardiac monitor, pulse oximetry, supplemental oxygen as necessary.  2. Sedation administered per anesthesia 3. Pacer pads placed anterior and posterior chest. 4. Cardioverted 1 time(s).  5. Cardioverted at 150J biphasic.  Complications:  Complications: None Patient did tolerate procedure well. Conversion to marked sinus bradycardia with competing  junctional rhythm. Frequent and sometimes bigeminal PVC's were noted. The patient was  given Robinol, 1/2 amp calcium chloride and neosynephrine/phenylephrine by anesthesia for hypotension, which resolved within 5 minutes.  Impression:  1. Successful cardioversion to marked sinus bradycardia with competing junctional rhythm and frequent PVC's. Complicated by hypotension, possibly d/t IV diltiazem which was stopped and partially reversed.  Recommendations:  1. Avoid AVN blocking agents - may need to consider pacemaker at some point if she continues to have features of tachy/brady syndrome.  Time Spent Directly with the Patient:  60 minutes   Pixie Casino, MD, [place]  Attending Cardiologist  Direct Dial: [phone]  Fax: [phone]  Website:  [email or web address] Alta Goding 07/14/2017, 3:18 PM

## 2017-07-14 NOTE — Progress Notes (Signed)
CSW informed by nursing staff that patient is from facility. CSW met with patient and family at bedside. Patient's son and two daughters were present; patient slept for most of meeting. Family indicated patient from [place] and are hoping for patient to return there. Patient not yet assessed by PT. CSW will be available if disposition becomes SNF.   Estanislado Emms, [place]

## 2017-07-14 NOTE — Telephone Encounter (Signed)
[name], daughter called and stated that they would like for patient to go back on Aricept. Stated that patient is in the hospital with AFIB. She was talking with Cardiologist yesterday he made comment that he saw no issues with patient being on Aricept. Daughter is with her 24/7 and can tell a difference when taking it.  Pharmacy [employer]. Please Advise.

## 2017-07-14 NOTE — Progress Notes (Signed)
SPOke with Dr ROse re: HR and BP, was told she is ok to return to room

## 2017-07-15 ENCOUNTER — Inpatient Hospital Stay (HOSPITAL_COMMUNITY): Payer: Medicare Other

## 2017-07-15 ENCOUNTER — Encounter (HOSPITAL_COMMUNITY): Payer: Self-pay | Admitting: Internal Medicine

## 2017-07-15 MED ORDER — DONEPEZIL HCL 10 MG PO TABS
10.0000 mg | ORAL_TABLET | Freq: Every day | ORAL | Status: DC
  Administered 2017-07-15 – 2017-07-17 (×3): 10 mg via ORAL
  Filled 2017-07-15 (×3): qty 1

## 2017-07-15 NOTE — Progress Notes (Signed)
Report received via Meredith Mody RN in patient's room using SBAR format, reviewed VS, meds, labs, PMH and general condition, assumed care of patient.

## 2017-07-15 NOTE — Care Management Important Message (Signed)
Important Message  Patient Details  Name: Brittney Gutierrez MRN: [id number] Date of Birth: [birth year]   Medicare Important Message Given:  Yes    Nathen May 07/15/2017, 9:47 AM

## 2017-07-15 NOTE — Plan of Care (Signed)
Problem: Safety: Goal: Ability to remain free from injury will improve Outcome: Progressing Patient remains in a low bed with mats on both sides of bed and daughter in room but patient is picking at everything and is alert to self and daughter but thinks she's at home, bed alarm is on for her safety, will continue to monitor.

## 2017-07-15 NOTE — Progress Notes (Signed)
Progress Note  Patient Name: Brittney Gutierrez Date of Encounter: 07/15/2017  Primary Cardiologist: Wreatha Sturgeon  Subjective   Had a successful cardioversion yesterday, followed by transient hypotension. Descending normal sinus rhythm with frequent PVCs overnight, heart rate around 70. Remains rather emotional, had some confusion and agitation overnight, does not like wearing her oxygen. Right now walking with occupational therapist and doing fairly well.  Inpatient Medications    Scheduled Meds: . apixaban  2.5 mg Oral BID  . cholecalciferol  2,000 Units Oral Daily  . docusate sodium  100 mg Oral BID  . donepezil  5 mg Oral QHS  . losartan  100 mg Oral Daily  . metoprolol tartrate  75 mg Oral BID  . sodium chloride flush  3 mL Intravenous Q12H  . sodium chloride flush  3 mL Intravenous Q12H  . venlafaxine  37.5 mg Oral BID   Continuous Infusions: . sodium chloride    . sodium chloride    . calcium chloride    . diltiazem (CARDIZEM) infusion 15 mg/hr (07/14/17 1306)   PRN Meds: acetaminophen, magnesium hydroxide, nitroGLYCERIN, ondansetron (ZOFRAN) IV, sodium chloride flush, sodium chloride flush   Vital Signs    Vitals:   07/14/17 2100 07/14/17 2209 07/15/17 0412 07/15/17 0800  BP: (!) 111/59 (!) 111/59 (!) 135/50 (!) 141/64  Pulse:  (!) 59 70   Resp:   19   Temp: 98.8 F (37.1 C)  98.1 F (36.7 C) 98.2 F (36.8 C)  TempSrc: Axillary  Oral Oral  SpO2:   91% 96%  Weight:      Height:        Intake/Output Summary (Last 24 hours) at 07/15/17 0844 Last data filed at 07/14/17 2200  Gross per 24 hour  Intake              520 ml  Output                0 ml  Net              520 ml   Filed Weights   07/12/17 0404 07/13/17 0515 07/14/17 0325  Weight: 130 lb (59 kg) 143 lb (64.9 kg) 148 lb (67.1 kg)    Telemetry    Sinus rhythm with very frequent PACs and PVCs - Personally Reviewed  ECG    Sinus rhythm with a single PAC, incomplete left bundle branch  block/almost left anterior fascicular block pattern, pre-existing prominent lateral ST segment depression and T-wave inversion, QTC 421 ms - Personally Reviewed  Physical Exam  Calm at baseline but becomes easily flustered and distressed GEN: No acute distress.   Neck: No JVD Cardiac: RRR with frequent ectopy, no murmurs, rubs, or gallops.  Respiratory: Clear to auscultation bilaterally. GI: Soft, nontender, non-distended  MS: No edema; No deformity. Neuro:  Nonfocal  Psych: Normal affect   Labs    Chemistry Recent Labs Lab 07/12/17 0403 07/13/17 0640 07/14/17 0633  NA 130* 126* 130*  K 4.6 4.6 4.3  CL 102 98* 102  CO2 20* 19* 20*  GLUCOSE 129* 134* 116*  BUN 28* 32* 31*  CREATININE 1.46* 1.76* 1.47*  CALCIUM 8.8* 8.9 8.9  GFRNONAA 31* 25* 31*  GFRAA 36* 29* 36*  ANIONGAP 8 9 8      Hematology Recent Labs Lab 07/09/17 1321  WBC 9.8  RBC 3.98  HGB 11.4*  13.9  HCT 34.2*  41.0  MCV 85.9  MCH 28.6  MCHC 33.3  RDW 13.8  PLT  242    Cardiac EnzymesNo results for input(s): TROPONINI in the last 168 hours.  Recent Labs Lab 07/09/17 1319  TROPIPOC 0.02     BNPNo results for input(s): BNP, PROBNP in the last 168 hours.   DDimer  Recent Labs Lab 07/11/17 1611  DDIMER 0.75*     Radiology    No results found.  Cardiac Studies   07/11/2017 - Left ventricle: The cavity size was normal. Wall thickness was   increased in a pattern of moderate LVH. Systolic function was   normal. The estimated ejection fraction was in the range of 50%   to 55%. Wall motion was normal; there were no regional wall   motion abnormalities. The study is not technically sufficient to   allow evaluation of LV diastolic function. - Aortic valve: There was mild regurgitation. - Mitral valve: Calcified annulus. There was mild regurgitation. - Left atrium: The atrium was moderately dilated. - Pulmonary arteries: Systolic pressure was mildly to moderately   increased. PA peak  pressure: 48 mm Hg (S).  Impressions:  - Normal LV systolic function; moderate LVH; sclerotic aortic valve   with mild AI; mild MR; moderate LAE; mild TR with mild to   moderate elevation in pulmonary pressure.  Patient Profile     83 y.o. female with tachycardia-bradycardia syndrome, symptomatic persistent atrial fibrillation with difficult ventricular rate control, complicated by congestive heart failure and acute renal insufficiency, status post cardioversion 07/14/2017, on a background of progressive cognitive decline due to dementia  Assessment & Plan    1. AFib: Substantial improvement after return to sinus rhythm, both from a hemodynamic and overall clinical sampling.. She did not tolerate amiodarone in the past due to severe pruritus. Only other option might the dofetilide. QT Is actually fully normal limits sinus rhythm, 420 ms.  CHA2DS2Vasc score of at least 4(age 83, gender, hypertension ; if + TIA would be 6).On anticoagulant. 2. History of bradycardia:Trying to put pacemaker implantation per patient and family wishes. Seems to be tolerating the Motrin higher dose of metoprolol so far. Restarted at lower dose Aricept last night. 3. OEU:MPNTIR LVEF by echo on this admission, exacerbation related to rapid ventricular rate. 4. HTN: Well controlled 5. Dementia:  increase Aricept to 10 mg daily night if heart rate allows 6. Acute on chronic renal insufficiency:No labs checked today. Recheck tomorrow. Baseline GFR usually around 50. Hyponatremia has improved since yesterday. Diuretics are on hold. Oral intake is poor. 7. Dementia: According to family, she has deteriorated substantially following the recent change in her psychotropic medications. Restart Aricept on this admission. Monitor for bradycardia for another 24 hours. Will talk to family today about different CODE STATUS. Aggressive resuscitation is probably not appropriate for this elderly patient.  Signed, Sanda Klein,  MD  07/15/2017, 8:44 AM

## 2017-07-15 NOTE — Progress Notes (Signed)
RN into complete assessment.  RN notes shortness of breath while patient resting in bed, moderate accessory muscle use. Oxygen saturation low 90's on 2L nasal cannula.  Bilateral upper lobes clear to ausculation.  Right lower lobe with fine crackles, left lower lobe diminished. Respirations 22.  Cardiology paged with this information via Amion.

## 2017-07-15 NOTE — Progress Notes (Signed)
RN spoke with [name] on call with Cardiology about information in RN's previous note.  Order received for STAT chest X-ray.

## 2017-07-15 NOTE — Plan of Care (Signed)
Problem: Pain Managment: Goal: General experience of comfort will improve Outcome: Completed/Met Date Met: 07/15/17 Patient denies pain at this time and seems comfortable with no s/s of distress noted, VSS.

## 2017-07-15 NOTE — Anesthesia Postprocedure Evaluation (Signed)
Anesthesia Post Note  Patient: Brittney Gutierrez  Procedure(s) Performed: Procedure(s) (LRB): TRANSESOPHAGEAL ECHOCARDIOGRAM (TEE) (N/A) CARDIOVERSION (N/A)     Patient location during evaluation: Endoscopy Anesthesia Type: General Level of consciousness: awake and alert Pain management: pain level controlled Vital Signs Assessment: post-procedure vital signs reviewed and stable Respiratory status: spontaneous breathing, nonlabored ventilation, respiratory function stable and patient connected to nasal cannula oxygen Cardiovascular status: stable Postop Assessment: no signs of nausea or vomiting Anesthetic complications: no    Last Vitals:  Vitals:   07/15/17 1135 07/15/17 1253  BP: (!) 143/62   Pulse:    Resp:    Temp:  37.1 C    Last Pain:  Vitals:   07/15/17 1253  TempSrc: Oral  PainSc:                  Jomaira Darr

## 2017-07-15 NOTE — Evaluation (Signed)
Physical Therapy Evaluation Patient Details Name: Brittney Gutierrez MRN: [id number] DOB: 1927-07-15 Today's Date: 07/15/2017   History of Present Illness  83 y/o female, resident of [facility] with a history of PAF and SSS was at PCP office and had near syncopal episode, brought to ED and found to have A-fib with RVR; evaled for CVA and MRI negative. PHMx: dementia, anxiety, paranoia, depression, brain sx for parasite  Clinical Impression  Patient presents with generalized weakness, deconditioning, dyspnea on exertion, impaired balance and impaired mobility s/p above. Pt with hx of dementia and has impaired safety awareness. From [facility] and family will be providing 24/7 supervision so pt can return there. Sp02 stable on RA in 90s, drops to low 80s with activity requiring 2L/min 02 to maintain >90%. Encouraged daily walking with tech and RN. Will follow acutely to maximize independence and mobility prior to return home.     Follow Up Recommendations Home health PT;Supervision for mobility/OOB    Equipment Recommendations  None recommended by PT    Recommendations for Other Services       Precautions / Restrictions Precautions Precaution Comments: watch 02 Restrictions Weight Bearing Restrictions: No      Mobility  Bed Mobility Overal bed mobility: Modified Independent             General bed mobility comments: Sitting in chair upon PT arrival.  Transfers Overall transfer level: Needs assistance Equipment used: Rolling walker (2 wheeled) Transfers: Sit to/from Stand Sit to Stand: Min assist         General transfer comment: Assist to power to standing from chair x2 with cues for hand placement as pt pulling up on RW. Pt parking RW halfway back to chair and furniture walking for support.  Ambulation/Gait Ambulation/Gait assistance: Min assist Ambulation Distance (Feet): 150 Feet Assistive device: Rolling walker (2  wheeled) Gait Pattern/deviations: Step-through pattern;Decreased stride length;Trunk flexed Gait velocity: unsafe speed towards end of gait.   General Gait Details: Slow, unsteady gait with Min A for balance/safety. Difficulty with turns. 2/4 DOE. Sp02 dropped to low 80s on RA, donned 2L and Sp02 remained >92%. HR stable.  Stairs            Wheelchair Mobility    Modified Rankin (Stroke Patients Only)       Balance Overall balance assessment: Needs assistance Sitting-balance support: Feet supported;No upper extremity supported Sitting balance-Leahy Scale: Good     Standing balance support: During functional activity;Bilateral upper extremity supported Standing balance-Leahy Scale: Poor Standing balance comment: Reliant on UEs for support. Parked RW in room and walked to chair with Min A. poor safety.                             Pertinent Vitals/Pain Pain Assessment: Faces Faces Pain Scale: Hurts a little bit Pain Location: neck Pain Descriptors / Indicators: Sore Pain Intervention(s): Monitored during session;Repositioned;Heat applied    Home Living Family/patient expects to be discharged to::  ([facility]dependent [facility]) Living Arrangements: Alone Available Help at Discharge: Family;Available 24 hours/day Type of Home: Independent living facility Home Access: Level entry     Home Layout: One level Home Equipment: Cane - single point;Shower seat - built in;Grab bars - toilet;Grab bars - tub/shower;Hand held shower head;Walker - 4 wheels Additional Comments: dtr in room reports someone will be with her 24/7 until they feel she can be left alone    Prior Function Level  of Independence: Needs assistance   Gait / Transfers Assistance Needed: Does not use AD in apartment, but uses 4RW when going out  ADL's / Homemaking Assistance Needed: was only needing A with meds pta        Hand Dominance   Dominant Hand: Right    Extremity/Trunk  Assessment   Upper Extremity Assessment Upper Extremity Assessment: Defer to OT evaluation    Lower Extremity Assessment Lower Extremity Assessment: Generalized weakness    Cervical / Trunk Assessment Cervical / Trunk Assessment: Kyphotic  Communication   Communication: Expressive difficulties  Cognition Arousal/Alertness: Awake/alert Behavior During Therapy: WFL for tasks assessed/performed Overall Cognitive Status: History of cognitive impairments - at baseline                                        General Comments General comments (skin integrity, edema, etc.): Daughter present during session.    Exercises     Assessment/Plan    PT Assessment Patient needs continued PT services  PT Problem List Decreased strength;Decreased mobility;Decreased safety awareness;Pain;Decreased balance;Decreased cognition;Cardiopulmonary status limiting activity;Decreased activity tolerance       PT Treatment Interventions Therapeutic activities;Gait training;Therapeutic exercise;Patient/family education;Balance training;Functional mobility training    PT Goals (Current goals can be found in the Care Plan section)  Acute Rehab PT Goals Patient Stated Goal: to go home PT Goal Formulation: With patient Time For Goal Achievement: 07/29/17 Potential to Achieve Goals: Good    Frequency Min 3X/week   Barriers to discharge        Co-evaluation               AM-PAC PT "6 Clicks" Daily Activity  Outcome Measure Difficulty turning over in bed (including adjusting bedclothes, sheets and blankets)?: None Difficulty moving from lying on back to sitting on the side of the bed? : None Difficulty sitting down on and standing up from a chair with arms (e.g., wheelchair, bedside commode, etc,.)?: Total Help needed moving to and from a bed to chair (including a wheelchair)?: A Little Help needed walking in hospital room?: A Little Help needed climbing 3-5 steps with a  railing? : A Lot 6 Click Score: 17    End of Session Equipment Utilized During Treatment: Gait belt;Oxygen Activity Tolerance: Patient tolerated treatment well Patient left: in chair;with call bell/phone within reach;with family/visitor present Nurse Communication: Mobility status PT Visit Diagnosis: Unsteadiness on feet (R26.81);Muscle weakness (generalized) (M62.81);Repeated falls (R29.6)    Time: 1045-1110 PT Time Calculation (min) (ACUTE ONLY): 25 min   Charges:   PT Evaluation $PT Eval Moderate Complexity: 1 Mod PT Treatments $Gait Training: 8-22 mins   PT G Codes:        Wray Kearns, PT, DPT [phone]    [place] 07/15/2017, 11:23 AM

## 2017-07-15 NOTE — Evaluation (Signed)
Occupational Therapy Evaluation Patient Details Name: Brittney Gutierrez MRN: [id number] DOB: 10-Feb-1927 Today's Date: 07/15/2017    History of Present Illness 83 y/o female, resident of [facility] independent living with a history of PAF and SSS was at PCP office and had near syncopal episode, brought to ED and found to have A-fib with RVR; evaled for CVA and MRI negative. PHMx: dementia, anxiety, paranoia, depression, brain sx for parasite   Clinical Impression   This 83 yo female admitted with above presents to acute OT with deficits below (see OT problem list) thus affecting her PLOF of only needing A for meds and meals. She will benefit from acute OT with follow up Jette.     Follow Up Recommendations  Home health OT;Supervision/Assistance - 24 hour    Equipment Recommendations  None recommended by OT       Precautions / Restrictions Precautions Precaution Comments: Pt was 78% on RA when I entered (dtr reports pt would not keep O2 on overnight, kept pulling if off and also pulled of O2 probe); O2 reapplied at 2.5 liters (what it was set on when I entered) with sats up to 92%  at end of session 95% on 2 liters)      Mobility Bed Mobility Overal bed mobility: Modified Independent             General bed mobility comments: increased time  Transfers Overall transfer level: Needs assistance Equipment used: Rolling walker (2 wheeled) Transfers: Sit to/from Stand Sit to Stand: Min assist              Balance Overall balance assessment: Needs assistance Sitting-balance support: Feet supported;No upper extremity supported Sitting balance-Leahy Scale: Good     Standing balance support: No upper extremity supported Standing balance-Leahy Scale: Fair Standing balance comment: pt did have one loss of balance as she turned from sink and did not use walker                           ADL either performed or assessed with clinical judgement   ADL Overall ADL's  : Needs assistance/impaired Eating/Feeding: Sitting;Supervision/ safety;Set up   Grooming: Min guard;Standing;Wash/dry hands;Wash/dry face;Oral care   Upper Body Bathing: Supervision/ safety;Set up;Sitting   Lower Body Bathing: Min guard;Sit to/from stand   Upper Body Dressing : Set up;Supervision/safety;Sitting   Lower Body Dressing: Min guard;Sit to/from stand   Toilet Transfer: Minimal assistance;Ambulation;RW   Toileting- Water quality scientist and Hygiene: Min guard;Sit to/from stand               Vision Baseline Vision/History: Wears glasses Wears Glasses: At all times Patient Visual Report: No change from baseline              Pertinent Vitals/Pain Pain Assessment: No/denies pain     Hand Dominance Right   Extremity/Trunk Assessment Upper Extremity Assessment Upper Extremity Assessment: Overall WFL for tasks assessed           Communication Communication Communication: Expressive difficulties   Cognition Arousal/Alertness: Awake/alert Behavior During Therapy:  (labile at times) Overall Cognitive Status: History of cognitive impairments - at baseline                                                Home Living Family/patient expects to be discharged to::  (Independent living at  [facility]) Living Arrangements: Alone Available Help at Discharge: Family;Available 24 hours/day Type of Home: Independent living facility Home Access: Level entry     Home Layout: One level     Bathroom Shower/Tub: Occupational psychologist: Standard     Home Equipment: Cane - single point;Shower seat - built in;Grab bars - toilet;Grab bars - tub/shower;Hand held Tourist information centre manager - 4 wheels   Additional Comments: dtr in room reports someone will be with her 24/7 until they feel she can be left alone      Prior Functioning/Environment Level of Independence: Needs assistance  Gait / Transfers Assistance Needed: Does not use AD in  apartment, but uses 4RW when going out ADL's / Homemaking Assistance Needed: was only needing A with meds pta            OT Problem List: Decreased strength;Decreased cognition;Decreased safety awareness      OT Treatment/Interventions: Self-care/ADL training;Patient/family education;DME and/or AE instruction;Balance training    OT Goals(Current goals can be found in the care plan section) Acute Rehab OT Goals Patient Stated Goal: to go home OT Goal Formulation: With patient/family Time For Goal Achievement: 07/22/17 Potential to Achieve Goals: Good  OT Frequency: Min 2X/week              AM-PAC PT "6 Clicks" Daily Activity     Outcome Measure Help from another person eating meals?: A Little Help from another person taking care of personal grooming?: A Little Help from another person toileting, which includes using toliet, bedpan, or urinal?: A Little Help from another person bathing (including washing, rinsing, drying)?: A Little Help from another person to put on and taking off regular upper body clothing?: A Little Help from another person to put on and taking off regular lower body clothing?: A Little 6 Click Score: 18   End of Session Equipment Utilized During Treatment: Gait belt;Rolling walker Nurse Communication:  (O2 sats at beginning of session)  Activity Tolerance: Patient tolerated treatment well Patient left: in chair;with call bell/phone within reach;with family/visitor present (no alarm set due to dtr said she would not leave her alone)  OT Visit Diagnosis: Unsteadiness on feet (R26.81);Cognitive communication deficit (R41.841);Muscle weakness (generalized) (M62.81)                Time: 0100-7121 OT Time Calculation (min): 46 min Charges:  OT General Charges $OT Visit: 1 Procedure OT Evaluation $OT Eval Moderate Complexity: 1 Procedure OT Treatments $Self Care/Home Management : 23-37 mins Golden Circle, OTR/L [phone] 07/15/2017

## 2017-07-16 ENCOUNTER — Encounter (HOSPITAL_COMMUNITY): Payer: Self-pay

## 2017-07-16 LAB — BLOOD GAS, ARTERIAL
ACID-BASE DEFICIT: 4.7 mmol/L — AB (ref 0.0–2.0)
BICARBONATE: 19.4 mmol/L — AB (ref 20.0–28.0)
DRAWN BY: 317771
Delivery systems: POSITIVE
Expiratory PAP: 6
FIO2: 100
Inspiratory PAP: 12
O2 SAT: 97.5 %
PO2 ART: 110 mmHg — AB (ref 83.0–108.0)
Patient temperature: 98.6
pCO2 arterial: 33.1 mmHg (ref 32.0–48.0)
pH, Arterial: 7.386 (ref 7.350–7.450)

## 2017-07-16 LAB — BASIC METABOLIC PANEL
ANION GAP: 12 (ref 5–15)
BUN: 27 mg/dL — ABNORMAL HIGH (ref 6–20)
CHLORIDE: 95 mmol/L — AB (ref 101–111)
CO2: 21 mmol/L — AB (ref 22–32)
Calcium: 9 mg/dL (ref 8.9–10.3)
Creatinine, Ser: 1.26 mg/dL — ABNORMAL HIGH (ref 0.44–1.00)
GFR calc Af Amer: 43 mL/min — ABNORMAL LOW (ref >60)
GFR calc non Af Amer: 37 mL/min — ABNORMAL LOW (ref >60)
GLUCOSE: 168 mg/dL — AB (ref 65–99)
POTASSIUM: 4.1 mmol/L (ref 3.5–5.1)
Sodium: 128 mmol/L — ABNORMAL LOW (ref 135–145)

## 2017-07-16 MED ORDER — FUROSEMIDE 10 MG/ML IJ SOLN
40.0000 mg | Freq: Two times a day (BID) | INTRAMUSCULAR | Status: AC
Start: 2017-07-16 — End: 2017-07-16
  Administered 2017-07-16 (×2): 40 mg via INTRAVENOUS
  Filled 2017-07-16 (×2): qty 4

## 2017-07-16 MED ORDER — ALBUTEROL SULFATE (2.5 MG/3ML) 0.083% IN NEBU
2.5000 mg | INHALATION_SOLUTION | Freq: Once | RESPIRATORY_TRACT | Status: AC | PRN
  Administered 2017-07-16: 2.5 mg via RESPIRATORY_TRACT

## 2017-07-16 MED ORDER — AMLODIPINE BESYLATE 2.5 MG PO TABS
2.5000 mg | ORAL_TABLET | Freq: Every day | ORAL | Status: DC
  Administered 2017-07-16 – 2017-07-18 (×3): 2.5 mg via ORAL
  Filled 2017-07-16 (×3): qty 1

## 2017-07-16 MED ORDER — POTASSIUM CHLORIDE CRYS ER 10 MEQ PO TBCR
10.0000 meq | EXTENDED_RELEASE_TABLET | Freq: Two times a day (BID) | ORAL | Status: DC
  Administered 2017-07-16 – 2017-07-18 (×5): 10 meq via ORAL
  Filled 2017-07-16 (×5): qty 1

## 2017-07-16 MED ORDER — ALBUTEROL SULFATE (2.5 MG/3ML) 0.083% IN NEBU
INHALATION_SOLUTION | RESPIRATORY_TRACT | Status: AC
  Filled 2017-07-16: qty 3

## 2017-07-16 MED ORDER — FUROSEMIDE 10 MG/ML IJ SOLN
INTRAMUSCULAR | Status: AC
  Administered 2017-07-16: 40 mg
  Filled 2017-07-16: qty 4

## 2017-07-16 NOTE — Significant Event (Addendum)
Rapid Response Event Note RN called for sats mid 80's, crackles in base of lungs Overview: Time Called: 0026 Arrival Time: 0030 Event Type: Respiratory  Initial Focused Assessment: Pt sitting upright in bed, NRB in place, pt unable to tolerate NRB, obvious distress from pt, kept repeating "help me with some air." CXR ordered by Dr. Radford Pax PTA. Sats 84% with NRB. Pt diaphoretic, skin cool and clammy, confused, continued to pull NRB from face, crackles noted through out, use of accessory muscles.  Interventions: Gave albuterol 2.5 mg CXR (PTA) result in progression of CHF and layering effusions.  Placed on Bipap stas sats 100% ABG 7.3/33.1/110/19.4 Lasix 40 mg IVP Plan of Care (if not transferred): Continue to monitor pt, call RRT as needed.  Event Summary: Name of Physician Notified: Dr. Radford Pax  at  (PTA RRT )    at    Outcome: Stayed in room and stabalized  Event End Time: 0130  Armen Pickup

## 2017-07-16 NOTE — Progress Notes (Signed)
Patient appears to be resting comfortably.  External female catheter in place, urine noted in cannister.  Screen on low bed control for weight is not reading.  Due to patient continuing on BIPAP unable to obtain standing weight at this time.

## 2017-07-16 NOTE — Accreditation Note (Signed)
Pt and family had indicated she has not had a BM since Friday. Requesting for a laxative if applicable. Apparently had Milk of magnesia but was not successful.

## 2017-07-16 NOTE — Progress Notes (Signed)
Progress Note  Patient Name: Brittney Gutierrez Date of Encounter: 07/16/2017  Primary Cardiologist: Brityn Mastrogiovanni  Subjective   Developed respiratory distress due to pulmonary edema yesterday evening. Now on BiPAP, comfortable, lying almost flat in bed. I thought yesterday weight of 143 lb was in error (13 lb gain from day before), but today weight is even higher at 148lb.  BP mildly elevated, consistently.  Inpatient Medications    Scheduled Meds: . apixaban  2.5 mg Oral BID  . cholecalciferol  2,000 Units Oral Daily  . docusate sodium  100 mg Oral BID  . donepezil  10 mg Oral QHS  . losartan  100 mg Oral Daily  . metoprolol tartrate  75 mg Oral BID  . sodium chloride flush  3 mL Intravenous Q12H  . sodium chloride flush  3 mL Intravenous Q12H  . venlafaxine  37.5 mg Oral BID   Continuous Infusions: . sodium chloride    . sodium chloride    . calcium chloride     PRN Meds: acetaminophen, magnesium hydroxide, nitroGLYCERIN, ondansetron (ZOFRAN) IV, sodium chloride flush, sodium chloride flush   Vital Signs    Vitals:   07/16/17 0115 07/16/17 0529 07/16/17 0804 07/16/17 0810  BP:  (!) 143/53 (!) 150/63   Pulse: 78 66 67 68  Resp: (!) 29 (!) 26 (!) 25 (!) 24  Temp:  98.7 F (37.1 C)  (!) 97.5 F (36.4 C)  TempSrc:  Axillary Axillary Axillary  SpO2: 100% 100% 100% 100%  Weight:      Height:        Intake/Output Summary (Last 24 hours) at 07/16/17 0853 Last data filed at 07/16/17 0600  Gross per 24 hour  Intake                0 ml  Output              525 ml  Net             -525 ml   Filed Weights   07/12/17 0404 07/13/17 0515 07/14/17 0325  Weight: 130 lb (59 kg) 143 lb (64.9 kg) 148 lb (67.1 kg)    Telemetry    NSR, frequent PVCs - Personally Reviewed  ECG    pending - Personally Reviewed  Physical Exam  On Bipap, calm GEN: No acute distress.   Neck: 7-8 cm JVD Cardiac: RRR, no murmurs, rubs, or gallops.  Respiratory: still some crackle  bilaterally. GI: Soft, nontender, non-distended  MS: No edema; No deformity. Neuro:  Nonfocal  Psych: Normal affect   Labs    Chemistry Recent Labs Lab 07/13/17 0640 07/14/17 0633 07/16/17 0206  NA 126* 130* 128*  K 4.6 4.3 4.1  CL 98* 102 95*  CO2 19* 20* 21*  GLUCOSE 134* 116* 168*  BUN 32* 31* 27*  CREATININE 1.76* 1.47* 1.26*  CALCIUM 8.9 8.9 9.0  GFRNONAA 25* 31* 37*  GFRAA 29* 36* 43*  ANIONGAP 9 8 12      Hematology Recent Labs Lab 07/09/17 1321  WBC 9.8  RBC 3.98  HGB 11.4*  13.9  HCT 34.2*  41.0  MCV 85.9  MCH 28.6  MCHC 33.3  RDW 13.8  PLT 242    Cardiac EnzymesNo results for input(s): TROPONINI in the last 168 hours.  Recent Labs Lab 07/09/17 1319  TROPIPOC 0.02     BNPNo results for input(s): BNP, PROBNP in the last 168 hours.   DDimer  Recent Labs Lab 07/11/17 1611  DDIMER  0.75*     Radiology    Dg Chest Port 1 View  Result Date: 07/16/2017 CLINICAL DATA:  Progressive dyspnea EXAM: PORTABLE CHEST 1 VIEW COMPARISON:  07/12/2017 FINDINGS: Obscuration of the cardiac silhouette secondary to layering bilateral effusions left greater than right. Mild interstitial edema. Chronic interstitial prominence of the lungs. Superimposed pneumonia would be difficult to entirely exclude especially on the left due to superimposed left effusion. The aorta is obscured by patient rotation and overlap with the dorsal spine. There appears be aortic atherosclerosis however. There is osteoarthritic joint space narrowing and sclerosis of the AC and glenohumeral joints with high-riding humeral head narrowing the impingement space for the rotator cuff. This is chronic. Thoracic spondylosis is noted. IMPRESSION: Progression of CHF and layering effusions since prior exam. Superimposed pneumonia would be difficult to entirely exclude. Electronically Signed   By: Ashley Royalty M.D.   On: 07/16/2017 00:49    Cardiac Studies   07/11/2017 - Left ventricle: The cavity size  was normal. Wall thickness was increased in a pattern of moderate LVH. Systolic function was normal. The estimated ejection fraction was in the range of 50% to 55%. Wall motion was normal; there were no regional wall motion abnormalities. The study is not technically sufficient to allow evaluation of LV diastolic function. - Aortic valve: There was mild regurgitation. - Mitral valve: Calcified annulus. There was mild regurgitation. - Left atrium: The atrium was moderately dilated. - Pulmonary arteries: Systolic pressure was mildly to moderately increased. PA peak pressure: 48 mm Hg (S).  Impressions:  - Normal LV systolic function; moderate LVH; sclerotic aortic valve with mild AI; mild MR; moderate LAE; mild TR with mild to moderate elevation in pulmonary pressure.  07/14/2017 Procedure:  1. Patient placed on cardiac monitor, pulse oximetry, supplemental oxygen as necessary.  2. Sedation administered per anesthesia 3. Pacer pads placed anterior and posterior chest. 4. Cardioverted 1 time(s).  5. Cardioverted at 150J biphasic.  Complications:  Complications: None Patient did tolerate procedure well. Conversion to marked sinus bradycardia with competing junctional rhythm. Frequent and sometimes bigeminal PVC's were noted. The patient was given Robinol, 1/2 amp calcium chloride and neosynephrine/phenylephrine by anesthesia for hypotension, which resolved within 5 minutes.  Impression:  1. Successful cardioversion to marked sinus bradycardia with competing junctional rhythm and frequent PVC's. Complicated by hypotension, possibly d/t IV diltiazem which was stopped and partially reversed.  Recommendations:  1. Avoid AVN blocking agents - may need to consider pacemaker at some point if she continues to have features of tachy/brady syndrome.   Patient Profile     83 y.o. female with tachycardia-bradycardia syndrome, symptomatic persistent atrial  fibrillation with difficult ventricular rate control, complicated by congestive heart failure (preserved EF) and acute renal insufficiency, status post cardioversion 07/14/2017, on a background of progressive cognitive decline due to dementia. Developed pulmonary edema 24 h after cardioversion (likely due to IVF administered for hypotension after the procedure).   Assessment & Plan    1. Acute on chronic diastolic HF: suspect due to IVF for post CV hypotension. If weight is accurate, need to diurese 18 lb. Hyponatremia will probably improve with diuresis. 2. AFib:  Maintaining NSR after DCCV. On anticoagulation. CHA2DS2Vasc score of at least 4(age 85, gender, hypertension ; if + TIA would be 6).On anticoagulant. 3. CKD: renal function back to baseline (fluid overload). 4. HTN: at least in part due to volume overload. Restart home dose of amlodipine, continue losartan (now also on higher dose beta blocker). 5. Dementia:  back on old dose of Aricept, without bradycardia.  Signed, Sanda Klein, MD  07/16/2017, 8:53 AM

## 2017-07-16 NOTE — Progress Notes (Signed)
RN into check on patient.  Per patient's daughter patient had just fallen asleep.  Will continue to monitor.

## 2017-07-16 NOTE — Progress Notes (Signed)
Chest X-ray completed, awaiting results.  Patient's fingers, toes, and earlobes are cool to touch, difficult to get good waveform on oxygen saturation probe.  Per monitor patient sating upper 70's-80's on nasal cannula.  Patient is breathing through mouth.  RN switched patient to non re-breather and patient saturation 90-92%.  Cardiology paged.

## 2017-07-16 NOTE — Progress Notes (Signed)
RN paged rapid response RN to assess patient.  Rapid response RN came to bedside.  Dr. [name] (on call for cardiology) on unit.  RN received verbal order from Dr. Radford Pax to give patient 40mg  of IV push Lasix.  Breathing treatment administered per rapid response RN.  Patient started on BIPAP, oxygen saturations upper 90's to 100%.  Lasix administered.  Will continue to monitor.

## 2017-07-17 LAB — BASIC METABOLIC PANEL
Anion gap: 13 (ref 5–15)
BUN: 25 mg/dL — ABNORMAL HIGH (ref 6–20)
CALCIUM: 8.7 mg/dL — AB (ref 8.9–10.3)
CHLORIDE: 96 mmol/L — AB (ref 101–111)
CO2: 24 mmol/L (ref 22–32)
CREATININE: 1.33 mg/dL — AB (ref 0.44–1.00)
GFR, EST AFRICAN AMERICAN: 40 mL/min — AB (ref >60)
GFR, EST NON AFRICAN AMERICAN: 35 mL/min — AB (ref >60)
Glucose, Bld: 94 mg/dL (ref 65–99)
Potassium: 4.4 mmol/L (ref 3.5–5.1)
Sodium: 133 mmol/L — ABNORMAL LOW (ref 135–145)

## 2017-07-17 LAB — MAGNESIUM: Magnesium: 1.7 mg/dL (ref 1.7–2.4)

## 2017-07-17 NOTE — Progress Notes (Signed)
Patient on room air with Sp02=99%. Patient appears and states to be in no respiratory distress at this time. Bipap order changed to prn.

## 2017-07-17 NOTE — Plan of Care (Signed)
Problem: Activity: Goal: Risk for activity intolerance will decrease Outcome: Progressing Patient to and from bedside commode with RN assistance.  No shortness of breath noted with that activity.  No shortness of breath noted per RN while patient has been resting in bed.

## 2017-07-17 NOTE — Plan of Care (Signed)
Problem: Bowel/Gastric: Goal: Will not experience complications related to bowel motility Outcome: Progressing Per patient's daughter patient had large bowel movement earlier today.

## 2017-07-17 NOTE — Progress Notes (Signed)
Progress Note  Patient Name: Brittney Gutierrez Date of Encounter: 07/17/2017  Primary Cardiologist: Laval Cafaro  Subjective   Substantial improvement from all points of view. Much more alert and coherent, good appetite today. Remains in sinus rhythm with frequent PACs and PVCs. No significant bradycardia on increased dose of beta blocker Excellent oxygenation, weaning off O2. Good diuresis overnight, virtually erasing the fluid gain following her cardioversion. The broad swings in her recorded weight remained to exaggerated to be believable. For what it's worth, today's weight is identical to admission weight. Fair blood pressure control   Inpatient Medications    Scheduled Meds: . amLODipine  2.5 mg Oral Daily  . apixaban  2.5 mg Oral BID  . cholecalciferol  2,000 Units Oral Daily  . docusate sodium  100 mg Oral BID  . donepezil  10 mg Oral QHS  . losartan  100 mg Oral Daily  . metoprolol tartrate  75 mg Oral BID  . potassium chloride  10 mEq Oral BID  . sodium chloride flush  3 mL Intravenous Q12H  . sodium chloride flush  3 mL Intravenous Q12H  . venlafaxine  37.5 mg Oral BID   Continuous Infusions: . sodium chloride    . sodium chloride    . calcium chloride     PRN Meds: acetaminophen, magnesium hydroxide, nitroGLYCERIN, ondansetron (ZOFRAN) IV, sodium chloride flush, sodium chloride flush   Vital Signs    Vitals:   07/17/17 0009 07/17/17 0330 07/17/17 0356 07/17/17 0854  BP: (!) 138/59 (!) 145/64  (!) 145/93  Pulse: (!) 58 66    Resp: 18 16    Temp: 97.6 F (36.4 C) 97.7 F (36.5 C)  97.7 F (36.5 C)  TempSrc: Oral Oral  Oral  SpO2: 100% 100%  98%  Weight:   128 lb 8 oz (58.3 kg)   Height:        Intake/Output Summary (Last 24 hours) at 07/17/17 1010 Last data filed at 07/17/17 0828  Gross per 24 hour  Intake              206 ml  Output             2205 ml  Net            -1999 ml   Filed Weights   07/13/17 0515 07/14/17 0325 07/17/17 0356  Weight:  143 lb (64.9 kg) 148 lb (67.1 kg) 128 lb 8 oz (58.3 kg)    Telemetry    Sinus rhythm with frequent PACs and PVCs - Personally Reviewed  ECG    Sinus rhythm with frequent PVCs, early RS transition in lead V2, LVH with repolarization abnormalities/incomplete left bundle branch block, QTC 479 ms - Personally Reviewed  Physical Exam  Calm, relaxed, smiling for the first time during this admission  GEN: No acute distress.   Neck: No JVD Cardiac: RRR baseline with frequent ectopy, no murmurs, rubs, or gallops.  Respiratory: Clear to auscultation bilaterally. GI: Soft, nontender, non-distended  MS: No edema; No deformity. Neuro:  Nonfocal  Psych: Normal affect   Labs    Chemistry Recent Labs Lab 07/14/17 0633 07/16/17 0206 07/17/17 0244  NA 130* 128* 133*  K 4.3 4.1 4.4  CL 102 95* 96*  CO2 20* 21* 24  GLUCOSE 116* 168* 94  BUN 31* 27* 25*  CREATININE 1.47* 1.26* 1.33*  CALCIUM 8.9 9.0 8.7*  GFRNONAA 31* 37* 35*  GFRAA 36* 43* 40*  ANIONGAP 8 12 13  HematologyNo results for input(s): WBC, RBC, HGB, HCT, MCV, MCH, MCHC, RDW, PLT in the last 168 hours.  Cardiac EnzymesNo results for input(s): TROPONINI in the last 168 hours. No results for input(s): TROPIPOC in the last 168 hours.   BNPNo results for input(s): BNP, PROBNP in the last 168 hours.   DDimer  Recent Labs Lab 07/11/17 1611  DDIMER 0.75*     Radiology    Dg Chest Port 1 View  Result Date: 07/16/2017 CLINICAL DATA:  Progressive dyspnea EXAM: PORTABLE CHEST 1 VIEW COMPARISON:  07/12/2017 FINDINGS: Obscuration of the cardiac silhouette secondary to layering bilateral effusions left greater than right. Mild interstitial edema. Chronic interstitial prominence of the lungs. Superimposed pneumonia would be difficult to entirely exclude especially on the left due to superimposed left effusion. The aorta is obscured by patient rotation and overlap with the dorsal spine. There appears be aortic atherosclerosis  however. There is osteoarthritic joint space narrowing and sclerosis of the AC and glenohumeral joints with high-riding humeral head narrowing the impingement space for the rotator cuff. This is chronic. Thoracic spondylosis is noted. IMPRESSION: Progression of CHF and layering effusions since prior exam. Superimposed pneumonia would be difficult to entirely exclude. Electronically Signed   By: Ashley Royalty M.D.   On: 07/16/2017 00:49    Cardiac Studies   07/11/2017 - Left ventricle: The cavity size was normal. Wall thickness was increased in a pattern of moderate LVH. Systolic function was normal. The estimated ejection fraction was in the range of 50% to 55%. Wall motion was normal; there were no regional wall motion abnormalities. The study is not technically sufficient to allow evaluation of LV diastolic function. - Aortic valve: There was mild regurgitation. - Mitral valve: Calcified annulus. There was mild regurgitation. - Left atrium: The atrium was moderately dilated. - Pulmonary arteries: Systolic pressure was mildly to moderately increased. PA peak pressure: 48 mm Hg (S).  Impressions:  - Normal LV systolic function; moderate LVH; sclerotic aortic valve with mild AI; mild MR; moderate LAE; mild TR with mild to moderate elevation in pulmonary pressure.  Patient Profile     83 y.o. female with tachycardia-bradycardia syndrome, symptomatic persistent atrial fibrillation with difficult ventricular rate control, complicated by congestive heart failure and acute renal insufficiency, status post cardioversion 07/14/2017, on a background of progressive cognitive decline due to dementia.  Developed post cardioversion hypotension and received a lot of IV fluids, subsequently developed pulmonary edema resolved with diuretics.  Assessment & Plan    1. Acute on chronic diastolic HF: suspect due to IVF for post CV hypotension. If weight is accurate, Is back to baseline.  Hyponatremia improved after diuresis. 2. AFib:  Maintaining NSR after DCCV. Tolerating the higher dose of metoprolol without significant bradycardia. Patient and family wish to avoid pacemaker. On anticoagulation. CHA2DS2Vasc score of at least 4(age 33, gender, hypertension ; if + TIA would be 6). 3. CKD stage III: renal function back to baseline. 4. HTN: Adequate control on home dose of amlodipine and losartan, now also on higher dose beta blocker. 5. Dementia: back on old dose of Aricept, without bradycardia. This seems to be associated with substantial improvement in cognitive status.  Did reasonable well with walking yesterday. We'll try again today without oxygen. She will need home health physical therapy.   Signed, Sanda Klein, MD  07/17/2017, 10:10 AM

## 2017-07-17 NOTE — Progress Notes (Signed)
Physical Therapy Treatment Patient Details Name: Brittney Gutierrez MRN: [id number] DOB: 09-19-1927 Today's Date: 07/17/2017    History of Present Illness 83 y/o female, resident of [facility] independent [facility] with a history of PAF and SSS was at PCP office and had near syncopal episode, brought to ED and found to have A-fib with RVR; evaled for CVA and MRI negative. PHMx: dementia, anxiety, paranoia, depression, brain sx for parasite    PT Comments    Patient progressing well towards PT goals. Pt requires less assist for balance today during gait training. Sp02 maintained >90% on RA during activity and only dropped to 86% for less an a minute and then resolved with rest break. Encouraged ambulation 2 more times today. Will continue to follow.    Follow Up Recommendations  Home health PT;Supervision for mobility/OOB     Equipment Recommendations  None recommended by PT    Recommendations for Other Services       Precautions / Restrictions Precautions Precaution Comments: watch 02 Restrictions Weight Bearing Restrictions: No    Mobility  Bed Mobility               General bed mobility comments: Sitting in chair upon PT arrival.  Transfers Overall transfer level: Needs assistance Equipment used: Rolling walker (2 wheeled) Transfers: Sit to/from Stand;Stand Pivot Transfers Sit to Stand: Min guard Stand pivot transfers: Min assist       General transfer comment: Min guard for safety. Stood from chair x1, from [facility] x2. SPT chair to [facility] with Min A for balance.   Ambulation/Gait Ambulation/Gait assistance: Min guard Ambulation Distance (Feet): 130 Feet Assistive device: Rolling walker (2 wheeled) Gait Pattern/deviations: Step-through pattern;Decreased stride length;Trunk flexed Gait velocity: decreased Gait velocity interpretation: <1.8 ft/sec, indicative of risk for recurrent falls General Gait Details: Slow, unsteady gait with Min guard for safety. 2/4 DOE. Sp02  maintained >90% on RA except when pt returned to room, dropped to 86% for less than 1 min and resolved with rest.    Stairs            Wheelchair Mobility    Modified Rankin (Stroke Patients Only)       Balance Overall balance assessment: Needs assistance Sitting-balance support: No upper extremity supported Sitting balance-Leahy Scale: Good Sitting balance - Comments: Able to perform pericare without difficulty.    Standing balance support: During functional activity;Bilateral upper extremity supported Standing balance-Leahy Scale: Poor Standing balance comment: Reliant on UEs for support.                             Cognition Arousal/Alertness: Awake/alert Behavior During Therapy: WFL for tasks assessed/performed Overall Cognitive Status: History of cognitive impairments - at baseline                                        Exercises      General Comments General comments (skin integrity, edema, etc.): Daughter present during session.      Pertinent Vitals/Pain Pain Assessment: Faces Faces Pain Scale: No hurt    Home Living                      Prior Function            PT Goals (current goals can now be found in the care plan section) Progress towards PT goals:  Progressing toward goals    Frequency    Min 3X/week      PT Plan Current plan remains appropriate    Co-evaluation              AM-PAC PT "6 Clicks" Daily Activity  Outcome Measure  Difficulty turning over in bed (including adjusting bedclothes, sheets and blankets)?: None Difficulty moving from lying on back to sitting on the side of the bed? : None Difficulty sitting down on and standing up from a chair with arms (e.g., wheelchair, bedside commode, etc,.)?: Total Help needed moving to and from a bed to chair (including a wheelchair)?: A Little Help needed walking in hospital room?: A Little Help needed climbing 3-5 steps with a railing? :  A Lot 6 Click Score: 17    End of Session Equipment Utilized During Treatment: Gait belt Activity Tolerance: Patient tolerated treatment well;Treatment limited secondary to medical complications (Comment) (1 drop in Sp02 but resolved quickly) Patient left: in chair;with call bell/phone within reach;with family/visitor present Nurse Communication: Mobility status PT Visit Diagnosis: Unsteadiness on feet (R26.81);Muscle weakness (generalized) (M62.81);Repeated falls (R29.6)     Time: 8833-7445 PT Time Calculation (min) (ACUTE ONLY): 21 min  Charges:  $Therapeutic Exercise: 8-22 mins                    G Codes:       Wray Kearns, PT, DPT [phone]     Marguarite Arbour A Lashawn Bromwell 07/17/2017, 1:53 PM

## 2017-07-18 ENCOUNTER — Other Ambulatory Visit: Payer: Self-pay | Admitting: Physician Assistant

## 2017-07-18 LAB — BASIC METABOLIC PANEL
Anion gap: 8 (ref 5–15)
BUN: 22 mg/dL — AB (ref 6–20)
CHLORIDE: 97 mmol/L — AB (ref 101–111)
CO2: 26 mmol/L (ref 22–32)
CREATININE: 1.2 mg/dL — AB (ref 0.44–1.00)
Calcium: 8.7 mg/dL — ABNORMAL LOW (ref 8.9–10.3)
GFR calc Af Amer: 46 mL/min — ABNORMAL LOW (ref >60)
GFR, EST NON AFRICAN AMERICAN: 39 mL/min — AB (ref >60)
Glucose, Bld: 71 mg/dL (ref 65–99)
Potassium: 4 mmol/L (ref 3.5–5.1)
SODIUM: 131 mmol/L — AB (ref 135–145)

## 2017-07-18 MED ORDER — LOSARTAN POTASSIUM 25 MG PO TABS: 25.0000 mg | ORAL_TABLET | Freq: Every day | ORAL | 3 refills | Status: DC

## 2017-07-18 MED ORDER — DONEPEZIL HCL 10 MG PO TABS: 10.0000 mg | ORAL_TABLET | Freq: Every day | ORAL | 3 refills | Status: DC

## 2017-07-18 MED ORDER — METOPROLOL TARTRATE 75 MG PO TABS: 75.0000 mg | ORAL_TABLET | Freq: Two times a day (BID) | ORAL | 3 refills | Status: DC

## 2017-07-18 MED ORDER — LOSARTAN POTASSIUM 25 MG PO TABS: 25.0000 mg | ORAL_TABLET | Freq: Every day | ORAL | 0 refills | Status: DC

## 2017-07-18 MED ORDER — AMLODIPINE BESYLATE 5 MG PO TABS: 2.5000 mg | ORAL_TABLET | Freq: Every day | ORAL | 3 refills | Status: DC

## 2017-07-18 MED ORDER — AMLODIPINE BESYLATE 5 MG PO TABS: 5.0000 mg | ORAL_TABLET | Freq: Every day | ORAL | 3 refills | Status: DC

## 2017-07-18 NOTE — Progress Notes (Signed)
SATURATION QUALIFICATIONS: (This note is used to comply with regulatory documentation for home oxygen)  Patient Saturations on Room Air at Rest = 99  Patient Saturations on Room Air while Ambulating = 86  Patient Saturations on 2 Liters of oxygen while Ambulating = 96  Please briefly explain why patient needs home oxygen: Pt desats while walking

## 2017-07-18 NOTE — Care Management Important Message (Signed)
Important Message  Patient Details  Name: Brittney Gutierrez MRN: [id number] Date of Birth: Mar 15, 1927   Medicare Important Message Given:  Yes    Nathen May 07/18/2017, 9:32 AM

## 2017-07-18 NOTE — Care Management Note (Addendum)
Case Management Note  Patient Details  Name: Zanayah Shadowens MRN: [id number] Date of Birth: 05/24/1927  Subjective/Objective:    CHF, Afib, CKD, HTN, dementia                Action/Plan: Discharge Planning: NCM spoke to pt and Tresa Endo # [phone]. Pt lives at [facility] and has her own apt. Pt has RW. Offered choice for HH/list provided. Dtr agreeable to [facility] for [facility]. Contacted [facility] liaison with new referral. Unit RN to check walking sat prior to dc. Pt may need oxygen for home. Will need HHRN/PT orders with [facility].   PCP REED, TIFFANY L MD  Contacted [facility] for oxygen for home.   Expected Discharge Date:  07/18/2017               Expected Discharge Plan:  [place]  In-House Referral:  Clinical Social Work  Discharge planning Services  CM Consult  Post Acute Care Choice:  Home Health Choice offered to:  Adult Children  DME Arranged:  N/A DME Agency:  NA  HH Arranged:  RN, PT, Nurse's Aide [place] Agency:  [place]  Status of Service:  Completed, signed off  If discussed at [place] of Stay Meetings, dates discussed:    Additional Comments:  Erenest Rasher, RN 07/18/2017, 11:34 AM

## 2017-07-18 NOTE — Progress Notes (Signed)
Progress Note  Patient Name: Brittney Gutierrez Date of Encounter: 07/18/2017  Primary Cardiologist: Chala Gul  Subjective   Had a little bit of sundowning last evening, overall a pretty good night. Breathing well at rest and with activity. Although in/out document little mid diuresis, weight down by 2 pounds since yesterday. Now 3 pounds less than admission weight.  Maintaining normal sinus rhythm since cardioversion. Frequent PVCs persist.  Inpatient Medications    Scheduled Meds: . amLODipine  2.5 mg Oral Daily  . apixaban  2.5 mg Oral BID  . cholecalciferol  2,000 Units Oral Daily  . docusate sodium  100 mg Oral BID  . donepezil  10 mg Oral QHS  . losartan  100 mg Oral Daily  . metoprolol tartrate  75 mg Oral BID  . potassium chloride  10 mEq Oral BID  . sodium chloride flush  3 mL Intravenous Q12H  . sodium chloride flush  3 mL Intravenous Q12H  . venlafaxine  37.5 mg Oral BID   Continuous Infusions: . sodium chloride    . sodium chloride    . calcium chloride     PRN Meds: acetaminophen, magnesium hydroxide, nitroGLYCERIN, ondansetron (ZOFRAN) IV, sodium chloride flush, sodium chloride flush   Vital Signs    Vitals:   07/17/17 2113 07/18/17 0000 07/18/17 0402 07/18/17 0823  BP: (!) 138/56  (!) 175/73 (!) 163/58  Pulse: 63 62 69 60  Resp: 16 16 (!) 22   Temp: 98.1 F (36.7 C)  98 F (36.7 C) 98 F (36.7 C)  TempSrc: Oral  Oral Oral  SpO2: 99% 99% 98% 100%  Weight:   126 lb 11.2 oz (57.5 kg)   Height:        Intake/Output Summary (Last 24 hours) at 07/18/17 1137 Last data filed at 07/18/17 0825  Gross per 24 hour  Intake              240 ml  Output             1200 ml  Net             -960 ml   Filed Weights   07/14/17 0325 07/17/17 0356 07/18/17 0402  Weight: 148 lb (67.1 kg) 128 lb 8 oz (58.3 kg) 126 lb 11.2 oz (57.5 kg)    Telemetry    Normal sinus rhythm with PVCs - Personally Reviewed  ECG    No new tracing - Personally  Reviewed  Physical Exam  Relaxed, smiling, oriented to day GEN: No acute distress.   Neck: No JVD Cardiac: RRR, no murmurs, rubs, or gallops.  Respiratory: Clear to auscultation bilaterally. GI: Soft, nontender, non-distended  MS: No edema; No deformity. Neuro:  Nonfocal  Psych: Normal affect   Labs    Chemistry Recent Labs Lab 07/16/17 0206 07/17/17 0244 07/18/17 0230  NA 128* 133* 131*  K 4.1 4.4 4.0  CL 95* 96* 97*  CO2 21* 24 26  GLUCOSE 168* 94 71  BUN 27* 25* 22*  CREATININE 1.26* 1.33* 1.20*  CALCIUM 9.0 8.7* 8.7*  GFRNONAA 37* 35* 39*  GFRAA 43* 40* 46*  ANIONGAP 12 13 8      HematologyNo results for input(s): WBC, RBC, HGB, HCT, MCV, MCH, MCHC, RDW, PLT in the last 168 hours.  Cardiac EnzymesNo results for input(s): TROPONINI in the last 168 hours. No results for input(s): TROPIPOC in the last 168 hours.   BNPNo results for input(s): BNP, PROBNP in the last 168 hours.  DDimer  Recent Labs Lab 07/11/17 1611  DDIMER 0.75*     Radiology    No results found.  Cardiac Studies    07/11/2017 - Left ventricle: The cavity size was normal. Wall thickness was increased in a pattern of moderate LVH. Systolic function was normal. The estimated ejection fraction was in the range of 50% to 55%. Wall motion was normal; there were no regional wall motion abnormalities. The study is not technically sufficient to allow evaluation of LV diastolic function. - Aortic valve: There was mild regurgitation. - Mitral valve: Calcified annulus. There was mild regurgitation. - Left atrium: The atrium was moderately dilated. - Pulmonary arteries: Systolic pressure was mildly to moderately increased. PA peak pressure: 48 mm Hg (S).  Impressions:  - Normal LV systolic function; moderate LVH; sclerotic aortic valve with mild AI; mild MR; moderate LAE; mild TR with mild to moderate elevation in pulmonary pressure.   Patient Profile     83 y.o.  female with tachycardia-bradycardia syndrome, symptomatic persistent atrial fibrillation with difficult ventricular rate control, complicated by congestive heart failure and acute renal insufficiency, status post cardioversion 07/14/2017, on a background of progressive cognitive decline due to dementia.  Developed post cardioversion hypotension and received a lot of IV fluids, subsequently developed pulmonary edema resolved with diuretics.  Assessment & Plan    1. Acute on chronic diastolic HF:  due to IVF for post CV hypotension. Resolved with diuretics. Mild hyponatremia persists. Creatinine close to baseline. We'll send her home without diuretics, recommend monitoring weight daily for the next couple of weeks. 2. AFib: Maintaining NSR after DCCV. Tolerating the higher dose of metoprolol without significant bradycardia. Patient and family wish to avoid pacemaker. On anticoagulation. CHA2DS2Vasc score of at least 4(age 64, gender, hypertension ; if + TIA would be 6).Avoiding antiarrhythmic such as amiodarone due to history of bradycardia. 3. CKD stage III: renal function back to baseline. 4. HTN: Fair if not perfect control on home dose of amlodipine and  reduced dose of losartan, now also on higher dose beta blocker. If her blood pressure is still elevated at office follow-up, will increase the losartan again. 5. Dementia: back on old dose of Aricept, without bradycardia. This seems to be associated with substantial improvement in cognitive status.  She remains deconditioned and will need physical therapy as an outpatient. Discharge home today.  Signed, Sanda Klein, MD  07/18/2017, 11:37 AM

## 2017-07-18 NOTE — Progress Notes (Signed)
Physical Therapy Treatment Patient Details Name: Brittney Gutierrez MRN: [id number] DOB: 1927/11/23 Today's Date: 07/18/2017    History of Present Illness 83 y/o female, resident of [facility] independent [facility] with a history of PAF and SSS was at PCP office and had near syncopal episode, brought to ED and found to have A-fib with RVR; evaled for CVA and MRI negative. PHMx: dementia, anxiety, paranoia, depression, brain sx for parasite    PT Comments    Patient progressing well towards PT goals. Able to maintain Sp02 >93% on RA with only 1-2/4 DOE. Some mild balance deficits still present requiring close min guard- A for safety. Pt eager to return home to [facility] today. HR with some PVCs but remained in sinus rhythm. Will continue to follow.    Follow Up Recommendations  Home health PT;Supervision for mobility/OOB     Equipment Recommendations  None recommended by PT    Recommendations for Other Services       Precautions / Restrictions Precautions Precautions: Fall Precaution Comments: watch 02 Restrictions Weight Bearing Restrictions: No    Mobility  Bed Mobility Overal bed mobility: Needs Assistance;Modified Independent Bed Mobility: Supine to Sit           General bed mobility comments: No assist needed. Increased time.   Transfers Overall transfer level: Needs assistance Equipment used: Rolling walker (2 wheeled) Transfers: Sit to/from [facility] Sit to Stand: Min guard Stand pivot transfers: Min assist       General transfer comment: Min guard for safety. Stood from [employer], SPT bed to [facility] x1, Transferred to chair post ambulation.   Ambulation/Gait Ambulation/Gait assistance: Min assist Ambulation Distance (Feet): 130 Feet Assistive device: Rolling walker (2 wheeled) Gait Pattern/deviations: Step-through pattern;Decreased stride length;Trunk flexed Gait velocity: decreased Gait velocity interpretation: <1.8 ft/sec, indicative of risk for  recurrent falls General Gait Details: Slow, unsteady gait with Min guard for safety. 2/4 DOE. Sp02 maintained >93% on RA  HR in sinus with some PVCs.   Stairs            Wheelchair Mobility    Modified Rankin (Stroke Patients Only)       Balance Overall balance assessment: Needs assistance Sitting-balance support: Feet supported;No upper extremity supported Sitting balance-Leahy Scale: Good Sitting balance - Comments: Able to fix socks sitting EOB reaching outside BoS without difficulty.    Standing balance support: During functional activity;Bilateral upper extremity supported Standing balance-Leahy Scale: Poor Standing balance comment: Reliant on UEs for support. Able to perform pericare without difficulty.                             Cognition Arousal/Alertness: Awake/alert Behavior During Therapy: WFL for tasks assessed/performed Overall Cognitive Status: History of cognitive impairments - at baseline                                        Exercises      General Comments General comments (skin integrity, edema, etc.): Daughter present during session.      Pertinent Vitals/Pain Pain Assessment: Faces Faces Pain Scale: No hurt    Home Living                      Prior Function            PT Goals (current goals can now be found  in the care plan section) Progress towards PT goals: Progressing toward goals    Frequency    Min 3X/week      PT Plan Current plan remains appropriate    Co-evaluation              AM-PAC PT "6 Clicks" Daily Activity  Outcome Measure  Difficulty turning over in bed (including adjusting bedclothes, sheets and blankets)?: None Difficulty moving from lying on back to sitting on the side of the bed? : None Difficulty sitting down on and standing up from a chair with arms (e.g., wheelchair, bedside commode, etc,.)?: None Help needed moving to and from a bed to chair (including a  wheelchair)?: A Little Help needed walking in hospital room?: A Little Help needed climbing 3-5 steps with a railing? : A Lot 6 Click Score: 20    End of Session Equipment Utilized During Treatment: Gait belt Activity Tolerance: Patient tolerated treatment well Patient left: in chair;with call bell/phone within reach;with family/visitor present Nurse Communication: Mobility status PT Visit Diagnosis: Unsteadiness on feet (R26.81);Muscle weakness (generalized) (M62.81);Repeated falls (R29.6)     Time: 2761-8485 PT Time Calculation (min) (ACUTE ONLY): 25 min  Charges:  $Gait Training: 8-22 mins $Therapeutic Exercise: 8-22 mins                    G Codes:       Wray Kearns, PT, DPT [phone]     Marguarite Arbour A Cadden Elizondo 07/18/2017, 11:15 AM

## 2017-07-18 NOTE — Discharge Summary (Addendum)
Discharge Summary    Patient ID: Brittney Gutierrez,  MRN: [id number], DOB/AGE: 01-01-1927 83 y.o.  Admit date: 07/09/2017 Discharge date: 07/18/2017   Primary Care Provider: Gayland Curry Primary Cardiologist: Dr. Sallyanne Kuster  Discharge Diagnoses    Principal Problem:   Near syncope Active Problems:   Atrial fibrillation with RVR (HCC)   Dementia   Essential hypertension   SSS (sick sinus syndrome) ([facility])   Current use of long term anticoagulation   Hypertensive cardiovascular disease   Expressive aphasia   Allergies Allergies  Allergen Reactions  . Bactrim [Sulfamethoxazole-Trimethoprim] Nausea Only  . Amiodarone Itching  . Amoxicillin Other (See Comments)    Unknown- ? Upset stomach  . Demerol [Meperidine] Other (See Comments)    Hallucinations  . Hydralazine Other (See Comments)    Tingling and chest pain   . Lisinopril Cough  . Zoloft [Sertraline Hcl] Other (See Comments)    Unknown  . Tape Rash and Other (See Comments)    Can tear the skin, also     History of Present Illness     Brittney Gutierrez is a 83 y.o. female with tachycardia-bradycardia syndrome, symptomatic persistent atrial fibrillation with difficult ventricular rate control, complicated by congestive heart failure and acute renal insufficiency.   She is on Eliquis-CHADS VASc=6. She was unable to tolerate beta blocker and diltiazem together in the past secondary to bradycardia. She was unable to tolerate Amiodarone previously secondary to itching. Myoview was low risk Sept 2016. Echo march 2017 showed an EF of 50-55% moderate LVH. LOV was 05/02/17 and she was in NSR 62 with occasional PVC.  She saw her PCP for UTI (06/2017). She was in the office 07/09/17 for f/u. She was in the waiting room and had a glass of water and suddenly became near syncopal. She was brought to an exam room and her HR was > 150. She was sent to [facility] via EMS. In the ED her HR was 170-AF with RVR. Her rate was better after a dose of IV  Diltiazem. She denied chest pain and was unaware of her AF. Her daughter reported that her mother has not been taking her medications correctly. They have found pills and they think she was taking the wrong pills at the wrong time.     Hospital Course     Consultants: none   Brittney Gutierrez was admitted and underwent elective TEE/DCCV (TEE since she may have missed doses of her eliquis). TEE/DCCV on 07/14/17 with successful cardioversion to NSR. She subsequently developed hypotension after cardioversion and received IV fluids. She then developed pulmonary edema. She was treated with IV diuretics. Her admission weight was 129 lbs. She was diuresed with IV diuretics which improved her breathing. Weight peaked at 148 lbs on 07/16/17, but is back down to 126 lbs at discharge. She continued to need O2 and was discharged on supplemental oxygen. She was also discharged with HHPT.   Acute on chronic diastolic heart failure Secondary to IVF for post DCCV hypotension. This resolved with diuretics and she is below her admission weight. Will not discharge on diuretics at this time. Monitor OP.   Afib She is maintaining sinus rhythm following DCCV. She was discharged on a higher dose of lopressor without bradycardia. She was also discharged on eliquis. In the future, avoid amiodarone for bradycardia.  CKD stage III Serum creatinine is near baseline. Discharge sCr was 1.20 (1.30, 1.26).  HTN Will continue same dose of norvasc and losartan decreased from 100mg  to  25mg  daily given higher dose of BB. Continue to monitor OP.  Dementia Episodes of confusion and possible sundowning this admission. Ordered HHPT for deconditioning. Continue aricept.  Patient seen and examined by Dr. Sallyanne Kuster today and was stable for discharge. All follow up has been arranged.  _____________  Discharge Vitals Blood pressure (!) 157/52, pulse (!) 57, temperature (!) 97.5 F (36.4 C), temperature source Oral, resp. rate (!) 22, height  5\' 2"  (1.575 m), weight 126 lb 11.2 oz (57.5 kg), SpO2 98 %.  Filed Weights   07/14/17 0325 07/17/17 0356 07/18/17 0402  Weight: 148 lb (67.1 kg) 128 lb 8 oz (58.3 kg) 126 lb 11.2 oz (57.5 kg)    Labs & Radiologic Studies    CBC No results for input(s): WBC, NEUTROABS, HGB, HCT, MCV, PLT in the last 72 hours. Basic Metabolic Panel  Recent Labs  07/17/17 0244 07/18/17 0230  NA 133* 131*  K 4.4 4.0  CL 96* 97*  CO2 24 26  GLUCOSE 94 71  BUN 25* 22*  CREATININE 1.33* 1.20*  CALCIUM 8.7* 8.7*  MG 1.7  --    Liver Function Tests No results for input(s): AST, ALT, ALKPHOS, BILITOT, PROT, ALBUMIN in the last 72 hours. No results for input(s): LIPASE, AMYLASE in the last 72 hours. Cardiac Enzymes No results for input(s): CKTOTAL, CKMB, CKMBINDEX, TROPONINI in the last 72 hours. BNP Invalid input(s): POCBNP D-Dimer No results for input(s): DDIMER in the last 72 hours. Hemoglobin A1C No results for input(s): HGBA1C in the last 72 hours. Fasting Lipid Panel No results for input(s): CHOL, HDL, LDLCALC, TRIG, CHOLHDL, LDLDIRECT in the last 72 hours. Thyroid Function Tests No results for input(s): TSH, T4TOTAL, T3FREE, THYROIDAB in the last 72 hours.  Invalid input(s): FREET3 _____________  Dg Chest 2 View  Result Date: 07/12/2017 CLINICAL DATA:  Hx of V/Q scan.  Positive D-dimer. EXAM: CHEST  2 VIEW COMPARISON:  12/05/2016 FINDINGS: The heart is enlarged. There is prominence of interstitial markings. Airspace filling opacities are identified at the bases, left greater than right. There are small bilateral pleural effusions.There is atherosclerotic calcification of the thoracic aorta. Chronic changes in the right shoulder compatible with chronic rotator cuff injury. IMPRESSION: Findings most compatible with congestive heart failure. Superimposed infectious process could have a similar appearance. Electronically Signed   By: Nolon Nations M.D.   On: 07/12/2017 17:35   Mr Brain Wo  Contrast  Result Date: 07/11/2017 CLINICAL DATA:  83 y/o  F; altered mental status of unclear cause. EXAM: MRI HEAD WITHOUT CONTRAST TECHNIQUE: Multiplanar, multiecho pulse sequences of the brain and surrounding structures were obtained without intravenous contrast. COMPARISON:  12/26/2015 CT head FINDINGS: Extensive motion degradation on multiple sequences. Brain: No acute infarction, hemorrhage, hydrocephalus, extra-axial collection or mass lesion. Small focus of encephalomalacia in the left lateral parietal lobe. Moderate chronic microvascular ischemic changes of white matter and parenchymal volume loss of the brain. Vascular: Normal flow voids. Skull and upper cervical spine: Susceptibility artifact from surgical changes related to left parietal craniotomy. Sinuses/Orbits: Right maxillary sinus opacification. No abnormal signal of mastoid air cells. Bilateral intra-ocular lens replacement. Other: None. IMPRESSION: 1. No acute intracranial abnormality. 2. Moderate chronic microvascular ischemic changes and moderate parenchymal volume loss of the brain. 3. Stable left parietal encephalomalacia and craniotomy. 4. Right maxillary sinus opacification. Electronically Signed   By: Kristine Garbe M.D.   On: 07/11/2017 15:05   Nm Pulmonary Perf And Vent  Result Date: 07/12/2017 CLINICAL DATA:  Suspected  pulmonary embolus.  Positive D-dimer. EXAM: NUCLEAR MEDICINE VENTILATION - PERFUSION LUNG SCAN TECHNIQUE: Ventilation images were obtained in multiple projections using inhaled aerosol Tc-76m DTPA. Perfusion images were obtained in multiple projections after intravenous injection of Tc-52m MAA. RADIOPHARMACEUTICALS:  31.0 MCi Technetium-51m DTPA aerosol inhalation and 4.2 mCi Technetium-25m MAA IV COMPARISON:  Chest x-ray 07/12/2017 FINDINGS: Ventilation: There is patchy ventilation, consistent with airspace disease is seen on chest x-ray. Perfusion: No wedge shaped peripheral perfusion defects to suggest  acute pulmonary embolism. Additional:  The patient is scanned with arms at her side. IMPRESSION: 1. Low probability for clinically significant pulmonary embolus. 2. Patchy ventilation, consistent with airspace filling/edema. Electronically Signed   By: Nolon Nations M.D.   On: 07/12/2017 16:58   Dg Chest Port 1 View  Result Date: 07/16/2017 CLINICAL DATA:  Progressive dyspnea EXAM: PORTABLE CHEST 1 VIEW COMPARISON:  07/12/2017 FINDINGS: Obscuration of the cardiac silhouette secondary to layering bilateral effusions left greater than right. Mild interstitial edema. Chronic interstitial prominence of the lungs. Superimposed pneumonia would be difficult to entirely exclude especially on the left due to superimposed left effusion. The aorta is obscured by patient rotation and overlap with the dorsal spine. There appears be aortic atherosclerosis however. There is osteoarthritic joint space narrowing and sclerosis of the AC and glenohumeral joints with high-riding humeral head narrowing the impingement space for the rotator cuff. This is chronic. Thoracic spondylosis is noted. IMPRESSION: Progression of CHF and layering effusions since prior exam. Superimposed pneumonia would be difficult to entirely exclude. Electronically Signed   By: Ashley Royalty M.D.   On: 07/16/2017 00:49     Diagnostic Studies/Procedures    TEE/DCCV 07/14/17: Study Conclusions - Left ventricle: There was mild concentric hypertrophy. Systolic   function was normal. The estimated ejection fraction was in the   range of 50% to 55%. Although no diagnostic regional wall motion   abnormality was identified, this possibility cannot be completely   excluded on the basis of this study. No evidence of thrombus. - Aortic valve: No evidence of vegetation. There was trivial   regurgitation. - Aorta: Grade 2 atheroma. - Mitral valve: Moderate regurgitation. Mildly thickened leaflets . - Left atrium: The atrium was dilated. No evidence of  thrombus in   the atrial cavity or appendage. No evidence of thrombus in the   atrial cavity or appendage. - Right atrium: No evidence of thrombus in the atrial cavity or   appendage. - Atrial septum: No defect or patent foramen ovale was identified. - Pulmonic valve: No evidence of vegetation.  Impressions: - Successful cardioversion. No cardiac source of emboli was   indentified.   Echocardiogram 07/11/17: Study Conclusions - Left ventricle: The cavity size was normal. Wall thickness was   increased in a pattern of moderate LVH. Systolic function was   normal. The estimated ejection fraction was in the range of 50%   to 55%. Wall motion was normal; there were no regional wall   motion abnormalities. The study is not technically sufficient to   allow evaluation of LV diastolic function. - Aortic valve: There was mild regurgitation. - Mitral valve: Calcified annulus. There was mild regurgitation. - Left atrium: The atrium was moderately dilated. - Pulmonary arteries: Systolic pressure was mildly to moderately   increased. PA peak pressure: 48 mm Hg (S).  Impressions: - Normal LV systolic function; moderate LVH; sclerotic aortic valve   with mild AI; mild MR; moderate LAE; mild TR with mild to   moderate elevation in  pulmonary pressure.   Disposition   Pt is being discharged home today in good condition.  Follow-up Plans & Appointments    Follow-up Information    [facility]-Home Follow up.   Why:  Home Health agency will contact you to arrange initial appoitment Contact information: [place] [address] [phone]        [facility] - Dme Follow up.   Why:  [facility] will contact you to deliver oxygen concentrator to your home when you arrive.  Contact information: [place] [address] [phone]        Richardson Dopp T, PA-C Follow up on 07/23/2017.   Specialties:  Cardiology, Physician  Assistant Why:  9:15 am for TCM Contact information: [address] [place] [address] [phone]          Discharge Instructions    Diet - low sodium heart healthy    Complete by:  As directed    Diet - low sodium heart healthy    Complete by:  As directed    Increase activity slowly    Complete by:  As directed    Increase activity slowly    Complete by:  As directed       Discharge Medications   Discharge Medication List as of 07/18/2017  2:26 PM    START taking these medications   Details  donepezil (ARICEPT) 10 MG tablet Take 1 tablet (10 mg total) by mouth at bedtime., Starting Fri 07/18/2017, Normal      CONTINUE these medications which have CHANGED   Details  Metoprolol Tartrate 75 MG TABS Take 75 mg by mouth 2 (two) times daily., Starting Fri 07/18/2017, Normal    amLODipine (NORVASC) 5 MG tablet Take 5mg  by mouth daily., Starting Fri 07/18/2017, No Print      CONTINUE these medications which have NOT CHANGED   Details  acetaminophen (TYLENOL) 325 MG tablet Take 650 mg by mouth every 6 (six) hours as needed for mild pain., Historical Med    Cholecalciferol (VITAMIN D3) 2000 units TABS Take 2,000 Units by mouth daily. , Historical Med    docusate sodium (COLACE) 100 MG capsule Take 100 mg by mouth 2 (two) times daily. , Historical Med    venlafaxine (EFFEXOR) 37.5 MG tablet Take 1 tablet (37.5 mg total) by mouth 2 (two) times daily., Starting Thu 05/01/2017, Normal    losartan (COZAAR) 25 MG tablet Take 1 tablet (25 mg total) by mouth daily., Starting Thu 05/01/2017, Normal    apixaban (ELIQUIS) 2.5 MG TABS tablet Take 1 tablet (2.5 mg total) by mouth 2 (two) times daily., Starting Thu 05/01/2017, Normal           Outstanding Labs/Studies     Duration of Discharge Encounter   Greater than 30 minutes including physician time.  Signed, Angelena Form PA-C 07/18/2017, 6:31 PM

## 2017-07-22 ENCOUNTER — Telehealth: Payer: Self-pay | Admitting: *Deleted

## 2017-07-22 ENCOUNTER — Telehealth: Payer: Self-pay | Admitting: Cardiovascular Disease

## 2017-07-22 NOTE — Telephone Encounter (Signed)
New message    Pt c/o medication issue:  1. Name of Medication: Metoprolol Tartrate 75 MG TABS  2. How are you currently taking this medication (dosage and times per day)? 75mg   3. Are you having a reaction (difficulty breathing--STAT)? no  4. What is your medication issue? Pt daughter states this increase of this medication is causing confusion and itching. She requests a call back

## 2017-07-22 NOTE — Progress Notes (Signed)
Cardiology Office Note:    Date:  07/23/2017   ID:  [name], DOB 05-28-1927, MRN [id number]  PCP:  Gayland Curry, DO  Cardiologist:  Dr. Sanda Klein    Referring MD: Gayland Curry, DO   Chief Complaint  Patient presents with  . Hospitalization Follow-up    AF with RVR >> s/p DCCV    History of Present Illness:    Brittney Gutierrez is a 83 y.o. female with a hx of HTN, parox AF, bradycardia, dementia.  The patient has declined pursuing pacemaker implant in the past.  She has had difficult to control ventricular rate with atrial fibrillation. She could not tolerate Amiodarone in the past due to itching.  A Cardiac Catheterization in 2017 demonstrated mild non-obstructive CAD and 50% R renal artery stenosis.  Last seen by Dr. Sanda Klein in 5/18.    Admitted 8/1-8/10 with symptomatic AF with RVR.  She underwent TEE-DCCV with successful restoration of normal sinus rhythm.  She had post DCCV hypotension that was treated with IVFs. She then developed acute diastolic CHF and AKI.  She was diuresed with IV Lasix and did not require oral diuretics at DC.  She was set up for home O2 on DC due to hypoxia.  Creatinine was back to baseline at DC.    Brittney Gutierrez returns for post hospital follow up.  She is here with her daughter.  Brittney Gutierrez lives in independent living at [facility]. She has had PT coming to her home but has met her goals and is being DC'd. She has been doing well since DC without shortness of breath, chest pain, dizziness, near syncope, orthopnea, paroxysmal nocturnal dyspnea, edema, cough.  She has not had any bleeding issues.  She never used the O2 after DC.  She has been more confused since DC. She needs a refill on Aricept that was resumed at DC.  She sees her PCP soon for follow up.    Prior CV studies:   The following studies were reviewed today:  TEE 07/14/17 Mild conc LVH, EF 50-55, aorta grade 2 atheroma, mod MR, LAE, no LAA clot  Echo 07/11/17 Mod  LVH, EF 50-55, no RWMA, mild AI, MAC, mild MR, mod LAE, PASP 48  Cardiac Catheterization 5/17 LM ost 20 LAD patent LCx mid 30 RCA prox 30 L RA patent, R RA prox 50% but patent  Echo 3/17 Mod LVH, EF 50-55, no RWWA, mild AI, MAC, mild MR, PASP 39  Past Medical History:  Diagnosis Date  . Abnormal weight loss 05/28/2004  . Anxiety 01/17/2003  . Back pain 08/19/2005   with radiculopathy  . Bradycardia, drug induced    Diltiazem  . Cerebral atherosclerosis 09/24/1999  . Chest pain, atypical 04/2016   minor CAD at cath   . Chronic anticoagulation    Eliquis  . Dementia    "don't know kind or stage" (05/02/2016)  . Depression 09/06/2002  . Diverticulitis 09/28/2007  . External hemorrhoids 04/12/2009  . Female climacteric state 03/04/2000  . 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 GERD (gastroesophageal reflux disease)   . Hypertensive cardiovascular disease    moderate LVH on echo march 2017  . Macular degeneration   . Malaise and fatigue   . 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 Osteoarthrosis, localized   . Osteoporosis 05/28/2004  . Paranoia ([place])   . Paroxysmal A-fib ([place])   . TIA (  transient ischemic attack) 2000s?  [name] Urinary incontinence 08/18/2007  . UTI (urinary tract infection)   . Vaginitis, atrophic   . Vertigo, peripheral 10/07/2000    Past Surgical History:  Procedure Laterality Date  . BRAIN SURGERY  2000   Neurocysticercosis ("parasite")  . CARDIAC CATHETERIZATION N/A 05/03/2016   Procedure: Left Heart Cath and Coronary Angiography;  Surgeon: Sherren Mocha, MD;  Location: [place] CV LAB;  Service: Cardiovascular;  Laterality: N/A;  . CARDIOVERSION N/A 07/14/2017   Procedure: CARDIOVERSION;  Surgeon: Pixie Casino, MD;  Location: [facility] ENDOSCOPY;  Service: Cardiovascular;  Laterality: N/A;  . CATARACT EXTRACTION  2013  . CATARACT EXTRACTION, BILATERAL     "not sure if both eyes; feel like  it probably was"  . JOINT REPLACEMENT    . PERIPHERAL VASCULAR CATHETERIZATION N/A 05/03/2016   Procedure: Abdominal Aortogram;  Surgeon: Sherren Mocha, MD;  Location: [place] CV LAB;  Service: Cardiovascular;  Laterality: N/A;  . SHOULDER ARTHROSCOPY W/ ROTATOR CUFF REPAIR Right X 3  . TEE WITHOUT CARDIOVERSION N/A 07/14/2017   Procedure: TRANSESOPHAGEAL ECHOCARDIOGRAM (TEE);  Surgeon: Pixie Casino, MD;  Location: [place];  Service: Cardiovascular;  Laterality: N/A;  . TOTAL KNEE ARTHROPLASTY Left 1990  . VAGINAL HYSTERECTOMY  1960    Current Medications: Current Meds  Medication Sig  . acetaminophen (TYLENOL) 325 MG tablet Take 650 mg by mouth every 6 (six) hours as needed for mild pain.  [name] amLODipine (NORVASC) 5 MG tablet Take 1 tablet (5 mg total) by mouth daily.  [name] apixaban (ELIQUIS) 2.5 MG TABS tablet Take 1 tablet (2.5 mg total) by mouth 2 (two) times daily.  . Cholecalciferol (VITAMIN D3) 2000 units TABS Take 2,000 Units by mouth daily.   [name] docusate sodium (COLACE) 100 MG capsule Take 100 mg by mouth 2 (two) times daily.   [name] donepezil (ARICEPT) 10 MG tablet Take 1 tablet (10 mg total) by mouth daily.  [name] losartan (COZAAR) 25 MG tablet Take 1 tablet (25 mg total) by mouth daily.  [name] venlafaxine (EFFEXOR) 37.5 MG tablet Take 1 tablet (37.5 mg total) by mouth 2 (two) times daily.  . [DISCONTINUED] Metoprolol Tartrate 75 MG TABS Take 75 mg by mouth 2 (two) times daily.     Allergies:   Bactrim [sulfamethoxazole-trimethoprim]; Amiodarone; Amoxicillin; Demerol [meperidine]; Hydralazine; Lisinopril; Zoloft [sertraline hcl]; and Tape   Social History  Substance Use Topics  . Smoking status: Never Smoker  . Smokeless tobacco: Never Used  . Alcohol use No    Family Hx: The patient's family history includes Heart attack in her father; Hypertension in her sister; Polycystic kidney disease in her mother. There is no history of Dementia.  ROS:   Please see the history of  present illness.    Review of Systems  Eyes: Positive for visual disturbance.   All other systems reviewed and are negative.   EKGs/Labs/Other Test Reviewed:    EKG:  EKG is  ordered today.  The ekg ordered today demonstrates Sinus bradycardia, HR 53, left axis deviation, nonspecific ST-T wave changes, QTc 427 ms, PVCs  Recent Labs: 11/27/2016: B Natriuretic Peptide 304.2 05/01/2017: ALT 11 06/27/2017: TSH 5.35 07/09/2017: Hemoglobin 13.9; Hemoglobin 11.4; Platelets 242 07/17/2017: Magnesium 1.7 07/18/2017: BUN 22; Creatinine, Ser 1.20; Potassium 4.0; Sodium 131   Recent Lipid Panel Lab Results  Component Value Date/Time   CHOL 195 05/02/2016 07:32 AM   TRIG 49 05/02/2016 07:32 AM   HDL 65 05/02/2016 07:32 AM   CHOLHDL 3.0  05/02/2016 07:32 AM   LDLCALC 120 (H) 05/02/2016 07:32 AM    Physical Exam:    VS:  BP (!) 130/58   Pulse (!) 53   Ht _0  (1.575 m)   Wt 121 lb 1 oz (54.9 kg)   BMI 22.14 kg/m     Wt Readings from Last 3 Encounters:  07/23/17 121 lb 1 oz (54.9 kg)  07/18/17 126 lb 11.2 oz (57.5 kg)  07/03/17 129 lb (58.5 kg)     Physical Exam  Constitutional: She is oriented to person, place, and time. She appears well-developed and well-nourished. No distress.  HENT:  Head: Normocephalic and atraumatic.  Eyes: No scleral icterus.  Neck: Normal range of motion. No JVD present.  Cardiovascular: Normal rate, regular rhythm, S1 normal and S2 normal.   Murmur heard.  Holosystolic murmur is present with a grade of 3/6  at the lower left sternal border Pulmonary/Chest: Breath sounds normal. She has no wheezes. She has no rhonchi. She has no rales.  Abdominal: Soft. There is no tenderness.  Musculoskeletal: She exhibits no edema.  Neurological: She is alert and oriented to person, place, and time.  Skin: Skin is warm and dry.  Psychiatric: She has a normal mood and affect.    ASSESSMENT:    1. Persistent atrial fibrillation ([place])   2. SSS (sick sinus syndrome)  ([place] [place])   3. Chronic diastolic heart failure ([place])   4. Essential hypertension   5. Vascular dementia without behavioral disturbance    PLAN:    In order of problems listed above:  1. Persistent atrial fibrillation (HCC) -  Maintaining normal sinus rhythm after recent TEE guided cardioversion she is tolerating anticoagulation with Apixaban 2.5 mg twice a day (age 83, weight 55 kg). She has been more fatigued and has had issues with more confusion. Her heart rate is in the lower 50s. She denies dizziness or near syncope.  -  Decrease metoprolol to 50 mg twice a day.  2. SSS (sick sinus syndrome) ([place]) As noted, she has had issues with bradycardia in the past. Her heart rate is 53 on a combination of donepezil and metoprolol. Decrease metoprolol as outlined above.  3. Chronic diastolic heart failure (HCC) -  Volume is stable. She is not on any diuretics. Oxygen was evaluated with ambulation today. Her O2 sat on room air remained 95-97%. We will discontinue her home oxygen. Obtain follow-up BMET today.  4. Essential hypertension The patient's blood pressure is controlled on her current regimen.  Continue current therapy.    5. Vascular dementia without behavioral disturbance She needs a refill on her donepezil. I have sent this to her local pharmacy for #30. She will get future refills from her primary care provider.  Hopefully, with reduction in her beta blocker, some of her confusion and fatigue will improve.  Dispo:  Return in about 8 weeks (around 09/17/2017) for Close Follow Up w/ Dr. Sallyanne Kuster.   Medication Adjustments/Labs and Tests Ordered: Current medicines are reviewed at length with the patient today.  Concerns regarding medicines are outlined above.  Tests Ordered: Orders Placed This Encounter  Procedures  . Basic Metabolic Panel (BMET)  . EKG 12-Lead   Medication Changes: Meds ordered this encounter  Medications  . donepezil (ARICEPT) 10 MG tablet    Sig: Take 1 tablet  (10 mg total) by mouth daily.    Dispense:  30 tablet    Refill:  0    Future refills to come from primary  care provider.    Order Specific Question:   Supervising Provider    Answer:   Lauree Chandler D [[id number]]  . metoprolol tartrate (LOPRESSOR) 50 MG tablet    Sig: Take 1 tablet (50 mg total) by mouth 2 (two) times daily.    Dispense:  180 tablet    Refill:  3    DECREASE IN DOSE    Signed, Richardson Dopp, PA-C  07/23/2017 10:01 AM    [facility] [place], [place], [place]  [address] Phone: [phone]; Fax: [phone]

## 2017-07-22 NOTE — Telephone Encounter (Signed)
Well that's different.  I though she was going to need to move to assisted living soon.

## 2017-07-22 NOTE — Telephone Encounter (Signed)
Returned call to daughter/DPR. Phone rings w/o answer, no means to leave msg. Unsure if she is aware patient has a scheduled TOC appt tomorrow w Richardson Dopp.

## 2017-07-22 NOTE — Telephone Encounter (Signed)
Tera Partridge with [facility] called and stated that patient is doing well and they are not going to Admit to their services.

## 2017-07-23 ENCOUNTER — Ambulatory Visit (INDEPENDENT_AMBULATORY_CARE_PROVIDER_SITE_OTHER): Payer: Medicare Other | Admitting: Physician Assistant

## 2017-07-23 ENCOUNTER — Encounter: Payer: Self-pay | Admitting: Physician Assistant

## 2017-07-23 ENCOUNTER — Other Ambulatory Visit: Payer: Self-pay | Admitting: Internal Medicine

## 2017-07-23 VITALS — BP 130/58 | HR 53 | Ht 62.0 in | Wt 121.1 lb

## 2017-07-23 DIAGNOSIS — I481 Persistent atrial fibrillation: Secondary | ICD-10-CM | POA: Diagnosis not present

## 2017-07-23 DIAGNOSIS — I10 Essential (primary) hypertension: Secondary | ICD-10-CM

## 2017-07-23 DIAGNOSIS — F0150 Vascular dementia without behavioral disturbance: Secondary | ICD-10-CM

## 2017-07-23 DIAGNOSIS — F32A Depression, unspecified: Secondary | ICD-10-CM

## 2017-07-23 DIAGNOSIS — I495 Sick sinus syndrome: Secondary | ICD-10-CM | POA: Diagnosis not present

## 2017-07-23 DIAGNOSIS — I5032 Chronic diastolic (congestive) heart failure: Secondary | ICD-10-CM

## 2017-07-23 DIAGNOSIS — I4819 Other persistent atrial fibrillation: Secondary | ICD-10-CM

## 2017-07-23 DIAGNOSIS — F329 Major depressive disorder, single episode, unspecified: Secondary | ICD-10-CM

## 2017-07-23 DIAGNOSIS — F419 Anxiety disorder, unspecified: Principal | ICD-10-CM

## 2017-07-23 LAB — BASIC METABOLIC PANEL
BUN / CREAT RATIO: 20 (ref 12–28)
BUN: 23 mg/dL (ref 8–27)
CO2: 23 mmol/L (ref 20–29)
CREATININE: 1.14 mg/dL — AB (ref 0.57–1.00)
Calcium: 9.2 mg/dL (ref 8.7–10.3)
Chloride: 97 mmol/L (ref 96–106)
GFR calc Af Amer: 50 mL/min/{1.73_m2} — ABNORMAL LOW (ref >59)
GFR, EST NON AFRICAN AMERICAN: 43 mL/min/{1.73_m2} — AB (ref >59)
GLUCOSE: 84 mg/dL (ref 65–99)
POTASSIUM: 4.2 mmol/L (ref 3.5–5.2)
SODIUM: 134 mmol/L (ref 134–144)

## 2017-07-23 MED ORDER — METOPROLOL TARTRATE 50 MG PO TABS: 50.0000 mg | ORAL_TABLET | Freq: Two times a day (BID) | ORAL | 3 refills | Status: DC

## 2017-07-23 MED ORDER — DONEPEZIL HCL 10 MG PO TABS: 10.0000 mg | ORAL_TABLET | Freq: Every day | ORAL | 0 refills | Status: DC

## 2017-07-23 NOTE — Telephone Encounter (Signed)
Pt at appt at [facility]

## 2017-07-23 NOTE — Progress Notes (Signed)
Agree with decreasing metoprolol. Thank you, Air traffic controller

## 2017-07-23 NOTE — Patient Instructions (Addendum)
Medication Instructions:  1. DECREASE METOPROLOL TARTRATE TO 50 MG TWICE DAILY, NEW RX HAS BEEN SENT IN; YOU CAN USE THE METOPROLOL 25 MG TABLETS YOU HAVE ALREADY ON HAND BY TAKING 2 TABLETS IN THE MORNING AND 2 TABLETS IN THE EVENING   2. A ONE TIME PRESCRIPTION WAS SENT IN FOR THE ARICEPT; FURTHER REFILLS TO BE DONE BY PRIMARY CARE  Labwork: TODAY BMET  Testing/Procedures: NONE ORDERED   Follow-Up: DR. Sallyanne Kuster AT [facility] IN 6-8 WEEKS   Any Other Special Instructions Will Be Listed Below (If Applicable).  PER SCOTT WEAVER, PAC OK TO DISCONTINUE HOME OXYGEN; WE HAVE GIVEN YOU A PRESCRIPTION TO GIVE TO THE NURSE AT YOUR RESIDENCE TO SHOW OXYGEN TO BE DISCONTINUED    If you need a refill on your cardiac medications before your next appointment, please call your pharmacy.

## 2017-07-25 ENCOUNTER — Encounter: Payer: Self-pay | Admitting: Nurse Practitioner

## 2017-07-25 ENCOUNTER — Ambulatory Visit (INDEPENDENT_AMBULATORY_CARE_PROVIDER_SITE_OTHER): Payer: Medicare Other | Admitting: Nurse Practitioner

## 2017-07-25 VITALS — BP 128/74 | HR 63 | Temp 98.0°F | Resp 17 | Ht 62.0 in | Wt 127.6 lb

## 2017-07-25 DIAGNOSIS — N183 Chronic kidney disease, stage 3 unspecified: Secondary | ICD-10-CM

## 2017-07-25 DIAGNOSIS — I481 Persistent atrial fibrillation: Secondary | ICD-10-CM | POA: Diagnosis not present

## 2017-07-25 DIAGNOSIS — I4819 Other persistent atrial fibrillation: Secondary | ICD-10-CM

## 2017-07-25 DIAGNOSIS — G301 Alzheimer's disease with late onset: Secondary | ICD-10-CM | POA: Diagnosis not present

## 2017-07-25 DIAGNOSIS — F02818 Dementia in other diseases classified elsewhere, unspecified severity, with other behavioral disturbance: Secondary | ICD-10-CM

## 2017-07-25 DIAGNOSIS — F0281 Dementia in other diseases classified elsewhere with behavioral disturbance: Secondary | ICD-10-CM | POA: Diagnosis not present

## 2017-07-25 DIAGNOSIS — I10 Essential (primary) hypertension: Secondary | ICD-10-CM | POA: Diagnosis not present

## 2017-07-25 DIAGNOSIS — I5032 Chronic diastolic (congestive) heart failure: Secondary | ICD-10-CM

## 2017-07-25 MED ORDER — DONEPEZIL HCL 10 MG PO TABS: 10.0000 mg | ORAL_TABLET | Freq: Every day | ORAL | 0 refills | Status: DC

## 2017-07-25 NOTE — Progress Notes (Signed)
Careteam: Patient Care Team: Gayland Curry, DO as PCP - General (Geriatric Medicine) Sanda Klein, MD as Consulting Physician (Cardiology) Sherlynn Stalls, MD as Consulting Physician (Ophthalmology) Murriel Hopper, MD as Referring Physician (Student)  Advanced Directive information Does Patient Have a Medical Advance Directive?: No  Allergies  Allergen Reactions  . Bactrim [Sulfamethoxazole-Trimethoprim] Nausea Only  . Amiodarone Itching  . Amoxicillin Other (See Comments)    Unknown- ? Upset stomach  . Demerol [Meperidine] Other (See Comments)    Hallucinations  . Hydralazine Other (See Comments)    Tingling and chest pain   . Lisinopril Cough  . Zoloft [Sertraline Hcl] Other (See Comments)    Unknown  . Tape Rash and Other (See Comments)    Can tear the skin, also    Chief Complaint  Patient presents with  . Hospitalization Follow-up    Pt is being seen to follow up on hospital stay at [facility] 07/09/17 to 07/18/17 due to Afib with RVR  . Other    Daughter, [name], is in room     HPI: Patient is a 83 y.o. female seen in the office today for hospital follow up. Pt was found to be in a fib in RVR during OV therefore sent to the ED via EMS She was sent to [facility] via EMS. In the ED her HR was 170-AF with RVR. Her rate was better after a dose of IV Diltiazem. She denied chest pain and was unaware of her AF. Her daughter reported that her mother has not been taking her medications correctly. They have found pills and they think she was taking the wrong pills at the wrong time. Brittney Gutierrez was admitted and underwent elective TEE/DCCV (TEE since she may have missed doses of her eliquis). TEE/DCCV on 07/14/17 with successful cardioversion to NSR. She subsequently developed hypotension after cardioversion and received IV fluids. She then developed pulmonary edema. She was treated with IV diuretics. Her admission weight was 129 lbs. She was diuresed with IV diuretics which improved her  breathing. Weight peaked at 148 lbs on 07/16/17, but is back down to 126 lbs at discharge. She continued to need O2 and was discharged on supplemental oxygen but currently off any supplemental O2.   Saw cardiologist to follow up 2 days ago and due to bradycardia reduced metoprolol to 50 mg BID.  Volume status has been stable. No increase swelling or shortness of breath.  [facility] has had someone with her 24/7 for 1 week but tomorrow she will be back to being alone.  They do have a service coming in to help her with morning medication, bath and breakfast. They will also come back in the evening to give her medication, snack and help her get ready for bed.  Pt and family feel like the hospitalization has caused increase confusion but this has improved. Feel like she will be okay on her own.  Home health she did not meet criteria for service therefor the agency will help at this time.   Aricept was started at 5 mg in the hospital to see if there was any effect on HR which there was not there it was increased to 10 mg during hospitalization and she has been tolerating well.   Review of Systems:  Review of Systems  Constitutional: Negative for chills, fever and weight loss.  HENT: Negative for tinnitus.   Respiratory: Negative for cough, sputum production and shortness of breath.   Cardiovascular: Negative for chest  pain, palpitations and leg swelling.  Gastrointestinal: Negative for abdominal pain, constipation, diarrhea and heartburn.  Genitourinary: Negative for dysuria, frequency and urgency.  Musculoskeletal: Negative for back pain, falls, joint pain and myalgias.  Skin: Negative.   Neurological: Positive for weakness (generalized weakness but has significantly improved). Negative for dizziness and headaches.  Psychiatric/Behavioral: Positive for memory loss.    Past Medical History:  Diagnosis Date  . Abnormal weight loss 05/28/2004  . Anxiety 01/17/2003  . Back pain 08/19/2005    with radiculopathy  . Bradycardia, drug induced    Diltiazem  . Cerebral atherosclerosis 09/24/1999  . Chest pain, atypical 04/2016   minor CAD at cath   . Chronic anticoagulation    Eliquis  . Dementia    "don't know kind or stage" (05/02/2016)  . Depression 09/06/2002  . Diverticulitis 09/28/2007  . External hemorrhoids 04/12/2009  . Female climacteric state 03/04/2000  . 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 GERD (gastroesophageal reflux disease)   . Hypertensive cardiovascular disease    moderate LVH on echo march 2017  . Macular degeneration   . Malaise and fatigue   . 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 Osteoarthrosis, localized   . Osteoporosis 05/28/2004  . Paranoia ([place])   . Paroxysmal A-fib ([place])   . TIA (transient ischemic attack) 2000s?  [name] Urinary incontinence 08/18/2007  . UTI (urinary tract infection)   . Vaginitis, atrophic   . Vertigo, peripheral 10/07/2000   Past Surgical History:  Procedure Laterality Date  . BRAIN SURGERY  2000   Neurocysticercosis ("parasite")  . CARDIAC CATHETERIZATION N/A 05/03/2016   Procedure: Left Heart Cath and Coronary Angiography;  Surgeon: Sherren Mocha, MD;  Location: [place] CV LAB;  Service: Cardiovascular;  Laterality: N/A;  . CARDIOVERSION N/A 07/14/2017   Procedure: CARDIOVERSION;  Surgeon: Pixie Casino, MD;  Location: [facility] ENDOSCOPY;  Service: Cardiovascular;  Laterality: N/A;  . CATARACT EXTRACTION  2013  . CATARACT EXTRACTION, BILATERAL     "not sure if both eyes; feel like it probably was"  . JOINT REPLACEMENT    . PERIPHERAL VASCULAR CATHETERIZATION N/A 05/03/2016   Procedure: Abdominal Aortogram;  Surgeon: Sherren Mocha, MD;  Location: [place] CV LAB;  Service: Cardiovascular;  Laterality: N/A;  . SHOULDER ARTHROSCOPY W/ ROTATOR CUFF REPAIR Right X 3  . TEE WITHOUT CARDIOVERSION N/A 07/14/2017   Procedure: TRANSESOPHAGEAL  ECHOCARDIOGRAM (TEE);  Surgeon: Pixie Casino, MD;  Location: [place];  Service: Cardiovascular;  Laterality: N/A;  . TOTAL KNEE ARTHROPLASTY Left 1990  . VAGINAL HYSTERECTOMY  1960   Social History:   reports that she has never smoked. She has never used smokeless tobacco. She reports that she does not drink alcohol or use drugs.  Family History  Problem Relation Age of Onset  . Polycystic kidney disease Mother   . Heart attack Father   . Hypertension Sister   . Dementia Neg Hx     Medications: Patient's Medications  New Prescriptions   No medications on file  Previous Medications   ACETAMINOPHEN (TYLENOL) 325 MG TABLET    Take 650 mg by mouth every 6 (six) hours as needed for mild pain.   AMLODIPINE (NORVASC) 5 MG TABLET    Take 1 tablet (5 mg total) by mouth daily.   APIXABAN (ELIQUIS) 2.5 MG TABS TABLET    Take 1 tablet (2.5 mg total) by mouth 2 (two) times daily.  CHOLECALCIFEROL (VITAMIN D3) 2000 UNITS TABS    Take 2,000 Units by mouth daily.    DOCUSATE SODIUM (COLACE) 100 MG CAPSULE    Take 100 mg by mouth 2 (two) times daily.    LOSARTAN (COZAAR) 25 MG TABLET    Take 1 tablet (25 mg total) by mouth daily.   METOPROLOL TARTRATE (LOPRESSOR) 50 MG TABLET    Take 1 tablet (50 mg total) by mouth 2 (two) times daily.   VENLAFAXINE (EFFEXOR) 37.5 MG TABLET    TAKE 1 TABLET TWICE DAILY  Modified Medications   Modified Medication Previous Medication   DONEPEZIL (ARICEPT) 10 MG TABLET donepezil (ARICEPT) 10 MG tablet      Take 1 tablet (10 mg total) by mouth daily.    Take 1 tablet (10 mg total) by mouth daily.  Discontinued Medications   No medications on file     Physical Exam:  Vitals:   07/25/17 1118  BP: 128/74  Pulse: 63  Resp: 17  Temp: 98 F (36.7 C)  TempSrc: Oral  SpO2: 98%  Weight: 127 lb 9.6 oz (57.9 kg)  Height: 5\' 2"  (1.575 m)   Body mass index is 23.34 kg/m.  Physical Exam  Constitutional: She appears well-developed and well-nourished. No  distress.  HENT:  Mouth/Throat: Oropharynx is clear and moist.  Neck: Normal range of motion.  Cardiovascular: Normal rate, regular rhythm and normal heart sounds.   Pulmonary/Chest: Effort normal and breath sounds normal. No respiratory distress.  Abdominal: Soft. Bowel sounds are normal. She exhibits no distension. There is no tenderness.  Musculoskeletal: Normal range of motion. She exhibits no tenderness.  Neurological: She is alert.  oriented to self only  Skin: Skin is warm and dry. Capillary refill takes less than 2 seconds.  Psychiatric: She has a normal mood and affect.    Labs reviewed: Basic Metabolic Panel:  Recent Labs  06/27/17 1208  07/11/17 0233  07/17/17 0244 07/18/17 0230 07/23/17 1011  NA 136  < > 131*  < > 133* 131* 134  K 4.2  < > 4.1  < > 4.4 4.0 4.2  CL 103  < > 100*  < > 96* 97* 97  CO2 19*  < > 23  < > 24 26 23   GLUCOSE 104*  < > 113*  < > 94 71 84  BUN 34*  < > 29*  < > 25* 22* 23  CREATININE 1.23*  < > 1.70*  < > 1.33* 1.20* 1.14*  CALCIUM 9.0  < > 8.6*  < > 8.7* 8.7* 9.2  MG  --   --  1.8  --  1.7  --   --   TSH 5.35*  --   --   --   --   --   --   < > = values in this interval not displayed. Liver Function Tests:  Recent Labs  09/19/16 1215 05/01/17 1129  AST 23 17  ALT 12 11  ALKPHOS 84 87  BILITOT 0.5 0.6  PROT 7.3 6.8  ALBUMIN 3.9 3.6    Recent Labs  05/01/17 1119  LIPASE 48  AMYLASE 46   No results for input(s): AMMONIA in the last 8760 hours. CBC:  Recent Labs  05/03/17 0808 06/27/17 1208 07/09/17 1321  WBC 5.2 6.4 9.8  NEUTROABS 3.7 4,480 6.8  HGB 10.8* 12.0 11.4*  13.9  HCT 33.7* 36.3 34.2*  41.0  MCV 91.6 88.3 85.9  PLT 238 251 242   Lipid  Panel: No results for input(s): CHOL, HDL, LDLCALC, TRIG, CHOLHDL, LDLDIRECT in the last 8760 hours. TSH:  Recent Labs  06/27/17 1208  TSH 5.35*   A1C: Lab Results  Component Value Date   HGBA1C 6.2 (H) 05/02/2016     Assessment/Plan 1. Persistent atrial  fibrillation (HCC) SR after cardioversion during hospitalization. conts on eliquis 2.5 mg BID for anticoagulation. Rate improved with decrease in metoprolol to 50 mg BID  2. Essential hypertension Blood pressure is stable on current regimen. Will cont current medications.   3. Late onset Alzheimer's disease with behavioral disturbance Progressive dementia, worsening of confusion while in hospital but has improved since then. conts on aricept and tolerating medication at this time. Now has medication aids to help with medication in the morning and evening.  Also will ensure she is getting breakfast. Pt going to meals at facility for lunch and dinner. Family very involved.   4. Chronic diastolic heart failure (HCC) Volume status remains stable, O2 sats adequate on RA. Not currently on diuretics. BMP stable.   5. CKD stage 3 BUN/Cr stable on labs from cardiology. Encouraged proper hydration.   Next appt: 3 months with Dr Mariea Clonts as scheduled.  Carlos American. Harle Battiest  [facility] [phone] 8 am - 5 pm) [phone] (after hours)

## 2017-07-25 NOTE — Patient Instructions (Signed)
Keep follow up as scheduled.

## 2017-07-29 DIAGNOSIS — I5032 Chronic diastolic (congestive) heart failure: Secondary | ICD-10-CM | POA: Insufficient documentation

## 2017-07-30 NOTE — Addendum Note (Signed)
Addended by: Sherrie Mustache K on: 07/30/2017 10:00 AM   Modules accepted: Level of Service

## 2017-08-12 DIAGNOSIS — H353213 Exudative age-related macular degeneration, right eye, with inactive scar: Secondary | ICD-10-CM | POA: Diagnosis not present

## 2017-08-12 DIAGNOSIS — H353222 Exudative age-related macular degeneration, left eye, with inactive choroidal neovascularization: Secondary | ICD-10-CM | POA: Diagnosis not present

## 2017-08-12 DIAGNOSIS — H43813 Vitreous degeneration, bilateral: Secondary | ICD-10-CM | POA: Diagnosis not present

## 2017-09-02 DIAGNOSIS — M7542 Impingement syndrome of left shoulder: Secondary | ICD-10-CM | POA: Diagnosis not present

## 2017-09-02 DIAGNOSIS — M25512 Pain in left shoulder: Secondary | ICD-10-CM | POA: Diagnosis not present

## 2017-09-02 DIAGNOSIS — G8929 Other chronic pain: Secondary | ICD-10-CM | POA: Diagnosis not present

## 2017-09-17 ENCOUNTER — Encounter: Payer: Self-pay | Admitting: Physician Assistant

## 2017-09-17 ENCOUNTER — Ambulatory Visit (INDEPENDENT_AMBULATORY_CARE_PROVIDER_SITE_OTHER): Payer: Medicare Other | Admitting: Physician Assistant

## 2017-09-17 VITALS — BP 140/62 | HR 59 | Ht 62.0 in | Wt 130.2 lb

## 2017-09-17 DIAGNOSIS — L97522 Non-pressure chronic ulcer of other part of left foot with fat layer exposed: Secondary | ICD-10-CM | POA: Diagnosis not present

## 2017-09-17 DIAGNOSIS — I5032 Chronic diastolic (congestive) heart failure: Secondary | ICD-10-CM | POA: Diagnosis not present

## 2017-09-17 DIAGNOSIS — I481 Persistent atrial fibrillation: Secondary | ICD-10-CM | POA: Diagnosis not present

## 2017-09-17 DIAGNOSIS — I10 Essential (primary) hypertension: Secondary | ICD-10-CM | POA: Diagnosis not present

## 2017-09-17 DIAGNOSIS — Z7901 Long term (current) use of anticoagulants: Secondary | ICD-10-CM

## 2017-09-17 DIAGNOSIS — I4819 Other persistent atrial fibrillation: Secondary | ICD-10-CM

## 2017-09-17 NOTE — Progress Notes (Signed)
Cardiology Office Note   Date:  09/17/2017   ID:  Brittney Gutierrez, DOB 02-18-1927, MRN [id number]  PCP:  Gayland Curry, DO  Cardiologist:  Dr. Sallyanne Kuster, in hospital 07/18/2017  Rosaria Ferries, PA-C   Chief Complaint  Patient presents with  . F/u visit    pt c/o chest heaviness, SOB on exertion, and occasional tingling in legs/feet when sitting    History of Present Illness: Brittney Gutierrez is a 83 y.o. female with a history of tachycardia-bradycardia syndrome, symptomatic persistent atrial fibrillation with difficult ventricular rate control, intol amio (itching), bradycardia w/ BB/CCB, on Eliquis-CHADS VASc=6 (age x 2, female, TIA x 2, vascular dz), low-risk MV 2016, EF 50-55% w/ mod LVH by echo 02/2016  Admitted 08/01-08/10 with near-syncope, s/p TEE/DCCV>>SR, low BP>>IVF>>CHF>>IV Lasix, wt 126 lbs at d/c, now on O2, HHPT, beta blocker increased and losartan decreased  Brittney Gutierrez presents for cardiology follow up. Her daughter is with her.   She lives at [facility], independent living.   She feels she is doing pretty well. She feels she is in SR. When in afib, she gets flushed and light-headed.  She does not weigh herself. She does not notice LE edema. No orthopnea or PND. She cannot see well enough to use home scales, but there are some scales in the exercise room that she feels she could.  She wonders if she can exercise. She was walking but quit because of the atrial fib. She also has back issues.   She went to the grocery store with her daughter and was fatigued when she left. She did not get CP w/ exertion, but was SOB. She feels her stamina is less than what it used to be.    Past Medical History:  Diagnosis Date  . Abnormal weight loss 05/28/2004  . Anxiety 01/17/2003  . Back pain 08/19/2005   with radiculopathy  . Bradycardia, drug induced    Diltiazem  . Cerebral atherosclerosis 09/24/1999  . Chest pain, atypical 04/2016   minor CAD at cath     . Chronic anticoagulation    Eliquis  . Dementia    "don't know kind or stage" (05/02/2016)  . Depression 09/06/2002  . Diverticulitis 09/28/2007  . External hemorrhoids 04/12/2009  . Female climacteric state 03/04/2000  . 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 GERD (gastroesophageal reflux disease)   . Hypertensive cardiovascular disease    moderate LVH on echo march 2017  . Macular degeneration   . Malaise and fatigue   . 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 Osteoarthrosis, localized   . Osteoporosis 05/28/2004  . Paranoia ([place])   . Paroxysmal A-fib ([place])   . TIA (transient ischemic attack) 2000s?  [name] Urinary incontinence 08/18/2007  . UTI (urinary tract infection)   . Vaginitis, atrophic   . Vertigo, peripheral 10/07/2000    Past Surgical History:  Procedure Laterality Date  . BRAIN SURGERY  2000   Neurocysticercosis ("parasite")  . CARDIAC CATHETERIZATION N/A 05/03/2016   Procedure: Left Heart Cath and Coronary Angiography;  Surgeon: Sherren Mocha, MD;  Location: [place] CV LAB;  Service: Cardiovascular;  Laterality: N/A;  . CARDIOVERSION N/A 07/14/2017   Procedure: CARDIOVERSION;  Surgeon: Pixie Casino, MD;  Location: [facility] ENDOSCOPY;  Service: Cardiovascular;  Laterality: N/A;  . CATARACT EXTRACTION  2013  . CATARACT EXTRACTION, BILATERAL     "not sure if both eyes; feel like  it probably was"  . JOINT REPLACEMENT    . PERIPHERAL VASCULAR CATHETERIZATION N/A 05/03/2016   Procedure: Abdominal Aortogram;  Surgeon: Sherren Mocha, MD;  Location: [place] CV LAB;  Service: Cardiovascular;  Laterality: N/A;  . SHOULDER ARTHROSCOPY W/ ROTATOR CUFF REPAIR Right X 3  . TEE WITHOUT CARDIOVERSION N/A 07/14/2017   Procedure: TRANSESOPHAGEAL ECHOCARDIOGRAM (TEE);  Surgeon: Pixie Casino, MD;  Location: [place];  Service: Cardiovascular;  Laterality: N/A;  . TOTAL KNEE ARTHROPLASTY Left 1990  . VAGINAL  HYSTERECTOMY  1960    Current Outpatient Prescriptions  Medication Sig Dispense Refill  . acetaminophen (TYLENOL) 325 MG tablet Take 650 mg by mouth every 6 (six) hours as needed for mild pain.    [name] amLODipine (NORVASC) 5 MG tablet Take 1 tablet (5 mg total) by mouth daily. 90 tablet 3  . apixaban (ELIQUIS) 2.5 MG TABS tablet Take 1 tablet (2.5 mg total) by mouth 2 (two) times daily. 180 tablet 3  . Cholecalciferol (VITAMIN D3) 2000 units TABS Take 2,000 Units by mouth daily.     [name] docusate sodium (COLACE) 100 MG capsule Take 100 mg by mouth 2 (two) times daily.     [name] donepezil (ARICEPT) 10 MG tablet Take 1 tablet (10 mg total) by mouth daily. 30 tablet 0  . losartan (COZAAR) 25 MG tablet Take 1 tablet (25 mg total) by mouth daily. 90 tablet 3  . metoprolol tartrate (LOPRESSOR) 50 MG tablet Take 1 tablet (50 mg total) by mouth 2 (two) times daily. 180 tablet 3  . venlafaxine (EFFEXOR) 37.5 MG tablet TAKE 1 TABLET TWICE DAILY 180 tablet 3   No current facility-administered medications for this visit.     Allergies:   Bactrim [sulfamethoxazole-trimethoprim]; Amiodarone; Amoxicillin; Demerol [meperidine]; Hydralazine; Lisinopril; Zoloft [sertraline hcl]; and Tape    Social History:  The patient  reports that she has never smoked. She has never used smokeless tobacco. She reports that she does not drink alcohol or use drugs.   Family History:  The patient's family history includes Heart attack in her father; Hypertension in her sister; Polycystic kidney disease in her mother.    ROS:  Please see the history of present illness. All other systems are reviewed and negative.    PHYSICAL EXAM: VS:  BP 140/62 (BP Location: Left Arm, Patient Position: Sitting, Cuff Size: Normal)   Pulse (!) 59   Ht 5\' 2"  (1.575 m)   Wt 130 lb 3.2 oz (59.1 kg)   BMI 23.81 kg/m  , BMI Body mass index is 23.81 kg/m. GEN: Well nourished, well developed, female in no acute distress  HEENT: normal for age    Neck: minimal JVD, no carotid bruit, no masses Cardiac: Irreg R&R; soft murmur, no rubs, or gallops Respiratory: Decreased breath sounds bases bilaterally, normal work of breathing GI: soft, nontender, nondistended, + BS MS: no deformity or atrophy; trace pedal edema; distal pulses are 2+ in all 4 extremities   Skin: warm and dry, no rash Neuro:  Strength and sensation are intact Psych: euthymic mood, full affect   EKG:  EKG is ordered today. The ekg ordered today demonstrates sinus rhythm, frequent PVCs in a trigeminy pattern. Possible U wave  ECHO: 07/11/2017 - Left ventricle: The cavity size was normal. Wall thickness was   increased in a pattern of moderate LVH. Systolic function was   normal. The estimated ejection fraction was in the range of 50%   to 55%. Wall motion was normal; there were  no regional wall   motion abnormalities. The study is not technically sufficient to   allow evaluation of LV diastolic function. - Aortic valve: There was mild regurgitation. - Mitral valve: Calcified annulus. There was mild regurgitation. - Left atrium: The atrium was moderately dilated. - Pulmonary arteries: Systolic pressure was mildly to moderately   increased. PA peak pressure: 48 mm Hg (S). Impressions: - Normal LV systolic function; moderate LVH; sclerotic aortic valve   with mild AI; mild MR; moderate LAE; mild TR with mild to   moderate elevation in pulmonary pressure.  Recent Labs: 11/27/2016: B Natriuretic Peptide 304.2 05/01/2017: ALT 11 06/27/2017: TSH 5.35 07/09/2017: Hemoglobin 13.9; Hemoglobin 11.4; Platelets 242 07/17/2017: Magnesium 1.7 07/23/2017: BUN 23; Creatinine, Ser 1.14; Potassium 4.2; Sodium 134    Lipid Panel    Component Value Date/Time   CHOL 195 05/02/2016 0732   TRIG 49 05/02/2016 0732   HDL 65 05/02/2016 0732   CHOLHDL 3.0 05/02/2016 0732   VLDL 10 05/02/2016 0732   LDLCALC 120 (H) 05/02/2016 0732     Wt Readings from Last 3 Encounters:   09/17/17 130 lb 3.2 oz (59.1 kg)  07/25/17 127 lb 9.6 oz (57.9 kg)  07/23/17 121 lb 1 oz (54.9 kg)     Other studies Reviewed: Additional studies/ records that were reviewed today include: Office notes, hospital records and testing.  ASSESSMENT AND PLAN:  1. Persistent AF: She is maintaining sinus rhythm. Her heart rate is slightly irregular on exam because of the PACs. Continue beta blocker at the current dose. She understands that she needs to call somebody if she gets symptoms similar to her previous ones from atrial fibrillation.  2. Chronic anticoagulation: CHADS2VASC=6, no bleeding issues on the Eliquis. Continue this  3. D-CHF: Her echo in August was not able to evaluate for diastolic dysfunction. She got volume overloaded during her hospitalization due to large amounts of IV fluids administered for hypotension. She is encouraged to keep an eye on her way and let us know if she has any problems with increasing dyspnea on exertion or lower extremity edema. No signs or symptoms of volume overload at this time. She is no longer on oxygen.  4. Hypertension: Her blood pressures a little both goal now, but she had just walked. She does not believe her blood pressure runs high normally. She is encouraged to check this and let us know if she is running higher than today.   Current medicines are reviewed at length with the patient today.  The patient does not have concerns regarding medicines.  The following changes have been made:  no change  Labs/ tests ordered today include:  No orders of the defined types were placed in this encounter.    Disposition:   FU with Dr. Sallyanne Kuster  Signed, Rosaria Ferries, PA-C  09/17/2017 10:41 AM    [place] Phone: [phone]; Fax: [phone]  This note was written with the assistance of speech recognition software. Please excuse any transcriptional errors.

## 2017-09-17 NOTE — Progress Notes (Signed)
Thanks MCr 

## 2017-09-17 NOTE — Patient Instructions (Signed)
Medication Instructions:  NO CHANGES If you need a refill on your cardiac medications before your next appointment, please call your pharmacy.  Follow-Up: Your physician wants you to follow-up in: [place].   Special Instructions: OK TO INCREASE ACTIVITY AS TOLERATED  WEIGH YOURSELF AT LEAST 3 TIMES A WEEK  MAY DO CARDIO EXERCISES 3 TIMES A WEEK INCREASE SLOWLY TO 30 AT A TIME  CALL IF HEART RATE <55 BEATS PER MIN OR YOU GET LIGHTHEADED  CHECK BP OCCASIONALLY  CALL IF YOU GET EXERTIONAL SHORTNESS OF BREATH -OR- FOR INCREASED LOWER EXTREMITY SWELLING   Thank you for choosing [facility] at [employer]!!

## 2017-09-18 ENCOUNTER — Ambulatory Visit: Payer: Medicare Other

## 2017-09-19 ENCOUNTER — Ambulatory Visit: Payer: Self-pay

## 2017-09-25 ENCOUNTER — Encounter: Payer: Self-pay | Admitting: Nurse Practitioner

## 2017-09-25 ENCOUNTER — Ambulatory Visit (INDEPENDENT_AMBULATORY_CARE_PROVIDER_SITE_OTHER): Payer: Medicare Other | Admitting: Nurse Practitioner

## 2017-09-25 VITALS — BP 160/82 | HR 64 | Temp 97.5°F | Resp 18 | Ht 62.0 in | Wt 130.4 lb

## 2017-09-25 DIAGNOSIS — R079 Chest pain, unspecified: Secondary | ICD-10-CM | POA: Diagnosis not present

## 2017-09-25 DIAGNOSIS — R410 Disorientation, unspecified: Secondary | ICD-10-CM

## 2017-09-25 DIAGNOSIS — R0602 Shortness of breath: Secondary | ICD-10-CM

## 2017-09-25 LAB — POCT URINALYSIS DIPSTICK
BILIRUBIN UA: NEGATIVE
Glucose, UA: NEGATIVE
Ketones, UA: NEGATIVE
Leukocytes, UA: NEGATIVE
NITRITE UA: NEGATIVE
PROTEIN UA: NEGATIVE
RBC UA: NEGATIVE
SPEC GRAV UA: 1.01 (ref 1.010–1.025)
UROBILINOGEN UA: 0.2 U/dL
pH, UA: 7.5 (ref 5.0–8.0)

## 2017-09-25 NOTE — Progress Notes (Signed)
Careteam: Patient Care Team: Gayland Curry, DO as PCP - General (Geriatric Medicine) Sanda Klein, MD as Consulting Physician (Cardiology) Sherlynn Stalls, MD as Consulting Physician (Ophthalmology) Murriel Hopper, MD as Referring Physician (Student)  Advanced Directive information Does Patient Have a Medical Advance Directive?: No  Allergies  Allergen Reactions  . Bactrim [Sulfamethoxazole-Trimethoprim] Nausea Only  . Amiodarone Itching  . Amoxicillin Other (See Comments)    Unknown- ? Upset stomach  . Demerol [Meperidine] Other (See Comments)    Hallucinations  . Hydralazine Other (See Comments)    Tingling and chest pain   . Lisinopril Cough  . Zoloft [Sertraline Hcl] Other (See Comments)    Unknown  . Tape Rash and Other (See Comments)    Can tear the skin, also    Chief Complaint  Patient presents with  . Acute Visit    Pt is being seen for confusion, anxiety, some SOB with activity that began after starting cephalexin on 09/17/17.    . Other    Daughter in room     HPI: Patient is a 83 y.o. female seen in the office today due to being confused.  Daughter gives history. Pt with hx of dementia with behaviors, afib, tachy-brady syndrome, weight loss, CAD, GERD, macular degeneration and others When she woke up this morning she had increase confusion which daughter says has been building. Last week went to podiatrist and found a bunion and pus noted to it so was started on antibiotic. Over the weekend became confused.  Went to the cardiologist last week and everything was fine.  More short of breath today. States she is going to faint.  New cough, clears throat a lot but that is not new.  States she has been having chest pains that have been on and off for a week.   Review of Systems:  Review of Systems  Constitutional: Negative for chills, fever and weight loss.  HENT: Negative for tinnitus.   Respiratory: Positive for cough and shortness of breath.  Negative for sputum production and wheezing.   Cardiovascular: Positive for chest pain. Negative for palpitations and leg swelling.  Gastrointestinal: Positive for diarrhea (today). Negative for abdominal pain, constipation and heartburn.  Genitourinary: Negative for dysuria, frequency and urgency.  Skin: Negative.   Neurological: Positive for dizziness and weakness. Negative for headaches.  Psychiatric/Behavioral: Positive for memory loss.    Past Medical History:  Diagnosis Date  . Abnormal weight loss 05/28/2004  . Anxiety 01/17/2003  . Back pain 08/19/2005   with radiculopathy  . Bradycardia, drug induced    Diltiazem  . Cerebral atherosclerosis 09/24/1999  . Chest pain, atypical 04/2016   minor CAD at cath   . Chronic anticoagulation    Eliquis  . Dementia    "don't know kind or stage" (05/02/2016)  . Depression 09/06/2002  . Diverticulitis 09/28/2007  . External hemorrhoids 04/12/2009  . Female climacteric state 03/04/2000  . 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 GERD (gastroesophageal reflux disease)   . Hypertensive cardiovascular disease    moderate LVH on echo march 2017  . Macular degeneration   . Malaise and fatigue   . 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 Osteoarthrosis, localized   . Osteoporosis 05/28/2004  . Paranoia ([place])   . Paroxysmal A-fib ([place])   . TIA (transient ischemic attack) 2000s?  [name] Urinary incontinence 08/18/2007  . UTI (urinary tract infection)   .  Vaginitis, atrophic   . Vertigo, peripheral 10/07/2000   Past Surgical History:  Procedure Laterality Date  . BRAIN SURGERY  2000   Neurocysticercosis ("parasite")  . CARDIAC CATHETERIZATION N/A 05/03/2016   Procedure: Left Heart Cath and Coronary Angiography;  Surgeon: Sherren Mocha, MD;  Location: [place] [facility];  Service: Cardiovascular;  Laterality: N/A;  . CARDIOVERSION N/A 07/14/2017   Procedure: CARDIOVERSION;   Surgeon: Pixie Casino, MD;  Location: [facility] ENDOSCOPY;  Service: Cardiovascular;  Laterality: N/A;  . CATARACT EXTRACTION  2013  . CATARACT EXTRACTION, BILATERAL     "not sure if both eyes; feel like it probably was"  . JOINT REPLACEMENT    . PERIPHERAL VASCULAR CATHETERIZATION N/A 05/03/2016   Procedure: Abdominal Aortogram;  Surgeon: Sherren Mocha, MD;  Location: [place] CV LAB;  Service: Cardiovascular;  Laterality: N/A;  . SHOULDER ARTHROSCOPY W/ ROTATOR CUFF REPAIR Right X 3  . TEE WITHOUT CARDIOVERSION N/A 07/14/2017   Procedure: TRANSESOPHAGEAL ECHOCARDIOGRAM (TEE);  Surgeon: Pixie Casino, MD;  Location: [place];  Service: Cardiovascular;  Laterality: N/A;  . TOTAL KNEE ARTHROPLASTY Left 1990  . VAGINAL HYSTERECTOMY  1960   Social History:   reports that she has never smoked. She has never used smokeless tobacco. She reports that she does not drink alcohol or use drugs.  Family History  Problem Relation Age of Onset  . Polycystic kidney disease Mother   . Heart attack Father   . Hypertension Sister   . Dementia Neg Hx     Medications: Patient's Medications  New Prescriptions   No medications on file  Previous Medications   ACETAMINOPHEN (TYLENOL) 325 MG TABLET    Take 650 mg by mouth every 6 (six) hours as needed for mild pain.   AMLODIPINE (NORVASC) 5 MG TABLET    Take 1 tablet (5 mg total) by mouth daily.   APIXABAN (ELIQUIS) 2.5 MG TABS TABLET    Take 1 tablet (2.5 mg total) by mouth 2 (two) times daily.   CHOLECALCIFEROL (VITAMIN D3) 2000 UNITS TABS    Take 2,000 Units by mouth daily.    DOCUSATE SODIUM (COLACE) 100 MG CAPSULE    Take 100 mg by mouth 2 (two) times daily.    DONEPEZIL (ARICEPT) 10 MG TABLET    Take 1 tablet (10 mg total) by mouth daily.   LOSARTAN (COZAAR) 25 MG TABLET    Take 1 tablet (25 mg total) by mouth daily.   METOPROLOL TARTRATE (LOPRESSOR) 50 MG TABLET    Take 1 tablet (50 mg total) by mouth 2 (two) times daily.   VENLAFAXINE  (EFFEXOR) 37.5 MG TABLET    TAKE 1 TABLET TWICE DAILY  Modified Medications   No medications on file  Discontinued Medications   No medications on file     Physical Exam:  Vitals:   09/25/17 1551  BP: (!) 160/82  Pulse: 64  Resp: 18  Temp: (!) 97.5 F (36.4 C)  TempSrc: Oral  SpO2: 95%  Weight: 130 lb 6.4 oz (59.1 kg)  Height: '5\' 2"'  (1.575 m)   Body mass index is 23.85 kg/m.  Physical Exam  Constitutional: She appears well-developed and well-nourished. No distress.  HENT:  Mouth/Throat: Oropharynx is clear and moist.  Cardiovascular: Regular rhythm and normal heart sounds.   Bradycardia   Pulmonary/Chest: Effort normal and breath sounds normal. No respiratory distress.  Abdominal: Soft. Bowel sounds are normal. She exhibits no distension. There is no tenderness.  Musculoskeletal: Normal range of motion.  She exhibits no tenderness.  Neurological: She is alert.  oriented to self only  Skin: Skin is warm and dry. No erythema.  Callus on first metatarsal head, pink, non-tender, no drainage.   Psychiatric: She has a normal mood and affect.    Labs reviewed: Basic Metabolic Panel:  Recent Labs  06/27/17 1208  07/11/17 0233  07/17/17 0244 07/18/17 0230 07/23/17 1011  NA 136  < > 131*  < > 133* 131* 134  K 4.2  < > 4.1  < > 4.4 4.0 4.2  CL 103  < > 100*  < > 96* 97* 97  CO2 19*  < > 23  < > '24 26 23  ' GLUCOSE 104*  < > 113*  < > 94 71 84  BUN 34*  < > 29*  < > 25* 22* 23  CREATININE 1.23*  < > 1.70*  < > 1.33* 1.20* 1.14*  CALCIUM 9.0  < > 8.6*  < > 8.7* 8.7* 9.2  MG  --   --  1.8  --  1.7  --   --   TSH 5.35*  --   --   --   --   --   --   < > = values in this interval not displayed. Liver Function Tests:  Recent Labs  05/01/17 1129  AST 17  ALT 11  ALKPHOS 87  BILITOT 0.6  PROT 6.8  ALBUMIN 3.6    Recent Labs  05/01/17 1119  LIPASE 48  AMYLASE 46   No results for input(s): AMMONIA in the last 8760 hours. CBC:  Recent Labs  05/03/17 0808  06/27/17 1208 07/09/17 1321  WBC 5.2 6.4 9.8  NEUTROABS 3.7 4,480 6.8  HGB 10.8* 12.0 11.4*  13.9  HCT 33.7* 36.3 34.2*  41.0  MCV 91.6 88.3 85.9  PLT 238 251 242   Lipid Panel: No results for input(s): CHOL, HDL, LDLCALC, TRIG, CHOLHDL, LDLDIRECT in the last 8760 hours. TSH:  Recent Labs  06/27/17 1208  TSH 5.35*   A1C: Lab Results  Component Value Date   HGBA1C 6.2 (H) 05/02/2016     Assessment/Plan 1. Confusion -worsening since she has been on keflex for wound on foot. Will get chest xray, UA, and labs to evaluate. Foot infection has improved. Possible reaction to keflex or infection.  - CBC with Differential/Platelets - BMP with eGFR - DG Chest 2 View -UA normal   2. Chest pain, unspecified type -saw cardiology last week and everything was stable, has been having chest pains since. Unable to give a lot of details due to dementia and confusion  EKG- SR with PAC, PVC - Troponin I  3. Shortness of breath Pt with mild nonproductive cough, O2 sats drop to 90% with O2 and lungs clear but visibly short of breath and increase work of breathing with ambulation when she walked in but when she was walking out there was no shortness of breath noted.  - DG Chest 2 View  -instructed to go to the ED if symptoms worsen.  Carlos American. Harle Battiest  [facility] [phone] 8 am - 5 pm) [phone] (after hours)

## 2017-09-26 ENCOUNTER — Ambulatory Visit
Admission: RE | Admit: 2017-09-26 | Discharge: 2017-09-26 | Disposition: A | Discharge status: Home / Self Care | Payer: Medicare Other | Source: Ambulatory Visit | Attending: Nurse Practitioner | Admitting: Nurse Practitioner

## 2017-09-26 ENCOUNTER — Other Ambulatory Visit: Payer: Self-pay | Admitting: Nurse Practitioner

## 2017-09-26 DIAGNOSIS — J90 Pleural effusion, not elsewhere classified: Secondary | ICD-10-CM | POA: Diagnosis not present

## 2017-09-26 LAB — CBC WITH DIFFERENTIAL/PLATELET
Basophils Absolute: 60 cells/uL (ref 0–200)
Basophils Relative: 1 %
EOS PCT: 2.2 %
Eosinophils Absolute: 132 cells/uL (ref 15–500)
HCT: 32.6 % — ABNORMAL LOW (ref 35.0–45.0)
Hemoglobin: 10.8 g/dL — ABNORMAL LOW (ref 11.7–15.5)
Lymphs Abs: 714 cells/uL — ABNORMAL LOW (ref 850–3900)
MCH: 28.1 pg (ref 27.0–33.0)
MCHC: 33.1 g/dL (ref 32.0–36.0)
MCV: 84.9 fL (ref 80.0–100.0)
MPV: 11.1 fL (ref 7.5–12.5)
Monocytes Relative: 9.5 %
NEUTROS PCT: 75.4 %
Neutro Abs: 4524 cells/uL (ref 1500–7800)
PLATELETS: 268 10*3/uL (ref 140–400)
RBC: 3.84 10*6/uL (ref 3.80–5.10)
RDW: 15.9 % — AB (ref 11.0–15.0)
TOTAL LYMPHOCYTE: 11.9 %
WBC mixed population: 570 cells/uL (ref 200–950)
WBC: 6 10*3/uL (ref 3.8–10.8)

## 2017-09-26 LAB — BASIC METABOLIC PANEL WITH GFR
BUN/Creatinine Ratio: 21 (calc) (ref 6–22)
BUN: 23 mg/dL (ref 7–25)
CALCIUM: 9.3 mg/dL (ref 8.6–10.4)
CHLORIDE: 103 mmol/L (ref 98–110)
CO2: 21 mmol/L (ref 20–32)
Creat: 1.11 mg/dL — ABNORMAL HIGH (ref 0.60–0.88)
GFR, EST AFRICAN AMERICAN: 52 mL/min/{1.73_m2} — AB (ref >=60)
GFR, Est Non African American: 45 mL/min/{1.73_m2} — ABNORMAL LOW (ref >=60)
Glucose, Bld: 112 mg/dL (ref 65–139)
POTASSIUM: 4.9 mmol/L (ref 3.5–5.3)
SODIUM: 134 mmol/L — AB (ref 135–146)

## 2017-09-26 LAB — TROPONIN I: TROPONIN I: 0.02 ng/mL (ref <=0.0)

## 2017-09-26 MED ORDER — FUROSEMIDE 20 MG PO TABS
20.0000 mg | ORAL_TABLET | Freq: Every day | ORAL | 0 refills | Status: DC
Start: 2017-09-26 — End: 2017-11-20

## 2017-09-29 ENCOUNTER — Ambulatory Visit (INDEPENDENT_AMBULATORY_CARE_PROVIDER_SITE_OTHER): Payer: Medicare Other | Admitting: Nurse Practitioner

## 2017-09-29 ENCOUNTER — Encounter: Payer: Self-pay | Admitting: Nurse Practitioner

## 2017-09-29 VITALS — BP 148/68 | HR 59 | Temp 97.5°F | Resp 17 | Ht 62.0 in | Wt 125.8 lb

## 2017-09-29 DIAGNOSIS — J90 Pleural effusion, not elsewhere classified: Secondary | ICD-10-CM | POA: Diagnosis not present

## 2017-09-29 DIAGNOSIS — Z23 Encounter for immunization: Secondary | ICD-10-CM

## 2017-09-29 DIAGNOSIS — J302 Other seasonal allergic rhinitis: Secondary | ICD-10-CM | POA: Diagnosis not present

## 2017-09-29 DIAGNOSIS — D500 Iron deficiency anemia secondary to blood loss (chronic): Secondary | ICD-10-CM | POA: Diagnosis not present

## 2017-09-29 DIAGNOSIS — R195 Other fecal abnormalities: Secondary | ICD-10-CM | POA: Diagnosis not present

## 2017-09-29 NOTE — Patient Instructions (Addendum)
You test today was positive for blood in your stool. A referral to Gastrology has been placed and you will received phone call to schedule an appointment.  Follow up in 1 week for lab   May use generic Zyrtec or Claritin for allergy symptoms. Take daily.

## 2017-09-29 NOTE — Progress Notes (Signed)
Careteam: Patient Care Team: Gayland Curry, DO as PCP - General (Geriatric Medicine) Sanda Klein, MD as Consulting Physician (Cardiology) Sherlynn Stalls, MD as Consulting Physician (Ophthalmology) Murriel Hopper, MD as Referring Physician (Student)  Advanced Directive information Does Patient Have a Medical Advance Directive?: Yes, Type of Advance Directive: [place];Living will  Allergies  Allergen Reactions  . Bactrim [Sulfamethoxazole-Trimethoprim] Nausea Only  . Amiodarone Itching  . Amoxicillin Other (See Comments)    Unknown- ? Upset stomach  . Demerol [Meperidine] Other (See Comments)    Hallucinations  . Hydralazine Other (See Comments)    Tingling and chest pain   . Lisinopril Cough  . Zoloft [Sertraline Hcl] Other (See Comments)    Unknown  . Tape Rash and Other (See Comments)    Can tear the skin, also    Chief Complaint  Patient presents with  . Follow-up    Pt is being seen to follow up on SOB. Pt and daughter report that SOB has ceased. They have no complaints today.   . Other    daughter, [name], in room     HPI: Patient is a 83 y.o. female seen in the office today follow of Shortness of breath, chest pain, and confusion. At last visit pt with increase confusion, chest pain and shortness of breath for the last week. Had been prescribed keflex for foot which wound has been improving. Chest xray revealed mild effusions but due to symptoms placed pt on Lasix 20 mg daily. Patient presents with daughter. Patient states she feels better today. She denies shortness of breath, chest pain. Patient's daughter states Stephane's confusion has improved since last visit. Of noted hemoglobin has also been trending down since August were it was 13.9- 10.8 on CBC last week. Pt states some diarrhea occurrence 2 in past 24 hours. She denies black tarry stools or blood in stool.  She complains of burning sensation in eyes and post nasal drip that  started 3 days ago. Feels like this is allergy related.  Review of Systems:  Review of Systems  Constitutional: Negative for chills, fever and weight loss.  HENT: Negative for congestion, ear pain, sinus pain and sore throat.   Eyes: Negative for blurred vision, pain and discharge.  Respiratory: Negative for cough, sputum production, shortness of breath and wheezing.   Cardiovascular: Negative for chest pain, palpitations and leg swelling.  Gastrointestinal: Negative for diarrhea, nausea and vomiting.  Genitourinary: Negative for dysuria and frequency.  Neurological: Negative for dizziness and headaches.  Psychiatric/Behavioral: Negative for depression.      Past Medical History:  Diagnosis Date  . Abnormal weight loss 05/28/2004  . Anxiety 01/17/2003  . Back pain 08/19/2005   with radiculopathy  . Bradycardia, drug induced    Diltiazem  . Cerebral atherosclerosis 09/24/1999  . Chest pain, atypical 04/2016   minor CAD at cath   . Chronic anticoagulation    Eliquis  . Dementia    "don't know kind or stage" (05/02/2016)  . Depression 09/06/2002  . Diverticulitis 09/28/2007  . External hemorrhoids 04/12/2009  . Female climacteric state 03/04/2000  . 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 GERD (gastroesophageal reflux disease)   . Hypertensive cardiovascular disease    moderate LVH on echo march 2017  . Macular degeneration   . Malaise and fatigue   . 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 Osteoarthrosis, localized   .  Osteoporosis 05/28/2004  . Paranoia ([place])   . Paroxysmal A-fib ([place])   . TIA (transient ischemic attack) 2000s?  [name] Urinary incontinence 08/18/2007  . UTI (urinary tract infection)   . Vaginitis, atrophic   . Vertigo, peripheral 10/07/2000   Past Surgical History:  Procedure Laterality Date  . BRAIN SURGERY  2000   Neurocysticercosis ("parasite")  . CARDIAC CATHETERIZATION N/A 05/03/2016    Procedure: Left Heart Cath and Coronary Angiography;  Surgeon: Sherren Mocha, MD;  Location: [place] CV LAB;  Service: Cardiovascular;  Laterality: N/A;  . CARDIOVERSION N/A 07/14/2017   Procedure: CARDIOVERSION;  Surgeon: Pixie Casino, MD;  Location: [facility] ENDOSCOPY;  Service: Cardiovascular;  Laterality: N/A;  . CATARACT EXTRACTION  2013  . CATARACT EXTRACTION, BILATERAL     "not sure if both eyes; feel like it probably was"  . JOINT REPLACEMENT    . PERIPHERAL VASCULAR CATHETERIZATION N/A 05/03/2016   Procedure: Abdominal Aortogram;  Surgeon: Sherren Mocha, MD;  Location: [place] CV LAB;  Service: Cardiovascular;  Laterality: N/A;  . SHOULDER ARTHROSCOPY W/ ROTATOR CUFF REPAIR Right X 3  . TEE WITHOUT CARDIOVERSION N/A 07/14/2017   Procedure: TRANSESOPHAGEAL ECHOCARDIOGRAM (TEE);  Surgeon: Pixie Casino, MD;  Location: [place];  Service: Cardiovascular;  Laterality: N/A;  . TOTAL KNEE ARTHROPLASTY Left 1990  . VAGINAL HYSTERECTOMY  1960   Social History:   reports that she has never smoked. She has never used smokeless tobacco. She reports that she does not drink alcohol or use drugs.  Family History  Problem Relation Age of Onset  . Polycystic kidney disease Mother   . Heart attack Father   . Hypertension Sister   . Dementia Neg Hx     Medications: Patient's Medications  New Prescriptions   No medications on file  Previous Medications   ACETAMINOPHEN (TYLENOL) 325 MG TABLET    Take 650 mg by mouth every 6 (six) hours as needed for mild pain.   AMLODIPINE (NORVASC) 5 MG TABLET    Take 1 tablet (5 mg total) by mouth daily.   APIXABAN (ELIQUIS) 2.5 MG TABS TABLET    Take 1 tablet (2.5 mg total) by mouth 2 (two) times daily.   CHOLECALCIFEROL (VITAMIN D3) 2000 UNITS TABS    Take 2,000 Units by mouth daily.    DOCUSATE SODIUM (COLACE) 100 MG CAPSULE    Take 100 mg by mouth 2 (two) times daily.    DONEPEZIL (ARICEPT) 10 MG TABLET    Take 1 tablet (10 mg total) by mouth  daily.   FUROSEMIDE (LASIX) 20 MG TABLET    Take 1 tablet (20 mg total) by mouth daily.   LOSARTAN (COZAAR) 25 MG TABLET    Take 1 tablet (25 mg total) by mouth daily.   METOPROLOL TARTRATE (LOPRESSOR) 50 MG TABLET    Take 1 tablet (50 mg total) by mouth 2 (two) times daily.   VENLAFAXINE (EFFEXOR) 37.5 MG TABLET    TAKE 1 TABLET TWICE DAILY  Modified Medications   No medications on file  Discontinued Medications   No medications on file     Physical Exam:  Vitals:   09/29/17 1336  BP: (!) 148/68  Pulse: (!) 59  Resp: 17  Temp: (!) 97.5 F (36.4 C)  TempSrc: Oral  SpO2: 95%  Weight: 125 lb 12.8 oz (57.1 kg)  Height: 5\' 2"  (1.575 m)   Body mass index is 23.01 kg/m.  Physical Exam  Constitutional: She appears well-developed and well-nourished. No  distress.  HENT:  Head: Normocephalic and atraumatic.  Nose: Nose normal.  Mouth/Throat: Oropharynx is clear and moist. No oropharyngeal exudate.  Eyes: Pupils are equal, round, and reactive to light. Conjunctivae and EOM are normal. Right eye exhibits no discharge. Left eye exhibits no discharge. No scleral icterus.  Neck: Normal range of motion. Neck supple. No tracheal deviation present.  Cardiovascular: Normal rate, regular rhythm, normal heart sounds and intact distal pulses.   No murmur heard. Pulmonary/Chest: Effort normal and breath sounds normal. No respiratory distress. She has no wheezes.  Abdominal: Soft. Bowel sounds are normal. There is no tenderness.  Genitourinary: Rectal exam shows guaiac positive stool. Rectal exam shows no external hemorrhoid.  Musculoskeletal: Normal range of motion. She exhibits no edema.  Neurological: She is alert. She is disoriented (Oriented to person and place only).  Skin: Skin is warm and dry. Capillary refill takes 2 to 3 seconds. She is not diaphoretic.  Psychiatric: She has a normal mood and affect.    Labs reviewed: Basic Metabolic Panel:  Recent Labs  06/27/17 1208   07/11/17 0233  07/17/17 0244 07/18/17 0230 07/23/17 1011 09/25/17 1640  NA 136  < > 131*  < > 133* 131* 134 134*  K 4.2  < > 4.1  < > 4.4 4.0 4.2 4.9  CL 103  < > 100*  < > 96* 97* 97 103  CO2 19*  < > 23  < > 24 26 23 21   GLUCOSE 104*  < > 113*  < > 94 71 84 112  BUN 34*  < > 29*  < > 25* 22* 23 23  CREATININE 1.23*  < > 1.70*  < > 1.33* 1.20* 1.14* 1.11*  CALCIUM 9.0  < > 8.6*  < > 8.7* 8.7* 9.2 9.3  MG  --   --  1.8  --  1.7  --   --   --   TSH 5.35*  --   --   --   --   --   --   --   < > = values in this interval not displayed. Liver Function Tests:  Recent Labs  05/01/17 1129  AST 17  ALT 11  ALKPHOS 87  BILITOT 0.6  PROT 6.8  ALBUMIN 3.6    Recent Labs  05/01/17 1119  LIPASE 48  AMYLASE 46   No results for input(s): AMMONIA in the last 8760 hours. CBC:  Recent Labs  06/27/17 1208 07/09/17 1321 09/25/17 1640  WBC 6.4 9.8 6.0  NEUTROABS 4,480 6.8 4,524  HGB 12.0 11.4*  13.9 10.8*  HCT 36.3 34.2*  41.0 32.6*  MCV 88.3 85.9 84.9  PLT 251 242 268   Lipid Panel: No results for input(s): CHOL, HDL, LDLCALC, TRIG, CHOLHDL, LDLDIRECT in the last 8760 hours. TSH:  Recent Labs  06/27/17 1208  TSH 5.35*   A1C: Lab Results  Component Value Date   HGBA1C 6.2 (H) 05/02/2016     Assessment/Plan 1. Hematest positive stools Hemoccult test was positive in office. After hemoglobin trending down. referral to GI specialist  Follow up cbc  2. Blood loss anemia - Referral to GI specialist due to positive hemoccult test.  3. Pleural effusion Mild pleural effusion noted on chest x-ray. Due to shortness of breath Lasix was started. Noted improvement after starting Lasix. Will cont for 1 week then stop to follow up bmp in 1 week. To call if shortness of breath reoccurs.   4. Seasonal Allergies Start  over the counter zyrtec or claritin for sinus congestion and itching eyes.  5. Flu shot given  Next appt: Follow up 1 week for blood work, scheduled appt  with Dr Sharee Holster K. Harle Battiest  [facility] [phone] 8 am - 5 pm) [phone] (after hours)

## 2017-09-30 ENCOUNTER — Encounter: Payer: Self-pay | Admitting: Nurse Practitioner

## 2017-10-02 ENCOUNTER — Ambulatory Visit: Payer: Medicare Other

## 2017-10-02 DIAGNOSIS — M722 Plantar fascial fibromatosis: Secondary | ICD-10-CM | POA: Diagnosis not present

## 2017-10-02 DIAGNOSIS — M71572 Other bursitis, not elsewhere classified, left ankle and foot: Secondary | ICD-10-CM | POA: Diagnosis not present

## 2017-10-06 ENCOUNTER — Other Ambulatory Visit: Payer: Medicare Other

## 2017-10-06 DIAGNOSIS — J90 Pleural effusion, not elsewhere classified: Secondary | ICD-10-CM | POA: Diagnosis not present

## 2017-10-06 DIAGNOSIS — D500 Iron deficiency anemia secondary to blood loss (chronic): Secondary | ICD-10-CM

## 2017-10-06 DIAGNOSIS — N390 Urinary tract infection, site not specified: Secondary | ICD-10-CM

## 2017-10-06 DIAGNOSIS — R195 Other fecal abnormalities: Secondary | ICD-10-CM | POA: Diagnosis not present

## 2017-10-06 LAB — BASIC METABOLIC PANEL WITH GFR
BUN / CREAT RATIO: 18 (calc) (ref 6–22)
BUN: 21 mg/dL (ref 7–25)
CHLORIDE: 99 mmol/L (ref 98–110)
CO2: 26 mmol/L (ref 20–32)
Calcium: 9.4 mg/dL (ref 8.6–10.4)
Creat: 1.19 mg/dL — ABNORMAL HIGH (ref 0.60–0.88)
GFR, EST AFRICAN AMERICAN: 48 mL/min/{1.73_m2} — AB (ref >=60)
GFR, Est Non African American: 41 mL/min/{1.73_m2} — ABNORMAL LOW (ref >=60)
GLUCOSE: 119 mg/dL — AB (ref 65–99)
POTASSIUM: 3.9 mmol/L (ref 3.5–5.3)
SODIUM: 136 mmol/L (ref 135–146)

## 2017-10-06 LAB — CBC WITH DIFFERENTIAL/PLATELET
BASOS PCT: 1.1 %
Basophils Absolute: 62 cells/uL (ref 0–200)
Eosinophils Absolute: 129 cells/uL (ref 15–500)
Eosinophils Relative: 2.3 %
HCT: 34.5 % — ABNORMAL LOW (ref 35.0–45.0)
Hemoglobin: 11.2 g/dL — ABNORMAL LOW (ref 11.7–15.5)
Lymphs Abs: 907 cells/uL (ref 850–3900)
MCH: 27.9 pg (ref 27.0–33.0)
MCHC: 32.5 g/dL (ref 32.0–36.0)
MCV: 85.8 fL (ref 80.0–100.0)
MONOS PCT: 10.6 %
MPV: 10.9 fL (ref 7.5–12.5)
NEUTROS PCT: 69.8 %
Neutro Abs: 3909 cells/uL (ref 1500–7800)
PLATELETS: 271 10*3/uL (ref 140–400)
RBC: 4.02 10*6/uL (ref 3.80–5.10)
RDW: 15.2 % — AB (ref 11.0–15.0)
TOTAL LYMPHOCYTE: 16.2 %
WBC mixed population: 594 cells/uL (ref 200–950)
WBC: 5.6 10*3/uL (ref 3.8–10.8)

## 2017-10-08 LAB — URINE CULTURE
MICRO NUMBER:: 81209002
SPECIMEN QUALITY: ADEQUATE

## 2017-10-09 ENCOUNTER — Encounter: Payer: Self-pay | Admitting: Nurse Practitioner

## 2017-10-09 ENCOUNTER — Ambulatory Visit (INDEPENDENT_AMBULATORY_CARE_PROVIDER_SITE_OTHER): Payer: Medicare Other | Admitting: Nurse Practitioner

## 2017-10-09 ENCOUNTER — Telehealth: Payer: Self-pay

## 2017-10-09 ENCOUNTER — Other Ambulatory Visit (INDEPENDENT_AMBULATORY_CARE_PROVIDER_SITE_OTHER): Payer: Medicare Other

## 2017-10-09 VITALS — BP 110/68 | HR 72 | Ht 62.0 in | Wt 128.5 lb

## 2017-10-09 DIAGNOSIS — D649 Anemia, unspecified: Secondary | ICD-10-CM

## 2017-10-09 DIAGNOSIS — R195 Other fecal abnormalities: Secondary | ICD-10-CM

## 2017-10-09 DIAGNOSIS — Z7901 Long term (current) use of anticoagulants: Secondary | ICD-10-CM

## 2017-10-09 LAB — FERRITIN: FERRITIN: 53.9 ng/mL (ref 10.0–291.0)

## 2017-10-09 MED ORDER — NA SULFATE-K SULFATE-MG SULF 17.5-3.13-1.6 GM/177ML PO SOLN: ORAL | 0 refills | Status: DC

## 2017-10-09 NOTE — Telephone Encounter (Signed)
[facility] [address] [place], [place]  [address] Phone:  [phone]   Fax:  [phone]  10/09/2017   RE:      Brittney Gutierrez DOB:   Sep 18, 1927 MRN:   [id number]   Dear Dr. Orene Desanctis,    We have scheduled the above patient for an endoscopic procedure. Our records show that she is on anticoagulation therapy.   Please advise as to how long the patient may come off her therapy of Eliquis prior to the procedure, which is scheduled for 11/11/17.  Please fax back/ or route the completed form to [place], [place] at [phone].   Sincerely,    Thurmon Fair, RMA

## 2017-10-09 NOTE — Patient Instructions (Signed)
If you are age 81 or older, your body mass index should be between 23-30. Your Body mass index is 23.5 kg/m. If this is out of the aforementioned range listed, please consider follow up with your Primary Care Provider.  If you are age 53 or younger, your body mass index should be between 19-25. Your Body mass index is 23.5 kg/m. If this is out of the aformentioned range listed, please consider follow up with your Primary Care Provider.   You have been scheduled for an endoscopy and colonoscopy. Please follow the written instructions given to you at your visit today. Please pick up your prep supplies at the pharmacy within the next 1-3 days. If you use inhalers (even only as needed), please bring them with you on the day of your procedure. Your physician has requested that you go to www.startemmi.com and enter the access code given to you at your visit today. This web site gives a general overview about your procedure. However, you should still follow specific instructions given to you by our office regarding your preparation for the procedure.  We have sent the following medications to your pharmacy for you to pick up at your convenience: [facility] has requested that you go to the basement for the following lab work before leaving today: Ferritin TIBC  You will be contacted by our office prior to your procedure for directions on holding your Eliquis.  If you do not hear from our office 1 week prior to your scheduled procedure, please call [phone] to discuss.  Thank you for choosing me and [facility].   Tye Savoy, NP

## 2017-10-09 NOTE — Telephone Encounter (Signed)
Hold apixaban 3 days prior to procedure and resume after when ok with GI Kirk Ruths

## 2017-10-09 NOTE — Progress Notes (Signed)
HPI: Patient is an 83 yo female with AFIB, SSS, hx of CHF (EF 50-55%). She is s/p TEE / DCCV early August. Patient is referred by PCP Dr. Mariea Clonts for anemia and occult blood in stool . On chronic anticoagulant for TIA and AFIB. Baseline hg 13.9. Recently developed SOB , hgb found to be 10,8. Also CXR showed some volume overload. Took lasix for a week and SOB resolved unless patient exerts herself. No chest pain. Paitent has macular degeneration, can't see if blood in stool but doesn't thinks so. No bowel changes or abdominal pain. No nausea. Appetite is satisfactory. Weight is stable. Repeat hgb 11.2 on 10/29.  She doesn't take NSAID. No hx of PUD. Patient had a colonoscopy in [place] but she doesn't know when. Daughter is with patient and she doesn't know either. She does give a hx of hermorhoids and diverticulitis. Since moving to [place] two years ago patient hasn't had any GI problems other than occasional constipation for which she takes a stool softener.    Past Medical History:  Diagnosis Date  . Abnormal weight loss 05/28/2004  . Anxiety 01/17/2003  . Back pain 08/19/2005   with radiculopathy  . Bradycardia, drug induced    Diltiazem  . Cerebral atherosclerosis 09/24/1999  . Chest pain, atypical 04/2016   minor CAD at cath   . Chronic anticoagulation    Eliquis  . Dementia    "don't know kind or stage" (05/02/2016)  . Depression 09/06/2002  . Diverticulitis 09/28/2007  . External hemorrhoids 04/12/2009  . Female climacteric state 03/04/2000  . 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 GERD (gastroesophageal reflux disease)   . Hypertensive cardiovascular disease    moderate LVH on echo march 2017  . Macular degeneration   . Malaise and fatigue   . 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 Osteoarthrosis, localized   . Osteoporosis 05/28/2004  . Paranoia ([place])   . Paroxysmal A-fib ([place])   . TIA (transient  ischemic attack) 2000s?  [name] Urinary incontinence 08/18/2007  . UTI (urinary tract infection)   . Vaginitis, atrophic   . Vertigo, peripheral 10/07/2000     Past Surgical History:  Procedure Laterality Date  . BRAIN SURGERY  2000   Neurocysticercosis ("parasite")  . CARDIAC CATHETERIZATION N/A 05/03/2016   Procedure: Left Heart Cath and Coronary Angiography;  Surgeon: Sherren Mocha, MD;  Location: [place] CV LAB;  Service: Cardiovascular;  Laterality: N/A;  . CARDIOVERSION N/A 07/14/2017   Procedure: CARDIOVERSION;  Surgeon: Pixie Casino, MD;  Location: [facility] ENDOSCOPY;  Service: Cardiovascular;  Laterality: N/A;  . CATARACT EXTRACTION  2013  . CATARACT EXTRACTION, BILATERAL     "not sure if both eyes; feel like it probably was"  . JOINT REPLACEMENT    . PERIPHERAL VASCULAR CATHETERIZATION N/A 05/03/2016   Procedure: Abdominal Aortogram;  Surgeon: Sherren Mocha, MD;  Location: [place] CV LAB;  Service: Cardiovascular;  Laterality: N/A;  . SHOULDER ARTHROSCOPY W/ ROTATOR CUFF REPAIR Right X 3  . TEE WITHOUT CARDIOVERSION N/A 07/14/2017   Procedure: TRANSESOPHAGEAL ECHOCARDIOGRAM (TEE);  Surgeon: Pixie Casino, MD;  Location: [place];  Service: Cardiovascular;  Laterality: N/A;  . TOTAL KNEE ARTHROPLASTY Left 1990  . VAGINAL HYSTERECTOMY  1960   Family History  Problem Relation Age of Onset  . Polycystic kidney disease Mother   . Heart attack Father   . Hypertension Sister   .  Dementia Neg Hx    Social History  Substance Use Topics  . Smoking status: Never Smoker  . Smokeless tobacco: Never Used  . Alcohol use No   Current Outpatient Prescriptions  Medication Sig Dispense Refill  . acetaminophen (TYLENOL) 325 MG tablet Take 650 mg by mouth every 6 (six) hours as needed for mild pain.    [name] amLODipine (NORVASC) 5 MG tablet Take 1 tablet (5 mg total) by mouth daily. 90 tablet 3  . apixaban (ELIQUIS) 2.5 MG TABS tablet Take 1 tablet (2.5 mg total) by mouth 2 (two)  times daily. 180 tablet 3  . Cholecalciferol (VITAMIN D3) 2000 units TABS Take 2,000 Units by mouth daily.     [name] docusate sodium (COLACE) 100 MG capsule Take 100 mg by mouth 2 (two) times daily.     [name] donepezil (ARICEPT) 10 MG tablet Take 1 tablet (10 mg total) by mouth daily. 30 tablet 0  . losartan (COZAAR) 25 MG tablet Take 1 tablet (25 mg total) by mouth daily. 90 tablet 3  . metoprolol tartrate (LOPRESSOR) 50 MG tablet Take 1 tablet (50 mg total) by mouth 2 (two) times daily. 180 tablet 3  . venlafaxine (EFFEXOR) 37.5 MG tablet TAKE 1 TABLET TWICE DAILY 180 tablet 3  . furosemide (LASIX) 20 MG tablet Take 1 tablet (20 mg total) by mouth daily. (Patient not taking: Reported on 10/09/2017) 30 tablet 0   No current facility-administered medications for this visit.    Allergies  Allergen Reactions  . Bactrim [Sulfamethoxazole-Trimethoprim] Nausea Only  . Amiodarone Itching  . Amoxicillin Other (See Comments)    Unknown- ? Upset stomach  . Demerol [Meperidine] Other (See Comments)    Hallucinations  . Hydralazine Other (See Comments)    Tingling and chest pain   . Lisinopril Cough  . Zoloft [Sertraline Hcl] Other (See Comments)    Unknown  . Tape Rash and Other (See Comments)    Can tear the skin, also     Review of Systems: Positive for anxiety, arthritis, depression, fatigue and sleeping problems. All other systems reviewed and negative except where noted in HPI.    Physical Exam: BP 110/68   Pulse 72   Ht 5\' 2"  (1.575 m)   Wt 128 lb 8 oz (58.3 kg)   BMI 23.50 kg/m  Constitutional:  Well-developed, white female in no acute distress. Psychiatric: Normal mood and affect. Behavior is normal. EENT: Pupils normal.  Conjunctivae are normal. No scleral icterus. Neck supple.  Cardiovascular: Normal rate, regular rhythm. No edema Pulmonary/chest: Effort normal and breath sounds normal. No wheezing, rales or rhonchi. Abdominal: Soft, nondistended. Nontender. Bowel sounds active  throughout. There are no masses palpable. No hepatomegaly. Lymphadenopathy: No cervical adenopathy noted. Neurological: Alert and oriented to person place and time. Skin: Skin is warm and dry. No rashes noted.   ASSESSMENT AND PLAN:  26. 83 yo female, chronically anti-coagulated for Afib / TIA here for evaluation of heme positive stools and recent decline in hgb. Baseline hgb difficult to ascertain but averages 11-12 range. It fell to 10.8 just recently. At last check it was stable at 11.2. Of note the 13.9 on 07/09/17 probably inaccurate and reflection of volume status as it is not in keeping with previous hemoglobins.   -Lengthy discussion regarding options with daughter and patient. Given advanced age and cardiac problems patient is at increased risk for procedures. We discussed monitoring labs / sx of overt bleeding vrs proceeding with endoscopic workup. Daughter and patient  would like to proceed with EGD / colonoscopy if Eliquis if Cardiology feels Eliquis can safely be held. Patient will be scheduled for a EGD and colonoscopy with possible polypectomy /biopsies.  The risks and benefits of the procedure were discussed and the patient agrees to proceed.   2. Chronic anti-coagulation.  -Hold Eliquis for 2 days before procedure - will instruct when and how to resume after procedure. Explained to patient and daughter that there is a low but real risk of cardiovascular event such as heart attack, stroke, embolism / thrombosis while off Eliquis. The patient consents to proceed. Will communicate by phone or EMR with patient's prescribing provider to confirm that holding Eliquis is reasonable in this case.   Tye Savoy, NP  10/09/2017, 10:02 AM  Cc: Hollace Kinnier, MD

## 2017-10-10 ENCOUNTER — Other Ambulatory Visit: Payer: Self-pay | Admitting: *Deleted

## 2017-10-10 LAB — IRON AND TIBC
Iron Saturation: 21 % (ref 15–55)
Iron: 86 ug/dL (ref 27–139)
TIBC: 411 ug/dL (ref 250–450)
UIBC: 325 ug/dL (ref 118–369)

## 2017-10-10 MED ORDER — DONEPEZIL HCL 10 MG PO TABS: 10.0000 mg | ORAL_TABLET | Freq: Every day | ORAL | 3 refills | Status: DC

## 2017-10-10 NOTE — Telephone Encounter (Signed)
Spoke with [name] patients daughter regarding holding Eliquis prior to procedure. Per  Dr. Stanford Breed patient should hold three days prior to procedure.  Patient was instructed to start holding on 11/08/17.

## 2017-10-12 ENCOUNTER — Encounter: Payer: Self-pay | Admitting: Nurse Practitioner

## 2017-10-13 ENCOUNTER — Ambulatory Visit: Payer: Medicare Other | Admitting: Internal Medicine

## 2017-10-14 DIAGNOSIS — H353222 Exudative age-related macular degeneration, left eye, with inactive choroidal neovascularization: Secondary | ICD-10-CM | POA: Diagnosis not present

## 2017-10-14 DIAGNOSIS — H353213 Exudative age-related macular degeneration, right eye, with inactive scar: Secondary | ICD-10-CM | POA: Diagnosis not present

## 2017-10-17 ENCOUNTER — Telehealth: Payer: Self-pay | Admitting: *Deleted

## 2017-10-17 NOTE — Telephone Encounter (Signed)
Patient daughter, Alveda Reasons called stating she had some questions regarding appointment with GI and wanted to speak with Dr. Mariea Clonts. Stated that patient is going to be 42 in December. GI stated that the best way to determine the bleeding she has been having is going to be a Colonoscopy Janett Billow referred her on 10/22 due to Heatest positive stools). Daughter and family is on the edge about doing this due to patient being so small and age. Stated that everything is going good right now and doesn't want to harm her. Daughter is wondering can they do the hemoglobin again to see if the level is coming up. And wants to know your opinion regarding this. Please Advise.   Also stated BP this morning was 177/85 Heart 54 they are going to recheck again after lunch. Stated that patient was complaining about tingles this morning but stated that she always has these but when she gets up and starts moving it goes away. Stated that the caregiver is going to keep check on her and recheck blood pressure later this morning.

## 2017-10-17 NOTE — Progress Notes (Signed)
Reviewed and agree with documentation and assessment and plan. K. Veena Nandigam , MD   

## 2017-10-17 NOTE — Telephone Encounter (Signed)
Daughter stated that she will bring in patient on Monday to see Dr. Mariea Clonts to discuss and redraw labs. Appointment scheduled.

## 2017-10-17 NOTE — Telephone Encounter (Signed)
This is a conversation that probably should occur as an appointment in the office.   There is a lot to cover.  She was referred by Janett Billow because a colonoscopy is the typical next step if someone has a heme positive stool of unclear source--standard of care.  It seems reasonable to me to repeat her blood counts again to see where they stand.  She is a higher risk patient for the colonoscopy at her age.  Doing the prep will be the most challenging part.  Frail elderly tend to get very dehydrated during this and are prone to falls and worsening confusion.  If she's remaining stable as she is now, I agree we should not put her through the invasive testing.  The other side is that we will not know the source of the bleeding so it could recur.  If our goals are to keep her comfortable rather than pursue interventions if it recurs, than simple monitoring makes sense.

## 2017-10-20 ENCOUNTER — Ambulatory Visit (INDEPENDENT_AMBULATORY_CARE_PROVIDER_SITE_OTHER): Payer: Medicare Other | Admitting: Internal Medicine

## 2017-10-20 ENCOUNTER — Encounter: Payer: Self-pay | Admitting: Internal Medicine

## 2017-10-20 VITALS — BP 148/60 | HR 48 | Temp 97.8°F | Wt 128.0 lb

## 2017-10-20 DIAGNOSIS — F329 Major depressive disorder, single episode, unspecified: Secondary | ICD-10-CM | POA: Diagnosis not present

## 2017-10-20 DIAGNOSIS — K5730 Diverticulosis of large intestine without perforation or abscess without bleeding: Secondary | ICD-10-CM

## 2017-10-20 DIAGNOSIS — F419 Anxiety disorder, unspecified: Secondary | ICD-10-CM | POA: Diagnosis not present

## 2017-10-20 DIAGNOSIS — F32A Depression, unspecified: Secondary | ICD-10-CM | POA: Insufficient documentation

## 2017-10-20 DIAGNOSIS — K644 Residual hemorrhoidal skin tags: Secondary | ICD-10-CM | POA: Diagnosis not present

## 2017-10-20 DIAGNOSIS — Z7901 Long term (current) use of anticoagulants: Secondary | ICD-10-CM

## 2017-10-20 DIAGNOSIS — I4819 Other persistent atrial fibrillation: Secondary | ICD-10-CM

## 2017-10-20 DIAGNOSIS — D500 Iron deficiency anemia secondary to blood loss (chronic): Secondary | ICD-10-CM | POA: Insufficient documentation

## 2017-10-20 DIAGNOSIS — Z7189 Other specified counseling: Secondary | ICD-10-CM

## 2017-10-20 DIAGNOSIS — I481 Persistent atrial fibrillation: Secondary | ICD-10-CM | POA: Diagnosis not present

## 2017-10-20 MED ORDER — VENLAFAXINE HCL 75 MG PO TABS: 75.0000 mg | ORAL_TABLET | Freq: Two times a day (BID) | ORAL | 3 refills | Status: DC

## 2017-10-20 NOTE — Progress Notes (Addendum)
Location:  [facility] clinic Provider: Rahi Chandonnet L. Mariea Clonts, D.O., C.M.D.  Code Status: DNR Goals of Care:  Advanced Directives 10/20/2017  Does Patient Have a Medical Advance Directive? Yes  Type of Advance Directive [place]  Does patient want to make changes to medical advance directive? No - Patient declined  Copy of [place] in Chart? Yes  Would patient like information on creating a medical advance directive? -   Chief Complaint  Patient presents with  . Follow-up    discuss colonoscopy and check hemoglobin   HPI: Patient is a 83 y.o. female seen today for an acute visit for discussion about colonoscopy and f/u of blood counts.  Please refer to phone notes.  Pt was seen by GI who recommended cscope due to positive hemoccult here at [facility] and drop in hgb.  Pt is on the appropriate dose of eliquis given her age and weight.  Since the initial drop in hgb, her hgb has since trended back up.  Does have history of hemorrhoids.  Pt never sees blood.  Says she only has one piece of stool come out each day.  Gets lower abdominal pain on both sides and does have to strain to go.  Once in a while she has one good BM.  Does not miss days.  She does not eat "enough"--says she eats pretty good, but she does not like the food at her facility.  She also has had diverticular disease.    Gets chest pains which seem anxiety-related per family and pt.  She has tolerated effexor 37.5mg  po bid.  She is not sleeping well either.  She is tremulous and jittery at the appt with slight elevation of bp.  She does not remember well due to dementia (on aricept).  She has not done well on lorazepam or xanax for her anxiety.    Her family (children) has been thinking about the colonoscopy since the GI appt and they don't think it's in her best interest given the difficulty doing the prep, risks of the procedure itself in an 83 yo and because they would not pursue any aggressive therapies  if a colon mass were found.  They are ok with not knowing the cause of the drop in hemoglobin.  They don't want their mother to go through anything stressful.  Past Medical History:  Diagnosis Date  . Abnormal weight loss 05/28/2004  . Anxiety 01/17/2003  . Back pain 08/19/2005   with radiculopathy  . Bradycardia, drug induced    Diltiazem  . Cerebral atherosclerosis 09/24/1999  . Chest pain, atypical 04/2016   minor CAD at cath   . Chronic anticoagulation    Eliquis  . Dementia    "don't know kind or stage" (05/02/2016)  . Depression 09/06/2002  . Diverticulitis 09/28/2007  . External hemorrhoids 04/12/2009  . Female climacteric state 03/04/2000  . 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 GERD (gastroesophageal reflux disease)   . Hypertensive cardiovascular disease    moderate LVH on echo march 2017  . Macular degeneration   . Malaise and fatigue   . 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 Osteoarthrosis, localized   . Osteoporosis 05/28/2004  . Paranoia ([place])   . Paroxysmal A-fib ([place])   . TIA (transient ischemic attack) 2000s?  [name] Urinary incontinence 08/18/2007  . UTI (urinary tract infection)   . Vaginitis, atrophic   .  Vertigo, peripheral 10/07/2000    Past Surgical History:  Procedure Laterality Date  . BRAIN SURGERY  2000   Neurocysticercosis ("parasite")  . CATARACT EXTRACTION  2013  . CATARACT EXTRACTION, BILATERAL     "not sure if both eyes; feel like it probably was"  . JOINT REPLACEMENT    . SHOULDER ARTHROSCOPY W/ ROTATOR CUFF REPAIR Right X 3  . TOTAL KNEE ARTHROPLASTY Left 1990  . VAGINAL HYSTERECTOMY  1960    Allergies  Allergen Reactions  . Bactrim [Sulfamethoxazole-Trimethoprim] Nausea Only  . Amiodarone Itching  . Amoxicillin Other (See Comments)    Unknown- ? Upset stomach  . Demerol [Meperidine] Other (See Comments)    Hallucinations  . Hydralazine Other (See Comments)     Tingling and chest pain   . Lisinopril Cough  . Zoloft [Sertraline Hcl] Other (See Comments)    Unknown  . Tape Rash and Other (See Comments)    Can tear the skin, also    Outpatient Encounter Medications as of 10/20/2017  Medication Sig  . acetaminophen (TYLENOL) 325 MG tablet Take 650 mg by mouth every 6 (six) hours as needed for mild pain.  [name] amLODipine (NORVASC) 5 MG tablet Take 1 tablet (5 mg total) by mouth daily.  [name] apixaban (ELIQUIS) 2.5 MG TABS tablet Take 1 tablet (2.5 mg total) by mouth 2 (two) times daily.  . Cholecalciferol (VITAMIN D3) 2000 units TABS Take 2,000 Units by mouth daily.   [name] docusate sodium (COLACE) 100 MG capsule Take 100 mg by mouth 2 (two) times daily.   [name] donepezil (ARICEPT) 10 MG tablet Take 1 tablet (10 mg total) by mouth daily.  . furosemide (LASIX) 20 MG tablet Take 1 tablet (20 mg total) by mouth daily.  [name] losartan (COZAAR) 25 MG tablet Take 1 tablet (25 mg total) by mouth daily.  . metoprolol tartrate (LOPRESSOR) 50 MG tablet Take 1 tablet (50 mg total) by mouth 2 (two) times daily.  . Na Sulfate-K Sulfate-Mg Sulf 17.5-3.13-1.6 GM/177ML SOLN Suprep-Use as directed  . venlafaxine (EFFEXOR) 37.5 MG tablet TAKE 1 TABLET TWICE DAILY   No facility-administered encounter medications on file as of 10/20/2017.     Review of Systems:  Review of Systems  Constitutional: Positive for malaise/fatigue. Negative for chills and fever.  HENT: Positive for hearing loss. Negative for congestion.   Eyes: Positive for blurred vision.       Vision worse to where she's no longer able to read  Respiratory: Negative for shortness of breath.   Cardiovascular: Positive for chest pain. Negative for palpitations and leg swelling.  Gastrointestinal: Positive for abdominal pain and heartburn. Negative for blood in stool, constipation, diarrhea, melena, nausea and vomiting.       Heme positive stool at office visit with NP  Genitourinary: Negative for dysuria.    Musculoskeletal: Negative for falls.  Skin: Negative for itching and rash.  Neurological: Positive for weakness. Negative for dizziness and loss of consciousness.  Endo/Heme/Allergies: Bruises/bleeds easily.  Psychiatric/Behavioral: Positive for depression and memory loss. Negative for hallucinations. The patient is nervous/anxious and has insomnia.        No reports of hallucinations now    Health Maintenance  Topic Date Due  . TETANUS/TDAP  09/08/2022  . INFLUENZA VACCINE  Completed  . DEXA SCAN  Completed  . PNA vac Low Risk Adult  Completed    Physical Exam: Vitals:   10/20/17 1041  BP: (!) 148/60  Pulse: (!) 48  Temp: 97.8 F (36.6  C)  TempSrc: Oral  SpO2: 96%  Weight: 128 lb (58.1 kg)   Body mass index is 23.41 kg/m. Physical Exam  Constitutional: She appears well-developed. No distress.  anxious  Cardiovascular: Intact distal pulses.  Murmur heard. irreg irreg  Pulmonary/Chest: Effort normal and breath sounds normal. No respiratory distress.  Abdominal: Soft. Bowel sounds are normal. She exhibits no distension. There is tenderness.  Mild tenderness in bilateral lower quadrants  Genitourinary: Rectal exam shows guaiac negative stool.  Genitourinary Comments: Small external non-bleeding hemorrhoids, hard brown stool in rectal vault, hemoccult negative  Musculoskeletal: Normal range of motion.  Ambulates unsteadily w/o assistive device today  Neurological: She is alert.  Skin: Skin is warm and dry.  Psychiatric:  Anxious and jittery, tremulous hands; poor historian, difficulty with word-finding    Labs reviewed: Basic Metabolic Panel: Recent Labs    06/27/17 1208  07/11/17 0233  07/17/17 0244  07/23/17 1011 09/25/17 1640 10/06/17 0954  NA 136   < > 131*   < > 133*   < > 134 134* 136  K 4.2   < > 4.1   < > 4.4   < > 4.2 4.9 3.9  CL 103   < > 100*   < > 96*   < > 97 103 99  CO2 19*   < > 23   < > 24   < > 23 21 26   GLUCOSE 104*   < > 113*   < > 94   <  > 84 112 119*  BUN 34*   < > 29*   < > 25*   < > 23 23 21   CREATININE 1.23*   < > 1.70*   < > 1.33*   < > 1.14* 1.11* 1.19*  CALCIUM 9.0   < > 8.6*   < > 8.7*   < > 9.2 9.3 9.4  MG  --   --  1.8  --  1.7  --   --   --   --   TSH 5.35*  --   --   --   --   --   --   --   --    < > = values in this interval not displayed.   Liver Function Tests: Recent Labs    05/01/17 1129  AST 17  ALT 11  ALKPHOS 87  BILITOT 0.6  PROT 6.8  ALBUMIN 3.6   Recent Labs    05/01/17 1119  LIPASE 48  AMYLASE 46   No results for input(s): AMMONIA in the last 8760 hours. CBC: Recent Labs    07/09/17 1321 09/25/17 1640 10/06/17 0954  WBC 9.8 6.0 5.6  NEUTROABS 6.8 4,524 3,909  HGB 11.4*  13.9 10.8* 11.2*  HCT 34.2*  41.0 32.6* 34.5*  MCV 85.9 84.9 85.8  PLT 242 268 271   Lipid Panel: No results for input(s): CHOL, HDL, LDLCALC, TRIG, CHOLHDL, LDLDIRECT in the last 8760 hours. Lab Results  Component Value Date   HGBA1C 6.2 (H) 05/02/2016    Procedures since last visit: Dg Chest 2 View  Result Date: 09/26/2017 CLINICAL DATA:  Shortness of breath. EXAM: CHEST  2 VIEW COMPARISON:  07/16/2017 . FINDINGS: Cardiomegaly again noted. Previously identified pulmonary interstitial prominence noted on chest x-ray of 07/16/2017 has largely cleared. There is mild atelectasis and infiltrates/edema in the lung bases. Small bilateral pleural effusions. No pneumothorax. IMPRESSION: 1. Previously identified pulmonary interstitial prominence noted on chest x-ray of 07/16/2017 has largely  cleared. 2. There is mild basilar atelectasis and infiltrates/ edema lung bases. Small bilateral pleural effusions . Electronically Signed   By: Marcello Moores  Register   On: 09/26/2017 10:16    Assessment/Plan 1. Iron deficiency anemia secondary to blood loss (chronic) - family and patient do not want to pursue cscope -suspect any bleeding was diverticular given fairly decent drop in h/h, but if due to a precancerous polyp or  colon cancer, pt and family do not want to pursue aggressive interventions--favor comfort and "QOL" at this point -opted to repeat hemoccult which was negative today and repeat h/h - CBC with Differential/Platelet -if h/h stable or improved, will cancel cscope  2. Diverticulosis of large intestine without hemorrhage --may have been source of bleeding with her constipation and blood thinner use  3. Current use of long term anticoagulation -continues on eliquis 2.5mg  po bid for afib hypercoagulable state  4. Persistent atrial fibrillation (HCC) -rate controlled on current therapy, cont lopressor and eliquis  5. External hemorrhoids -noted, small, non-bleeding, would not explain a drop in hgb--pt asymptomatic from these  6. Anxiety and depression -increase effexor in hopes of better control of anxiety and panic attacks she has that cause chest pain -had negative cardiac workup more than once - venlafaxine (EFFEXOR) 75 MG tablet; Take 1 tablet (75 mg total) 2 (two) times daily by mouth.  Dispense: 180 tablet; Refill: 3 -avoid benzos which she does not tolerate well  7.  Advance care planning -discussed with daughters today, [name] and Helene Kelp, and patient -they do not want to pursue aggressive testing or workups at this point due to mom's dementia and frail mental state--20 minutes were spent on ACP and remainder of visit was spent on medical mgt -avoid cscope at this point -need to discuss code status at next visit and MOST form  Labs/tests ordered:   Orders Placed This Encounter  Procedures  . CBC with Differential/Platelet   Next appt:  11/20/2017--keep to see how she's doing with her anxiety   Maebry Obrien L. Jaysten Essner, D.O. [facility] [address], [place] [address] Cell Phone (Mon-Fri 8am-5pm):  [phone] On Call:  [phone] & follow prompts after 5pm & weekends Office Phone:  [phone] Office Fax:  [phone]

## 2017-10-20 NOTE — Patient Instructions (Addendum)
Let's hold off on the colonoscopy.  Check hemoglobin today--will call with results.  We'll increase the effexor to 75mg  twice a day from 37.5mg  twice a day to see if it helps your anxiety and sleep.

## 2017-10-21 LAB — CBC WITH DIFFERENTIAL/PLATELET
Basophils Absolute: 50 cells/uL (ref 0–200)
Basophils Relative: 0.9 %
Eosinophils Absolute: 151 cells/uL (ref 15–500)
Eosinophils Relative: 2.7 %
HCT: 32.4 % — ABNORMAL LOW (ref 35.0–45.0)
Hemoglobin: 10.9 g/dL — ABNORMAL LOW (ref 11.7–15.5)
Lymphs Abs: 1058 cells/uL (ref 850–3900)
MCH: 28.8 pg (ref 27.0–33.0)
MCHC: 33.6 g/dL (ref 32.0–36.0)
MCV: 85.7 fL (ref 80.0–100.0)
MPV: 11.2 fL (ref 7.5–12.5)
Monocytes Relative: 10.4 %
Neutro Abs: 3758 cells/uL (ref 1500–7800)
Neutrophils Relative %: 67.1 %
Platelets: 264 10*3/uL (ref 140–400)
RBC: 3.78 10*6/uL — ABNORMAL LOW (ref 3.80–5.10)
RDW: 15.2 % — ABNORMAL HIGH (ref 11.0–15.0)
Total Lymphocyte: 18.9 %
WBC mixed population: 582 cells/uL (ref 200–950)
WBC: 5.6 10*3/uL (ref 3.8–10.8)

## 2017-10-23 ENCOUNTER — Telehealth: Payer: Self-pay | Admitting: *Deleted

## 2017-10-23 ENCOUNTER — Other Ambulatory Visit: Payer: Self-pay | Admitting: *Deleted

## 2017-10-23 NOTE — Telephone Encounter (Signed)
[name] Notified and agreed. She will get Prenatal Vitamin OTC. She will also cancel the Colonoscopy. (See labs)

## 2017-10-23 NOTE — Telephone Encounter (Signed)
-----   Message from Gayland Curry, DO sent at 10/21/2017  9:50 AM EST ----- Hgb has declined again slightly.  Hemoccult was negative yesterday.  Let's start her on a prenatal vitamin daily for her iron deficiency (rather than an iron supplement b/c she already has difficulty with nausea and reflux and iron tends to worsen that on its own).  We can recheck her cbc when she comes in to see me next month.  I would still favor canceling the cscope.

## 2017-10-28 ENCOUNTER — Ambulatory Visit: Payer: Self-pay | Admitting: Nurse Practitioner

## 2017-10-28 ENCOUNTER — Encounter: Payer: Self-pay | Admitting: Nurse Practitioner

## 2017-10-28 ENCOUNTER — Ambulatory Visit (INDEPENDENT_AMBULATORY_CARE_PROVIDER_SITE_OTHER): Payer: Medicare Other | Admitting: Nurse Practitioner

## 2017-10-28 VITALS — BP 142/78 | HR 70 | Temp 98.2°F | Resp 18 | Ht 62.0 in | Wt 131.0 lb

## 2017-10-28 DIAGNOSIS — R0602 Shortness of breath: Secondary | ICD-10-CM

## 2017-10-28 NOTE — Progress Notes (Signed)
Careteam: Patient Care Team: Gayland Curry, DO as PCP - General (Geriatric Medicine) Sanda Klein, MD as Consulting Physician (Cardiology) Sherlynn Stalls, MD as Consulting Physician (Ophthalmology) Murriel Hopper, MD as Referring Physician (Student)  Advanced Directive information Does Patient Have a Medical Advance Directive?: Yes, Type of Advance Directive: [place], Does patient want to make changes to medical advance directive?: No - Patient declined  Allergies  Allergen Reactions  . Bactrim [Sulfamethoxazole-Trimethoprim] Nausea Only  . Amiodarone Itching  . Amoxicillin Other (See Comments)    Unknown- ? Upset stomach  . Demerol [Meperidine] Other (See Comments)    Hallucinations  . Hydralazine Other (See Comments)    Tingling and chest pain   . Lisinopril Cough  . Zoloft [Sertraline Hcl] Other (See Comments)    Unknown  . Tape Rash and Other (See Comments)    Can tear the skin, also    Chief Complaint  Patient presents with  . Acute Visit    Pt is being seen for shortness of breath that worsens with activity. Pt reports SOB for about a week.   . Other    Daughter, [name], in room     HPI: Patient is a 83 y.o. female seen in the office today due to shortness of breath with activity. Over the last 3 days she has become more short of breath.  She was here with Dr Mariea Clonts on 10/20/17, effexor was increased.  Now reports "weird dreams" were she thinks her children are in her apartment. She gets up and is confused. Able to get back to bed easily. Has been having this for several weeks, comes and goes.   Shortness of breath more so when she is ambulating. Has noticed it when sitting but rare. Sometimes it happens when she is "thinking about something"  Also notices increase shortness of breath when she is doing chores. Also feeling more fatigue.  Was having it once in awhile but now staying with her longer. Not currently experiencing this.     Reports she is eating less (weight up- currently 131 from 128 on 11/12 No swelling noted.   Pt was here on 09/29/17 with increase shortness of breath and noted to have There is mild basilar atelectasis and infiltrates/ edema lung bases. Small bilateral pleural effusions. On xray, lasix 20 mg  was given with good results.   Review of Systems:  Review of Systems  Constitutional: Positive for malaise/fatigue. Negative for chills, fever and weight loss.  HENT: Negative for tinnitus.   Respiratory: Positive for shortness of breath. Negative for cough, sputum production and wheezing.   Cardiovascular: Positive for chest pain. Negative for palpitations and leg swelling.  Gastrointestinal: Negative for abdominal pain, constipation, diarrhea and heartburn.  Genitourinary: Negative for dysuria, frequency and urgency.  Skin: Negative.   Neurological: Positive for weakness. Negative for dizziness and headaches.  Psychiatric/Behavioral: Positive for memory loss.    Past Medical History:  Diagnosis Date  . Abnormal weight loss 05/28/2004  . Anxiety 01/17/2003  . Back pain 08/19/2005   with radiculopathy  . Bradycardia, drug induced    Diltiazem  . Cerebral atherosclerosis 09/24/1999  . Chest pain, atypical 04/2016   minor CAD at cath   . Chronic anticoagulation    Eliquis  . Dementia    "don't know kind or stage" (05/02/2016)  . Depression 09/06/2002  . Diverticulitis 09/28/2007  . External hemorrhoids 04/12/2009  . Female climacteric state 03/04/2000  . Formed visual hallucinations  [name] Syndrome?  Failed Keppra and Depakote  . GERD (gastroesophageal reflux disease)   . Hypertensive cardiovascular disease    moderate LVH on echo march 2017  . Macular degeneration   . Malaise and fatigue   . 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 Osteoarthrosis, localized   . Osteoporosis 05/28/2004  . Paranoia ([place])   . Paroxysmal A-fib ([place])   . TIA (transient  ischemic attack) 2000s?  [name] Urinary incontinence 08/18/2007  . UTI (urinary tract infection)   . Vaginitis, atrophic   . Vertigo, peripheral 10/07/2000   Past Surgical History:  Procedure Laterality Date  . Abdominal Aortogram N/A 05/03/2016   Performed by Sherren Mocha, MD at [place] [place] [facility]  . BRAIN SURGERY  2000   Neurocysticercosis ("parasite")  . CARDIOVERSION N/A 07/14/2017   Performed by Pixie Casino, MD at [facility] ENDOSCOPY  . CATARACT EXTRACTION  2013  . CATARACT EXTRACTION, BILATERAL     "not sure if both eyes; feel like it probably was"  . JOINT REPLACEMENT    . Left Heart Cath and Coronary Angiography N/A 05/03/2016   Performed by Sherren Mocha, MD at [place] [facility]  . SHOULDER ARTHROSCOPY W/ ROTATOR CUFF REPAIR Right X 3  . TOTAL KNEE ARTHROPLASTY Left 1990  . TRANSESOPHAGEAL ECHOCARDIOGRAM (TEE) N/A 07/14/2017   Performed by Pixie Casino, MD at [facility] ENDOSCOPY  . VAGINAL HYSTERECTOMY  1960   Social History:   reports that  has never smoked. she has never used smokeless tobacco. She reports that she does not drink alcohol or use drugs.  Family History  Problem Relation Age of Onset  . Polycystic kidney disease Mother   . Heart attack Father   . Hypertension Sister   . Dementia Neg Hx     Medications:   Medication List        Accurate as of 10/28/17 10:37 AM. Always use your most recent med list.          acetaminophen 325 MG tablet Commonly known as:  TYLENOL   amLODipine 5 MG tablet Commonly known as:  NORVASC Take 1 tablet (5 mg total) by mouth daily.   apixaban 2.5 MG Tabs tablet Commonly known as:  ELIQUIS Take 1 tablet (2.5 mg total) by mouth 2 (two) times daily.   docusate sodium 100 MG capsule Commonly known as:  COLACE   donepezil 10 MG tablet Commonly known as:  ARICEPT Take 1 tablet (10 mg total) by mouth daily.   furosemide 20 MG tablet Commonly known as:  LASIX Take 1 tablet (20 mg total) by mouth daily.   losartan 25 MG  tablet Commonly known as:  COZAAR Take 1 tablet (25 mg total) by mouth daily.   metoprolol tartrate 50 MG tablet Commonly known as:  LOPRESSOR   PRENATAL VITAMIN PO   venlafaxine 75 MG tablet Commonly known as:  EFFEXOR Take 1 tablet (75 mg total) 2 (two) times daily by mouth.   Vitamin D3 2000 units Tabs        Physical Exam:  Vitals:   10/28/17 1032  BP: (!) 142/78  Pulse: 70  Resp: 18  Temp: 98.2 F (36.8 C)  TempSrc: Oral  SpO2: 92%  Weight: 131 lb (59.4 kg)  Height: '5\' 2"'  (1.575 m)   Body mass index is 23.96 kg/m.  Physical Exam  Constitutional: She appears well-developed. No distress.  anxious  Cardiovascular: Intact distal pulses. An irregularly irregular rhythm  present.  Murmur heard. Pulmonary/Chest: Effort normal and breath sounds normal. No respiratory distress.  Abdominal: Soft. Bowel sounds are normal.  Genitourinary: Rectal exam shows guaiac negative stool.  Musculoskeletal: Normal range of motion.  Ambulates unsteadily w/o assistive device today  Neurological: She is alert.  Skin: Skin is warm and dry.  Psychiatric:  poor historian, difficulty with word-finding    Labs reviewed: Basic Metabolic Panel: Recent Labs    06/27/17 1208  07/11/17 0233  07/17/17 0244  07/23/17 1011 09/25/17 1640 10/06/17 0954  NA 136   < > 131*   < > 133*   < > 134 134* 136  K 4.2   < > 4.1   < > 4.4   < > 4.2 4.9 3.9  CL 103   < > 100*   < > 96*   < > 97 103 99  CO2 19*   < > 23   < > 24   < > '23 21 26  ' GLUCOSE 104*   < > 113*   < > 94   < > 84 112 119*  BUN 34*   < > 29*   < > 25*   < > '23 23 21  ' CREATININE 1.23*   < > 1.70*   < > 1.33*   < > 1.14* 1.11* 1.19*  CALCIUM 9.0   < > 8.6*   < > 8.7*   < > 9.2 9.3 9.4  MG  --   --  1.8  --  1.7  --   --   --   --   TSH 5.35*  --   --   --   --   --   --   --   --    < > = values in this interval not displayed.   Liver Function Tests: Recent Labs    05/01/17 1129  AST 17  ALT 11  ALKPHOS 87  BILITOT  0.6  PROT 6.8  ALBUMIN 3.6   Recent Labs    05/01/17 1119  LIPASE 48  AMYLASE 46   No results for input(s): AMMONIA in the last 8760 hours. CBC: Recent Labs    09/25/17 1640 10/06/17 0954 10/20/17 1200  WBC 6.0 5.6 5.6  NEUTROABS 4,524 3,909 3,758  HGB 10.8* 11.2* 10.9*  HCT 32.6* 34.5* 32.4*  MCV 84.9 85.8 85.7  PLT 268 271 264   Lipid Panel: No results for input(s): CHOL, HDL, LDLCALC, TRIG, CHOLHDL, LDLDIRECT in the last 8760 hours. TSH: Recent Labs    06/27/17 1208  TSH 5.35*   A1C: Lab Results  Component Value Date   HGBA1C 6.2 (H) 05/02/2016     Assessment/Plan 1. Shortness of breath -due to increase weight, will restart lasix at this time to see if this helps with symptoms.  O2 also noted on 92%, previously 95-98% -will follow up blood work to make sure hgb has not dropped further  - Brain Natriuretic Peptide, appears this was mildly elevated in the past  - BMP with eGFR - CBC with Differential/Platelets  Next appt: as scheduled with Dr Mariea Clonts in 1 months, sooner if needed  Tamarah Bhullar K. Harle Battiest  [facility] [phone] 8 am - 5 pm) [phone] (after hours)

## 2017-10-28 NOTE — Patient Instructions (Signed)
To restart Lasix 20 mg daily   Notify if no improvement with starting lasix

## 2017-10-29 ENCOUNTER — Other Ambulatory Visit: Payer: Self-pay | Admitting: Nurse Practitioner

## 2017-10-29 DIAGNOSIS — I5032 Chronic diastolic (congestive) heart failure: Secondary | ICD-10-CM

## 2017-10-29 LAB — BASIC METABOLIC PANEL WITH GFR
BUN / CREAT RATIO: 20 (calc) (ref 6–22)
BUN: 24 mg/dL (ref 7–25)
CHLORIDE: 100 mmol/L (ref 98–110)
CO2: 24 mmol/L (ref 20–32)
CREATININE: 1.18 mg/dL — AB (ref 0.60–0.88)
Calcium: 9.1 mg/dL (ref 8.6–10.4)
GFR, EST NON AFRICAN AMERICAN: 41 mL/min/{1.73_m2} — AB (ref >=60)
GFR, Est African American: 48 mL/min/{1.73_m2} — ABNORMAL LOW (ref >=60)
GLUCOSE: 109 mg/dL (ref 65–139)
Potassium: 3.7 mmol/L (ref 3.5–5.3)
Sodium: 134 mmol/L — ABNORMAL LOW (ref 135–146)

## 2017-10-29 LAB — CBC WITH DIFFERENTIAL/PLATELET
BASOS ABS: 51 {cells}/uL (ref 0–200)
Basophils Relative: 0.9 %
EOS PCT: 1.2 %
Eosinophils Absolute: 68 cells/uL (ref 15–500)
HEMATOCRIT: 32 % — AB (ref 35.0–45.0)
HEMOGLOBIN: 10.6 g/dL — AB (ref 11.7–15.5)
LYMPHS ABS: 570 {cells}/uL — AB (ref 850–3900)
MCH: 28.6 pg (ref 27.0–33.0)
MCHC: 33.1 g/dL (ref 32.0–36.0)
MCV: 86.5 fL (ref 80.0–100.0)
MONOS PCT: 8.6 %
MPV: 11.8 fL (ref 7.5–12.5)
NEUTROS ABS: 4520 {cells}/uL (ref 1500–7800)
Neutrophils Relative %: 79.3 %
Platelets: 198 10*3/uL (ref 140–400)
RBC: 3.7 10*6/uL — AB (ref 3.80–5.10)
RDW: 15.3 % — ABNORMAL HIGH (ref 11.0–15.0)
Total Lymphocyte: 10 %
WBC mixed population: 490 cells/uL (ref 200–950)
WBC: 5.7 10*3/uL (ref 3.8–10.8)

## 2017-10-29 LAB — BRAIN NATRIURETIC PEPTIDE: BRAIN NATRIURETIC PEPTIDE: 1132 pg/mL — AB (ref <100)

## 2017-11-05 ENCOUNTER — Other Ambulatory Visit: Payer: Medicare Other

## 2017-11-05 DIAGNOSIS — I5032 Chronic diastolic (congestive) heart failure: Secondary | ICD-10-CM | POA: Diagnosis not present

## 2017-11-05 LAB — BASIC METABOLIC PANEL WITH GFR
BUN/Creatinine Ratio: 17 (calc) (ref 6–22)
BUN: 22 mg/dL (ref 7–25)
CALCIUM: 9.2 mg/dL (ref 8.6–10.4)
CHLORIDE: 97 mmol/L — AB (ref 98–110)
CO2: 27 mmol/L (ref 20–32)
Creat: 1.33 mg/dL — ABNORMAL HIGH (ref 0.60–0.88)
GFR, Est African American: 42 mL/min/{1.73_m2} — ABNORMAL LOW (ref >=60)
GFR, Est Non African American: 36 mL/min/{1.73_m2} — ABNORMAL LOW (ref >=60)
GLUCOSE: 109 mg/dL (ref 65–139)
POTASSIUM: 3.5 mmol/L (ref 3.5–5.3)
Sodium: 135 mmol/L (ref 135–146)

## 2017-11-11 ENCOUNTER — Encounter: Payer: Medicare Other | Admitting: Gastroenterology

## 2017-11-13 DIAGNOSIS — B079 Viral wart, unspecified: Secondary | ICD-10-CM | POA: Diagnosis not present

## 2017-11-13 DIAGNOSIS — M79672 Pain in left foot: Secondary | ICD-10-CM | POA: Diagnosis not present

## 2017-11-20 ENCOUNTER — Encounter: Payer: Self-pay | Admitting: Internal Medicine

## 2017-11-20 ENCOUNTER — Ambulatory Visit (INDEPENDENT_AMBULATORY_CARE_PROVIDER_SITE_OTHER): Payer: Medicare Other | Admitting: Internal Medicine

## 2017-11-20 VITALS — BP 158/80 | HR 52 | Temp 97.7°F | Wt 129.0 lb

## 2017-11-20 DIAGNOSIS — H353233 Exudative age-related macular degeneration, bilateral, with inactive scar: Secondary | ICD-10-CM | POA: Diagnosis not present

## 2017-11-20 DIAGNOSIS — I5032 Chronic diastolic (congestive) heart failure: Secondary | ICD-10-CM | POA: Diagnosis not present

## 2017-11-20 DIAGNOSIS — D500 Iron deficiency anemia secondary to blood loss (chronic): Secondary | ICD-10-CM

## 2017-11-20 DIAGNOSIS — I10 Essential (primary) hypertension: Secondary | ICD-10-CM

## 2017-11-20 DIAGNOSIS — Z961 Presence of intraocular lens: Secondary | ICD-10-CM | POA: Diagnosis not present

## 2017-11-20 DIAGNOSIS — H353232 Exudative age-related macular degeneration, bilateral, with inactive choroidal neovascularization: Secondary | ICD-10-CM | POA: Diagnosis not present

## 2017-11-20 MED ORDER — FUROSEMIDE 20 MG PO TABS: 20.0000 mg | ORAL_TABLET | Freq: Every day | ORAL | 3 refills | Status: DC | PRN

## 2017-11-20 NOTE — Progress Notes (Signed)
Location:  [facility] clinic Provider:  Rihan Schueler L. Mariea Clonts, D.O., C.M.D.  Code Status: DNR Goals of Care:  Advanced Directives 10/28/2017  Does Patient Have a Medical Advance Directive? Yes  Type of Advance Directive [place]  Does patient want to make changes to medical advance directive? No - Patient declined  Copy of [place] in Chart? -  Would patient like information on creating a medical advance directive? -   Chief Complaint  Patient presents with  . Medical Management of Chronic Issues    28mth follow-up, discuss prenatal vitamins, discuss lasix    HPI: Patient is a 83 y.o. female seen today for medical management of chronic diseases.    Prenatal vitamins are upsetting her stomach--has tried two kinds.  Still getting diarrhea in the morning with them.  That was the only change.  Has a snack with them sometimes not others.  Discussed being certain she gets it with food.    Is not a fan of the cooking at her facility.    She was more sob when she saw Janett Billow and her BNP was elevated.  She was put back on lasix.  She is down to 129 lbs from 131lbs after lasix.  Doing well now w/o sob.  Out of lasix since 12/11.  Has diastolic chf.  EF 24-26%.  Abdominal girth back to baseline.    BP recheck actually went up to 162/90.  She reports taking her medication this am, but her daughter questions this.    Past Medical History:  Diagnosis Date  . Abnormal weight loss 05/28/2004  . Anxiety 01/17/2003  . Back pain 08/19/2005   with radiculopathy  . Bradycardia, drug induced    Diltiazem  . Cerebral atherosclerosis 09/24/1999  . Chest pain, atypical 04/2016   minor CAD at cath   . Chronic anticoagulation    Eliquis  . Dementia    "don't know kind or stage" (05/02/2016)  . Depression 09/06/2002  . Diverticulitis 09/28/2007  . External hemorrhoids 04/12/2009  . Female climacteric state 03/04/2000  . Formed visual hallucinations    [name] Syndrome?   Failed Keppra and Depakote  . GERD (gastroesophageal reflux disease)   . Hypertensive cardiovascular disease    moderate LVH on echo march 2017  . Macular degeneration   . Malaise and fatigue   . Neurocysticercosis    Craniotomy at [facility] in early 2000s  . Osteoarthrosis, localized   . Osteoporosis 05/28/2004  . Paranoia ([place])   . Paroxysmal A-fib ([place])   . TIA (transient ischemic attack) 2000s?  [name] Urinary incontinence 08/18/2007  . UTI (urinary tract infection)   . Vaginitis, atrophic   . Vertigo, peripheral 10/07/2000    Past Surgical History:  Procedure Laterality Date  . BRAIN SURGERY  2000   Neurocysticercosis ("parasite")  . CARDIAC CATHETERIZATION N/A 05/03/2016   Procedure: Left Heart Cath and Coronary Angiography;  Surgeon: Sherren Mocha, MD;  Location: [place] CV LAB;  Service: Cardiovascular;  Laterality: N/A;  . CARDIOVERSION N/A 07/14/2017   Procedure: CARDIOVERSION;  Surgeon: Pixie Casino, MD;  Location: [facility] ENDOSCOPY;  Service: Cardiovascular;  Laterality: N/A;  . CATARACT EXTRACTION  2013  . CATARACT EXTRACTION, BILATERAL     "not sure if both eyes; feel like it probably was"  . JOINT REPLACEMENT    . PERIPHERAL VASCULAR CATHETERIZATION N/A 05/03/2016   Procedure: Abdominal Aortogram;  Surgeon: Sherren Mocha, MD;  Location: [place] CV LAB;  Service: Cardiovascular;  Laterality: N/A;  . SHOULDER ARTHROSCOPY W/ ROTATOR CUFF REPAIR Right X 3  . TEE WITHOUT CARDIOVERSION N/A 07/14/2017   Procedure: TRANSESOPHAGEAL ECHOCARDIOGRAM (TEE);  Surgeon: Pixie Casino, MD;  Location: [place];  Service: Cardiovascular;  Laterality: N/A;  . TOTAL KNEE ARTHROPLASTY Left 1990  . VAGINAL HYSTERECTOMY  1960    Allergies  Allergen Reactions  . Bactrim [Sulfamethoxazole-Trimethoprim] Nausea Only  . Amiodarone Itching  . Amoxicillin Other (See Comments)    Unknown- ? Upset stomach  . Demerol [Meperidine] Other (See Comments)    Hallucinations  . Hydralazine  Other (See Comments)    Tingling and chest pain   . Lisinopril Cough  . Zoloft [Sertraline Hcl] Other (See Comments)    Unknown  . Tape Rash and Other (See Comments)    Can tear the skin, also    Outpatient Encounter Medications as of 11/20/2017  Medication Sig  . acetaminophen (TYLENOL) 325 MG tablet Take 650 mg by mouth every 6 (six) hours as needed for mild pain.  [name] amLODipine (NORVASC) 5 MG tablet Take 1 tablet (5 mg total) by mouth daily.  [name] apixaban (ELIQUIS) 2.5 MG TABS tablet Take 1 tablet (2.5 mg total) by mouth 2 (two) times daily.  . Cholecalciferol (VITAMIN D3) 2000 units TABS Take 2,000 Units by mouth daily.   [name] docusate sodium (COLACE) 100 MG capsule Take 100 mg by mouth 2 (two) times daily.   [name] donepezil (ARICEPT) 10 MG tablet Take 1 tablet (10 mg total) by mouth daily.  . furosemide (LASIX) 20 MG tablet Take 1 tablet (20 mg total) by mouth daily.  [name] losartan (COZAAR) 25 MG tablet Take 1 tablet (25 mg total) by mouth daily.  . metoprolol tartrate (LOPRESSOR) 50 MG tablet Take 50 mg by mouth 2 (two) times daily.  [name] venlafaxine (EFFEXOR) 75 MG tablet Take 1 tablet (75 mg total) 2 (two) times daily by mouth.  . [DISCONTINUED] Prenatal Vit-Fe Fumarate-FA (PRENATAL VITAMIN PO) Take by mouth. Take one tablet by mouth once daily   No facility-administered encounter medications on file as of 11/20/2017.     Review of Systems:  Review of Systems  Constitutional: Negative for chills, fever and malaise/fatigue.  HENT: Positive for hearing loss.   Eyes: Positive for blurred vision.       Glasses  Respiratory: Negative for cough and shortness of breath.   Cardiovascular: Negative for chest pain, palpitations and leg swelling.  Gastrointestinal: Positive for diarrhea and nausea. Negative for abdominal pain.  Genitourinary: Negative for dysuria.  Musculoskeletal: Negative for falls.       Unsteady gait  Neurological: Negative for dizziness, loss of consciousness and weakness.    Endo/Heme/Allergies: Bruises/bleeds easily.  Psychiatric/Behavioral: Positive for depression and memory loss.    Health Maintenance  Topic Date Due  . TETANUS/TDAP  09/08/2022  . INFLUENZA VACCINE  Completed  . DEXA SCAN  Completed  . PNA vac Low Risk Adult  Completed    Physical Exam: Vitals:   11/20/17 1307  BP: (!) 158/80  Pulse: (!) 52  Temp: 97.7 F (36.5 C)  TempSrc: Oral  SpO2: 97%  Weight: 129 lb (58.5 kg)   Body mass index is 23.59 kg/m. Physical Exam  Constitutional: She is oriented to person, place, and time. She appears well-developed. No distress.  Cardiovascular: Intact distal pulses.  irreg irreg  Pulmonary/Chest: Effort normal and breath sounds normal. She has no rales.  Abdominal: Soft. Bowel sounds are normal. She exhibits no distension. There is  no tenderness.  Musculoskeletal: Normal range of motion.  Unsteady gait, holds onto her daughter rather than using assistive device  Neurological: She is alert and oriented to person, place, and time.  Skin: Skin is warm and dry.  Psychiatric: She has a normal mood and affect.    Labs reviewed: Basic Metabolic Panel: Recent Labs    06/27/17 1208  07/11/17 0233  07/17/17 0244  10/06/17 0954 10/28/17 1105 11/05/17 1053  NA 136   < > 131*   < > 133*   < > 136 134* 135  K 4.2   < > 4.1   < > 4.4   < > 3.9 3.7 3.5  CL 103   < > 100*   < > 96*   < > 99 100 97*  CO2 19*   < > 23   < > 24   < > 26 24 27   GLUCOSE 104*   < > 113*   < > 94   < > 119* 109 109  BUN 34*   < > 29*   < > 25*   < > 21 24 22   CREATININE 1.23*   < > 1.70*   < > 1.33*   < > 1.19* 1.18* 1.33*  CALCIUM 9.0   < > 8.6*   < > 8.7*   < > 9.4 9.1 9.2  MG  --   --  1.8  --  1.7  --   --   --   --   TSH 5.35*  --   --   --   --   --   --   --   --    < > = values in this interval not displayed.   Liver Function Tests: Recent Labs    05/01/17 1129  AST 17  ALT 11  ALKPHOS 87  BILITOT 0.6  PROT 6.8  ALBUMIN 3.6   Recent Labs     05/01/17 1119  LIPASE 48  AMYLASE 46   No results for input(s): AMMONIA in the last 8760 hours. CBC: Recent Labs    10/06/17 0954 10/20/17 1200 10/28/17 1105  WBC 5.6 5.6 5.7  NEUTROABS 3,909 3,758 4,520  HGB 11.2* 10.9* 10.6*  HCT 34.5* 32.4* 32.0*  MCV 85.8 85.7 86.5  PLT 271 264 198   Lipid Panel: No results for input(s): CHOL, HDL, LDLCALC, TRIG, CHOLHDL, LDLDIRECT in the last 8760 hours. Lab Results  Component Value Date   HGBA1C 6.2 (H) 05/02/2016    Assessment/Plan 1. Chronic diastolic CHF (congestive heart failure), NYHA class 2 (HCC) -stop scheduled lasix and return to prn lasix as below - furosemide (LASIX) 20 MG tablet; Take 1 tablet (20 mg total) by mouth daily as needed (edema, weight gain of 3 lbs or shortness of breath).  Dispense: 30 tablet; Refill: 3 - Basic metabolic panel  2. Iron deficiency anemia secondary to blood loss (chronic) - cont prenatal vitamin for iron supplement - CBC with Differential/Platelet; Future - Iron, TIBC and Ferritin Panel; Future  3. Essential hypertension -bp elevated today--suspect she did not take her medication this am - Basic metabolic panel  Labs/tests ordered:   Orders Placed This Encounter  Procedures  . CBC with Differential/Platelet    Standing Status:   Future    Standing Expiration Date:   07/21/2018  . Iron, TIBC and Ferritin Panel    Standing Status:   Future    Standing Expiration Date:   07/21/2018  . Basic  metabolic panel    Order Specific Question:   Has the patient fasted?    Answer:   Yes    Next appt:  11/27/2017   Cashawn Yanko L. Dazhane Villagomez, D.O. [facility] [address], [place] [address] Cell Phone (Mon-Fri 8am-5pm):  [phone] On Call:  [phone] & follow prompts after 5pm & weekends Office Phone:  [phone] Office Fax:  [phone]

## 2017-11-27 ENCOUNTER — Ambulatory Visit (INDEPENDENT_AMBULATORY_CARE_PROVIDER_SITE_OTHER): Payer: Medicare Other

## 2017-11-27 ENCOUNTER — Other Ambulatory Visit: Payer: Medicare Other

## 2017-11-27 ENCOUNTER — Telehealth: Payer: Self-pay

## 2017-11-27 DIAGNOSIS — M810 Age-related osteoporosis without current pathological fracture: Secondary | ICD-10-CM

## 2017-11-27 DIAGNOSIS — I10 Essential (primary) hypertension: Secondary | ICD-10-CM | POA: Diagnosis not present

## 2017-11-27 DIAGNOSIS — I5032 Chronic diastolic (congestive) heart failure: Secondary | ICD-10-CM | POA: Diagnosis not present

## 2017-11-27 LAB — BASIC METABOLIC PANEL
BUN/Creatinine Ratio: 19 (calc) (ref 6–22)
BUN: 22 mg/dL (ref 7–25)
CO2: 24 mmol/L (ref 20–32)
Calcium: 9.7 mg/dL (ref 8.6–10.4)
Chloride: 99 mmol/L (ref 98–110)
Creat: 1.16 mg/dL — ABNORMAL HIGH (ref 0.60–0.88)
Glucose, Bld: 111 mg/dL (ref 65–139)
Potassium: 3.7 mmol/L (ref 3.5–5.3)
Sodium: 136 mmol/L (ref 135–146)

## 2017-11-27 MED ORDER — DENOSUMAB 60 MG/ML ~~LOC~~ SOLN
60.0000 mg | Freq: Once | SUBCUTANEOUS | Status: AC
  Administered 2017-11-27: 60 mg via SUBCUTANEOUS

## 2017-11-27 NOTE — Telephone Encounter (Signed)
Noted.  Discontinue.  Will monitor hemoglobin without them.

## 2017-11-27 NOTE — Telephone Encounter (Signed)
Patient was told to take prenatal vitamins  Patient has tried 2 different brands and was unable to tolerate either. Patient with upset stomach while taking vitamin   FYI

## 2017-11-28 ENCOUNTER — Telehealth: Payer: Self-pay

## 2017-11-28 NOTE — Telephone Encounter (Signed)
Patient has tried using 2 different prenatal vitamins but each made her severely nauseous. Her daughter, Brittney Gutierrez, asked for suggestions from [place] as to what to try next.   Janett Billow recommended that patient try Fusion Plus. Samples were placed in sample cabinet at front desk. Brittney Gutierrez was notified and stated she would pick them up. If this medicine works for patient, a prescription will be provided.

## 2017-12-03 ENCOUNTER — Ambulatory Visit (INDEPENDENT_AMBULATORY_CARE_PROVIDER_SITE_OTHER): Payer: Medicare Other | Admitting: Nurse Practitioner

## 2017-12-03 ENCOUNTER — Encounter: Payer: Self-pay | Admitting: Nurse Practitioner

## 2017-12-03 VITALS — BP 180/84 | HR 59 | Temp 97.8°F | Resp 17 | Ht 62.0 in | Wt 126.0 lb

## 2017-12-03 DIAGNOSIS — R0789 Other chest pain: Secondary | ICD-10-CM | POA: Diagnosis not present

## 2017-12-03 DIAGNOSIS — K219 Gastro-esophageal reflux disease without esophagitis: Secondary | ICD-10-CM | POA: Diagnosis not present

## 2017-12-03 DIAGNOSIS — R0602 Shortness of breath: Secondary | ICD-10-CM

## 2017-12-03 DIAGNOSIS — R630 Anorexia: Secondary | ICD-10-CM | POA: Diagnosis not present

## 2017-12-03 DIAGNOSIS — R410 Disorientation, unspecified: Secondary | ICD-10-CM

## 2017-12-03 DIAGNOSIS — R197 Diarrhea, unspecified: Secondary | ICD-10-CM

## 2017-12-03 DIAGNOSIS — D500 Iron deficiency anemia secondary to blood loss (chronic): Secondary | ICD-10-CM

## 2017-12-03 DIAGNOSIS — I10 Essential (primary) hypertension: Secondary | ICD-10-CM

## 2017-12-03 LAB — CBC WITH DIFFERENTIAL/PLATELET
Basophils Absolute: 49 cells/uL (ref 0–200)
Basophils Relative: 0.8 %
Eosinophils Absolute: 153 cells/uL (ref 15–500)
Eosinophils Relative: 2.5 %
HCT: 38.2 % (ref 35.0–45.0)
Hemoglobin: 12.9 g/dL (ref 11.7–15.5)
Lymphs Abs: 683 cells/uL — ABNORMAL LOW (ref 850–3900)
MCH: 29.6 pg (ref 27.0–33.0)
MCHC: 33.8 g/dL (ref 32.0–36.0)
MCV: 87.6 fL (ref 80.0–100.0)
MPV: 11.2 fL (ref 7.5–12.5)
Monocytes Relative: 8.3 %
Neutro Abs: 4709 cells/uL (ref 1500–7800)
Neutrophils Relative %: 77.2 %
Platelets: 267 10*3/uL (ref 140–400)
RBC: 4.36 10*6/uL (ref 3.80–5.10)
RDW: 15 % (ref 11.0–15.0)
Total Lymphocyte: 11.2 %
WBC mixed population: 506 cells/uL (ref 200–950)
WBC: 6.1 10*3/uL (ref 3.8–10.8)

## 2017-12-03 LAB — COMPLETE METABOLIC PANEL WITH GFR
AG Ratio: 1.4 (calc) (ref 1.0–2.5)
ALT: 22 U/L (ref 6–29)
AST: 28 U/L (ref 10–35)
Albumin: 4.1 g/dL (ref 3.6–5.1)
Alkaline phosphatase (APISO): 89 U/L (ref 33–130)
BUN/Creatinine Ratio: 21 (calc) (ref 6–22)
BUN: 21 mg/dL (ref 7–25)
CO2: 23 mmol/L (ref 20–32)
Calcium: 9.1 mg/dL (ref 8.6–10.4)
Chloride: 97 mmol/L — ABNORMAL LOW (ref 98–110)
Creat: 1.01 mg/dL — ABNORMAL HIGH (ref 0.60–0.88)
GFR, Est African American: 58 mL/min/{1.73_m2} — ABNORMAL LOW (ref >=60)
GFR, Est Non African American: 50 mL/min/{1.73_m2} — ABNORMAL LOW (ref >=60)
Globulin: 3 g/dL (calc) (ref 1.9–3.7)
Glucose, Bld: 113 mg/dL — ABNORMAL HIGH (ref 65–99)
Potassium: 3.4 mmol/L — ABNORMAL LOW (ref 3.5–5.3)
Sodium: 134 mmol/L — ABNORMAL LOW (ref 135–146)
Total Bilirubin: 1.1 mg/dL (ref 0.2–1.2)
Total Protein: 7.1 g/dL (ref 6.1–8.1)

## 2017-12-03 LAB — POCT URINALYSIS DIPSTICK
Bilirubin, UA: NEGATIVE
Glucose, UA: NEGATIVE
Ketones, UA: NEGATIVE
LEUKOCYTES UA: NEGATIVE
Nitrite, UA: NEGATIVE
PH UA: 7.5 (ref 5.0–8.0)
Protein, UA: NEGATIVE
RBC UA: NEGATIVE
Spec Grav, UA: 1.01 (ref 1.010–1.025)
UROBILINOGEN UA: 0.2 U/dL

## 2017-12-03 LAB — AMYLASE: Amylase: 36 U/L (ref 21–101)

## 2017-12-03 LAB — LIPASE: Lipase: 39 U/L (ref 7–60)

## 2017-12-03 LAB — TROPONIN I: Troponin I: 0.05 ng/mL (ref <=0.0)

## 2017-12-03 MED ORDER — PANTOPRAZOLE SODIUM 40 MG PO TBEC
40.0000 mg | DELAYED_RELEASE_TABLET | Freq: Every day | ORAL | 3 refills | Status: DC
Start: 2017-12-03 — End: 2018-03-02

## 2017-12-03 MED ORDER — AMLODIPINE BESYLATE 10 MG PO TABS: 10.0000 mg | ORAL_TABLET | Freq: Every day | ORAL | 1 refills | Status: DC

## 2017-12-03 NOTE — Progress Notes (Signed)
Careteam: Patient Care Team: Brittney Curry, DO as PCP - General (Geriatric Medicine) Brittney Klein, MD as Consulting Physician (Cardiology) Brittney Stalls, MD as Consulting Physician (Ophthalmology) Brittney Hopper, MD as Referring Physician (Student)  Advanced Directive information Does Patient Have a Medical Advance Directive?: Yes, Type of Advance Directive: [place];Living will  Allergies  Allergen Reactions  . Bactrim [Sulfamethoxazole-Trimethoprim] Nausea Only  . Amiodarone Itching  . Amoxicillin Other (See Comments)    Unknown- ? Upset stomach  . Demerol [Meperidine] Other (See Comments)    Hallucinations  . Hydralazine Other (See Comments)    Tingling and chest pain   . Lisinopril Cough  . Zoloft [Sertraline Hcl] Other (See Comments)    Unknown  . Tape Rash and Other (See Comments)    Can tear the skin, also    Chief Complaint  Patient presents with  . Acute Visit    Pt is being seen due to shortness of breath for 3 days. Lasix was restarted on Monday with some improvement with SOB. Pt daughter is worried that pt might have UTI due to pt calling her at 3 am this morning looking for the baby in her room.   . Other    Daughter, Brittney Gutierrez, in room     HPI: Patient is a 83 y.o. female seen in the office today due to acute confusion with shortness of breath. Pt with hx of CHF, dementia, anemia, afib, hypertension and others.  Pt reports she feels well this morning. States she slept well last night and has been doing okay. Daughter reports she has not been doing well.  She has been having stomach upset for a while. Hard to describe. Not eating much. 3 days ago she had shortness of breath with some chest pressure and her daughter started lasix back daily since (3 days) shortness of breath has improved but still short of breath with activity, will recover quickly  Have not been weighing at home.  Daughter states she get short of breath with talking-  questions anxiety vs heart.  Points to her chest and states it hurts through here but "does not pay that close attention to it" Denies pain- states it does not feel good but does not feel bad.  Unsure if it is related to eating.   Last night calling daughter in the middle of the night asking where the baby was that kept crying.   Having diarrhea- daughter gives her stool softener twice daily.  Not eating a lot.   Blood pressure has been running high all weekend-  190s/90,192/90  Used to use omeprazole a lot in the past but has been off this for quite some time and did not have recurrent symptoms.   Daughter reports it is hard to figure out exactly what is going on with her because she calls them frequently complaining of things and then will get to office and act like everything is fine. Reports she feels like some of it may be anxiety related.  Review of Systems:  Review of Systems  Constitutional: Positive for malaise/fatigue and weight loss. Negative for chills and fever.  HENT: Positive for hearing loss.   Eyes: Positive for blurred vision.       Glasses  Respiratory: Negative for cough and shortness of breath.   Cardiovascular: Negative for chest pain, palpitations and leg swelling.  Gastrointestinal: Positive for diarrhea. Negative for abdominal pain, heartburn and nausea.       Decrease appetite.  Genitourinary: Negative for dysuria.  Musculoskeletal: Negative for falls.       Unsteady gait  Neurological: Positive for weakness. Negative for dizziness, loss of consciousness and headaches.  Endo/Heme/Allergies: Bruises/bleeds easily.  Psychiatric/Behavioral: Positive for depression and memory loss. The patient is nervous/anxious and has insomnia.    Past Medical History:  Diagnosis Date  . Abnormal weight loss 05/28/2004  . Anxiety 01/17/2003  . Back pain 08/19/2005   with radiculopathy  . Bradycardia, drug induced    Diltiazem  . Cerebral atherosclerosis 09/24/1999  .  Chest pain, atypical 04/2016   minor CAD at cath   . Chronic anticoagulation    Eliquis  . Dementia    "don't know kind or stage" (05/02/2016)  . Depression 09/06/2002  . Diverticulitis 09/28/2007  . External hemorrhoids 04/12/2009  . Female climacteric state 03/04/2000  . 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 GERD (gastroesophageal reflux disease)   . Hypertensive cardiovascular disease    moderate LVH on echo march 2017  . Macular degeneration   . Malaise and fatigue   . 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 Osteoarthrosis, localized   . Osteoporosis 05/28/2004  . Paranoia ([place])   . Paroxysmal A-fib ([place])   . TIA (transient ischemic attack) 2000s?  [name] Urinary incontinence 08/18/2007  . UTI (urinary tract infection)   . Vaginitis, atrophic   . Vertigo, peripheral 10/07/2000   Past Surgical History:  Procedure Laterality Date  . BRAIN SURGERY  2000   Neurocysticercosis ("parasite")  . CARDIAC CATHETERIZATION N/A 05/03/2016   Procedure: Left Heart Cath and Coronary Angiography;  Surgeon: Brittney Mocha, MD;  Location: [place] CV LAB;  Service: Cardiovascular;  Laterality: N/A;  . CARDIOVERSION N/A 07/14/2017   Procedure: CARDIOVERSION;  Surgeon: Brittney Casino, MD;  Location: [facility] ENDOSCOPY;  Service: Cardiovascular;  Laterality: N/A;  . CATARACT EXTRACTION  2013  . CATARACT EXTRACTION, BILATERAL     "not sure if both eyes; feel like it probably was"  . JOINT REPLACEMENT    . PERIPHERAL VASCULAR CATHETERIZATION N/A 05/03/2016   Procedure: Abdominal Aortogram;  Surgeon: Brittney Mocha, MD;  Location: [place] CV LAB;  Service: Cardiovascular;  Laterality: N/A;  . SHOULDER ARTHROSCOPY W/ ROTATOR CUFF REPAIR Right X 3  . TEE WITHOUT CARDIOVERSION N/A 07/14/2017   Procedure: TRANSESOPHAGEAL ECHOCARDIOGRAM (TEE);  Surgeon: Brittney Casino, MD;  Location: [place];  Service: Cardiovascular;  Laterality: N/A;    . TOTAL KNEE ARTHROPLASTY Left 1990  . VAGINAL HYSTERECTOMY  1960   Social History:   reports that  has never smoked. she has never used smokeless tobacco. She reports that she does not drink alcohol or use drugs.  Family History  Problem Relation Age of Onset  . Polycystic kidney disease Mother   . Heart attack Father   . Hypertension Sister   . Dementia Neg Hx     Medications:   Medication List        Accurate as of 12/03/17  9:24 AM. Always use your most recent med list.          acetaminophen 325 MG tablet Commonly known as:  TYLENOL   amLODipine 5 MG tablet Commonly known as:  NORVASC Take 1 tablet (5 mg total) by mouth daily.   apixaban 2.5 MG Tabs tablet Commonly known as:  ELIQUIS Take 1 tablet (2.5 mg total) by mouth 2 (two) times daily.   docusate  sodium 100 MG capsule Commonly known as:  COLACE   donepezil 10 MG tablet Commonly known as:  ARICEPT Take 1 tablet (10 mg total) by mouth daily.   furosemide 20 MG tablet Commonly known as:  LASIX Take 1 tablet (20 mg total) by mouth daily as needed (edema, weight gain of 3 lbs or shortness of breath).   FUSION PLUS Caps   losartan 25 MG tablet Commonly known as:  COZAAR Take 1 tablet (25 mg total) by mouth daily.   metoprolol tartrate 50 MG tablet Commonly known as:  LOPRESSOR   venlafaxine 75 MG tablet Commonly known as:  EFFEXOR Take 1 tablet (75 mg total) 2 (two) times daily by mouth.   Vitamin D3 2000 units Tabs        Physical Exam:  Vitals:   12/03/17 0913  BP: (!) 180/84  Pulse: (!) 59  Resp: 17  Temp: 97.8 F (36.6 C)  TempSrc: Oral  SpO2: 95%  Weight: 126 lb (57.2 kg)  Height: '5\' 2"'  (1.575 m)   Body mass index is 23.05 kg/m.  Physical Exam  Constitutional: She is oriented to person, place, and time. She appears well-developed. No distress.  Cardiovascular: Normal rate, regular rhythm and intact distal pulses.  Pulmonary/Chest: Effort normal and breath sounds normal.  She has no rales.  Abdominal: Soft. Bowel sounds are normal. She exhibits no distension. There is tenderness (superpubic/left lower quad).  Scar tissue noted to left lower quad as well  Musculoskeletal: Normal range of motion.  Unsteady gait, holds onto her daughter rather than using assistive device  Neurological: She is alert and oriented to person, place, and time. She displays normal reflexes. Coordination normal.  Skin: Skin is warm and dry.  Psychiatric: She has a normal mood and affect.    Labs reviewed: Basic Metabolic Panel: Recent Labs    06/27/17 1208  07/11/17 0233  07/17/17 0244  10/28/17 1105 11/05/17 1053 11/27/17 1101  NA 136   < > 131*   < > 133*   < > 134* 135 136  K 4.2   < > 4.1   < > 4.4   < > 3.7 3.5 3.7  CL 103   < > 100*   < > 96*   < > 100 97* 99  CO2 19*   < > 23   < > 24   < > '24 27 24  ' GLUCOSE 104*   < > 113*   < > 94   < > 109 109 111  BUN 34*   < > 29*   < > 25*   < > '24 22 22  ' CREATININE 1.23*   < > 1.70*   < > 1.33*   < > 1.18* 1.33* 1.16*  CALCIUM 9.0   < > 8.6*   < > 8.7*   < > 9.1 9.2 9.7  MG  --   --  1.8  --  1.7  --   --   --   --   TSH 5.35*  --   --   --   --   --   --   --   --    < > = values in this interval not displayed.   Liver Function Tests: Recent Labs    05/01/17 1129  AST 17  ALT 11  ALKPHOS 87  BILITOT 0.6  PROT 6.8  ALBUMIN 3.6   Recent Labs    05/01/17 1119  LIPASE 48  AMYLASE 46   No results for input(s): AMMONIA in the last 8760 hours. CBC: Recent Labs    10/06/17 0954 10/20/17 1200 10/28/17 1105  WBC 5.6 5.6 5.7  NEUTROABS 3,909 3,758 4,520  HGB 11.2* 10.9* 10.6*  HCT 34.5* 32.4* 32.0*  MCV 85.8 85.7 86.5  PLT 271 264 198   Lipid Panel: No results for input(s): CHOL, HDL, LDLCALC, TRIG, CHOLHDL, LDLDIRECT in the last 8760 hours. TSH: Recent Labs    06/27/17 1208  TSH 5.35*   A1C: Lab Results  Component Value Date   HGBA1C 6.2 (H) 05/02/2016     Assessment/Plan 1. Iron deficiency  anemia secondary to blood loss (chronic) -having a hard time tolerating iron which has caused a lot of symptoms, question if maybe this is not related to iron and something else? -labs obtained today. Also with hx of GERD which she was previously on medication.   2. Shortness of breath -ongoing per hx but no shortness of breath in office, lung sounds good, O2 stable. To hold lasix for now while awaiting lab work  3. Essential hypertension -elevated, will increase noravasc at this time. Getting blood work today. If labs stable may need to increase losartan as well as blood pressure has been running high. Daughter will monitor at home and notify if remains elevated over 140/90 - amLODipine (NORVASC) 10 MG tablet; Take 1 tablet (10 mg total) by mouth daily.  Dispense: 90 tablet; Refill: 1  4. Confusion -daughter thought possible due to UTI, UA normal, will send for culture due to hallucinations at night.will also get other labs at this time. - CBC with Differential/Platelets - CMP with eGFR  5. Chest pressure -pt with ongoing chest pressure which has been a complaint in the past, EKG without acute abnormalities, pt in SR with PVC, HR 54.  - Troponin I - EKG 12-Lead  6. Diarrhea, unspecified type -stop stool softener, increase hydration, will get lab work at this time - CMP with eGFR - Amylase - Lipase - H. pylori breath test  7. Decreased appetite -may be related to progressive memory loss, or worsen GERD with GI symptoms.  - H. pylori breath test - pantoprazole (PROTONIX) 40 MG tablet; Take 1 tablet (40 mg total) by mouth daily.  Dispense: 30 tablet; Refill: 3  8. Gastroesophageal reflux disease, esophagitis presence not specified -will restart PPI to see if this improves symptoms  - pantoprazole (PROTONIX) 40 MG tablet; Take 1 tablet (40 mg total) by mouth daily.  Dispense: 30 tablet; Refill: 3  Next appt: 2 weeks on blood pressure Brittney Gutierrez  [facility] [phone] 8 am - 5 pm) [phone] (after hours)

## 2017-12-03 NOTE — Patient Instructions (Addendum)
Increase norvasc 10 mg daily   HOLD lasix for now  Encourage hydration and proper nutrition.

## 2017-12-04 LAB — H. PYLORI BREATH TEST: H. pylori Breath Test: NOT DETECTED

## 2017-12-05 ENCOUNTER — Other Ambulatory Visit: Payer: Self-pay | Admitting: Nurse Practitioner

## 2017-12-05 LAB — URINE CULTURE
MICRO NUMBER: 81448280
SPECIMEN QUALITY: ADEQUATE

## 2017-12-05 MED ORDER — CIPROFLOXACIN HCL 500 MG PO TABS: 500.0000 mg | ORAL_TABLET | Freq: Two times a day (BID) | ORAL | 0 refills | Status: DC

## 2017-12-11 ENCOUNTER — Telehealth: Payer: Self-pay | Admitting: *Deleted

## 2017-12-11 NOTE — Telephone Encounter (Signed)
[name] called back and stated that patient has on and off diarrhea vs constipation. Patient has 1 dose of antibiotic left and is taking with a Probiotic. Daughter feels like it is the antibiotic causing the stomach concerns. Patient is still taking the Pantoprazole and has stopped the stool softner. Caregiver is wondering what she can do or if she should wait till after completing the antibiotic. Patient has a follow up appointment with Janett Billow on 12/22/17. Please Advise.

## 2017-12-11 NOTE — Telephone Encounter (Signed)
[name], caregiver called and left message on Clinical Intake stating that patient is having stomach issues.   I tried calling [name] back and left message for her to return my call with more information. Was going to offer an appointment with Janett Billow in the morning for patient to be evaluated. Awaiting call.

## 2017-12-12 NOTE — Telephone Encounter (Signed)
[name] notified and agreed.

## 2017-12-12 NOTE — Telephone Encounter (Signed)
Let's monitor to see if the diarrhea goes away when antibiotics are complete.  If they persist beyond one week after antibiotics are done, [name] should call us back.

## 2017-12-13 ENCOUNTER — Inpatient Hospital Stay (HOSPITAL_COMMUNITY)
Admission: EM | Admit: 2017-12-13 | Discharge: 2017-12-16 | DRG: 291 | Disposition: A | Discharge status: Home Health | Payer: Medicare Other | Attending: Family Medicine | Admitting: Family Medicine

## 2017-12-13 ENCOUNTER — Emergency Department (HOSPITAL_COMMUNITY): Payer: Medicare Other

## 2017-12-13 ENCOUNTER — Other Ambulatory Visit: Payer: Self-pay

## 2017-12-13 ENCOUNTER — Encounter (HOSPITAL_COMMUNITY): Payer: Self-pay | Admitting: *Deleted

## 2017-12-13 DIAGNOSIS — I119 Hypertensive heart disease without heart failure: Secondary | ICD-10-CM | POA: Diagnosis present

## 2017-12-13 DIAGNOSIS — Z8673 Personal history of transient ischemic attack (TIA), and cerebral infarction without residual deficits: Secondary | ICD-10-CM | POA: Diagnosis not present

## 2017-12-13 DIAGNOSIS — I481 Persistent atrial fibrillation: Secondary | ICD-10-CM | POA: Diagnosis present

## 2017-12-13 DIAGNOSIS — I509 Heart failure, unspecified: Secondary | ICD-10-CM

## 2017-12-13 DIAGNOSIS — Z9841 Cataract extraction status, right eye: Secondary | ICD-10-CM

## 2017-12-13 DIAGNOSIS — H3530 Unspecified macular degeneration: Secondary | ICD-10-CM | POA: Diagnosis present

## 2017-12-13 DIAGNOSIS — I4819 Other persistent atrial fibrillation: Secondary | ICD-10-CM | POA: Diagnosis present

## 2017-12-13 DIAGNOSIS — Z79899 Other long term (current) drug therapy: Secondary | ICD-10-CM | POA: Diagnosis not present

## 2017-12-13 DIAGNOSIS — Z7901 Long term (current) use of anticoagulants: Secondary | ICD-10-CM

## 2017-12-13 DIAGNOSIS — M810 Age-related osteoporosis without current pathological fracture: Secondary | ICD-10-CM | POA: Diagnosis present

## 2017-12-13 DIAGNOSIS — I5033 Acute on chronic diastolic (congestive) heart failure: Secondary | ICD-10-CM | POA: Diagnosis present

## 2017-12-13 DIAGNOSIS — I2510 Atherosclerotic heart disease of native coronary artery without angina pectoris: Secondary | ICD-10-CM | POA: Diagnosis present

## 2017-12-13 DIAGNOSIS — F32A Depression, unspecified: Secondary | ICD-10-CM | POA: Diagnosis present

## 2017-12-13 DIAGNOSIS — I160 Hypertensive urgency: Secondary | ICD-10-CM | POA: Diagnosis present

## 2017-12-13 DIAGNOSIS — Z96652 Presence of left artificial knee joint: Secondary | ICD-10-CM | POA: Diagnosis present

## 2017-12-13 DIAGNOSIS — I110 Hypertensive heart disease with heart failure: Secondary | ICD-10-CM | POA: Diagnosis not present

## 2017-12-13 DIAGNOSIS — Z9842 Cataract extraction status, left eye: Secondary | ICD-10-CM

## 2017-12-13 DIAGNOSIS — I248 Other forms of acute ischemic heart disease: Secondary | ICD-10-CM | POA: Diagnosis present

## 2017-12-13 DIAGNOSIS — N390 Urinary tract infection, site not specified: Secondary | ICD-10-CM | POA: Diagnosis present

## 2017-12-13 DIAGNOSIS — I351 Nonrheumatic aortic (valve) insufficiency: Secondary | ICD-10-CM | POA: Diagnosis present

## 2017-12-13 DIAGNOSIS — J90 Pleural effusion, not elsewhere classified: Secondary | ICD-10-CM | POA: Diagnosis not present

## 2017-12-13 DIAGNOSIS — I672 Cerebral atherosclerosis: Secondary | ICD-10-CM | POA: Diagnosis present

## 2017-12-13 DIAGNOSIS — N183 Chronic kidney disease, stage 3 (moderate): Secondary | ICD-10-CM | POA: Diagnosis present

## 2017-12-13 DIAGNOSIS — I10 Essential (primary) hypertension: Secondary | ICD-10-CM | POA: Diagnosis present

## 2017-12-13 DIAGNOSIS — R609 Edema, unspecified: Secondary | ICD-10-CM | POA: Diagnosis not present

## 2017-12-13 DIAGNOSIS — F0390 Unspecified dementia without behavioral disturbance: Secondary | ICD-10-CM | POA: Diagnosis present

## 2017-12-13 DIAGNOSIS — F329 Major depressive disorder, single episode, unspecified: Secondary | ICD-10-CM | POA: Diagnosis present

## 2017-12-13 DIAGNOSIS — R0602 Shortness of breath: Secondary | ICD-10-CM | POA: Diagnosis not present

## 2017-12-13 DIAGNOSIS — K219 Gastro-esophageal reflux disease without esophagitis: Secondary | ICD-10-CM | POA: Diagnosis present

## 2017-12-13 DIAGNOSIS — I2721 Secondary pulmonary arterial hypertension: Secondary | ICD-10-CM | POA: Diagnosis present

## 2017-12-13 DIAGNOSIS — I130 Hypertensive heart and chronic kidney disease with heart failure and stage 1 through stage 4 chronic kidney disease, or unspecified chronic kidney disease: Principal | ICD-10-CM | POA: Diagnosis present

## 2017-12-13 DIAGNOSIS — J9601 Acute respiratory failure with hypoxia: Secondary | ICD-10-CM

## 2017-12-13 DIAGNOSIS — F419 Anxiety disorder, unspecified: Secondary | ICD-10-CM | POA: Diagnosis present

## 2017-12-13 HISTORY — DX: Heart failure, unspecified: I50.9

## 2017-12-13 LAB — I-STAT ARTERIAL BLOOD GAS, ED
Acid-base deficit: 1 mmol/L (ref 0.0–2.0)
Bicarbonate: 22.6 mmol/L (ref 20.0–28.0)
O2 Saturation: 88 %
TCO2: 24 mmol/L (ref 22–32)
pCO2 arterial: 34.5 mmHg (ref 32.0–48.0)
pH, Arterial: 7.423 (ref 7.350–7.450)
pO2, Arterial: 52 mmHg — ABNORMAL LOW (ref 83.0–108.0)

## 2017-12-13 LAB — BASIC METABOLIC PANEL
Anion gap: 8 (ref 5–15)
BUN: 24 mg/dL — AB (ref 6–20)
CO2: 22 mmol/L (ref 22–32)
CREATININE: 1.37 mg/dL — AB (ref 0.44–1.00)
Calcium: 9.3 mg/dL (ref 8.9–10.3)
Chloride: 100 mmol/L — ABNORMAL LOW (ref 101–111)
GFR calc Af Amer: 39 mL/min — ABNORMAL LOW (ref >60)
GFR, EST NON AFRICAN AMERICAN: 33 mL/min — AB (ref >60)
Glucose, Bld: 132 mg/dL — ABNORMAL HIGH (ref 65–99)
POTASSIUM: 4.5 mmol/L (ref 3.5–5.1)
SODIUM: 130 mmol/L — AB (ref 135–145)

## 2017-12-13 LAB — CBC
HEMATOCRIT: 37.1 % (ref 36.0–46.0)
Hemoglobin: 11.8 g/dL — ABNORMAL LOW (ref 12.0–15.0)
MCH: 29 pg (ref 26.0–34.0)
MCHC: 31.8 g/dL (ref 30.0–36.0)
MCV: 91.2 fL (ref 78.0–100.0)
Platelets: 296 10*3/uL (ref 150–400)
RBC: 4.07 MIL/uL (ref 3.87–5.11)
RDW: 15.6 % — AB (ref 11.5–15.5)
WBC: 6.3 10*3/uL (ref 4.0–10.5)

## 2017-12-13 LAB — I-STAT TROPONIN, ED: TROPONIN I, POC: 0.02 ng/mL (ref 0.00–0.08)

## 2017-12-13 LAB — TSH: TSH: 8.725 u[IU]/mL — ABNORMAL HIGH (ref 0.350–4.500)

## 2017-12-13 LAB — PROCALCITONIN: Procalcitonin: <0.1 ng/mL

## 2017-12-13 LAB — LACTIC ACID, PLASMA: LACTIC ACID, VENOUS: 1.2 mmol/L (ref 0.5–1.9)

## 2017-12-13 LAB — BRAIN NATRIURETIC PEPTIDE: B NATRIURETIC PEPTIDE 5: 1190.9 pg/mL — AB (ref 0.0–100.0)

## 2017-12-13 LAB — D-DIMER, QUANTITATIVE (NOT AT ARMC): D DIMER QUANT: 1.62 ug{FEU}/mL — AB (ref 0.00–0.50)

## 2017-12-13 LAB — TROPONIN I: TROPONIN I: 0.03 ng/mL — AB (ref <0.03)

## 2017-12-13 MED ORDER — APIXABAN 2.5 MG PO TABS
2.5000 mg | ORAL_TABLET | Freq: Two times a day (BID) | ORAL | Status: DC
  Administered 2017-12-13 – 2017-12-16 (×6): 2.5 mg via ORAL
  Filled 2017-12-13 (×6): qty 1

## 2017-12-13 MED ORDER — VENLAFAXINE HCL 75 MG PO TABS
75.0000 mg | ORAL_TABLET | Freq: Two times a day (BID) | ORAL | Status: DC
  Administered 2017-12-13 – 2017-12-16 (×7): 75 mg via ORAL
  Filled 2017-12-13 (×9): qty 1

## 2017-12-13 MED ORDER — FUROSEMIDE 10 MG/ML IJ SOLN
20.0000 mg | Freq: Once | INTRAMUSCULAR | Status: AC
  Administered 2017-12-13: 20 mg via INTRAVENOUS
  Filled 2017-12-13: qty 2

## 2017-12-13 MED ORDER — FUROSEMIDE 10 MG/ML IJ SOLN: 20.0000 mg | Freq: Once | INTRAMUSCULAR | Status: DC

## 2017-12-13 MED ORDER — LOSARTAN POTASSIUM 25 MG PO TABS
25.0000 mg | ORAL_TABLET | Freq: Every day | ORAL | Status: DC
  Administered 2017-12-14 – 2017-12-16 (×3): 25 mg via ORAL
  Filled 2017-12-13 (×3): qty 1

## 2017-12-13 MED ORDER — AMLODIPINE BESYLATE 10 MG PO TABS
10.0000 mg | ORAL_TABLET | Freq: Every day | ORAL | Status: DC
  Administered 2017-12-13 – 2017-12-16 (×3): 10 mg via ORAL
  Filled 2017-12-13: qty 2
  Filled 2017-12-13: qty 1
  Filled 2017-12-13: qty 2

## 2017-12-13 MED ORDER — PANTOPRAZOLE SODIUM 40 MG PO TBEC
40.0000 mg | DELAYED_RELEASE_TABLET | Freq: Every day | ORAL | Status: DC
  Administered 2017-12-13 – 2017-12-16 (×4): 40 mg via ORAL
  Filled 2017-12-13 (×3): qty 1

## 2017-12-13 MED ORDER — SODIUM CHLORIDE 0.9% FLUSH
3.0000 mL | Freq: Two times a day (BID) | INTRAVENOUS | Status: DC
  Administered 2017-12-13 – 2017-12-16 (×6): 3 mL via INTRAVENOUS

## 2017-12-13 MED ORDER — METOPROLOL TARTRATE 50 MG PO TABS
50.0000 mg | ORAL_TABLET | Freq: Two times a day (BID) | ORAL | Status: DC
  Administered 2017-12-13 – 2017-12-16 (×5): 50 mg via ORAL
  Filled 2017-12-13: qty 2
  Filled 2017-12-13: qty 1
  Filled 2017-12-13 (×2): qty 2
  Filled 2017-12-13: qty 1
  Filled 2017-12-13: qty 2

## 2017-12-13 MED ORDER — SODIUM CHLORIDE 0.9% FLUSH: 3.0000 mL | INTRAVENOUS | Status: DC | PRN

## 2017-12-13 MED ORDER — ACETAMINOPHEN 325 MG PO TABS
650.0000 mg | ORAL_TABLET | ORAL | Status: DC | PRN
  Administered 2017-12-15 (×2): 650 mg via ORAL
  Filled 2017-12-13 (×2): qty 2

## 2017-12-13 MED ORDER — ONDANSETRON HCL 4 MG/2ML IJ SOLN: 4.0000 mg | Freq: Four times a day (QID) | INTRAMUSCULAR | Status: DC | PRN

## 2017-12-13 MED ORDER — ISOSORBIDE MONONITRATE ER 30 MG PO TB24
30.0000 mg | ORAL_TABLET | Freq: Every day | ORAL | Status: DC
  Administered 2017-12-13 – 2017-12-16 (×4): 30 mg via ORAL
  Filled 2017-12-13 (×4): qty 1

## 2017-12-13 MED ORDER — FUROSEMIDE 10 MG/ML IJ SOLN
40.0000 mg | Freq: Two times a day (BID) | INTRAMUSCULAR | Status: DC
  Administered 2017-12-14 – 2017-12-15 (×3): 40 mg via INTRAVENOUS
  Filled 2017-12-13 (×3): qty 4

## 2017-12-13 MED ORDER — SODIUM CHLORIDE 0.9 % IV SOLN: 250.0000 mL | INTRAVENOUS | Status: DC | PRN

## 2017-12-13 MED ORDER — POTASSIUM CHLORIDE CRYS ER 20 MEQ PO TBCR
20.0000 meq | EXTENDED_RELEASE_TABLET | Freq: Two times a day (BID) | ORAL | Status: DC
  Administered 2017-12-13 – 2017-12-15 (×4): 20 meq via ORAL
  Filled 2017-12-13 (×4): qty 1

## 2017-12-13 NOTE — ED Notes (Signed)
Family at bedside. 

## 2017-12-13 NOTE — ED Notes (Signed)
Radiology informed to transport pt too treatment room.

## 2017-12-13 NOTE — H&P (Addendum)
[facility] History and Physical   Patient: Brittney Gutierrez WCH:[id number]   PCP: Gayland Curry, DO DOB: [birth year]   DOA: 12/13/2017   DOS: 12/13/2017   DOS: the patient was seen and examined on 12/13/2017  Patient coming from: The patient is coming from ALF.  Chief Complaint: shortnes of breath  HPI: Brittney Gutierrez is a 84 y.o. female with Past medical history of chronic diastolic CHF, essential hypertension, chronic A. fib, GERD, depression, aortic insufficiency. Patient presents with complaints of shortness of breath ongoing since Friday.  Denies any chest pain or chest heaviness but having some funny sensation in her chest.  No nausea no vomiting.  No fever no chills.  No runny nose no cough no orthopnea no PND. Shortness of breath is primarily on exertion.  Patient and family has also noted swelling of her legs. Recently seen in PCPs office due to high blood pressure and her amlodipine dose was increased.  She is on Lasix as needed but has not taken it recently.  Patient was also recently given ciprofloxacin for UTI.  No other change in medication. She feels fine at the time of my evaluation the rest but visibly short of breath.  ED Course: Patient was given IV Lasix 20 mg x1.  Her weight was 127 pound.  On my evaluation patient desaturated requiring 4 L of oxygen to maintain adequate saturation.  At her baseline ambulates with support And is independent for most of her ADL; manages her medication on her own.  Review of Systems: as mentioned in the history of present illness.  All other systems reviewed and are negative.  Past Medical History:  Diagnosis Date  . Abnormal weight loss 05/28/2004  . Anxiety 01/17/2003  . Back pain 08/19/2005   with radiculopathy  . Bradycardia, drug induced    Diltiazem  . Cerebral atherosclerosis 09/24/1999  . Chest pain, atypical 04/2016   minor CAD at cath   . CHF (congestive heart failure) ([place])   . Chronic anticoagulation    Eliquis    . Dementia    "don't know kind or stage" (05/02/2016)  . Depression 09/06/2002  . Diverticulitis 09/28/2007  . External hemorrhoids 04/12/2009  . Female climacteric state 03/04/2000  . 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 GERD (gastroesophageal reflux disease)   . Hypertensive cardiovascular disease    moderate LVH on echo march 2017  . Macular degeneration   . Malaise and fatigue   . Neurocysticercosis    Craniotomy at [facility] in early 2000s  . Osteoarthrosis, localized   . Osteoporosis 05/28/2004  . Paranoia ([place])   . Paroxysmal A-fib ([place])   . TIA (transient ischemic attack) 2000s?  [name] Urinary incontinence 08/18/2007  . UTI (urinary tract infection)   . Vaginitis, atrophic   . Vertigo, peripheral 10/07/2000   Past Surgical History:  Procedure Laterality Date  . BRAIN SURGERY  2000   Neurocysticercosis ("parasite")  . CARDIAC CATHETERIZATION N/A 05/03/2016   Procedure: Left Heart Cath and Coronary Angiography;  Surgeon: Sherren Mocha, MD;  Location: [place] CV LAB;  Service: Cardiovascular;  Laterality: N/A;  . CARDIOVERSION N/A 07/14/2017   Procedure: CARDIOVERSION;  Surgeon: Pixie Casino, MD;  Location: [facility] ENDOSCOPY;  Service: Cardiovascular;  Laterality: N/A;  . CATARACT EXTRACTION  2013  . CATARACT EXTRACTION, BILATERAL     "not sure if both eyes; feel like it probably was"  . JOINT REPLACEMENT    . PERIPHERAL  VASCULAR CATHETERIZATION N/A 05/03/2016   Procedure: Abdominal Aortogram;  Surgeon: Sherren Mocha, MD;  Location: [place] CV LAB;  Service: Cardiovascular;  Laterality: N/A;  . SHOULDER ARTHROSCOPY W/ ROTATOR CUFF REPAIR Right X 3  . TEE WITHOUT CARDIOVERSION N/A 07/14/2017   Procedure: TRANSESOPHAGEAL ECHOCARDIOGRAM (TEE);  Surgeon: Pixie Casino, MD;  Location: [place];  Service: Cardiovascular;  Laterality: N/A;  . TOTAL KNEE ARTHROPLASTY Left 1990  . VAGINAL HYSTERECTOMY  1960   Social History:   reports that  has never smoked. she has never used smokeless tobacco. She reports that she does not drink alcohol or use drugs.  Allergies  Allergen Reactions  . Bactrim [Sulfamethoxazole-Trimethoprim] Nausea Only  . Amiodarone Itching  . Amoxicillin Other (See Comments)    Unknown- ? Upset stomach  . Demerol [Meperidine] Other (See Comments)    Hallucinations  . Hydralazine Other (See Comments)    Tingling and chest pain   . Lisinopril Cough  . Zoloft [Sertraline Hcl] Other (See Comments)    Unknown  . Tape Rash and Other (See Comments)    Can tear the skin, also     Family History  Problem Relation Age of Onset  . Polycystic kidney disease Mother   . Heart attack Father   . Hypertension Sister   . Dementia Neg Hx      Prior to Admission medications   Medication Sig Start Date End Date Taking? Authorizing Provider  acetaminophen (TYLENOL) 325 MG tablet Take 650 mg by mouth every 6 (six) hours as needed for mild pain.    [provider]  amLODipine (NORVASC) 10 MG tablet Take 1 tablet (10 mg total) by mouth daily. 12/03/17   Lauree Chandler, NP  apixaban (ELIQUIS) 2.5 MG TABS tablet Take 1 tablet (2.5 mg total) by mouth 2 (two) times daily. 05/01/17   Croitoru, Mihai, MD  Cholecalciferol (VITAMIN D3) 2000 units TABS Take 2,000 Units by mouth daily.     [provider]  docusate sodium (COLACE) 100 MG capsule Take 100 mg by mouth 2 (two) times daily.     [provider]  donepezil (ARICEPT) 10 MG tablet Take 1 tablet (10 mg total) by mouth daily. 10/10/17   Reed, Tiffany L, DO  furosemide (LASIX) 20 MG tablet Take 1 tablet (20 mg total) by mouth daily as needed (edema, weight gain of 3 lbs or shortness of breath). 11/20/17   Reed, Tiffany L, DO  Iron-FA-B Cmp-C-Biot-Probiotic (FUSION PLUS) CAPS Take 1 tablet by mouth daily.    [provider]  losartan (COZAAR) 25 MG tablet Take 1 tablet (25 mg total) by mouth daily. 07/18/17   Eileen Stanford, PA-C  metoprolol tartrate (LOPRESSOR) 50 MG tablet Take 50 mg by mouth 2 (two) times daily.    [provider]  pantoprazole (PROTONIX) 40 MG tablet Take 1 tablet (40 mg total) by mouth daily. 12/03/17   Lauree Chandler, NP  venlafaxine (EFFEXOR) 75 MG tablet Take 1 tablet (75 mg total) 2 (two) times daily by mouth. 10/20/17   Gayland Curry, DO    Physical Exam: Vitals:   12/13/17 1601 12/13/17 1602 12/13/17 1608 12/13/17 1649  BP: (!) 161/47   (!) 180/55  Pulse: (!) 51   (!) 53  Resp: 20   (!) 35  Temp: (!) 97.5 F (36.4 C)     TempSrc: Oral     SpO2: (!) 89%  98% 92%  Weight:  57.2 kg (126 lb)  Height:  5\' 2"  (1.575 m)      General: Alert, Awake and Oriented to Time, Place and Person. Appear in marked distress, affect appropriate Eyes: PERRL, Conjunctiva normal ENT: Oral Mucosa clear moist. Neck: positive JVD, no Abnormal Mass Or lumps Cardiovascular: S1 and S2 Present, aortic systolic Murmur, Peripheral Pulses Present Respiratory: increased respiratory effort, Bilateral Air entry equal and Decreased, positive use of accessory muscle, bilateral basal Crackles, no wheezes Abdomen: Bowel Sound present, Soft and no tenderness, no hernia Skin: no redness, no Rash, no induration Extremities: bilateral Pedal edema, right more than left,no calf tenderness Neurologic: Grossly no focal neuro deficit. Bilaterally Equal motor strength  Labs on Admission:  CBC: Recent Labs  Lab 12/13/17 1608  WBC 6.3  HGB 11.8*  HCT 37.1  MCV 91.2  PLT 160   Basic Metabolic Panel: Recent Labs  Lab 12/13/17 1608  NA 130*  K 4.5  CL 100*  CO2 22  GLUCOSE 132*  BUN 24*  CREATININE 1.37*  CALCIUM 9.3   GFR: Estimated Creatinine Clearance: 22.4 mL/min (A) (by C-G formula based on SCr of 1.37 mg/dL (H)). Liver Function Tests: No results for input(s): AST, ALT, ALKPHOS, BILITOT, PROT, ALBUMIN in the last 168 hours. No results for input(s): LIPASE, AMYLASE in the  last 168 hours. No results for input(s): AMMONIA in the last 168 hours. Coagulation Profile: No results for input(s): INR, PROTIME in the last 168 hours. Cardiac Enzymes: No results for input(s): CKTOTAL, CKMB, CKMBINDEX, TROPONINI in the last 168 hours. BNP (last 3 results) No results for input(s): PROBNP in the last 8760 hours. HbA1C: No results for input(s): HGBA1C in the last 72 hours. CBG: No results for input(s): GLUCAP in the last 168 hours. Lipid Profile: No results for input(s): CHOL, HDL, LDLCALC, TRIG, CHOLHDL, LDLDIRECT in the last 72 hours. Thyroid Function Tests: No results for input(s): TSH, T4TOTAL, FREET4, T3FREE, THYROIDAB in the last 72 hours. Anemia Panel: No results for input(s): VITAMINB12, FOLATE, FERRITIN, TIBC, IRON, RETICCTPCT in the last 72 hours. Urine analysis:    Component Value Date/Time   COLORURINE STRAW (A) 07/09/2017 1824   APPEARANCEUR CLEAR 07/09/2017 1824   LABSPEC 1.008 07/09/2017 1824   PHURINE 7.0 07/09/2017 1824   GLUCOSEU NEGATIVE 07/09/2017 1824   HGBUR NEGATIVE 07/09/2017 1824   BILIRUBINUR neg 12/03/2017 1113   KETONESUR NEGATIVE 07/09/2017 1824   PROTEINUR neg 12/03/2017 1113   PROTEINUR NEGATIVE 07/09/2017 1824   UROBILINOGEN 0.2 12/03/2017 1113   NITRITE neg 12/03/2017 1113   NITRITE NEGATIVE 07/09/2017 1824   LEUKOCYTESUR Negative 12/03/2017 1113    Radiological Exams on Admission: Dg Chest 2 View  Result Date: 12/13/2017 CLINICAL DATA:  Pt here due to increasing SOB and BLE swelling since yesterday. Recently finished antibiotics for UTI; Hx of CHF and TIA; non-smoker EXAM: CHEST  2 VIEW FINDINGS: The heart is enlarged. There is perihilar peribronchial thickening. There is dense opacity at the left lung base obscuring the hemidiaphragm, and compatible with infiltrate or atelectasis and pleural effusion. Small right pleural effusion. There is increased interstitial markings which may reflect mild edema. IMPRESSION: 1.  Cardiomegaly and increased interstitial markings in bilateral pleural effusions, favoring increased edema. 2. Increased opacity in the left lung base consistent with infiltrate/atelectasis. Electronically Signed   By: Nolon Nations M.D.   On: 12/13/2017 17:04   EKG: Independently reviewed. normal sinus rhythm, nonspecific ST and T waves changes.  Assessment/Plan 1. Acute on chronic diastolic CHF (congestive heart failure) (HCC) Hypertensive urgency. With  complaints of shortness of breath and dyspnea on exertion.  Chest x-ray shows vascular congestion, BNP elevated, hypertension uncontrolled. Most likely this presentation is consistent with acute on chronic diastolic CHF. Last echocardiogram in 2018 shows preserved EF no significant valvular or wall motion abnormalities. Patient received 20 mg IV Lasix in the ER, will give 20 mg x1 more. Starting on IV 40 mg twice daily. Continue amlodipine, losartan and metoprolol. Add Imdur Allergic to hydralazine. May require IV nitroglycerin for blood pressure is not well controlled. Daily weight and in and out. Cardiac diet. Monitor daily BMP, if renal function worsens further may require cardiology input and/or echocardiogram.  2.  Acute hypoxic respiratory failure. Does not use oxygen at her baseline currently on 4 L of oxygen barely maintaining 88% saturation. Check ABG. Although presentation is consistent with acute CHF possibility of other etiology cannot be ruled out. Check pro calcitonin level, d-dimer. Lower extremity DVT Doppler as well. Patient is already on anticoagulation therefore possibility of PE is less likely.  3.  Persistent A. fib S/P cardioversion in August 2018. On Eliquis.CHADS VASc=6 Currently in sinus rhythm and rate controlled. Continue beta-blocker. Continue anti-coagulation as well.  Dose adjusted based on renal function.  4.  Chronic kidney disease stage III. Baseline serum creatinine 1.2. Currently 1.3 on  admission. Monitor daily. Avoid nephrotoxic medication.  5.  Dementia. Patient does have some delirium during past admission. Monitor.  6.  Depression. Continue venlafaxine.  7.  GERD. Continue PPI.  8.  Generalized fatigue and deconditioning. PT OT consulted.  Nutrition: cardiac diet DVT Prophylaxis: mechanical compression device And on therapeutic anticoagulation.  Advance goals of care discussion: partial only bipap, daughters were at bedside, discussed in detail.   Consults: none  Family Communication: family was present at bedside, at the time of interview.  Opportunity was given to ask question and all questions were answered satisfactorily.  Disposition: Admitted as inpatient step-down unit. Likely to be discharged SNF, in 3-4 days.  Author: Berle Mull, MD [facility] Pager: [phone] 12/13/2017  If 7PM-7AM, please contact night-coverage [email or web address] Password [email or web address]

## 2017-12-13 NOTE — ED Notes (Signed)
CRITICAL VALUE ALERT  Critical Value:  Troponin 0.03  Date & Time Notied:  1920  Provider Notified: Dr. Rex Kras  Orders Received/Actions taken: no orders given

## 2017-12-13 NOTE — ED Triage Notes (Addendum)
PT brought here from [facility] for increasing sob and bil LE swelling since yesterday.  REcently finished antibiotics for UTI.

## 2017-12-13 NOTE — ED Provider Notes (Signed)
[place] EMERGENCY DEPARTMENT Provider Note   CSN: [id number] Arrival date & time: 12/13/17  1542     History   Chief Complaint Chief Complaint  Patient presents with  . Shortness of Breath    HPI Brittney Gutierrez is a 84 y.o. female.  84 year old female with past medical history including CHF, CAD, atrial fibrillation on anticoagulation, TIA who presents with shortness of breath and leg swelling.  The patient has had worsening shortness of breath over the past few days and over the past 2-3 days she has had worsening bilateral lower extremity edema.  Daughters report that she has had decreased energy, laying around for the most of the day.  She denies any significant cough or cold symptoms and no fevers.  She reports difficulty sleeping.  She has had no associated chest pain, vomiting, diarrhea, or urinary symptoms.  She recently finished a course of ciprofloxacin for a UTI, denies any urinary symptoms.  Daughters state that they have been treating mild diarrhea related to the antibiotics.  She is prescribed Lasix to take as needed but has not been taking it recently.   The history is provided by the patient.  Shortness of Breath     Past Medical History:  Diagnosis Date  . Abnormal weight loss 05/28/2004  . Anxiety 01/17/2003  . Back pain 08/19/2005   with radiculopathy  . Bradycardia, drug induced    Diltiazem  . Cerebral atherosclerosis 09/24/1999  . Chest pain, atypical 04/2016   minor CAD at cath   . CHF (congestive heart failure) ([place])   . Chronic anticoagulation    Eliquis  . Dementia    "don't know kind or stage" (05/02/2016)  . Depression 09/06/2002  . Diverticulitis 09/28/2007  . External hemorrhoids 04/12/2009  . Female climacteric state 03/04/2000  . 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 GERD (gastroesophageal reflux disease)   . Hypertensive cardiovascular disease    moderate LVH on echo  march 2017  . Macular degeneration   . Malaise and fatigue   . Neurocysticercosis    Craniotomy at [facility] in early 2000s  . Osteoarthrosis, localized   . Osteoporosis 05/28/2004  . Paranoia ([place])   . Paroxysmal A-fib ([place])   . TIA (transient ischemic attack) 2000s?  [name] Urinary incontinence 08/18/2007  . UTI (urinary tract infection)   . Vaginitis, atrophic   . Vertigo, peripheral 10/07/2000    Patient Active Problem List   Diagnosis Date Noted  . External hemorrhoids 10/20/2017  . Diverticulosis of large intestine without hemorrhage 10/20/2017  . Iron deficiency anemia secondary to blood loss (chronic) 10/20/2017  . Anxiety and depression 10/20/2017  . Chronic diastolic heart failure ([place]) 07/29/2017  . Expressive aphasia   . Near syncope 07/09/2017  . Hypertensive cardiovascular disease   . Chronic anticoagulation   . Polypharmacy 05/03/2017  . DDD (degenerative disc disease), lumbar 03/24/2017  . Left hip pain 03/24/2017  . Current use of long term anticoagulation 06/16/2016  . Chest pain 05/02/2016  . GERD (gastroesophageal reflux disease) 04/18/2016  . Depression 04/18/2016  . SSS (sick sinus syndrome) ([place]) 04/11/2016  . Chest pain, atypical 04/08/2016  . Aortic insufficiency 01/14/2016  . PAH (pulmonary artery hypertension) ([place]) 01/14/2016  . Essential hypertension 01/14/2016  . Persistent atrial fibrillation ([place]) 12/27/2015  . Dementia 12/27/2015  . Hypokalemia 12/27/2015  . Demand ischemia [facility])     Past Surgical History:  Procedure Laterality Date  .  BRAIN SURGERY  2000   Neurocysticercosis ("parasite")  . CARDIAC CATHETERIZATION N/A 05/03/2016   Procedure: Left Heart Cath and Coronary Angiography;  Surgeon: Sherren Mocha, MD;  Location: [place] CV LAB;  Service: Cardiovascular;  Laterality: N/A;  . CARDIOVERSION N/A 07/14/2017   Procedure: CARDIOVERSION;  Surgeon: Pixie Casino, MD;  Location: [facility] ENDOSCOPY;  Service: Cardiovascular;  Laterality: N/A;    . CATARACT EXTRACTION  2013  . CATARACT EXTRACTION, BILATERAL     "not sure if both eyes; feel like it probably was"  . JOINT REPLACEMENT    . PERIPHERAL VASCULAR CATHETERIZATION N/A 05/03/2016   Procedure: Abdominal Aortogram;  Surgeon: Sherren Mocha, MD;  Location: [place] CV LAB;  Service: Cardiovascular;  Laterality: N/A;  . SHOULDER ARTHROSCOPY W/ ROTATOR CUFF REPAIR Right X 3  . TEE WITHOUT CARDIOVERSION N/A 07/14/2017   Procedure: TRANSESOPHAGEAL ECHOCARDIOGRAM (TEE);  Surgeon: Pixie Casino, MD;  Location: [place];  Service: Cardiovascular;  Laterality: N/A;  . TOTAL KNEE ARTHROPLASTY Left 1990  . VAGINAL HYSTERECTOMY  1960    OB History    No data available       Home Medications    Prior to Admission medications   Medication Sig Start Date End Date Taking? Authorizing Provider  acetaminophen (TYLENOL) 325 MG tablet Take 650 mg by mouth every 6 (six) hours as needed for mild pain.    [provider]  amLODipine (NORVASC) 10 MG tablet Take 1 tablet (10 mg total) by mouth daily. 12/03/17   Lauree Chandler, NP  apixaban (ELIQUIS) 2.5 MG TABS tablet Take 1 tablet (2.5 mg total) by mouth 2 (two) times daily. 05/01/17   Croitoru, Mihai, MD  Cholecalciferol (VITAMIN D3) 2000 units TABS Take 2,000 Units by mouth daily.     [provider]  ciprofloxacin (CIPRO) 500 MG tablet Take 1 tablet (500 mg total) by mouth 2 (two) times daily. 12/05/17   Lauree Chandler, NP  docusate sodium (COLACE) 100 MG capsule Take 100 mg by mouth 2 (two) times daily.     [provider]  donepezil (ARICEPT) 10 MG tablet Take 1 tablet (10 mg total) by mouth daily. 10/10/17   Reed, Tiffany L, DO  furosemide (LASIX) 20 MG tablet Take 1 tablet (20 mg total) by mouth daily as needed (edema, weight gain of 3 lbs or shortness of breath). 11/20/17   Reed, Tiffany L, DO  Iron-FA-B Cmp-C-Biot-Probiotic (FUSION PLUS) CAPS Take 1 tablet by mouth daily.    [provider]  losartan (COZAAR) 25 MG tablet Take 1 tablet (25 mg total) by mouth daily. 07/18/17   Eileen Stanford, PA-C  metoprolol tartrate (LOPRESSOR) 50 MG tablet Take 50 mg by mouth 2 (two) times daily.    [provider]  pantoprazole (PROTONIX) 40 MG tablet Take 1 tablet (40 mg total) by mouth daily. 12/03/17   Lauree Chandler, NP  venlafaxine (EFFEXOR) 75 MG tablet Take 1 tablet (75 mg total) 2 (two) times daily by mouth. 10/20/17   Gayland Curry, DO    Family History Family History  Problem Relation Age of Onset  . Polycystic kidney disease Mother   . Heart attack Father   . Hypertension Sister   . Dementia Neg Hx     Social History Social History   Tobacco Use  . Smoking status: Never Smoker  . Smokeless tobacco: Never Used  Substance Use Topics  . Alcohol use: No    Alcohol/week: 0.0 oz  .  Drug use: No     Allergies   Bactrim [sulfamethoxazole-trimethoprim]; Amiodarone; Amoxicillin; Demerol [meperidine]; Hydralazine; Lisinopril; Zoloft [sertraline hcl]; and Tape   Review of Systems Review of Systems  Respiratory: Positive for shortness of breath.    All other systems reviewed and are negative except that which was mentioned in HPI   Physical Exam Updated Vital Signs BP (!) 180/55   Pulse (!) 53   Temp (!) 97.5 F (36.4 C) (Oral)   Resp (!) 35   Ht 5\' 2"  (1.575 m)   Wt 57.2 kg (126 lb)   SpO2 92%   BMI 23.05 kg/m   Physical Exam  Constitutional: She is oriented to person, place, and time. She appears well-developed and well-nourished. No distress.  HENT:  Head: Normocephalic and atraumatic.  Moist mucous membranes  Eyes: Conjunctivae are normal.  Neck: Neck supple.  Cardiovascular: Normal rate and regular rhythm.  Murmur heard. Pulmonary/Chest: No respiratory distress. She has no wheezes.  Mildly increased WOB without distress, severely diminished BS b/l bases  Abdominal: Soft. Bowel sounds are normal. She exhibits no distension. There  is no tenderness.  Musculoskeletal:       Right lower leg: She exhibits edema.       Left lower leg: She exhibits edema.  Neurological: She is alert and oriented to person, place, and time.  Fluent speech  Skin: Skin is warm and dry.  Psychiatric: She has a normal mood and affect. Judgment normal.  Nursing note and vitals reviewed.    ED Treatments / Results  Labs (all labs ordered are listed, but only abnormal results are displayed) Labs Reviewed  BASIC METABOLIC PANEL - Abnormal; Notable for the following components:      Result Value   Sodium 130 (*)    Chloride 100 (*)    Glucose, Bld 132 (*)    BUN 24 (*)    Creatinine, Ser 1.37 (*)    GFR calc non Af Amer 33 (*)    GFR calc Af Amer 39 (*)    All other components within normal limits  CBC - Abnormal; Notable for the following components:   Hemoglobin 11.8 (*)    RDW 15.6 (*)    All other components within normal limits  BRAIN NATRIURETIC PEPTIDE - Abnormal; Notable for the following components:   B Natriuretic Peptide 1,190.9 (*)    All other components within normal limits  I-STAT TROPONIN, ED    EKG  EKG Interpretation  Date/Time:  Saturday December 13 2017 15:48:33 EST Ventricular Rate:  53 PR Interval:  148 QRS Duration: 98 QT Interval:  444 QTC Calculation: 416 R Axis:   -48 Text Interpretation:  Sinus bradycardia Left axis deviation Incomplete right bundle branch block Moderate voltage criteria for LVH, may be normal variant ST & T wave abnormality, consider lateral ischemia Abnormal ECG since previous tracing, PVCs improved, T wave inversion aVL Confirmed by Theotis Burrow [id number]) on 12/13/2017 4:51:45 PM       Radiology Dg Chest 2 View  Result Date: 12/13/2017 CLINICAL DATA:  Pt here due to increasing SOB and BLE swelling since yesterday. Recently finished antibiotics for UTI; Hx of CHF and TIA; non-smoker EXAM: CHEST  2 VIEW FINDINGS: The heart is enlarged. There is perihilar peribronchial thickening.  There is dense opacity at the left lung base obscuring the hemidiaphragm, and compatible with infiltrate or atelectasis and pleural effusion. Small right pleural effusion. There is increased interstitial markings which may reflect mild edema. IMPRESSION: 1. Cardiomegaly  and increased interstitial markings in bilateral pleural effusions, favoring increased edema. 2. Increased opacity in the left lung base consistent with infiltrate/atelectasis. Electronically Signed   By: Nolon Nations M.D.   On: 12/13/2017 17:04    Procedures Procedures (including critical care time)  Medications Ordered in ED Medications  furosemide (LASIX) injection 20 mg (20 mg Intravenous Given 12/13/17 1740)     Initial Impression / Assessment and Plan / ED Course  I have reviewed the triage vital signs and the nursing notes.  Pertinent labs & imaging results that were available during my care of the patient were reviewed by me and considered in my medical decision making (see chart for details).    Worsening SOB and BLE edema, requiring 2L O2 to maintain O2 sat ~92%. No respiratory distress. Afebrile, hypertensive at 180/55.  Chest x-ray shows pulmonary edema with bilateral pleural effusions and BNP is elevated at 1190.  Negative troponin, creatinine 1.37.  Findings suggestive of CHF exacerbation, gave IV Lasix.  No infectious symptoms to suggest PNA and patient is anticoagulated therefore I doubt PE.  Discussed admission with [facility] and pt admitted for further care.  Final Clinical Impressions(s) / ED Diagnoses   Final diagnoses:  None    ED Discharge Orders    None       Little, Wenda Overland, MD 12/13/17 1750

## 2017-12-14 ENCOUNTER — Other Ambulatory Visit: Payer: Self-pay

## 2017-12-14 ENCOUNTER — Inpatient Hospital Stay (HOSPITAL_COMMUNITY): Payer: Medicare Other

## 2017-12-14 DIAGNOSIS — I5033 Acute on chronic diastolic (congestive) heart failure: Secondary | ICD-10-CM

## 2017-12-14 DIAGNOSIS — R609 Edema, unspecified: Secondary | ICD-10-CM

## 2017-12-14 LAB — CBC
HCT: 34.3 % — ABNORMAL LOW (ref 36.0–46.0)
HEMOGLOBIN: 10.9 g/dL — AB (ref 12.0–15.0)
MCH: 28.9 pg (ref 26.0–34.0)
MCHC: 31.8 g/dL (ref 30.0–36.0)
MCV: 91 fL (ref 78.0–100.0)
Platelets: 220 10*3/uL (ref 150–400)
RBC: 3.77 MIL/uL — AB (ref 3.87–5.11)
RDW: 15.4 % (ref 11.5–15.5)
WBC: 6.8 10*3/uL (ref 4.0–10.5)

## 2017-12-14 LAB — BASIC METABOLIC PANEL
Anion gap: 11 (ref 5–15)
BUN: 19 mg/dL (ref 6–20)
CALCIUM: 8.7 mg/dL — AB (ref 8.9–10.3)
CO2: 21 mmol/L — AB (ref 22–32)
Chloride: 97 mmol/L — ABNORMAL LOW (ref 101–111)
Creatinine, Ser: 1.28 mg/dL — ABNORMAL HIGH (ref 0.44–1.00)
GFR calc non Af Amer: 36 mL/min — ABNORMAL LOW (ref >60)
GFR, EST AFRICAN AMERICAN: 42 mL/min — AB (ref >60)
GLUCOSE: 83 mg/dL (ref 65–99)
Potassium: 4.4 mmol/L (ref 3.5–5.1)
Sodium: 129 mmol/L — ABNORMAL LOW (ref 135–145)

## 2017-12-14 LAB — TROPONIN I
Troponin I: <0.03 ng/mL (ref <0.03)
Troponin I: 0.04 ng/mL (ref <0.03)

## 2017-12-14 LAB — MAGNESIUM: MAGNESIUM: 1.8 mg/dL (ref 1.7–2.4)

## 2017-12-14 LAB — MRSA PCR SCREENING: MRSA BY PCR: POSITIVE — AB

## 2017-12-14 MED ORDER — CHLORHEXIDINE GLUCONATE CLOTH 2 % EX PADS
6.0000 | MEDICATED_PAD | Freq: Every day | CUTANEOUS | Status: DC
  Administered 2017-12-14: 6 via TOPICAL

## 2017-12-14 MED ORDER — MUPIROCIN 2 % EX OINT
1.0000 "application " | TOPICAL_OINTMENT | Freq: Two times a day (BID) | CUTANEOUS | Status: DC
  Administered 2017-12-14 – 2017-12-16 (×5): 1 via NASAL
  Filled 2017-12-14 (×2): qty 22

## 2017-12-14 MED ORDER — MENTHOL 3 MG MT LOZG
1.0000 | LOZENGE | OROMUCOSAL | Status: DC | PRN
  Administered 2017-12-14: 3 mg via ORAL
  Filled 2017-12-14: qty 9

## 2017-12-14 NOTE — Progress Notes (Signed)
PROGRESS NOTE    Brittney Gutierrez  GLO:[id number] DOB: [birth year] DOA: 12/13/2017 PCP: Gayland Curry, DO   Brief Narrative:  84 y.o. female with Past medical history of chronic diastolic CHF, essential hypertension, chronic A. fib, GERD, depression, aortic insufficiency. Patient presents with complaints of shortness of breath ongoing since Friday.  Denies any chest pain or chest heaviness but having some funny sensation in her chest.   Assessment & Plan:    Acute on chronic diastolic CHF (congestive heart failure) (HCC) Hypertensive urgency. With complaints of shortness of breath and dyspnea on exertion.  Chest x-ray shows vascular congestion, BNP elevated, hypertension uncontrolled. Most likely this presentation is consistent with acute on chronic diastolic CHF. Last echocardiogram in 2018 shows preserved EF no significant valvular or wall motion abnormalities. - Monitor in and out, daily weights - agree with continuing lasix IV 40 mg IV BID - Pt on metroprolol, imdur  Elevated troponin - in context of patient with no chest pain and admission secondary to sob 2ary to Acute CHF exacerbation. Most likely due to demand ischemia at this point. Troponin trend flat.    Acute hypoxic respiratory failure. Does not use oxygen at her baseline Although presentation is consistent with acute CHF possibility of other etiology cannot be ruled out. Lower extremity DVT Doppler as well. Patient is already on anticoagulation (Eliquis) as such suspicion of PE is less likely.     Persistent A. fib S/P cardioversion in August 2018. On Eliquis.CHADS VASc=6 Currently in sinus rhythm and rate controlled. Continue beta-blocker. Pt is on Eliquis    Chronic kidney disease stage III. Baseline serum creatinine 1.2. Currently 1.2 on admission. As such at baseline     Dementia. Patient does have some delirium during past admission. Monitor.    Depression. Stable continue venlafaxine.    GERD. Stable continue PPI.    Generalized fatigue and deconditioning. PT OT consulted.    DVT prophylaxis: Eliquis Code Status: partial please see specifications in chart Family Communication: d/c patient and family member Disposition Plan: pending improvement in respiratory condition.   Consultants:   None   Procedures: None   Antimicrobials: None   Subjective: Pt has no new complaints. SOB still but improved from presentation  Objective: Vitals:   12/14/17 0753 12/14/17 1000 12/14/17 1234 12/14/17 1405  BP: (!) 154/56 (!) 95/59 (!) 145/50 (!) 151/69  Pulse:      Resp:      Temp: 98.5 F (36.9 C)  98.3 F (36.8 C)   TempSrc: Oral  Oral   SpO2:      Weight:      Height:        Intake/Output Summary (Last 24 hours) at 12/14/2017 1412 Last data filed at 12/14/2017 1000 Gross per 24 hour  Intake 240 ml  Output 900 ml  Net -660 ml   Filed Weights   12/13/17 1602 12/13/17 2000 12/14/17 0500  Weight: 57.2 kg (126 lb) 60 kg (132 lb 4.4 oz) 59.6 kg (131 lb 6.3 oz)    Examination:  General exam: Appears calm and comfortable, in nad. Respiratory system: decreased breath sounds at bases, no wheezes,  Equal chest rise. Cardiovascular system: S1 & S2 heard, RRR. No JVD, murmurs or rubs Gastrointestinal system: Abdomen is nondistended, soft and nontender. No organomegaly or masses felt. Normal bowel sounds heard. Central nervous system: Alert and oriented. No focal neurological deficits. Extremities: Symmetric 5 x 5 power. Skin: No rashes, lesions or ulcers, on limited exam. Psychiatry:  Mood &  affect appropriate.     Data Reviewed: I have personally reviewed following labs and imaging studies  CBC: Recent Labs  Lab 12/13/17 1608 12/14/17 0732  WBC 6.3 6.8  HGB 11.8* 10.9*  HCT 37.1 34.3*  MCV 91.2 91.0  PLT 296 250   Basic Metabolic Panel: Recent Labs  Lab 12/13/17 1608 12/14/17 0732  NA 130* 129*  K 4.5 4.4  CL 100* 97*  CO2 22 21*  GLUCOSE  132* 83  BUN 24* 19  CREATININE 1.37* 1.28*  CALCIUM 9.3 8.7*  MG  --  1.8   GFR: Estimated Creatinine Clearance: 24 mL/min (A) (by C-G formula based on SCr of 1.28 mg/dL (H)). Liver Function Tests: No results for input(s): AST, ALT, ALKPHOS, BILITOT, PROT, ALBUMIN in the last 168 hours. No results for input(s): LIPASE, AMYLASE in the last 168 hours. No results for input(s): AMMONIA in the last 168 hours. Coagulation Profile: No results for input(s): INR, PROTIME in the last 168 hours. Cardiac Enzymes: Recent Labs  Lab 12/13/17 1608 12/13/17 2321 12/14/17 0732  TROPONINI 0.03* <0.03 0.04*   BNP (last 3 results) No results for input(s): PROBNP in the last 8760 hours. HbA1C: No results for input(s): HGBA1C in the last 72 hours. CBG: No results for input(s): GLUCAP in the last 168 hours. Lipid Profile: No results for input(s): CHOL, HDL, LDLCALC, TRIG, CHOLHDL, LDLDIRECT in the last 72 hours. Thyroid Function Tests: Recent Labs    12/13/17 1840  TSH 8.725*   Anemia Panel: No results for input(s): VITAMINB12, FOLATE, FERRITIN, TIBC, IRON, RETICCTPCT in the last 72 hours. Sepsis Labs: Recent Labs  Lab 12/13/17 1608 12/13/17 1840  PROCALCITON <0.10  --   LATICACIDVEN  --  1.2    Recent Results (from the past 240 hour(s))  MRSA PCR Screening     Status: Abnormal   Collection Time: 12/13/17 11:11 PM  Result Value Ref Range Status   MRSA by PCR POSITIVE (A) NEGATIVE Final    Comment:        The GeneXpert MRSA Assay (FDA approved for NASAL specimens only), is one component of a comprehensive MRSA colonization surveillance program. It is not intended to diagnose MRSA infection nor to guide or monitor treatment for MRSA infections. RESULT CALLED TO, READ BACK BY AND VERIFIED WITH: HITT,L RN [phone] 12/14/17 MITCHELL,L      Radiology Studies: Dg Chest 2 View  Result Date: 12/13/2017 CLINICAL DATA:  Pt here due to increasing SOB and BLE swelling since yesterday.  Recently finished antibiotics for UTI; Hx of CHF and TIA; non-smoker EXAM: CHEST  2 VIEW FINDINGS: The heart is enlarged. There is perihilar peribronchial thickening. There is dense opacity at the left lung base obscuring the hemidiaphragm, and compatible with infiltrate or atelectasis and pleural effusion. Small right pleural effusion. There is increased interstitial markings which may reflect mild edema. IMPRESSION: 1. Cardiomegaly and increased interstitial markings in bilateral pleural effusions, favoring increased edema. 2. Increased opacity in the left lung base consistent with infiltrate/atelectasis. Electronically Signed   By: Nolon Nations M.D.   On: 12/13/2017 17:04    Scheduled Meds: . amLODipine  10 mg Oral Daily  . apixaban  2.5 mg Oral BID  . Chlorhexidine Gluconate Cloth  6 each Topical Q0600  . furosemide  20 mg Intravenous Once  . furosemide  40 mg Intravenous BID  . isosorbide mononitrate  30 mg Oral Daily  . losartan  25 mg Oral Daily  . metoprolol tartrate  50 mg  Oral BID  . mupirocin ointment  1 application Nasal BID  . pantoprazole  40 mg Oral Daily  . potassium chloride  20 mEq Oral BID  . sodium chloride flush  3 mL Intravenous Q12H  . venlafaxine  75 mg Oral BID   Continuous Infusions: . sodium chloride       LOS: 1 day    Time spent: > 35 minutes  Velvet Bathe, MD [facility] Pager [phone]  If 7PM-7AM, please contact night-coverage [email or web address] Password [email or web address] 12/14/2017, 2:12 PM

## 2017-12-14 NOTE — Evaluation (Addendum)
Physical Therapy Evaluation Patient Details Name: Brittney Gutierrez MRN: [id number] DOB: 05/29/1927 Today's Date: 12/14/2017   History of Present Illness  Pt adm with Acute on chronic diastolic CHF. PMH - chf, SSS, afib with rvr, dementia  Clinical Impression  Pt admitted with above diagnosis and presents to PT with functional limitations due to deficits listed below (See PT problem list). Pt needs skilled PT to maximize independence and safety to allow discharge to back to [facility]. Will follow acutely. Of note pt with SpO2 84% sitting EOB on RA. Amb on 4L with SpO2 88-89%.     Follow Up Recommendations No PT follow up    Equipment Recommendations  None recommended by PT    Recommendations for Other Services       Precautions / Restrictions Precautions Precautions: Fall Restrictions Weight Bearing Restrictions: No      Mobility  Bed Mobility Overal bed mobility: Modified Independent             General bed mobility comments: Incr time  Transfers Overall transfer level: Needs assistance Equipment used: 4-wheeled walker;None Transfers: Sit to/from [facility] Sit to Stand: Supervision Stand pivot transfers: Min guard       General transfer comment: Assist for safety. Stand pivot bed to bsc without assistive device  Ambulation/Gait Ambulation/Gait assistance: Supervision Ambulation Distance (Feet): 175 Feet Assistive device: 4-wheeled walker Gait Pattern/deviations: Step-through pattern;Decreased stride length Gait velocity: decr   General Gait Details: Amb with rollator on 4L of O2 with SpO2 88-89% toward end of amb.  Stairs            Wheelchair Mobility    Modified Rankin (Stroke Patients Only)       Balance Overall balance assessment: Needs assistance Sitting-balance support: No upper extremity supported;Feet supported Sitting balance-Leahy Scale: Good     Standing balance support: No upper extremity supported;During  functional activity Standing balance-Leahy Scale: Fair                               Pertinent Vitals/Pain Pain Assessment: No/denies pain    Home Living Family/patient expects to be discharged to:: Other (Comment)                 Additional Comments: [facility]    Prior Function Level of Independence: Needs assistance   Gait / Transfers Assistance Needed: Modified independent with rollator or cane           Hand Dominance   Dominant Hand: Right    Extremity/Trunk Assessment   Upper Extremity Assessment Upper Extremity Assessment: Defer to OT evaluation    Lower Extremity Assessment Lower Extremity Assessment: Generalized weakness       Communication      Cognition Arousal/Alertness: Awake/alert Behavior During Therapy: WFL for tasks assessed/performed Overall Cognitive Status: History of cognitive impairments - at baseline                                        General Comments      Exercises     Assessment/Plan    PT Assessment Patient needs continued PT services  PT Problem List Decreased strength;Decreased balance;Decreased mobility       PT Treatment Interventions DME instruction;Gait training;Functional mobility training;Therapeutic activities;Therapeutic exercise;Balance training;Patient/family education    PT Goals (Current goals can be found in the Care  Plan section)  Acute Rehab PT Goals Patient Stated Goal: return home PT Goal Formulation: With patient/family Time For Goal Achievement: 12/21/17 Potential to Achieve Goals: Good    Frequency Min 3X/week   Barriers to discharge        Co-evaluation               AM-PAC PT "6 Clicks" Daily Activity  Outcome Measure Difficulty turning over in bed (including adjusting bedclothes, sheets and blankets)?: None Difficulty moving from lying on back to sitting on the side of the bed? : None Difficulty sitting down on and  standing up from a chair with arms (e.g., wheelchair, bedside commode, etc,.)?: Unable Help needed moving to and from a bed to chair (including a wheelchair)?: A Little Help needed walking in hospital room?: A Little Help needed climbing 3-5 steps with a railing? : A Little 6 Click Score: 18    End of Session Equipment Utilized During Treatment: Gait belt;Oxygen Activity Tolerance: Patient tolerated treatment well Patient left: in bed;with call bell/phone within reach;with family/visitor present   PT Visit Diagnosis: Unsteadiness on feet (R26.81)    Time: 2025-4270 PT Time Calculation (min) (ACUTE ONLY): 37 min   Charges:   PT Evaluation $PT Eval Moderate Complexity: 1 Mod PT Treatments $Gait Training: 8-22 mins   PT G Codes:        [facility] PT [place] 12/14/2017, 5:02 PM

## 2017-12-14 NOTE — Progress Notes (Signed)
Pt transferred from ED with RN, assisted over to bed. Vitals stable at this time, dyspnea with exertion and O2 stats 92% on 4L [place]. Daughters updated and at bedside, will continue to monitor.

## 2017-12-14 NOTE — Progress Notes (Signed)
Bilateral lower extremity venous duplex has been completed. Negative for DVT.  12/14/17 3:03 PM Brittney Gutierrez RVT

## 2017-12-15 LAB — CBC
HCT: 33 % — ABNORMAL LOW (ref 36.0–46.0)
Hemoglobin: 10.3 g/dL — ABNORMAL LOW (ref 12.0–15.0)
MCH: 28.5 pg (ref 26.0–34.0)
MCHC: 31.2 g/dL (ref 30.0–36.0)
MCV: 91.2 fL (ref 78.0–100.0)
PLATELETS: 221 10*3/uL (ref 150–400)
RBC: 3.62 MIL/uL — ABNORMAL LOW (ref 3.87–5.11)
RDW: 15.2 % (ref 11.5–15.5)
WBC: 6.4 10*3/uL (ref 4.0–10.5)

## 2017-12-15 LAB — BASIC METABOLIC PANEL
ANION GAP: 10 (ref 5–15)
BUN: 21 mg/dL — ABNORMAL HIGH (ref 6–20)
CALCIUM: 8.1 mg/dL — AB (ref 8.9–10.3)
CO2: 23 mmol/L (ref 22–32)
CREATININE: 1.62 mg/dL — AB (ref 0.44–1.00)
Chloride: 100 mmol/L — ABNORMAL LOW (ref 101–111)
GFR calc Af Amer: 32 mL/min — ABNORMAL LOW (ref >60)
GFR, EST NON AFRICAN AMERICAN: 27 mL/min — AB (ref >60)
Glucose, Bld: 68 mg/dL (ref 65–99)
Potassium: 4.2 mmol/L (ref 3.5–5.1)
SODIUM: 133 mmol/L — AB (ref 135–145)

## 2017-12-15 MED ORDER — POTASSIUM CHLORIDE CRYS ER 10 MEQ PO TBCR
10.0000 meq | EXTENDED_RELEASE_TABLET | Freq: Every day | ORAL | Status: DC
  Administered 2017-12-16: 10 meq via ORAL

## 2017-12-15 MED ORDER — FUROSEMIDE 40 MG PO TABS
40.0000 mg | ORAL_TABLET | Freq: Every day | ORAL | Status: DC
  Administered 2017-12-16: 40 mg via ORAL
  Filled 2017-12-15: qty 1

## 2017-12-15 NOTE — Evaluation (Signed)
Occupational Therapy Evaluation Patient Details Name: Brittney Gutierrez MRN: [id number] DOB: 1927/08/07 Today's Date: 12/15/2017    History of Present Illness Pt adm with Acute on chronic diastolic CHF. PMH - chf, SSS, afib with rvr, dementia   Clinical Impression   PTA, pt was living in independent living at [facility] and was able to complete basic ADL and functional mobility with Rollator. Pt currently requires min guard assist for toilet transfers and LB ADL. She presents with decreased activity tolerance for ADL and generalized weakness impacting her ability to participate in ADL at [facility]. Pt does demonstrate cognitive deficits at baseline. She would benefit from continued OT services while admitted to improve independence and safety with ADL and functional mobility. Do not anticipate need for OT follow-up once back at ILF.    Follow Up Recommendations  No OT follow up(return to ILF)    Equipment Recommendations  None recommended by OT    Recommendations for Other Services       Precautions / Restrictions Precautions Precautions: Fall Restrictions Weight Bearing Restrictions: No      Mobility Bed Mobility Overal bed mobility: Modified Independent             General bed mobility comments: Incr time  Transfers Overall transfer level: Needs assistance Equipment used: Rolling walker (2 wheeled) Transfers: Sit to/from [facility] Sit to Stand: Min guard         General transfer comment: Min guard assist for safety.     Balance Overall balance assessment: Needs assistance Sitting-balance support: No upper extremity supported;Feet supported Sitting balance-Leahy Scale: Good     Standing balance support: No upper extremity supported;During functional activity Standing balance-Leahy Scale: Fair Standing balance comment: Able to statically stand without UE support but requires B UE support for ambulation.                             ADL either performed or assessed with clinical judgement   ADL Overall ADL's : Needs assistance/impaired Eating/Feeding: Set up;Sitting   Grooming: Supervision/safety;Sitting   Upper Body Bathing: Set up;Sitting   Lower Body Bathing: Supervison/ safety;Sit to/from stand   Upper Body Dressing : Set up;Sitting   Lower Body Dressing: Supervision/safety;Sit to/from stand   Toilet Transfer: Min guard;Ambulation;RW Toilet Transfer Details (indicate cue type and reason): Simualted with sit<>stand followed by functional mobility in room.  Toileting- Water quality scientist and Hygiene: Min guard;Sit to/from stand       Functional mobility during ADLs: Min guard;Rolling walker General ADL Comments: Very pleasant and willing to participate. Reports being cold and provided warmed blankets.      Vision Patient Visual Report: No change from baseline Vision Assessment?: No apparent visual deficits     Perception     Praxis      Pertinent Vitals/Pain Pain Assessment: No/denies pain     Hand Dominance Right   Extremity/Trunk Assessment Upper Extremity Assessment Upper Extremity Assessment: Generalized weakness   Lower Extremity Assessment Lower Extremity Assessment: Generalized weakness       Communication     Cognition Arousal/Alertness: Awake/alert Behavior During Therapy: WFL for tasks assessed/performed Overall Cognitive Status: History of cognitive impairments - at baseline                                 General Comments: Dementia at baseline. Notable decreased memory   General Comments  Exercises     Shoulder Instructions      Home Living Family/patient expects to be discharged to:: Other (Comment)(independent living facility)                                 Additional Comments: [facility]      Prior Functioning/Environment Level of Independence: Needs assistance  Gait / Transfers Assistance  Needed: Modified independent with rollator or cane ADL's / Homemaking Assistance Needed: Modified independent for basic ADL. Assist with meals and meds.             OT Problem List: Decreased strength;Decreased range of motion;Decreased activity tolerance;Impaired balance (sitting and/or standing);Decreased safety awareness;Decreased knowledge of precautions;Decreased knowledge of use of DME or AE      OT Treatment/Interventions: Self-care/ADL training;Therapeutic exercise;Energy conservation;DME and/or AE instruction;Therapeutic activities;Patient/family education;Balance training;Cognitive remediation/compensation    OT Goals(Current goals can be found in the care plan section) Acute Rehab OT Goals Patient Stated Goal: return home OT Goal Formulation: With patient/family Time For Goal Achievement: 12/29/17 Potential to Achieve Goals: Fair ADL Goals Pt Will Perform Grooming: with modified independence;standing Pt Will Perform Lower Body Dressing: with modified independence;sit to/from stand Pt Will Transfer to Toilet: with modified independence;ambulating(comfort height toilet) Pt Will Perform Toileting - Clothing Manipulation and hygiene: with modified independence;sit to/from stand  OT Frequency: Min 2X/week   Barriers to D/C:            Co-evaluation              AM-PAC PT "6 Clicks" Daily Activity     Outcome Measure Help from another person eating meals?: None Help from another person taking care of personal grooming?: A Little Help from another person toileting, which includes using toliet, bedpan, or urinal?: A Little Help from another person bathing (including washing, rinsing, drying)?: A Little Help from another person to put on and taking off regular upper body clothing?: A Little Help from another person to put on and taking off regular lower body clothing?: A Little 6 Click Score: 19   End of Session Equipment Utilized During Treatment: Gait belt;Rolling  walker Nurse Communication: Mobility status  Activity Tolerance: Patient tolerated treatment well Patient left: in chair;with call bell/phone within reach;with family/visitor present  OT Visit Diagnosis: Muscle weakness (generalized) (M62.81)                Time: 3220-2542 OT Time Calculation (min): 18 min Charges:  OT General Charges $OT Visit: 1 Visit OT Evaluation $OT Eval Moderate Complexity: 1 Mod G-Codes:     Norman Herrlich, MS OTR/L  Pager: Clayton A Jozalyn Baglio 12/15/2017, 4:39 PM

## 2017-12-15 NOTE — Progress Notes (Signed)
PROGRESS NOTE    Brittney Gutierrez  JSE:[id number] DOB: December 09, 1927 DOA: 12/13/2017 PCP: Gayland Curry, DO   Brief Narrative:  84 y.o. female with Past medical history of chronic diastolic CHF, essential hypertension, chronic A. fib, GERD, depression, aortic insufficiency. Patient presents with complaints of shortness of breath ongoing since Friday.  Denies any chest pain or chest heaviness but having some funny sensation in her chest.   Assessment & Plan:    Acute on chronic diastolic CHF (congestive heart failure) (HCC) Hypertensive urgency. With complaints of shortness of breath and dyspnea on exertion.  Chest x-ray shows vascular congestion, BNP elevated, hypertension uncontrolled. Most likely this presentation is consistent with acute on chronic diastolic CHF. Last echocardiogram in 2018 shows preserved EF no significant valvular or wall motion abnormalities. - Monitor in and out, daily weights - was on lasix IV but serum creatinine elevated and oxygen saturation at 100 on check. Will wean to room air, see how patient does with Physical therapy services and monitor serum creatinine. Next am will resume lasix at home dose if continued improvement. - Pt on metroprolol, imdur  Elevated troponin - in context of patient with no chest pain and admission secondary to sob 2ary to Acute CHF exacerbation. Most likely due to demand ischemia at this point. Troponin trend flat. - No chest pain reported today.    Acute hypoxic respiratory failure. Does not use oxygen at her baseline Although presentation is consistent with acute CHF possibility of other etiology cannot be ruled out. Lower extremity DVT Doppler as well. Patient is already on anticoagulation (Eliquis) as such suspicion of PE is less likely.     Persistent A. fib S/P cardioversion in August 2018. On Eliquis.CHADS VASc=6 Currently in sinus rhythm and rate controlled. Continue beta-blocker. Pt is on Eliquis    Chronic  kidney disease stage III. Baseline serum creatinine 1.2. Currently 1.6 almost meets AKI criteria. As such hold lasix. Monitor serum creatinine and continue lasix at lower dose next am.    Dementia. Patient does have some delirium during past admission. Monitor.    Depression. Stable continue venlafaxine.   GERD. Stable continue PPI.    Generalized fatigue and deconditioning. PT OT consulted.    DVT prophylaxis: Eliquis Code Status: partial please see specifications in chart Family Communication: d/c patient and family member ( suspected daughter) Disposition Plan: pending improvement in respiratory condition.   Consultants:   None   Procedures: None   Antimicrobials: None   Subjective: SOB ameliorating. No new complaints otherwise.  Objective: Vitals:   12/15/17 0129 12/15/17 0144 12/15/17 0609 12/15/17 0903  BP:   (!) 144/46 (!) 144/65  Pulse: (!) 58 (!) 53 (!) 41 (!) 58  Resp: 20 19 18  (!) 29  Temp:   (!) 97.5 F (36.4 C) 98.4 F (36.9 C)  TempSrc:   Oral Oral  SpO2: 91% 96% 93% 97%  Weight:   58.1 kg (128 lb 1.4 oz)   Height:        Intake/Output Summary (Last 24 hours) at 12/15/2017 1158 Last data filed at 12/14/2017 2325 Gross per 24 hour  Intake -  Output 325 ml  Net -325 ml   Filed Weights   12/13/17 2000 12/14/17 0500 12/15/17 0609  Weight: 60 kg (132 lb 4.4 oz) 59.6 kg (131 lb 6.3 oz) 58.1 kg (128 lb 1.4 oz)    Examination:   General exam: Pt in nad, Appears calm and comfortable Respiratory system: CTA BL, no wheezes,  Equal chest  rise. Cardiovascular system: S1 & S2 heard, RRR. No JVD, murmurs or rubs Gastrointestinal system: Abdomen is nondistended, soft and nontender. No organomegaly or masses felt. Normal bowel sounds heard. Central nervous system: Alert and oriented. No focal neurological deficits. Extremities: Symmetric 5 x 5 power. Skin: No rashes, lesions or ulcers, on limited exam. Psychiatry:  Mood & affect appropriate.      Data Reviewed: I have personally reviewed following labs and imaging studies  CBC: Recent Labs  Lab 12/13/17 1608 12/14/17 0732 12/15/17 0226  WBC 6.3 6.8 6.4  HGB 11.8* 10.9* 10.3*  HCT 37.1 34.3* 33.0*  MCV 91.2 91.0 91.2  PLT 296 220 932   Basic Metabolic Panel: Recent Labs  Lab 12/13/17 1608 12/14/17 0732 12/15/17 0226  NA 130* 129* 133*  K 4.5 4.4 4.2  CL 100* 97* 100*  CO2 22 21* 23  GLUCOSE 132* 83 68  BUN 24* 19 21*  CREATININE 1.37* 1.28* 1.62*  CALCIUM 9.3 8.7* 8.1*  MG  --  1.8  --    GFR: Estimated Creatinine Clearance: 19 mL/min (A) (by C-G formula based on SCr of 1.62 mg/dL (H)). Liver Function Tests: No results for input(s): AST, ALT, ALKPHOS, BILITOT, PROT, ALBUMIN in the last 168 hours. No results for input(s): LIPASE, AMYLASE in the last 168 hours. No results for input(s): AMMONIA in the last 168 hours. Coagulation Profile: No results for input(s): INR, PROTIME in the last 168 hours. Cardiac Enzymes: Recent Labs  Lab 12/13/17 1608 12/13/17 2321 12/14/17 0732  TROPONINI 0.03* <0.03 0.04*   BNP (last 3 results) No results for input(s): PROBNP in the last 8760 hours. HbA1C: No results for input(s): HGBA1C in the last 72 hours. CBG: No results for input(s): GLUCAP in the last 168 hours. Lipid Profile: No results for input(s): CHOL, HDL, LDLCALC, TRIG, CHOLHDL, LDLDIRECT in the last 72 hours. Thyroid Function Tests: Recent Labs    12/13/17 1840  TSH 8.725*   Anemia Panel: No results for input(s): VITAMINB12, FOLATE, FERRITIN, TIBC, IRON, RETICCTPCT in the last 72 hours. Sepsis Labs: Recent Labs  Lab 12/13/17 1608 12/13/17 1840  PROCALCITON <0.10  --   LATICACIDVEN  --  1.2    Recent Results (from the past 240 hour(s))  MRSA PCR Screening     Status: Abnormal   Collection Time: 12/13/17 11:11 PM  Result Value Ref Range Status   MRSA by PCR POSITIVE (A) NEGATIVE Final    Comment:        The GeneXpert MRSA Assay  (FDA approved for NASAL specimens only), is one component of a comprehensive MRSA colonization surveillance program. It is not intended to diagnose MRSA infection nor to guide or monitor treatment for MRSA infections. RESULT CALLED TO, READ BACK BY AND VERIFIED WITH: HITT,L RN [phone] 12/14/17 MITCHELL,L      Radiology Studies: Dg Chest 2 View  Result Date: 12/13/2017 CLINICAL DATA:  Pt here due to increasing SOB and BLE swelling since yesterday. Recently finished antibiotics for UTI; Hx of CHF and TIA; non-smoker EXAM: CHEST  2 VIEW FINDINGS: The heart is enlarged. There is perihilar peribronchial thickening. There is dense opacity at the left lung base obscuring the hemidiaphragm, and compatible with infiltrate or atelectasis and pleural effusion. Small right pleural effusion. There is increased interstitial markings which may reflect mild edema. IMPRESSION: 1. Cardiomegaly and increased interstitial markings in bilateral pleural effusions, favoring increased edema. 2. Increased opacity in the left lung base consistent with infiltrate/atelectasis. Electronically Signed   By:  Nolon Nations M.D.   On: 12/13/2017 17:04    Scheduled Meds: . amLODipine  10 mg Oral Daily  . apixaban  2.5 mg Oral BID  . Chlorhexidine Gluconate Cloth  6 each Topical Q0600  . furosemide  20 mg Intravenous Once  . [START ON 12/16/2017] furosemide  40 mg Oral Daily  . isosorbide mononitrate  30 mg Oral Daily  . losartan  25 mg Oral Daily  . metoprolol tartrate  50 mg Oral BID  . mupirocin ointment  1 application Nasal BID  . pantoprazole  40 mg Oral Daily  . potassium chloride  20 mEq Oral BID  . sodium chloride flush  3 mL Intravenous Q12H  . venlafaxine  75 mg Oral BID   Continuous Infusions: . sodium chloride       LOS: 2 days    Time spent: > 35 minutes  Velvet Bathe, MD [facility] Pager [phone]  If 7PM-7AM, please contact night-coverage [email or web address] Password [email or web address] 12/15/2017,  11:58 AM

## 2017-12-16 LAB — BASIC METABOLIC PANEL
Anion gap: 9 (ref 5–15)
BUN: 18 mg/dL (ref 6–20)
CALCIUM: 8.6 mg/dL — AB (ref 8.9–10.3)
CO2: 24 mmol/L (ref 22–32)
CREATININE: 1.54 mg/dL — AB (ref 0.44–1.00)
Chloride: 99 mmol/L — ABNORMAL LOW (ref 101–111)
GFR calc Af Amer: 34 mL/min — ABNORMAL LOW (ref >60)
GFR calc non Af Amer: 29 mL/min — ABNORMAL LOW (ref >60)
GLUCOSE: 91 mg/dL (ref 65–99)
Potassium: 4.2 mmol/L (ref 3.5–5.1)
Sodium: 132 mmol/L — ABNORMAL LOW (ref 135–145)

## 2017-12-16 LAB — CBC
HCT: 37.4 % (ref 36.0–46.0)
Hemoglobin: 11.6 g/dL — ABNORMAL LOW (ref 12.0–15.0)
MCH: 28.4 pg (ref 26.0–34.0)
MCHC: 31 g/dL (ref 30.0–36.0)
MCV: 91.4 fL (ref 78.0–100.0)
PLATELETS: 255 10*3/uL (ref 150–400)
RBC: 4.09 MIL/uL (ref 3.87–5.11)
RDW: 15.5 % (ref 11.5–15.5)
WBC: 6.3 10*3/uL (ref 4.0–10.5)

## 2017-12-16 MED ORDER — FUROSEMIDE 40 MG PO TABS: 40.0000 mg | ORAL_TABLET | Freq: Every day | ORAL | 0 refills | Status: DC

## 2017-12-16 NOTE — Discharge Summary (Signed)
Physician Discharge Summary  Brittney Gutierrez ZDG:[id number] DOB: December 07, 1927 DOA: 12/13/2017  PCP: Gayland Curry, DO  Admit date: 12/13/2017 Discharge date: 12/16/2017  Time spent: > 35 minutes  Recommendations for Outpatient Follow-up:  1. Please monitor serum creatinine. Lasix recently increased from 20 mg a 40 mg by mouth daily. As such monitor potassium levels. Last potassium of 4.2   Discharge Diagnoses:  Principal Problem:   Acute on chronic diastolic CHF (congestive heart failure) (HCC) Active Problems:   Persistent atrial fibrillation (HCC)   Aortic insufficiency   PAH (pulmonary artery hypertension) (HCC)   Essential hypertension   GERD (gastroesophageal reflux disease)   Depression   Current use of long term anticoagulation   Hypertensive cardiovascular disease   Discharge Condition: stable  Diet recommendation: Heart healthy  Filed Weights   12/15/17 0609 12/15/17 1516 12/16/17 0628  Weight: 58.1 kg (128 lb 1.4 oz) 56.8 kg (125 lb 3.2 oz) 56.3 kg (124 lb 3.2 oz)    History of present illness:  84 y.o.femalewith Past medical history ofchronic diastolic CHF, essential hypertension, chronic A. fib, GERD, depression, aortic insufficiency. Patient presents with complaints of shortness of breath ongoing since Friday.  Patient diagnosed with acute on chronic diastolic congestive heart failure  Hospital Course:  Patient presented with acute on chronic diastolic congestive heart failure which improved with IV Lasix diuresis. Patient was able to be transitioned back to her home dose and home Lasix regimen was increased to 40 mg by mouth daily.  Patient had slight elevation in troponin which was closer to 0.03. She never complained of chest discomfort and this is most likely secondary to demand ischemia.  The patient will be continued on prior to admission medications regimen to her medical conditions listed above. All other medical problems stable on  discharge  Procedures:  None  Consultations:  None  Discharge Exam: Vitals:   12/16/17 0851 12/16/17 1030  BP: 138/60   Pulse: (!) 54 60  Resp: 18   Temp: 98 F (36.7 C)   SpO2: 93%     General: Pt in nad, alert and awake Cardiovascular: rrr, no rubs Respiratory: CTA BL, no wheezes  Discharge Instructions   Discharge Instructions    Call MD for:  severe uncontrolled pain   Complete by:  As directed    Call MD for:  temperature >100.4   Complete by:  As directed    Diet - low sodium heart healthy   Complete by:  As directed    Increase activity slowly   Complete by:  As directed      Allergies as of 12/16/2017      Reactions   Bactrim [sulfamethoxazole-trimethoprim] Nausea Only   Amiodarone Itching   Amoxicillin Other (See Comments)   Unknown- ? Upset stomach   Demerol [meperidine] Other (See Comments)   Hallucinations   Hydralazine Other (See Comments)   Tingling and chest pain    Lisinopril Cough   Zoloft [sertraline Hcl] Other (See Comments)   Unknown   Tape Rash, Other (See Comments)   Can tear the skin, also      Medication List    TAKE these medications   acetaminophen 325 MG tablet Commonly known as:  TYLENOL Take 650 mg by mouth every 6 (six) hours as needed for mild pain.   amLODipine 10 MG tablet Commonly known as:  NORVASC Take 1 tablet (10 mg total) by mouth daily.   apixaban 2.5 MG Tabs tablet Commonly known as:  ELIQUIS Take 1  tablet (2.5 mg total) by mouth 2 (two) times daily.   docusate sodium 100 MG capsule Commonly known as:  COLACE Take 100 mg by mouth 2 (two) times daily.   donepezil 10 MG tablet Commonly known as:  ARICEPT Take 1 tablet (10 mg total) by mouth daily.   furosemide 20 MG tablet Commonly known as:  LASIX Take 1 tablet (20 mg total) by mouth daily as needed (edema, weight gain of 3 lbs or shortness of breath). What changed:  Another medication with the same name was added. Make sure you understand how and  when to take each.   furosemide 40 MG tablet Commonly known as:  LASIX Take 1 tablet (40 mg total) by mouth daily. Start taking on:  12/17/2017 What changed:  You were already taking a medication with the same name, and this prescription was added. Make sure you understand how and when to take each.   losartan 25 MG tablet Commonly known as:  COZAAR Take 1 tablet (25 mg total) by mouth daily.   metoprolol tartrate 50 MG tablet Commonly known as:  LOPRESSOR Take 50 mg by mouth 2 (two) times daily.   pantoprazole 40 MG tablet Commonly known as:  PROTONIX Take 1 tablet (40 mg total) by mouth daily.   venlafaxine 75 MG tablet Commonly known as:  EFFEXOR Take 1 tablet (75 mg total) 2 (two) times daily by mouth.   Vitamin D3 2000 units Tabs Take 2,000 Units by mouth daily.      Allergies  Allergen Reactions  . Bactrim [Sulfamethoxazole-Trimethoprim] Nausea Only  . Amiodarone Itching  . Amoxicillin Other (See Comments)    Unknown- ? Upset stomach  . Demerol [Meperidine] Other (See Comments)    Hallucinations  . Hydralazine Other (See Comments)    Tingling and chest pain   . Lisinopril Cough  . Zoloft [Sertraline Hcl] Other (See Comments)    Unknown  . Tape Rash and Other (See Comments)    Can tear the skin, also      The results of significant diagnostics from this hospitalization (including imaging, microbiology, ancillary and laboratory) are listed below for reference.    Significant Diagnostic Studies: Dg Chest 2 View  Result Date: 12/13/2017 CLINICAL DATA:  Pt here due to increasing SOB and BLE swelling since yesterday. Recently finished antibiotics for UTI; Hx of CHF and TIA; non-smoker EXAM: CHEST  2 VIEW FINDINGS: The heart is enlarged. There is perihilar peribronchial thickening. There is dense opacity at the left lung base obscuring the hemidiaphragm, and compatible with infiltrate or atelectasis and pleural effusion. Small right pleural effusion. There is  increased interstitial markings which may reflect mild edema. IMPRESSION: 1. Cardiomegaly and increased interstitial markings in bilateral pleural effusions, favoring increased edema. 2. Increased opacity in the left lung base consistent with infiltrate/atelectasis. Electronically Signed   By: Nolon Nations M.D.   On: 12/13/2017 17:04    Microbiology: Recent Results (from the past 240 hour(s))  MRSA PCR Screening     Status: Abnormal   Collection Time: 12/13/17 11:11 PM  Result Value Ref Range Status   MRSA by PCR POSITIVE (A) NEGATIVE Final    Comment:        The GeneXpert MRSA Assay (FDA approved for NASAL specimens only), is one component of a comprehensive MRSA colonization surveillance program. It is not intended to diagnose MRSA infection nor to guide or monitor treatment for MRSA infections. RESULT CALLED TO, READ BACK BY AND VERIFIED WITH: HITT,L RN [phone]  12/14/17 MITCHELL,L      Labs: Basic Metabolic Panel: Recent Labs  Lab 12/13/17 1608 12/14/17 0732 12/15/17 0226 12/16/17 0633  NA 130* 129* 133* 132*  K 4.5 4.4 4.2 4.2  CL 100* 97* 100* 99*  CO2 22 21* 23 24  GLUCOSE 132* 83 68 91  BUN 24* 19 21* 18  CREATININE 1.37* 1.28* 1.62* 1.54*  CALCIUM 9.3 8.7* 8.1* 8.6*  MG  --  1.8  --   --    Liver Function Tests: No results for input(s): AST, ALT, ALKPHOS, BILITOT, PROT, ALBUMIN in the last 168 hours. No results for input(s): LIPASE, AMYLASE in the last 168 hours. No results for input(s): AMMONIA in the last 168 hours. CBC: Recent Labs  Lab 12/13/17 1608 12/14/17 0732 12/15/17 0226 12/16/17 0633  WBC 6.3 6.8 6.4 6.3  HGB 11.8* 10.9* 10.3* 11.6*  HCT 37.1 34.3* 33.0* 37.4  MCV 91.2 91.0 91.2 91.4  PLT 296 220 221 255   Cardiac Enzymes: Recent Labs  Lab 12/13/17 1608 12/13/17 2321 12/14/17 0732  TROPONINI 0.03* <0.03 0.04*   BNP: BNP (last 3 results) Recent Labs    10/28/17 1105 12/13/17 1607  BNP 1,132* 1,190.9*    ProBNP (last 3  results) No results for input(s): PROBNP in the last 8760 hours.  CBG: No results for input(s): GLUCAP in the last 168 hours.   Signed:  Velvet Bathe MD.  [facility] 12/16/2017, 4:39 PM

## 2017-12-16 NOTE — Progress Notes (Signed)
Physical Therapy Treatment Patient Details Name: Brittney Gutierrez MRN: [id number] DOB: 1927/06/21 Today's Date: 12/16/2017    History of Present Illness Pt adm with Acute on chronic diastolic CHF. PMH - chf, SSS, afib with rvr, dementia    PT Comments    Pt has become increasingly more deconditioned from a few day hospital stay.  She has limited gait distance and fatigue.  She normally walks to her dining hall to eat and I don't think she could easily do this right now.  HHPT would be beneficial to get her back to her daily activities and routine.     Follow Up Recommendations  Home health PT;Supervision - Intermittent     Equipment Recommendations  None recommended by PT    Recommendations for Other Services   NA     Precautions / Restrictions Precautions Precautions: Fall;Other (comment) Precaution Comments: monitor O2 sats with gait Restrictions Weight Bearing Restrictions: No    Mobility  Bed Mobility Overal bed mobility: Modified Independent             General bed mobility comments: Pt is OOB in the recliner cahir.    Transfers Overall transfer level: Needs assistance Equipment used: 4-wheeled walker Transfers: Sit to/from Stand Sit to Stand: Supervision Stand pivot transfers: Min guard       General transfer comment: Supervision for safety  Ambulation/Gait Ambulation/Gait assistance: Supervision Ambulation Distance (Feet): 85 Feet Assistive device: 4-wheeled walker Gait Pattern/deviations: Step-through pattern;Shuffle;Trunk flexed     General Gait Details: Pt with flexed, shuffling pattern.  She was able to control the rollator well enough (this is her usual AD at home).  O2 sats 90-91% on RA.  HR in the 60s during gait. Pt easily fatigued and deconditioned.           Balance Overall balance assessment: Needs assistance Sitting-balance support: Feet supported;No upper extremity supported Sitting balance-Leahy Scale: Good     Standing  balance support: No upper extremity supported;Bilateral upper extremity supported Standing balance-Leahy Scale: Fair                              Cognition Arousal/Alertness: Awake/alert Behavior During Therapy: WFL for tasks assessed/performed Overall Cognitive Status: History of cognitive impairments - at baseline                                 General Comments: Dementia at baseline. Notable decreased memory.  Unable to report to me her own birthday clearly      Exercises General Exercises - Lower Extremity Long Arc Quad: AROM;Both;10 reps Hip Flexion/Marching: AROM;Both;10 reps Toe Raises: AROM;Both;20 reps Heel Raises: AROM;Both;20 reps        Pertinent Vitals/Pain Pain Assessment: No/denies pain           PT Goals (current goals can now be found in the care plan section) Acute Rehab PT Goals Patient Stated Goal: return home Progress towards PT goals: Progressing toward goals    Frequency    Min 3X/week      PT Plan Discharge plan needs to be updated       AM-PAC PT "6 Clicks" Daily Activity  Outcome Measure  Difficulty turning over in bed (including adjusting bedclothes, sheets and blankets)?: None Difficulty moving from lying on back to sitting on the side of the bed? : None Difficulty sitting down on and standing up from a chair with  arms (e.g., wheelchair, bedside commode, etc,.)?: None Help needed moving to and from a bed to chair (including a wheelchair)?: None Help needed walking in hospital room?: None Help needed climbing 3-5 steps with a railing? : A Lot 6 Click Score: 22    End of Session   Activity Tolerance: Patient limited by fatigue Patient left: in chair;with call bell/phone within reach;with family/visitor present Nurse Communication: Mobility status PT Visit Diagnosis: Unsteadiness on feet (R26.81)     Time: 1505-6979 PT Time Calculation (min) (ACUTE ONLY): 27 min  Charges:  $Gait Training: 8-22  mins $Therapeutic Exercise: 8-22 mins     Nubia Ziesmer B. Emilio Baylock, PT, DPT [phone]          12/16/2017, 5:24 PM

## 2017-12-16 NOTE — Care Management Note (Signed)
Case Management Note  Patient Details  Name: Brittney Gutierrez MRN: [id number] Date of Birth: 10/16/1927  Subjective/Objective:                  CHF;SOB  Action/Plan: CM spoke with the patient and her daughter [name] at the bedside. [name] states the patient lives in [place] at [facility]. She states she thinks [facility] is used for home health. CM faxed the home health PT order to [phone] per [name] at [facility].   Patient uses a rolling walker at home. She has medication reminders. Her daughter is able to assist the patient as needed.   Expected Discharge Date:  12/16/17               Expected Discharge Plan:  Assisted Living / Rest Home  In-House Referral:     Discharge planning Services  CM Consult  Post Acute Care Choice:    Choice offered to:     DME Arranged:  N/A DME Agency:  NA  HH Arranged:    [place] Agency:  Other - See comment([facility])  Status of Service:  Completed, signed off  If discussed at [place] of Stay Meetings, dates discussed:    Additional Comments:  [name], RN 12/16/2017, 6:34 PM

## 2017-12-16 NOTE — Plan of Care (Signed)
Pt. Tolerating meds and liquids well with thickener and applesauce.

## 2017-12-16 NOTE — Progress Notes (Signed)
Occupational Therapy Treatment Patient Details Name: Brittney Gutierrez MRN: [id number] DOB: 1927-04-01 Today's Date: 12/16/2017    History of present illness Pt adm with Acute on chronic diastolic CHF. PMH - chf, SSS, afib with rvr, dementia   OT comments  Pt making good progress with functional goals. OT will continue to follow acutely  Follow Up Recommendations  No OT follow up    Equipment Recommendations  None recommended by OT    Recommendations for Other Services      Precautions / Restrictions Precautions Precautions: Fall Restrictions Weight Bearing Restrictions: No       Mobility Bed Mobility Overal bed mobility: Modified Independent             General bed mobility comments: Incr time  Transfers Overall transfer level: Needs assistance Equipment used: Rolling walker (2 wheeled) Transfers: Sit to/from [facility] Sit to Stand: Min guard Stand pivot transfers: Min guard       General transfer comment: Min guard assist for safety; pt leaves walker and walks off    Balance Overall balance assessment: Needs assistance Sitting-balance support: No upper extremity supported;Feet supported Sitting balance-Leahy Scale: Good     Standing balance support: No upper extremity supported;During functional activity Standing balance-Leahy Scale: Fair                             ADL either performed or assessed with clinical judgement   ADL       Grooming: Supervision/safety;Standing               Lower Body Dressing: Supervision/safety;Sit to/from stand   Toilet Transfer: Min guard;Ambulation;RW   Toileting- Clothing Manipulation and Hygiene: Sit to/from stand;Supervision/safety         General ADL Comments: pt required cues to stop at sink to wash hands after toileting and continued use of RW from sink back to bed     Vision Patient Visual Report: No change from baseline     Perception     Praxis       Cognition Arousal/Alertness: Awake/alert Behavior During Therapy: WFL for tasks assessed/performed Overall Cognitive Status: History of cognitive impairments - at baseline                                 General Comments: Dementia at baseline. Notable decreased memory        Exercises     Shoulder Instructions       General Comments      Pertinent Vitals/ Pain       Pain Assessment: No/denies pain  Home Living                                          Prior Functioning/Environment              Frequency  Min 2X/week        Progress Toward Goals  OT Goals(current goals can now be found in the care plan section)  Progress towards OT goals: Progressing toward goals  Acute Rehab OT Goals Patient Stated Goal: return home  Plan Discharge plan remains appropriate    Co-evaluation                 AM-PAC PT "6 Clicks" Daily Activity  Outcome Measure     Help from another person taking care of personal grooming?: A Little Help from another person toileting, which includes using toliet, bedpan, or urinal?: A Little Help from another person bathing (including washing, rinsing, drying)?: A Little Help from another person to put on and taking off regular upper body clothing?: A Little Help from another person to put on and taking off regular lower body clothing?: A Little 6 Click Score: 15    End of Session Equipment Utilized During Treatment: Gait belt;Rolling walker  OT Visit Diagnosis: Muscle weakness (generalized) (M62.81)   Activity Tolerance Patient tolerated treatment well   Patient Left with call bell/phone within reach;with family/visitor present;in bed   Nurse Communication      Functional Assessment Tool Used: AM-PAC 6 Clicks Daily Activity   Time: 4709-2957 OT Time Calculation (min): 25 min  Charges: OT G-codes **NOT FOR INPATIENT CLASS** Functional Assessment Tool Used: AM-PAC 6 Clicks Daily  Activity OT General Charges $OT Visit: 1 Visit OT Treatments $Self Care/Home Management : 8-22 mins $Therapeutic Activity: 8-22 mins     Britt Bottom 12/16/2017, 1:37 PM

## 2017-12-16 NOTE — Care Management Note (Signed)
Case Management Note  Patient Details  Name: Brittney Gutierrez MRN: [id number] Date of Birth: 20-Feb-1927  Subjective/Objective:  Admitted for Congestive heart failure.             Action/Plan: In to speak with patient, daughter at bedside.  Prior to admission patient lived at [facility].  Home DME: Rollator and shower chair.   Also has a scale, RN reminded importance of weighing daily.  Daughter verbalized understanding. Assisted living does the cooking.  Daughter does shopping and takes patient to medical appointments. Primary Care Provider is Hollace Kinnier.  Merchandiser, retail on [place] [place].  Daughter denies inability to afford medications or Food/Nutrition.  NCM will continue to follow for discharge transition needs.           Expected Discharge Plan:  Assisted Living / Rest Home  Discharge planning Services  CM Consult  Status of Service:  In process, will continue to follow  Montel Culver BSN, RN Case Manager-Orientation [phone] 12/16/2017, 3:01 PM

## 2017-12-17 ENCOUNTER — Telehealth: Payer: Self-pay

## 2017-12-17 ENCOUNTER — Telehealth: Payer: Self-pay | Admitting: *Deleted

## 2017-12-17 NOTE — Telephone Encounter (Signed)
Director of [employer] called to confirm that pt will receive Home Health through their [facility] agency.  No further CM needs identified at this time.  Akeiba Axelson J. Clydene Laming, [place], [place], [place]

## 2017-12-17 NOTE — Telephone Encounter (Signed)
Transition Care Management Follow-Up Telephone Call   Date discharged and where:[facility] on 12/16/2017  How have you been since you were released from the hospital?Per pt daugher- she is pretty good, no pain. Weakness but PT coming throughout the week to help strengthen  Any patient concerns? none  Items Reviewed:   Meds: Y- increased lasix to 40mg   Allergies: Y  Dietary Changes Reviewed: Y  Functional Questionnaire:  Independent-I Dependent-D  ADLs:   Dressing- I    Eating- I   Maintaining continence- D   Transferring- I w/ rollator   Transportation- D   Meal Prep- D   Managing Meds- I w/ assisance  Confirmed importance and Date/Time of follow-up visits scheduled: Sherrie Mustache, NP 12/22/2017 @ 10:15   Confirmed with patient if condition worsens to call PCP or go to the Emergency Dept. Patient was given office number and encouraged to call back with questions or concerns: Yes

## 2017-12-19 ENCOUNTER — Telehealth: Payer: Self-pay | Admitting: *Deleted

## 2017-12-19 DIAGNOSIS — R2681 Unsteadiness on feet: Secondary | ICD-10-CM | POA: Diagnosis not present

## 2017-12-19 DIAGNOSIS — M6281 Muscle weakness (generalized): Secondary | ICD-10-CM | POA: Diagnosis not present

## 2017-12-19 DIAGNOSIS — R278 Other lack of coordination: Secondary | ICD-10-CM | POA: Diagnosis not present

## 2017-12-19 DIAGNOSIS — R262 Difficulty in walking, not elsewhere classified: Secondary | ICD-10-CM | POA: Diagnosis not present

## 2017-12-19 NOTE — Telephone Encounter (Signed)
Most likely due to recent hospitalization, hopefully she has home health which will be starting. Encourage her to get up move to increase strength with proper nutrition. If things worsen over the weekend to be evaluated at ED or urgent care but we will check in with her on Monday  Can order home health PT if not already done.

## 2017-12-19 NOTE — Telephone Encounter (Signed)
[name], daughter called and stated that patient was discharged from the hospital on Tuesday and was diagnosed with CHF. Daughter stated that patient is telling her that she feels horrible, no energy, doesn't want to eat or get dressed, just wants to stay in her room. Stated Just can't do for herself. Daughter wonders if it is due to the medication changes.  Daughter wants to know what to do to get her up and get more energy. Stated that she has an appointment on Monday with Janett Billow for hospital follow up. Please Advise.

## 2017-12-19 NOTE — Telephone Encounter (Signed)
Left Jessica's message on [name]'s voicemail. Informed her to let us know about the Home health.

## 2017-12-22 ENCOUNTER — Encounter: Payer: Self-pay | Admitting: Nurse Practitioner

## 2017-12-22 ENCOUNTER — Ambulatory Visit (INDEPENDENT_AMBULATORY_CARE_PROVIDER_SITE_OTHER): Payer: Medicare Other | Admitting: Nurse Practitioner

## 2017-12-22 VITALS — BP 136/82 | HR 61 | Temp 98.4°F | Resp 17 | Ht 62.0 in | Wt 123.4 lb

## 2017-12-22 DIAGNOSIS — I5033 Acute on chronic diastolic (congestive) heart failure: Secondary | ICD-10-CM

## 2017-12-22 DIAGNOSIS — R0602 Shortness of breath: Secondary | ICD-10-CM | POA: Diagnosis not present

## 2017-12-22 DIAGNOSIS — I481 Persistent atrial fibrillation: Secondary | ICD-10-CM | POA: Diagnosis not present

## 2017-12-22 DIAGNOSIS — R2681 Unsteadiness on feet: Secondary | ICD-10-CM | POA: Diagnosis not present

## 2017-12-22 DIAGNOSIS — R278 Other lack of coordination: Secondary | ICD-10-CM | POA: Diagnosis not present

## 2017-12-22 DIAGNOSIS — M6281 Muscle weakness (generalized): Secondary | ICD-10-CM | POA: Diagnosis not present

## 2017-12-22 DIAGNOSIS — I10 Essential (primary) hypertension: Secondary | ICD-10-CM | POA: Diagnosis not present

## 2017-12-22 DIAGNOSIS — F0150 Vascular dementia without behavioral disturbance: Secondary | ICD-10-CM | POA: Diagnosis not present

## 2017-12-22 DIAGNOSIS — R262 Difficulty in walking, not elsewhere classified: Secondary | ICD-10-CM | POA: Diagnosis not present

## 2017-12-22 DIAGNOSIS — I4819 Other persistent atrial fibrillation: Secondary | ICD-10-CM

## 2017-12-22 DIAGNOSIS — R634 Abnormal weight loss: Secondary | ICD-10-CM | POA: Diagnosis not present

## 2017-12-22 LAB — BASIC METABOLIC PANEL WITH GFR
BUN/Creatinine Ratio: 15 (calc) (ref 6–22)
BUN: 22 mg/dL (ref 7–25)
CALCIUM: 9.2 mg/dL (ref 8.6–10.4)
CO2: 27 mmol/L (ref 20–32)
Chloride: 99 mmol/L (ref 98–110)
Creat: 1.5 mg/dL — ABNORMAL HIGH (ref 0.60–0.88)
GFR, Est African American: 36 mL/min/{1.73_m2} — ABNORMAL LOW (ref >=60)
GFR, Est Non African American: 31 mL/min/{1.73_m2} — ABNORMAL LOW (ref >=60)
GLUCOSE: 115 mg/dL (ref 65–139)
POTASSIUM: 3.7 mmol/L (ref 3.5–5.3)
SODIUM: 135 mmol/L (ref 135–146)

## 2017-12-22 NOTE — Progress Notes (Signed)
Careteam: Patient Care Team: Gayland Curry, DO as PCP - General (Geriatric Medicine) Sanda Klein, MD as Consulting Physician (Cardiology) Sherlynn Stalls, MD as Consulting Physician (Ophthalmology) Murriel Hopper, MD as Referring Physician (Student)  Advanced Directive information    Allergies  Allergen Reactions  . Bactrim [Sulfamethoxazole-Trimethoprim] Nausea Only  . Amiodarone Itching  . Amoxicillin Other (See Comments)    Unknown- ? Upset stomach  . Demerol [Meperidine] Other (See Comments)    Hallucinations  . Hydralazine Other (See Comments)    Tingling and chest pain   . Lisinopril Cough  . Zoloft [Sertraline Hcl] Other (See Comments)    Unknown  . Tape Rash and Other (See Comments)    Can tear the skin, also    Chief Complaint  Patient presents with  . Transitions Of Care    Pt is being seen due to recent hospitalization at [facility] 12-13-17 to 12-16-17.      HPI: Patient is a 84 y.o. female seen in the office today to follow up hospitalization. Pt with medical hx  ofchronic diastolic CHF, essential hypertension, chronic A. fib, GERD, depression, aortic insufficiency. She went to the ED with  complaints of shortness of breath ongoing. She was diagnosised and treated for acute CHF, she was treated with IV lasix and then transitioned back to PO lasix at increased dose of 40 mg daily.  reports the first few days were rough transitioning back at home but now doing a little better.  No increase in confusion but increase in weakness but this is better.  Poor diet but hungry now so daughter plans to get her a good lunch before going back to facility.  Moved aricept to the evening and norvasc to the morning  Last few days have been really good.  Has follow up scheduled with cardiologist  Review of Systems:  Review of Systems  Constitutional: Positive for malaise/fatigue and weight loss. Negative for chills and fever.  HENT: Positive for hearing loss.   Eyes:   Glasses  Respiratory: Negative for cough and shortness of breath.   Cardiovascular: Negative for chest pain, palpitations and leg swelling.  Gastrointestinal: Negative for abdominal pain, constipation, diarrhea, heartburn and nausea.       Decrease appetite.   Genitourinary: Negative for dysuria.  Musculoskeletal: Negative for falls.       Unsteady gait  Neurological: Positive for weakness. Negative for dizziness, loss of consciousness and headaches.  Endo/Heme/Allergies: Bruises/bleeds easily.  Psychiatric/Behavioral: Positive for memory loss. Negative for depression. The patient is not nervous/anxious and does not have insomnia.     Past Medical History:  Diagnosis Date  . Abnormal weight loss 05/28/2004  . Anxiety 01/17/2003  . Back pain 08/19/2005   with radiculopathy  . Bradycardia, drug induced    Diltiazem  . Cerebral atherosclerosis 09/24/1999  . Chest pain, atypical 04/2016   minor CAD at cath   . CHF (congestive heart failure) ([place])   . Chronic anticoagulation    Eliquis  . Dementia    "don't know kind or stage" (05/02/2016)  . Depression 09/06/2002  . Diverticulitis 09/28/2007  . External hemorrhoids 04/12/2009  . Female climacteric state 03/04/2000  . 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 GERD (gastroesophageal reflux disease)   . Hypertensive cardiovascular disease    moderate LVH on echo march 2017  . Macular degeneration   . Malaise and fatigue   . 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 Osteoarthrosis, localized   . Osteoporosis 05/28/2004  . Paranoia ([place])   . Paroxysmal A-fib ([place])   . TIA (transient ischemic attack) 2000s?  [name] Urinary incontinence 08/18/2007  . UTI (urinary tract infection)   . Vaginitis, atrophic   . Vertigo, peripheral 10/07/2000   Past Surgical History:  Procedure Laterality Date  . BRAIN SURGERY  2000   Neurocysticercosis ("parasite")  . CARDIAC CATHETERIZATION  N/A 05/03/2016   Procedure: Left Heart Cath and Coronary Angiography;  Surgeon: Sherren Mocha, MD;  Location: [place] [facility];  Service: Cardiovascular;  Laterality: N/A;  . CARDIOVERSION N/A 07/14/2017   Procedure: CARDIOVERSION;  Surgeon: Pixie Casino, MD;  Location: [facility] ENDOSCOPY;  Service: Cardiovascular;  Laterality: N/A;  . CATARACT EXTRACTION  2013  . CATARACT EXTRACTION, BILATERAL     "not sure if both eyes; feel like it probably was"  . JOINT REPLACEMENT    . PERIPHERAL VASCULAR CATHETERIZATION N/A 05/03/2016   Procedure: Abdominal Aortogram;  Surgeon: Sherren Mocha, MD;  Location: [place] [facility];  Service: Cardiovascular;  Laterality: N/A;  . SHOULDER ARTHROSCOPY W/ ROTATOR CUFF REPAIR Right X 3  . TEE WITHOUT CARDIOVERSION N/A 07/14/2017   Procedure: TRANSESOPHAGEAL ECHOCARDIOGRAM (TEE);  Surgeon: Pixie Casino, MD;  Location: [place];  Service: Cardiovascular;  Laterality: N/A;  . TOTAL KNEE ARTHROPLASTY Left 1990  . VAGINAL HYSTERECTOMY  1960   Social History:   reports that  has never smoked. she has never used smokeless tobacco. She reports that she does not drink alcohol or use drugs.  Family History  Problem Relation Age of Onset  . Polycystic kidney disease Mother   . Heart attack Father   . Hypertension Sister   . Dementia Neg Hx     Medications: Patient's Medications  New Prescriptions   No medications on file  Previous Medications   ACETAMINOPHEN (TYLENOL) 325 MG TABLET    Take 650 mg by mouth every 6 (six) hours as needed for mild pain.   AMLODIPINE (NORVASC) 10 MG TABLET    Take 1 tablet (10 mg total) by mouth daily.   APIXABAN (ELIQUIS) 2.5 MG TABS TABLET    Take 1 tablet (2.5 mg total) by mouth 2 (two) times daily.   CHOLECALCIFEROL (VITAMIN D3) 2000 UNITS TABS    Take 2,000 Units by mouth daily.    DOCUSATE SODIUM (COLACE) 100 MG CAPSULE    Take 100 mg by mouth 2 (two) times daily.    DONEPEZIL (ARICEPT) 10 MG TABLET    Take 1 tablet (10 mg  total) by mouth daily.   FUROSEMIDE (LASIX) 40 MG TABLET    Take 1 tablet (40 mg total) by mouth daily.   LOSARTAN (COZAAR) 25 MG TABLET    Take 1 tablet (25 mg total) by mouth daily.   METOPROLOL TARTRATE (LOPRESSOR) 50 MG TABLET    Take 50 mg by mouth 2 (two) times daily.   PANTOPRAZOLE (PROTONIX) 40 MG TABLET    Take 1 tablet (40 mg total) by mouth daily.   VENLAFAXINE (EFFEXOR) 75 MG TABLET    Take 1 tablet (75 mg total) 2 (two) times daily by mouth.  Modified Medications   No medications on file  Discontinued Medications   FUROSEMIDE (LASIX) 20 MG TABLET    Take 1 tablet (20 mg total) by mouth daily as needed (edema, weight gain of 3 lbs or shortness of breath).     Physical Exam:  Vitals:  12/22/17 1027  BP: 136/82  Pulse: 61  Resp: 17  Temp: 98.4 F (36.9 C)  TempSrc: Oral  SpO2: 95%  Weight: 123 lb 6.4 oz (56 kg)  Height: 5' 2" (1.575 m)   Body mass index is 22.57 kg/m.  Physical Exam  Constitutional: She is oriented to person, place, and time. She appears well-developed. No distress.  HENT:  Head: Normocephalic and atraumatic.  Eyes: Pupils are equal, round, and reactive to light.  Neck: Normal range of motion.  Cardiovascular: Normal rate, normal heart sounds and intact distal pulses. An irregular rhythm present.  Pulmonary/Chest: Effort normal and breath sounds normal. She has no rales.  Musculoskeletal: Normal range of motion.  Unsteady gait, holds onto her daughter rather than using assistive device  Neurological: She is alert and oriented to person, place, and time. She displays normal reflexes. Coordination normal.  Skin: Skin is warm and dry.  Psychiatric: She has a normal mood and affect.    Labs reviewed: Basic Metabolic Panel: Recent Labs    06/27/17 1208  07/11/17 0233  07/17/17 0244  12/13/17 1840 12/14/17 0732 12/15/17 0226 12/16/17 0633  NA 136   < > 131*   < > 133*   < >  --  129* 133* 132*  K 4.2   < > 4.1   < > 4.4   < >  --  4.4 4.2  4.2  CL 103   < > 100*   < > 96*   < >  --  97* 100* 99*  CO2 19*   < > 23   < > 24   < >  --  21* 23 24  GLUCOSE 104*   < > 113*   < > 94   < >  --  83 68 91  BUN 34*   < > 29*   < > 25*   < >  --  19 21* 18  CREATININE 1.23*   < > 1.70*   < > 1.33*   < >  --  1.28* 1.62* 1.54*  CALCIUM 9.0   < > 8.6*   < > 8.7*   < >  --  8.7* 8.1* 8.6*  MG  --   --  1.8  --  1.7  --   --  1.8  --   --   TSH 5.35*  --   --   --   --   --  8.725*  --   --   --    < > = values in this interval not displayed.   Liver Function Tests: Recent Labs    05/01/17 1129 12/03/17 1016  AST 17 28  ALT 11 22  ALKPHOS 87  --   BILITOT 0.6 1.1  PROT 6.8 7.1  ALBUMIN 3.6  --    Recent Labs    05/01/17 1119 12/03/17 1016  LIPASE 48 39  AMYLASE 46 36   No results for input(s): AMMONIA in the last 8760 hours. CBC: Recent Labs    10/20/17 1200 10/28/17 1105 12/03/17 1016  12/14/17 0732 12/15/17 0226 12/16/17 0633  WBC 5.6 5.7 6.1   < > 6.8 6.4 6.3  NEUTROABS 3,758 4,520 4,709  --   --   --   --   HGB 10.9* 10.6* 12.9   < > 10.9* 10.3* 11.6*  HCT 32.4* 32.0* 38.2   < > 34.3* 33.0* 37.4  MCV 85.7 86.5 87.6   < > 91.0  91.2 91.4  PLT 264 198 267   < > 220 221 255   < > = values in this interval not displayed.   Lipid Panel: No results for input(s): CHOL, HDL, LDLCALC, TRIG, CHOLHDL, LDLDIRECT in the last 8760 hours. TSH: Recent Labs    06/27/17 1208 12/13/17 1840  TSH 5.35* 8.725*   A1C: Lab Results  Component Value Date   HGBA1C 6.2 (H) 05/02/2016     Assessment/Plan .1. Acute on chronic diastolic CHF (congestive heart failure) (HCC) -euvolemic at this time after hospitalization and IV lasix and increase in oral lasix from 20 to 40 mg daily;  conts on lasix 40 mg daily. Will follow up labs today. May need potassium supplement  - BMP with eGFR  2. Essential hypertension -stable at this time, will cont current regimen.  3. Vascular dementia without behavioral disturbance -stable, no  significant changes to cognitive or functional status.  4. Weight loss -noted since hospitalization however discussed with daughter about fluid loss, reports appetite has improved in the last few days. Will cont to monitor this. Request appetite stimulate if appetite continues to decline  5. Shortness of breath Has significantly improved since hospitalization and increase in diuretic    6. Persistent atrial fibrillation (HCC) Rate controlled, conts on metoprolol 50 mg BID and eliquis for anticoagulation  Next appt: as scheduled, sooner if needed Floris Neuhaus K. Harle Battiest  [facility] [phone] 8 am - 5 pm) [phone] (after hours)

## 2017-12-25 DIAGNOSIS — R2681 Unsteadiness on feet: Secondary | ICD-10-CM | POA: Diagnosis not present

## 2017-12-25 DIAGNOSIS — M6281 Muscle weakness (generalized): Secondary | ICD-10-CM | POA: Diagnosis not present

## 2017-12-25 DIAGNOSIS — R278 Other lack of coordination: Secondary | ICD-10-CM | POA: Diagnosis not present

## 2017-12-25 DIAGNOSIS — R262 Difficulty in walking, not elsewhere classified: Secondary | ICD-10-CM | POA: Diagnosis not present

## 2017-12-29 ENCOUNTER — Telehealth: Payer: Self-pay | Admitting: *Deleted

## 2017-12-29 DIAGNOSIS — M6281 Muscle weakness (generalized): Secondary | ICD-10-CM | POA: Diagnosis not present

## 2017-12-29 DIAGNOSIS — R2681 Unsteadiness on feet: Secondary | ICD-10-CM | POA: Diagnosis not present

## 2017-12-29 DIAGNOSIS — R278 Other lack of coordination: Secondary | ICD-10-CM | POA: Diagnosis not present

## 2017-12-29 DIAGNOSIS — R262 Difficulty in walking, not elsewhere classified: Secondary | ICD-10-CM | POA: Diagnosis not present

## 2017-12-29 NOTE — Telephone Encounter (Signed)
Patient daughter, [name] called and stated that Patient is gaining fluid in her feet and ankles. Stated that patient is up 7lbs from yesterday. No SOB. Please advise.    Patient daughter called c/o swelling   1. Do you wear compression hose? No  2. Are you taking any fluid/water pills (check medication list)  Lasix 40mg  daily  3. Are you elevating your feet at rest? Not doing it like she should.   4. Have you increased your salt intake?  No  5. Any shortness of breath? No  6. Any chest pain? No chest pains  7. Are you having any swelling in your ankles or feet? yes  8. Any distortion of your abdomen? no  9. Any changes in medications? no  10.Have you noticed weight gain? Weight gain of 7 lbs  11. Check patient's chart for history of heart failure, chronic kidney disease, or cirrhosis of the liver.  Heart Failure and watching kidneys.   I will forward this information to your provider and call with response. If your symptoms persist or progress seek medical attention at your nearest urgent care or emergency room. Patient verbalized understanding.

## 2017-12-29 NOTE — Telephone Encounter (Signed)
[name], daughter notified and agreed. Seeing Cardiologist on Thursday.

## 2017-12-29 NOTE — Telephone Encounter (Signed)
Wow, it's hard to gain 7 lbs in one day.  Let's increase lasix to 40mg  po bid for 3 days.  She should be avoiding high sodium foods and must elevate her feet at rest.  She will need supervision to make sure she does this b/c she will not remember.

## 2017-12-30 DIAGNOSIS — R262 Difficulty in walking, not elsewhere classified: Secondary | ICD-10-CM | POA: Diagnosis not present

## 2017-12-30 DIAGNOSIS — R2681 Unsteadiness on feet: Secondary | ICD-10-CM | POA: Diagnosis not present

## 2017-12-30 DIAGNOSIS — M6281 Muscle weakness (generalized): Secondary | ICD-10-CM | POA: Diagnosis not present

## 2017-12-30 DIAGNOSIS — R278 Other lack of coordination: Secondary | ICD-10-CM | POA: Diagnosis not present

## 2018-01-01 ENCOUNTER — Ambulatory Visit (INDEPENDENT_AMBULATORY_CARE_PROVIDER_SITE_OTHER): Payer: Medicare Other | Admitting: Cardiovascular Disease

## 2018-01-01 ENCOUNTER — Encounter: Payer: Self-pay | Admitting: Cardiovascular Disease

## 2018-01-01 VITALS — BP 130/62 | HR 54 | Ht 62.0 in | Wt 124.0 lb

## 2018-01-01 DIAGNOSIS — M6281 Muscle weakness (generalized): Secondary | ICD-10-CM | POA: Diagnosis not present

## 2018-01-01 DIAGNOSIS — I4819 Other persistent atrial fibrillation: Secondary | ICD-10-CM

## 2018-01-01 DIAGNOSIS — K644 Residual hemorrhoidal skin tags: Secondary | ICD-10-CM

## 2018-01-01 DIAGNOSIS — R2681 Unsteadiness on feet: Secondary | ICD-10-CM | POA: Diagnosis not present

## 2018-01-01 DIAGNOSIS — I5033 Acute on chronic diastolic (congestive) heart failure: Secondary | ICD-10-CM

## 2018-01-01 DIAGNOSIS — R262 Difficulty in walking, not elsewhere classified: Secondary | ICD-10-CM | POA: Diagnosis not present

## 2018-01-01 DIAGNOSIS — R278 Other lack of coordination: Secondary | ICD-10-CM | POA: Diagnosis not present

## 2018-01-01 DIAGNOSIS — I495 Sick sinus syndrome: Secondary | ICD-10-CM

## 2018-01-01 DIAGNOSIS — I481 Persistent atrial fibrillation: Secondary | ICD-10-CM | POA: Diagnosis not present

## 2018-01-01 MED ORDER — FUROSEMIDE 40 MG PO TABS: ORAL_TABLET | ORAL | 3 refills | Status: DC

## 2018-01-01 NOTE — Progress Notes (Signed)
Patient ID: Brittney Gutierrez, female   DOB: June 06, 1927, 84 y.o.   MRN: [id number]    Cardiology Office Note    Date:  01/01/2018   ID:  Brittney Gutierrez, DOB [birth year], MRN [id number]  PCP:  Gayland Curry, DO  Cardiologist:   Sanda Klein, MD   Chief Complaint  Patient presents with  . 3 month f/u visit    pt daughter states pt has been having a lot of fluid gain and fluid on the lungs associated with SOB  Follow-up after hospital visit for heart failure exacerbation  History of Present Illness:  Brittney Gutierrez is a 84 y.o. female with chronic diastolic heart failure, systemic hypertension, chronic kidney disease stage III and problems with both sinus bradycardia and paroxysmal atrial fibrillation with rapid ventricular response. She has requested conservative management, without pacemaker implantation.  Brittney Gutierrez was hospitalized January 5-January 8 for heart failure exacerbation. She presented with acute hypoxic respiratory failure and improved with diuretics. She was in normal sinus rhythm on admission and remained in sinus rhythm throughout the hospitalization. She did not have angina and had a marginal increase in cardiac troponin at 0.03. She did have bilateral lower extremity edema, without any evidence of DVT on vascular ultrasound performed on January 6. Cardiology was not consulted during that admission.  After hospital discharge she developed some ankle edema and reportedly a 6 pound weight gain last weekend. She took a double dose of furosemide for 3 days with improvement in the edema. She does not have any swelling today and denies any dyspnea either at rest or with activity.  Discharge weight on the hospital scale was 124 pounds. She weighs the same amount in our office today, but the scale at [facility] showed 128 pounds this morning. Her daughter does not remember how much she weighed when she came back from the hospital on that scale.  Labs performed on the  current dose of furosemide, and generally 14, showed a potassium of 3.7 and creatinine around 1.5, her baseline.  The patient specifically denies any chest pain at rest exertion, dyspnea at rest or with exertion, orthopnea, paroxysmal nocturnal dyspnea, syncope, palpitations, focal neurological deficits, intermittent claudication, lower extremity edema, unexplained weight gain, cough, hemoptysis or wheezing.  She has echo evidence of hypertensive heart disease with left ventricular hypertrophy but has not had overt congestive heart failure. She had minor coronary atherosclerosis at cath. In May 2017 her LDL was 120 She has a questionable hx of TIA, she has a CHA2DS2Vasc score of at least 4 (if + TIA would be 6). Myoview in Sept 2016 showed no ischemia. Cardiac catheterization May 2017 showed minor plaque in all 3 coronary arteries without significant coronary stenoses. During that same study she had bilateral renal angiography showing mild to moderate right renal artery stenosis. Echo August 2018 showed preserved LVF, but diastolic function could not be analyzed due to atrial fibrillation. The report was of mild mitral regurgitation. Left atrium was moderately dilated.. TEE in August 2018 showed moderate mitral regurgitation. Amiodarone was used briefly but was discontinued due to itching. Simultaneous treatment with metoprolol and diltiazem was associated with significant bradycardia.   Past Medical History:  Diagnosis Date  . Abnormal weight loss 05/28/2004  . Anxiety 01/17/2003  . Back pain 08/19/2005   with radiculopathy  . Bradycardia, drug induced    Diltiazem  . Cerebral atherosclerosis 09/24/1999  . Chest pain, atypical 04/2016   minor CAD at cath   . CHF (congestive heart failure) (  HCC)   . Chronic anticoagulation    Eliquis  . Dementia    "don't know kind or stage" (05/02/2016)  . Depression 09/06/2002  . Diverticulitis 09/28/2007  . External hemorrhoids 04/12/2009  . Female climacteric  state 03/04/2000  . 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 GERD (gastroesophageal reflux disease)   . Hypertensive cardiovascular disease    moderate LVH on echo march 2017  . Macular degeneration   . Malaise and fatigue   . Neurocysticercosis    Craniotomy at [facility] in early 2000s  . Osteoarthrosis, localized   . Osteoporosis 05/28/2004  . Paranoia ([place])   . Paroxysmal A-fib ([place])   . TIA (transient ischemic attack) 2000s?  [name] Urinary incontinence 08/18/2007  . UTI (urinary tract infection)   . Vaginitis, atrophic   . Vertigo, peripheral 10/07/2000    Past Surgical History:  Procedure Laterality Date  . BRAIN SURGERY  2000   Neurocysticercosis ("parasite")  . CARDIAC CATHETERIZATION N/A 05/03/2016   Procedure: Left Heart Cath and Coronary Angiography;  Surgeon: Sherren Mocha, MD;  Location: [facility];  Service: Cardiovascular;  Laterality: N/A;  . CARDIOVERSION N/A 07/14/2017   Procedure: CARDIOVERSION;  Surgeon: Pixie Casino, MD;  Location: [facility] ENDOSCOPY;  Service: Cardiovascular;  Laterality: N/A;  . CATARACT EXTRACTION  2013  . CATARACT EXTRACTION, BILATERAL     "not sure if both eyes; feel like it probably was"  . JOINT REPLACEMENT    . PERIPHERAL VASCULAR CATHETERIZATION N/A 05/03/2016   Procedure: Abdominal Aortogram;  Surgeon: Sherren Mocha, MD;  Location: [place];  Service: Cardiovascular;  Laterality: N/A;  . SHOULDER ARTHROSCOPY W/ ROTATOR CUFF REPAIR Right X 3  . TEE WITHOUT CARDIOVERSION N/A 07/14/2017   Procedure: TRANSESOPHAGEAL ECHOCARDIOGRAM (TEE);  Surgeon: Pixie Casino, MD;  Location: [facility];  Service: Cardiovascular;  Laterality: N/A;  . TOTAL KNEE ARTHROPLASTY Left 1990  . VAGINAL HYSTERECTOMY  1960    Current Medications: Outpatient Medications Prior to Visit  Medication Sig Dispense Refill  . acetaminophen (TYLENOL) 325 MG tablet Take 650 mg by mouth every 6 (six) hours as  needed for mild pain.    [name] amLODipine (NORVASC) 10 MG tablet Take 1 tablet (10 mg total) by mouth daily. 90 tablet 1  . apixaban (ELIQUIS) 2.5 MG TABS tablet Take 1 tablet (2.5 mg total) by mouth 2 (two) times daily. 180 tablet 3  . Cholecalciferol (VITAMIN D3) 2000 units TABS Take 2,000 Units by mouth daily.     [name] docusate sodium (COLACE) 100 MG capsule Take 100 mg by mouth 2 (two) times daily.     [name] donepezil (ARICEPT) 10 MG tablet Take 1 tablet (10 mg total) by mouth daily. 90 tablet 3  . furosemide (LASIX) 40 MG tablet Take 1 tablet (40 mg total) by mouth daily. 30 tablet 0  . losartan (COZAAR) 25 MG tablet Take 1 tablet (25 mg total) by mouth daily. 90 tablet 3  . metoprolol tartrate (LOPRESSOR) 50 MG tablet Take 50 mg by mouth 2 (two) times daily.    . pantoprazole (PROTONIX) 40 MG tablet Take 1 tablet (40 mg total) by mouth daily. 30 tablet 3  . venlafaxine (EFFEXOR) 75 MG tablet Take 1 tablet (75 mg total) 2 (two) times daily by mouth. 180 tablet 3   No facility-administered medications prior to visit.      Allergies:   Bactrim [sulfamethoxazole-trimethoprim]; Amiodarone; Amoxicillin; Demerol [meperidine]; Hydralazine; Lisinopril;  Zoloft [sertraline hcl]; and Tape   Social History   Socioeconomic History  . Marital status: Widowed    Spouse name: None  . Number of children: None  . Years of education: None  . Highest education level: None  Social Needs  . Financial resource strain: None  . Food insecurity - worry: None  . Food insecurity - inability: None  . Transportation needs - medical: None  . Transportation needs - non-medical: None  Occupational History  . None  Tobacco Use  . Smoking status: Never Smoker  . Smokeless tobacco: Never Used  Substance and Sexual Activity  . Alcohol use: No    Alcohol/week: 0.0 oz  . Drug use: No  . Sexual activity: No  Other Topics Concern  . None  Social History Narrative   DIET: None      DO YOU DRINK/EAT THINGS WITH  CAFFEINE: yes      MARITAL STATUS: widow      WHAT YEAR WERE YOU MARRIED: 1948      DO YOU LIVE IN A HOUSE, APARTMENT, ASSISTED LIVING, CONDO TRAILER ETC.: Independent Living      IS IT ONE OR MORE STORIES: 1 story      HOW MANY PERSONS LIVE IN YOUR HOME: 1      DO YOU HAVE PETS IN YOUR HOME: no      CURRENT OR PAST PROFESSION: Book Keeper      DO YOU EXERCISE: no      WHAT TYPE AND HOW OFTEN:     Family History:  The patient's family history includes Heart attack in her father; Hypertension in her sister; Polycystic kidney disease in her mother.   ROS:   Please see the history of present illness.    ROS All other systems reviewed and are negative.   PHYSICAL EXAM:   VS:  BP 130/62 (BP Location: Left Arm, Patient Position: Sitting, Cuff Size: Normal)   Pulse (!) 54   Ht 5\' 2"  (1.575 m)   Wt 124 lb (56.2 kg)   BMI 22.68 kg/m     General: Alert, oriented x3, no distress Head: no evidence of trauma, PERRL, EOMI, no exophtalmos or lid lag, no myxedema, no xanthelasma; normal ears, nose and oropharynx Neck: normal jugular venous pulsations and no hepatojugular reflux; brisk carotid pulses without delay and no carotid bruits Chest: clear to auscultation, no signs of consolidation by percussion or palpation, normal fremitus, symmetrical and full respiratory excursions Cardiovascular: normal position and quality of the apical impulse, regular rhythm, normal first and second heart sounds, no murmurs, rubs or gallops Abdomen: no tenderness or distention, no masses by palpation, no abnormal pulsatility or arterial bruits, normal bowel sounds, no hepatosplenomegaly Extremities: no clubbing, cyanosis or edema; 2+ radial, ulnar and brachial pulses bilaterally; 2+ right femoral, posterior tibial and dorsalis pedis pulses; 2+ left femoral, posterior tibial and dorsalis pedis pulses; no subclavian or femoral bruits Neurological: grossly nonfocal Psych: euthymic mood, full affect  Wt  Readings from Last 3 Encounters:  01/01/18 124 lb (56.2 kg)  12/22/17 123 lb 6.4 oz (56 kg)  12/16/17 124 lb 3.2 oz (56.3 kg)    Studies/Labs Reviewed:   EKG:  EKG is not ordered today.  Recent Labs: 12/03/2017: ALT 22 12/13/2017: B Natriuretic Peptide 1,190.9; TSH 8.725 12/14/2017: Magnesium 1.8 12/16/2017: Hemoglobin 11.6; Platelets 255 12/22/2017: BUN 22; Creat 1.50; Potassium 3.7; Sodium 135   Lipid Panel    Component Value Date/Time   CHOL 195 05/02/2016 0732  TRIG 49 05/02/2016 0732   HDL 65 05/02/2016 0732   CHOLHDL 3.0 05/02/2016 0732   VLDL 10 05/02/2016 0732   LDLCALC 120 (H) 05/02/2016 0732    ASSESSMENT:    1. Acute on chronic diastolic CHF (congestive heart failure) (HCC)   2. Persistent atrial fibrillation ([place])   3. External hemorrhoids   4. SSS (sick sinus syndrome) (HCC)      PLAN:  In order of problems listed above:  1. CHF: She appears to be euvolemic today and has good functional status. There seems to be a very narrow margin between heart failure exacerbation and renal dysfunction. Will try to use a sliding scale furosemide prescription. Will maintain on a 40 mg furosemide dose daily, with adjustment for weight increase. Her home scale seems to overestimate the office scale by about 4 pounds. We'll set initial target of a maximum weight of 130 pounds on her home scale, above which she will take a double dose of furosemide. May have to adjust that target "dry weight" depending on clinical evolution. 2. AFib: No clinical recurrence. Avoiding antiarrhythmics or high dose of rate control medications, since she wants to avoid pacemaker implantation. Continue anticoagulation as long as safe. 3. HTN: Good control on the current medical regimen. No changes made 4. SSS: She would like to avoid pacemaker implantation unless absolutely necessary. She is quite sedentary and does not appear to have any symptoms related to her bradycardia    Medication Adjustments/Labs  and Tests Ordered: Current medicines are reviewed at length with the patient today.  Concerns regarding medicines are outlined above.  Medication changes, Labs and Tests ordered today are listed in the Patient Instructions below. There are no Patient Instructions on file for this visit.   Signed, Sanda Klein, MD  01/01/2018 2:20 PM    [facility] [address], [place], [place]  [address] Phone: [phone]; Fax: [phone]

## 2018-01-01 NOTE — Patient Instructions (Signed)
Dr Sallyanne Kuster has recommended making the following medication changes: 1. CHANGE the way you take your Furosemide  When you weigh 129 pounds or less - take 40 mg  When you weigh 130 pounds or more - take 80 mg  Your physician recommends that you weigh, daily, at the same time every day, and in the same amount of clothing.  Dr Sallyanne Kuster recommends that you schedule a follow-up appointment in 6 months. You will receive a reminder letter in the mail two months in advance. If you don't receive a letter, please call our office to schedule the follow-up appointment.  If you need a refill on your cardiac medications before your next appointment, please call your pharmacy.

## 2018-01-02 DIAGNOSIS — R262 Difficulty in walking, not elsewhere classified: Secondary | ICD-10-CM | POA: Diagnosis not present

## 2018-01-02 DIAGNOSIS — R2681 Unsteadiness on feet: Secondary | ICD-10-CM | POA: Diagnosis not present

## 2018-01-02 DIAGNOSIS — M6281 Muscle weakness (generalized): Secondary | ICD-10-CM | POA: Diagnosis not present

## 2018-01-02 DIAGNOSIS — R278 Other lack of coordination: Secondary | ICD-10-CM | POA: Diagnosis not present

## 2018-01-06 DIAGNOSIS — R278 Other lack of coordination: Secondary | ICD-10-CM | POA: Diagnosis not present

## 2018-01-06 DIAGNOSIS — M6281 Muscle weakness (generalized): Secondary | ICD-10-CM | POA: Diagnosis not present

## 2018-01-06 DIAGNOSIS — R2681 Unsteadiness on feet: Secondary | ICD-10-CM | POA: Diagnosis not present

## 2018-01-06 DIAGNOSIS — R262 Difficulty in walking, not elsewhere classified: Secondary | ICD-10-CM | POA: Diagnosis not present

## 2018-01-07 DIAGNOSIS — M6281 Muscle weakness (generalized): Secondary | ICD-10-CM | POA: Diagnosis not present

## 2018-01-07 DIAGNOSIS — R2681 Unsteadiness on feet: Secondary | ICD-10-CM | POA: Diagnosis not present

## 2018-01-07 DIAGNOSIS — R262 Difficulty in walking, not elsewhere classified: Secondary | ICD-10-CM | POA: Diagnosis not present

## 2018-01-07 DIAGNOSIS — R278 Other lack of coordination: Secondary | ICD-10-CM | POA: Diagnosis not present

## 2018-01-08 DIAGNOSIS — R278 Other lack of coordination: Secondary | ICD-10-CM | POA: Diagnosis not present

## 2018-01-08 DIAGNOSIS — M6281 Muscle weakness (generalized): Secondary | ICD-10-CM | POA: Diagnosis not present

## 2018-01-08 DIAGNOSIS — R2681 Unsteadiness on feet: Secondary | ICD-10-CM | POA: Diagnosis not present

## 2018-01-08 DIAGNOSIS — R262 Difficulty in walking, not elsewhere classified: Secondary | ICD-10-CM | POA: Diagnosis not present

## 2018-01-12 ENCOUNTER — Telehealth: Payer: Self-pay | Admitting: *Deleted

## 2018-01-12 NOTE — Telephone Encounter (Signed)
1.Patient daughter, Alveda Reasons called and stated that patient is taking Lasix and cannot hold her urine any length of time. Patient is staying in her room due to this. Daughter is wondering if the Lasix could be cut in half.   2.Also stated that patient has a cough is productive, clear. Had for several days. No fever. Wants to know if there is something she can take for this. Please Advise.

## 2018-01-12 NOTE — Telephone Encounter (Signed)
Pt may use robitussin cough suppressant over the counter for the cough.   What is her weight trend?  I doubt the lasix can be reduced b/c when we've done that before, she's accumulated fluid.  Unfortunately, this is a necessary medication for her.

## 2018-01-13 NOTE — Telephone Encounter (Signed)
Patient daughter notified and agreed. Daughter stated that patient's weight is trending down. Patient is at 121lb as of yesterday.

## 2018-01-19 DIAGNOSIS — R278 Other lack of coordination: Secondary | ICD-10-CM | POA: Diagnosis not present

## 2018-01-19 DIAGNOSIS — R2681 Unsteadiness on feet: Secondary | ICD-10-CM | POA: Diagnosis not present

## 2018-01-19 DIAGNOSIS — R262 Difficulty in walking, not elsewhere classified: Secondary | ICD-10-CM | POA: Diagnosis not present

## 2018-01-19 DIAGNOSIS — M6281 Muscle weakness (generalized): Secondary | ICD-10-CM | POA: Diagnosis not present

## 2018-01-20 DIAGNOSIS — M6281 Muscle weakness (generalized): Secondary | ICD-10-CM | POA: Diagnosis not present

## 2018-01-20 DIAGNOSIS — R2681 Unsteadiness on feet: Secondary | ICD-10-CM | POA: Diagnosis not present

## 2018-01-20 DIAGNOSIS — R262 Difficulty in walking, not elsewhere classified: Secondary | ICD-10-CM | POA: Diagnosis not present

## 2018-01-20 DIAGNOSIS — R278 Other lack of coordination: Secondary | ICD-10-CM | POA: Diagnosis not present

## 2018-01-27 DIAGNOSIS — R262 Difficulty in walking, not elsewhere classified: Secondary | ICD-10-CM | POA: Diagnosis not present

## 2018-01-27 DIAGNOSIS — M6281 Muscle weakness (generalized): Secondary | ICD-10-CM | POA: Diagnosis not present

## 2018-01-27 DIAGNOSIS — R278 Other lack of coordination: Secondary | ICD-10-CM | POA: Diagnosis not present

## 2018-01-27 DIAGNOSIS — R2681 Unsteadiness on feet: Secondary | ICD-10-CM | POA: Diagnosis not present

## 2018-01-29 DIAGNOSIS — M6281 Muscle weakness (generalized): Secondary | ICD-10-CM | POA: Diagnosis not present

## 2018-01-29 DIAGNOSIS — R2681 Unsteadiness on feet: Secondary | ICD-10-CM | POA: Diagnosis not present

## 2018-01-29 DIAGNOSIS — R278 Other lack of coordination: Secondary | ICD-10-CM | POA: Diagnosis not present

## 2018-01-29 DIAGNOSIS — R262 Difficulty in walking, not elsewhere classified: Secondary | ICD-10-CM | POA: Diagnosis not present

## 2018-01-30 DIAGNOSIS — R2681 Unsteadiness on feet: Secondary | ICD-10-CM | POA: Diagnosis not present

## 2018-01-30 DIAGNOSIS — M6281 Muscle weakness (generalized): Secondary | ICD-10-CM | POA: Diagnosis not present

## 2018-01-30 DIAGNOSIS — R262 Difficulty in walking, not elsewhere classified: Secondary | ICD-10-CM | POA: Diagnosis not present

## 2018-01-30 DIAGNOSIS — R278 Other lack of coordination: Secondary | ICD-10-CM | POA: Diagnosis not present

## 2018-02-10 DIAGNOSIS — R278 Other lack of coordination: Secondary | ICD-10-CM | POA: Diagnosis not present

## 2018-02-10 DIAGNOSIS — M6281 Muscle weakness (generalized): Secondary | ICD-10-CM | POA: Diagnosis not present

## 2018-02-11 ENCOUNTER — Encounter: Payer: Self-pay | Admitting: Internal Medicine

## 2018-02-11 DIAGNOSIS — H353213 Exudative age-related macular degeneration, right eye, with inactive scar: Secondary | ICD-10-CM | POA: Diagnosis not present

## 2018-02-11 DIAGNOSIS — H5316 Psychophysical visual disturbances: Secondary | ICD-10-CM | POA: Diagnosis not present

## 2018-02-11 DIAGNOSIS — H353222 Exudative age-related macular degeneration, left eye, with inactive choroidal neovascularization: Secondary | ICD-10-CM | POA: Diagnosis not present

## 2018-02-11 DIAGNOSIS — H43813 Vitreous degeneration, bilateral: Secondary | ICD-10-CM | POA: Diagnosis not present

## 2018-02-11 LAB — HM DIABETES EYE EXAM

## 2018-02-12 DIAGNOSIS — R278 Other lack of coordination: Secondary | ICD-10-CM | POA: Diagnosis not present

## 2018-02-12 DIAGNOSIS — M6281 Muscle weakness (generalized): Secondary | ICD-10-CM | POA: Diagnosis not present

## 2018-02-26 ENCOUNTER — Other Ambulatory Visit: Payer: Medicare Other

## 2018-02-26 DIAGNOSIS — D500 Iron deficiency anemia secondary to blood loss (chronic): Secondary | ICD-10-CM | POA: Diagnosis not present

## 2018-02-27 LAB — CBC WITH DIFFERENTIAL/PLATELET
Basophils Absolute: 49 cells/uL (ref 0–200)
Basophils Relative: 0.9 %
Eosinophils Absolute: 167 cells/uL (ref 15–500)
Eosinophils Relative: 3.1 %
HCT: 34.2 % — ABNORMAL LOW (ref 35.0–45.0)
Hemoglobin: 11.3 g/dL — ABNORMAL LOW (ref 11.7–15.5)
Lymphs Abs: 691 cells/uL — ABNORMAL LOW (ref 850–3900)
MCH: 29.7 pg (ref 27.0–33.0)
MCHC: 33 g/dL (ref 32.0–36.0)
MCV: 89.8 fL (ref 80.0–100.0)
MPV: 10.8 fL (ref 7.5–12.5)
Monocytes Relative: 11.3 %
Neutro Abs: 3883 cells/uL (ref 1500–7800)
Neutrophils Relative %: 71.9 %
Platelets: 287 10*3/uL (ref 140–400)
RBC: 3.81 10*6/uL (ref 3.80–5.10)
RDW: 14 % (ref 11.0–15.0)
Total Lymphocyte: 12.8 %
WBC mixed population: 610 cells/uL (ref 200–950)
WBC: 5.4 10*3/uL (ref 3.8–10.8)

## 2018-02-27 LAB — IRON,TIBC AND FERRITIN PANEL
%SAT: 24 % (calc) (ref 11–50)
Ferritin: 72 ng/mL (ref 20–288)
Iron: 94 ug/dL (ref 45–160)
TIBC: 398 mcg/dL (calc) (ref 250–450)

## 2018-03-02 ENCOUNTER — Encounter: Payer: Self-pay | Admitting: Internal Medicine

## 2018-03-02 ENCOUNTER — Ambulatory Visit (INDEPENDENT_AMBULATORY_CARE_PROVIDER_SITE_OTHER): Payer: Medicare Other | Admitting: Internal Medicine

## 2018-03-02 VITALS — BP 130/68 | HR 56 | Temp 97.5°F | Ht 62.0 in | Wt 125.0 lb

## 2018-03-02 DIAGNOSIS — D649 Anemia, unspecified: Secondary | ICD-10-CM | POA: Diagnosis not present

## 2018-03-02 DIAGNOSIS — I481 Persistent atrial fibrillation: Secondary | ICD-10-CM | POA: Diagnosis not present

## 2018-03-02 DIAGNOSIS — M25551 Pain in right hip: Secondary | ICD-10-CM | POA: Diagnosis not present

## 2018-03-02 DIAGNOSIS — K219 Gastro-esophageal reflux disease without esophagitis: Secondary | ICD-10-CM

## 2018-03-02 DIAGNOSIS — R630 Anorexia: Secondary | ICD-10-CM

## 2018-03-02 DIAGNOSIS — Z7901 Long term (current) use of anticoagulants: Secondary | ICD-10-CM | POA: Diagnosis not present

## 2018-03-02 DIAGNOSIS — M25552 Pain in left hip: Secondary | ICD-10-CM | POA: Diagnosis not present

## 2018-03-02 DIAGNOSIS — I4819 Other persistent atrial fibrillation: Secondary | ICD-10-CM

## 2018-03-02 DIAGNOSIS — F0150 Vascular dementia without behavioral disturbance: Secondary | ICD-10-CM | POA: Diagnosis not present

## 2018-03-02 MED ORDER — PANTOPRAZOLE SODIUM 40 MG PO TBEC: 40.0000 mg | DELAYED_RELEASE_TABLET | Freq: Every day | ORAL | 3 refills | Status: DC

## 2018-03-02 NOTE — Patient Instructions (Addendum)
Start taking Protonix give at least an hour before eating. Drink water. Work to improve supplement use and food intake to include anything you desire.   Start Tylenol arthritis (3-4 days regularly, then change to as needed). Use heating as needed.

## 2018-03-02 NOTE — Progress Notes (Signed)
Location:  [facility] clinic Provider:  Tiffany L. Mariea Clonts, D.O., C.M.D.  Code Status: Partial Code  Goals of Care:  Advanced Directives 03/02/2018  Does Patient Have a Medical Advance Directive? Yes  Type of Paramedic of [place];Living will  Does patient want to make changes to medical advance directive? -  Copy of [place] in Chart? No - copy requested  Would patient like information on creating a medical advance directive? -     Chief Complaint  Patient presents with  . Medical Management of Chronic Issues    4 month follow-up, vomiting episodes x 1 week (mainly in the am) (last episode this am)and ongoing bilateral hip pain. Here with daughter [name]   . Advance Care Planning    Patient has living will and HPOA, copies requested   . Medication Refill    Refill Protonix if patient to continue     HPI: Patient is a 84 y.o. female seen today for medical management of chronic diseases.    Nausea off and on over the last week or so. Mostly occurs in the morning. She feels like she could vomit, it runs up into her esophagus but she never vomits. She has a poor appetite. She does drink supplements like ensure. She is suppose to have snacks at night. She reports drinking more water recently.   Bilateral hip pain she takes tylenol for pain, but she reports this is not helping her as much. Delay in getting up from chair. She reports walking more.  Past Medical History:  Diagnosis Date  . Abnormal weight loss 05/28/2004  . Anxiety 01/17/2003  . Back pain 08/19/2005   with radiculopathy  . Bradycardia, drug induced    Diltiazem  . Cerebral atherosclerosis 09/24/1999  . Chest pain, atypical 04/2016   minor CAD at cath   . CHF (congestive heart failure) ([place])   . Chronic anticoagulation    Eliquis  . Dementia    "don't know kind or stage" (05/02/2016)  . Depression 09/06/2002  . Diverticulitis 09/28/2007  . External hemorrhoids 04/12/2009  . Female  climacteric state 03/04/2000  . 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 GERD (gastroesophageal reflux disease)   . Hypertensive cardiovascular disease    moderate LVH on echo march 2017  . Macular degeneration   . Malaise and fatigue   . 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 Osteoarthrosis, localized   . Osteoporosis 05/28/2004  . Paranoia ([place])   . Paroxysmal A-fib ([place])   . TIA (transient ischemic attack) 2000s?  [name] Urinary incontinence 08/18/2007  . UTI (urinary tract infection)   . Vaginitis, atrophic   . Vertigo, peripheral 10/07/2000    Past Surgical History:  Procedure Laterality Date  . BRAIN SURGERY  2000   Neurocysticercosis ("parasite")  . CARDIAC CATHETERIZATION N/A 05/03/2016   Procedure: Left Heart Cath and Coronary Angiography;  Surgeon: Sherren Mocha, MD;  Location: [place] CV LAB;  Service: Cardiovascular;  Laterality: N/A;  . CARDIOVERSION N/A 07/14/2017   Procedure: CARDIOVERSION;  Surgeon: Pixie Casino, MD;  Location: [facility] ENDOSCOPY;  Service: Cardiovascular;  Laterality: N/A;  . CATARACT EXTRACTION  2013  . CATARACT EXTRACTION, BILATERAL     "not sure if both eyes; feel like it probably was"  . JOINT REPLACEMENT    . PERIPHERAL VASCULAR CATHETERIZATION N/A 05/03/2016   Procedure: Abdominal Aortogram;  Surgeon: Sherren Mocha, MD;  Location: [place]  CV LAB;  Service: Cardiovascular;  Laterality: N/A;  . SHOULDER ARTHROSCOPY W/ ROTATOR CUFF REPAIR Right X 3  . TEE WITHOUT CARDIOVERSION N/A 07/14/2017   Procedure: TRANSESOPHAGEAL ECHOCARDIOGRAM (TEE);  Surgeon: Pixie Casino, MD;  Location: [place];  Service: Cardiovascular;  Laterality: N/A;  . TOTAL KNEE ARTHROPLASTY Left 1990  . VAGINAL HYSTERECTOMY  1960    Allergies  Allergen Reactions  . Bactrim [Sulfamethoxazole-Trimethoprim] Nausea Only  . Amiodarone Itching  . Amoxicillin Other (See Comments)    Unknown- ? Upset  stomach  . Demerol [Meperidine] Other (See Comments)    Hallucinations  . Hydralazine Other (See Comments)    Tingling and chest pain   . Lisinopril Cough  . Zoloft [Sertraline Hcl] Other (See Comments)    Unknown  . Tape Rash and Other (See Comments)    Can tear the skin, also    Outpatient Encounter Medications as of 03/02/2018  Medication Sig  . acetaminophen (TYLENOL) 325 MG tablet Take 650 mg by mouth every 6 (six) hours as needed for mild pain.  [name] amLODipine (NORVASC) 10 MG tablet Take 1 tablet (10 mg total) by mouth daily.  [name] apixaban (ELIQUIS) 2.5 MG TABS tablet Take 1 tablet (2.5 mg total) by mouth 2 (two) times daily.  . Cholecalciferol (VITAMIN D3) 2000 units TABS Take 2,000 Units by mouth daily.   [name] docusate sodium (COLACE) 100 MG capsule Take 100 mg by mouth 2 (two) times daily.   [name] donepezil (ARICEPT) 10 MG tablet Take 1 tablet (10 mg total) by mouth daily.  . furosemide (LASIX) 40 MG tablet Take 1-2 tablets (40-80 mg total) by mouth daily as directed.  [name] losartan (COZAAR) 25 MG tablet Take 1 tablet (25 mg total) by mouth daily.  . metoprolol tartrate (LOPRESSOR) 50 MG tablet Take 50 mg by mouth 2 (two) times daily.  . pantoprazole (PROTONIX) 40 MG tablet Take 1 tablet (40 mg total) by mouth daily.  [name] venlafaxine (EFFEXOR) 75 MG tablet Take 1 tablet (75 mg total) 2 (two) times daily by mouth.   No facility-administered encounter medications on file as of 03/02/2018.     Review of Systems:  Review of Systems  Constitutional: Negative for chills, fever and malaise/fatigue.  Eyes:       Glasses  Respiratory: Negative for cough and shortness of breath.   Cardiovascular: Negative for chest pain, palpitations and leg swelling.  Gastrointestinal: Positive for nausea. Negative for heartburn and vomiting.  Musculoskeletal: Positive for back pain and joint pain.    Health Maintenance  Topic Date Due  . TETANUS/TDAP  09/08/2022  . INFLUENZA VACCINE  Completed  . DEXA SCAN   Completed  . PNA vac Low Risk Adult  Completed    Physical Exam: Vitals:   03/02/18 0938  BP: 130/68  Pulse: (!) 56  Temp: (!) 97.5 F (36.4 C)  TempSrc: Oral  SpO2: 96%  Weight: 125 lb (56.7 kg)  Height: 5\' 2"  (1.575 m)   Body mass index is 22.86 kg/m. Physical Exam  Constitutional: She appears well-developed and well-nourished.  Cardiovascular: Intact distal pulses and normal pulses. An irregularly irregular rhythm present.  Pulmonary/Chest: Effort normal and breath sounds normal.  Musculoskeletal: Normal range of motion. She exhibits edema.  Neurological: She is alert.  Skin: Skin is warm and dry.  Psychiatric: She has a normal mood and affect. Her behavior is normal. Judgment and thought content normal.  Vitals reviewed.   Labs reviewed: Basic Metabolic Panel: Recent  Labs    06/27/17 1208  07/11/17 0233  07/17/17 0244  12/13/17 1840 12/14/17 0732 12/15/17 0226 12/16/17 0633 12/22/17 1059  NA 136   < > 131*   < > 133*   < >  --  129* 133* 132* 135  K 4.2   < > 4.1   < > 4.4   < >  --  4.4 4.2 4.2 3.7  CL 103   < > 100*   < > 96*   < >  --  97* 100* 99* 99  CO2 19*   < > 23   < > 24   < >  --  21* 23 24 27   GLUCOSE 104*   < > 113*   < > 94   < >  --  83 68 91 115  BUN 34*   < > 29*   < > 25*   < >  --  19 21* 18 22  CREATININE 1.23*   < > 1.70*   < > 1.33*   < >  --  1.28* 1.62* 1.54* 1.50*  CALCIUM 9.0   < > 8.6*   < > 8.7*   < >  --  8.7* 8.1* 8.6* 9.2  MG  --   --  1.8  --  1.7  --   --  1.8  --   --   --   TSH 5.35*  --   --   --   --   --  8.725*  --   --   --   --    < > = values in this interval not displayed.   Liver Function Tests: Recent Labs    05/01/17 1129 12/03/17 1016  AST 17 28  ALT 11 22  ALKPHOS 87  --   BILITOT 0.6 1.1  PROT 6.8 7.1  ALBUMIN 3.6  --    Recent Labs    05/01/17 1119 12/03/17 1016  LIPASE 48 39  AMYLASE 46 36   No results for input(s): AMMONIA in the last 8760 hours. CBC: Recent Labs    10/28/17 1105  12/03/17 1016  12/15/17 0226 12/16/17 0633 02/26/18 1042  WBC 5.7 6.1   < > 6.4 6.3 5.4  NEUTROABS 4,520 4,709  --   --   --  3,883  HGB 10.6* 12.9   < > 10.3* 11.6* 11.3*  HCT 32.0* 38.2   < > 33.0* 37.4 34.2*  MCV 86.5 87.6   < > 91.2 91.4 89.8  PLT 198 267   < > 221 255 287   < > = values in this interval not displayed.   Lipid Panel: No results for input(s): CHOL, HDL, LDLCALC, TRIG, CHOLHDL, LDLDIRECT in the last 8760 hours. Lab Results  Component Value Date   HGBA1C 6.2 (H) 05/02/2016    Procedures since last visit: No results found.  Assessment/Plan 1. Decreased appetite She has gained two pounds since last visit. She still reports no appetite. Encouraged the use of supplements when she does not eat well.   - pantoprazole (PROTONIX) 40 MG tablet; Take 1 tablet (40 mg total) by mouth daily.  Dispense: 30 tablet; Refill: 3  2. Gastroesophageal reflux disease, esophagitis presence not specified She continues to have reflux symptoms in the morning. She was not taking her  Protonix without food and so the full benefit has not been experienced. Advise to take one hour prior to breakfast.  - pantoprazole (PROTONIX) 40 MG  tablet; Take 1 tablet (40 mg total) by mouth daily.  Dispense: 30 tablet; Refill: 3  3. Bilateral hip pain She has been to Ortho and she continues to have bilateral hip pain. Advised to take tylenol arthritis to have more extended pain relief.   4. Persistent atrial fibrillation ([place]) She is in a fib today in the office controlled rate and without signs or symptoms of stroke or clot. She is on lopressor for rate control. Will continue this.  5. Vascular dementia without behavioral disturbance She does have some communication delays and trouble finding words.  But overall doing well with behavior. She is on Aricept, will continue this therapy.  6. Current use of long term anticoagulation Due to persistent a. Fib. She is maintain on Eliquis. She is  without bleeding or signs of bleeding. She has some easier bruising.   7. Anemia, unspecified type She is 11.3 on last check and this is stable with the last several checks.  Continue to encourage eating a well balanced diet and supplement as needed.  Last iron check was on the lower end, but she was within range.    Labs/tests ordered:  No orders of the defined types were placed in this encounter.  Next appt:  05/29/2018   Karen Kays, RN, AGPCNP, DNP Student  [facility] [phone], [place] [address] Cell Phone (Mon-Fri 8am-5pm):  [phone] On Call:  [phone] & follow prompts after 5pm & weekends Office Phone:  [phone] Office Fax:  [phone]

## 2018-03-04 DIAGNOSIS — R41841 Cognitive communication deficit: Secondary | ICD-10-CM | POA: Diagnosis not present

## 2018-03-06 ENCOUNTER — Telehealth: Payer: Self-pay | Admitting: *Deleted

## 2018-03-06 NOTE — Telephone Encounter (Signed)
The protonix should not be causing nausea.  I think she is interpreting her reflux as nausea (her dementia is likely making it a challenge to interpret).  It may take a little while for the protonix to become effective when taken properly.  I recommend she continue to take it one hour before breakfast.

## 2018-03-06 NOTE — Telephone Encounter (Signed)
Daughter notified and agreed.  

## 2018-03-06 NOTE — Telephone Encounter (Signed)
[name], daughter called and stated that patient was seen this past Monday and was told to take her protonix 1 hour before breakfast. Patient is having trouble doing this because it is making her sick to her stomach. Please Advise.

## 2018-03-17 ENCOUNTER — Other Ambulatory Visit: Payer: Medicare Other

## 2018-03-19 ENCOUNTER — Ambulatory Visit: Payer: Medicare Other | Admitting: Internal Medicine

## 2018-03-23 ENCOUNTER — Telehealth: Payer: Self-pay | Admitting: *Deleted

## 2018-03-23 IMAGING — CR DG CHEST 1V PORT
1 series · 1 of 1 positions shown · non-contrast
Comparison: 12/25/2015

CLINICAL DATA: Atrial fibrillation

EXAM:
PORTABLE CHEST 1 VIEW

[AP]
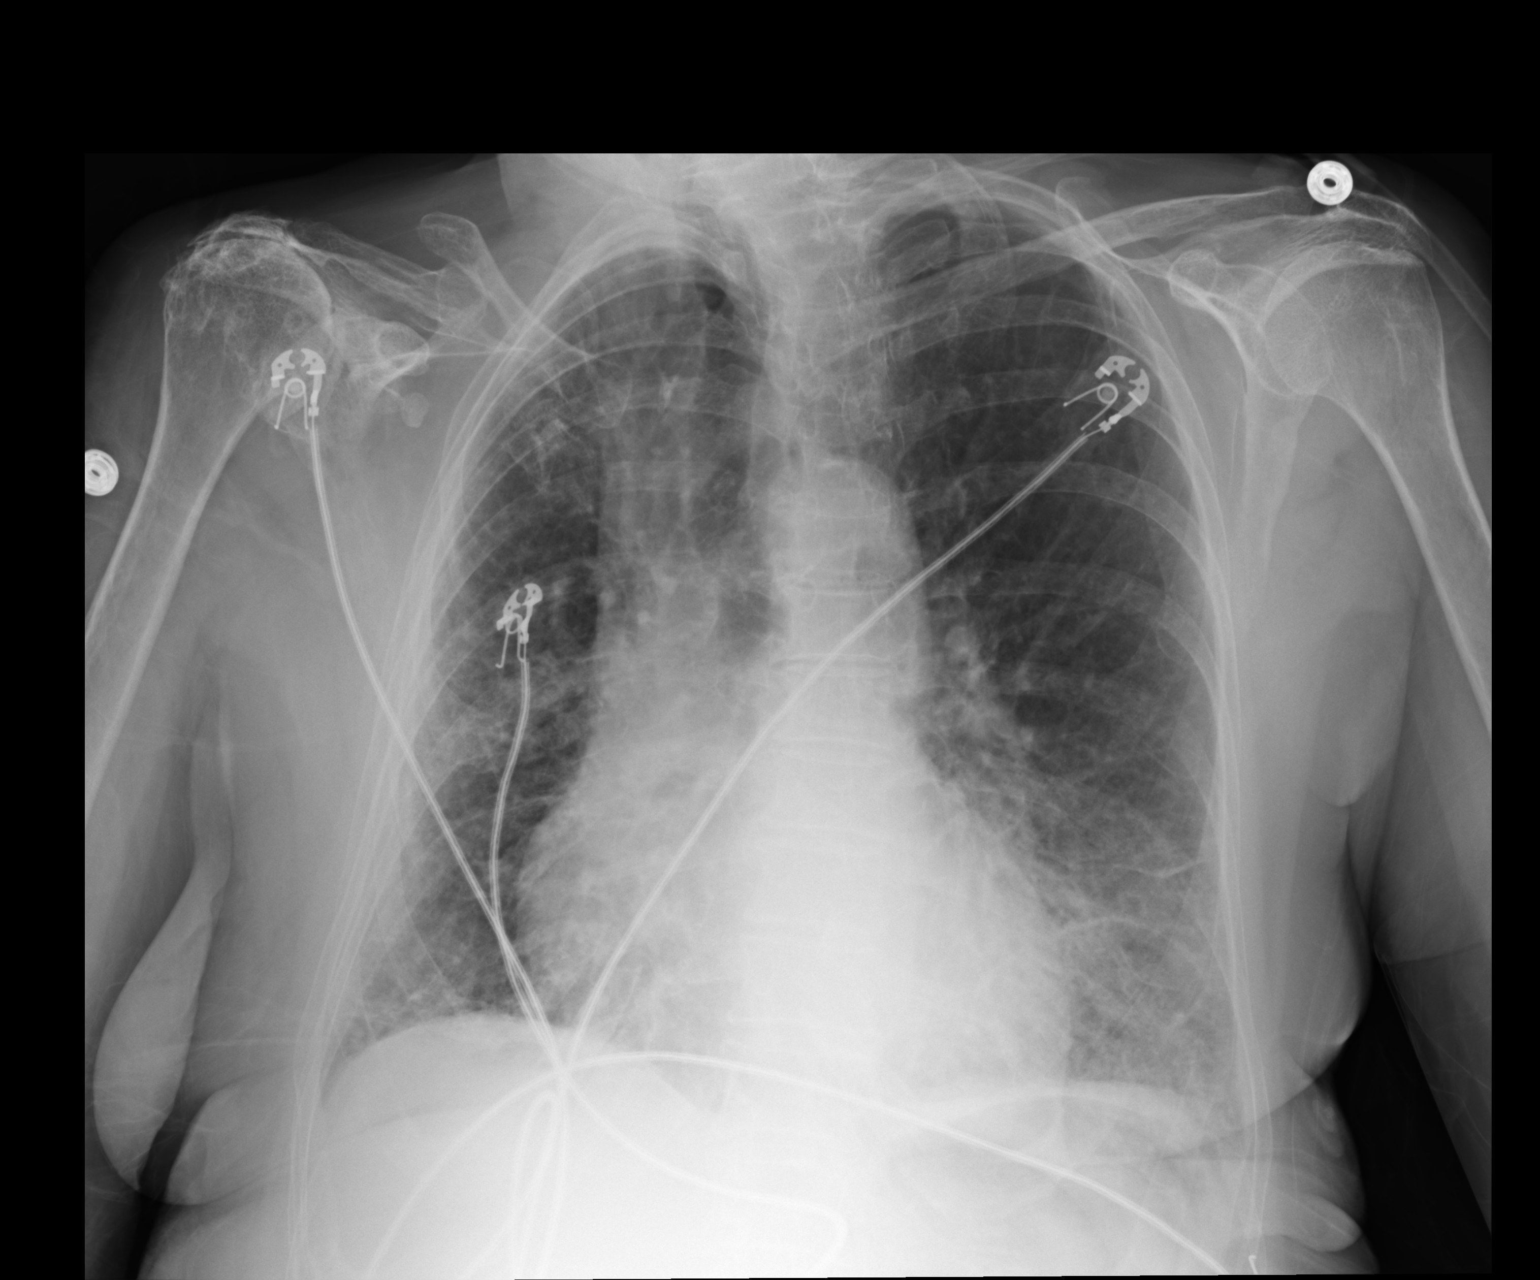

[1 of 1 positions shown; findings below may reference images not displayed]

FINDINGS: There is mild bilateral interstitial thickening. There is no pleural
effusion or pneumothorax. There is stable cardiomegaly.

There is loss of the normal right acromiohumeral distance. There is
osteoarthritis of the right glenohumeral joint.
IMPRESSION: 1. Cardiomegaly with pulmonary vascular congestion.

## 2018-03-23 NOTE — Telephone Encounter (Signed)
Follow-up with orthopedics is a good idea.

## 2018-03-23 NOTE — Telephone Encounter (Signed)
Considering symptoms have been going on for several months or even years, just worse, would recommend heat.  Important not to apply heat over the patches.

## 2018-03-23 NOTE — Telephone Encounter (Signed)
Had to [facility] per her request. Informed her to use heat but not to place it over the patches.

## 2018-03-23 NOTE — Telephone Encounter (Signed)
Daughter wants to know your opinion is this ok, Has gotten OTC Lidocaine patches that they are putting on patient's back and giving Tylenol Arthritis 2 in the morning and 2 in the evening. And do you recommend heat or cold compresses for the back and hip. Please Advise.

## 2018-03-23 NOTE — Telephone Encounter (Signed)
[name], daughter called and stated that patient's Back/Hip pain has gotten worse. Has followed recommended therapy but no relief.  Daughter is wondering what the next step is before she called the Orthopaedic office to schedule and appointment (patient has been to [facility] before). Please Advise.

## 2018-03-23 NOTE — Telephone Encounter (Signed)
Daughter notified 

## 2018-03-24 ENCOUNTER — Other Ambulatory Visit: Payer: Medicare Other

## 2018-03-24 DIAGNOSIS — M545 Low back pain: Secondary | ICD-10-CM | POA: Diagnosis not present

## 2018-03-24 DIAGNOSIS — M48061 Spinal stenosis, lumbar region without neurogenic claudication: Secondary | ICD-10-CM | POA: Diagnosis not present

## 2018-03-24 DIAGNOSIS — M5136 Other intervertebral disc degeneration, lumbar region: Secondary | ICD-10-CM | POA: Diagnosis not present

## 2018-03-26 ENCOUNTER — Ambulatory Visit: Payer: Medicare Other | Admitting: Internal Medicine

## 2018-04-08 DIAGNOSIS — M4696 Unspecified inflammatory spondylopathy, lumbar region: Secondary | ICD-10-CM | POA: Diagnosis not present

## 2018-04-28 DIAGNOSIS — M47816 Spondylosis without myelopathy or radiculopathy, lumbar region: Secondary | ICD-10-CM | POA: Diagnosis not present

## 2018-05-15 DIAGNOSIS — M545 Low back pain: Secondary | ICD-10-CM | POA: Diagnosis not present

## 2018-05-15 DIAGNOSIS — H353222 Exudative age-related macular degeneration, left eye, with inactive choroidal neovascularization: Secondary | ICD-10-CM | POA: Diagnosis not present

## 2018-05-26 DIAGNOSIS — M25512 Pain in left shoulder: Secondary | ICD-10-CM | POA: Diagnosis not present

## 2018-05-26 DIAGNOSIS — M7542 Impingement syndrome of left shoulder: Secondary | ICD-10-CM | POA: Diagnosis not present

## 2018-05-26 DIAGNOSIS — M7541 Impingement syndrome of right shoulder: Secondary | ICD-10-CM | POA: Diagnosis not present

## 2018-05-26 DIAGNOSIS — M5412 Radiculopathy, cervical region: Secondary | ICD-10-CM | POA: Diagnosis not present

## 2018-05-29 ENCOUNTER — Ambulatory Visit: Payer: Medicare Other

## 2018-05-29 ENCOUNTER — Ambulatory Visit (INDEPENDENT_AMBULATORY_CARE_PROVIDER_SITE_OTHER): Payer: Medicare Other

## 2018-05-29 VITALS — BP 142/60 | HR 47 | Temp 98.0°F | Ht 62.0 in | Wt 127.0 lb

## 2018-05-29 DIAGNOSIS — Z0000 Encounter for general adult medical examination without abnormal findings: Secondary | ICD-10-CM

## 2018-05-29 DIAGNOSIS — M810 Age-related osteoporosis without current pathological fracture: Secondary | ICD-10-CM | POA: Diagnosis not present

## 2018-05-29 MED ORDER — DENOSUMAB 60 MG/ML ~~LOC~~ SOSY
60.0000 mg | PREFILLED_SYRINGE | Freq: Once | SUBCUTANEOUS | Status: AC
  Administered 2018-05-29: 60 mg via SUBCUTANEOUS

## 2018-05-29 NOTE — Patient Instructions (Signed)
[name] , Thank you for taking time to come for your Medicare Wellness Visit. I appreciate your ongoing commitment to your health goals. Please review the following plan we discussed and let me know if I can assist you in the future.   Screening recommendations/referrals: Colonoscopy excluded, over age 82 Mammogram excluded, over age 82 Bone Density up to date Recommended yearly ophthalmology/optometry visit for glaucoma screening and checkup Recommended yearly dental visit for hygiene and checkup  Vaccinations: Influenza vaccine up to date, due 2019 fall season Pneumococcal vaccine up to date, completed Tdap vaccine up to date, due 09/08/2022 Shingles vaccine due, declined for now    Advanced directives: Please bring [place] a copy of your living will and health care power of attorney  Conditions/risks identified: none  Next appointment: Tyson Dense, RN 05/31/2019 @ 11:30am   Preventive Care 82 Years and Older, Female Preventive care refers to lifestyle choices and visits with your health care provider that can promote health and wellness. What does preventive care include?  A yearly physical exam. This is also called an annual well check.  Dental exams once or twice a year.  Routine eye exams. Ask your health care provider how often you should have your eyes checked.  Personal lifestyle choices, including:  Daily care of your teeth and gums.  Regular physical activity.  Eating a healthy diet.  Avoiding tobacco and drug use.  Limiting alcohol use.  Practicing safe sex.  Taking low-dose aspirin every day.  Taking vitamin and mineral supplements as recommended by your health care provider. What happens during an annual well check? The services and screenings done by your health care provider during your annual well check will depend on your age, overall health, lifestyle risk factors, and family history of disease. Counseling  Your health care provider may ask you  questions about your:  Alcohol use.  Tobacco use.  Drug use.  Emotional well-being.  Home and relationship well-being.  Sexual activity.  Eating habits.  History of falls.  Memory and ability to understand (cognition).  Work and work Statistician.  Reproductive health. Screening  You may have the following tests or measurements:  Height, weight, and BMI.  Blood pressure.  Lipid and cholesterol levels. These may be checked every 5 years, or more frequently if you are over 59 years old.  Skin check.  Lung cancer screening. You may have this screening every year starting at age 82 if you have a 30-pack-year history of smoking and currently smoke or have quit within the past 15 years.  Fecal occult blood test (FOBT) of the stool. You may have this test every year starting at age 82.  Flexible sigmoidoscopy or colonoscopy. You may have a sigmoidoscopy every 5 years or a colonoscopy every 10 years starting at age 82.  Hepatitis C blood test.  Hepatitis B blood test.  Sexually transmitted disease (STD) testing.  Diabetes screening. This is done by checking your blood sugar (glucose) after you have not eaten for a while (fasting). You may have this done every 1-3 years.  Bone density scan. This is done to screen for osteoporosis. You may have this done starting at age 36.  Mammogram. This may be done every 1-2 years. Talk to your health care provider about how often you should have regular mammograms. Talk with your health care provider about your test results, treatment options, and if necessary, the need for more tests. Vaccines  Your health care provider may recommend certain vaccines, such as:  Influenza vaccine. This is recommended every year.  Tetanus, diphtheria, and acellular pertussis (Tdap, Td) vaccine. You may need a Td booster every 10 years.  Zoster vaccine. You may need this after age 82.  Pneumococcal 13-valent conjugate (PCV13) vaccine. One dose is  recommended after age 32.  Pneumococcal polysaccharide (PPSV23) vaccine. One dose is recommended after age 82. Talk to your health care provider about which screenings and vaccines you need and how often you need them. This information is not intended to replace advice given to you by your health care provider. Make sure you discuss any questions you have with your health care provider. Document Released: 12/22/2015 Document Revised: 08/14/2016 Document Reviewed: 09/26/2015 Elsevier Interactive Patient Education  2017 [place] Prevention in the Home Falls can cause injuries. They can happen to people of all ages. There are many things you can do to make your home safe and to help prevent falls. What can I do on the outside of my home?  Regularly fix the edges of walkways and driveways and fix any cracks.  Remove anything that might make you trip as you walk through a door, such as a raised step or threshold.  Trim any bushes or trees on the path to your home.  Use bright outdoor lighting.  Clear any walking paths of anything that might make someone trip, such as rocks or tools.  Regularly check to see if handrails are loose or broken. Make sure that both sides of any steps have handrails.  Any raised decks and porches should have guardrails on the edges.  Have any leaves, snow, or ice cleared regularly.  Use sand or salt on walking paths during winter.  Clean up any spills in your garage right away. This includes oil or grease spills. What can I do in the bathroom?  Use night lights.  Install grab bars by the toilet and in the tub and shower. Do not use towel bars as grab bars.  Use non-skid mats or decals in the tub or shower.  If you need to sit down in the shower, use a plastic, non-slip stool.  Keep the floor dry. Clean up any water that spills on the floor as soon as it happens.  Remove soap buildup in the tub or shower regularly.  Attach bath mats  securely with double-sided non-slip rug tape.  Do not have throw rugs and other things on the floor that can make you trip. What can I do in the bedroom?  Use night lights.  Make sure that you have a light by your bed that is easy to reach.  Do not use any sheets or blankets that are too big for your bed. They should not hang down onto the floor.  Have a firm chair that has side arms. You can use this for support while you get dressed.  Do not have throw rugs and other things on the floor that can make you trip. What can I do in the kitchen?  Clean up any spills right away.  Avoid walking on wet floors.  Keep items that you use a lot in easy-to-reach places.  If you need to reach something above you, use a strong step stool that has a grab bar.  Keep electrical cords out of the way.  Do not use floor polish or wax that makes floors slippery. If you must use wax, use non-skid floor wax.  Do not have throw rugs and other things on the floor that  can make you trip. What can I do with my stairs?  Do not leave any items on the stairs.  Make sure that there are handrails on both sides of the stairs and use them. Fix handrails that are broken or loose. Make sure that handrails are as long as the stairways.  Check any carpeting to make sure that it is firmly attached to the stairs. Fix any carpet that is loose or worn.  Avoid having throw rugs at the top or bottom of the stairs. If you do have throw rugs, attach them to the floor with carpet tape.  Make sure that you have a light switch at the top of the stairs and the bottom of the stairs. If you do not have them, ask someone to add them for you. What else can I do to help prevent falls?  Wear shoes that:  Do not have high heels.  Have rubber bottoms.  Are comfortable and fit you well.  Are closed at the toe. Do not wear sandals.  If you use a stepladder:  Make sure that it is fully opened. Do not climb a closed  stepladder.  Make sure that both sides of the stepladder are locked into place.  Ask someone to hold it for you, if possible.  Clearly mark and make sure that you can see:  Any grab bars or handrails.  First and last steps.  Where the edge of each step is.  Use tools that help you move around (mobility aids) if they are needed. These include:  Canes.  Walkers.  Scooters.  Crutches.  Turn on the lights when you go into a dark area. Replace any light bulbs as soon as they burn out.  Set up your furniture so you have a clear path. Avoid moving your furniture around.  If any of your floors are uneven, fix them.  If there are any pets around you, be aware of where they are.  Review your medicines with your doctor. Some medicines can make you feel dizzy. This can increase your chance of falling. Ask your doctor what other things that you can do to help prevent falls. This information is not intended to replace advice given to you by your health care provider. Make sure you discuss any questions you have with your health care provider. Document Released: 09/21/2009 Document Revised: 05/02/2016 Document Reviewed: 12/30/2014 Elsevier Interactive Patient Education  2017 [employer].

## 2018-05-29 NOTE — Progress Notes (Addendum)
Subjective:   Brittney Gutierrez is a 84 y.o. female who presents for Medicare Annual (Subsequent) preventive examination.  Last AWV-05/07/2017      Objective:     Vitals: BP (!) 142/60 (BP Location: Left Arm, Patient Position: Sitting)   Pulse (!) 47   Temp 98 F (36.7 C) (Oral)   Ht 5\' 2"  (1.575 m)   Wt 127 lb (57.6 kg)   SpO2 90%   BMI 23.23 kg/m   Body mass index is 23.23 kg/m.  Advanced Directives 05/29/2018 03/02/2018 12/13/2017 12/03/2017 10/28/2017 10/20/2017 09/29/2017  Does Patient Have a Medical Advance Directive? Yes Yes Yes Yes Yes Yes Yes  Type of Paramedic of [place];Living will [place];Living will [place];Living will Healthcare Power of [place] of [place];Living will  Does patient want to make changes to medical advance directive? No - Patient declined - No - Patient declined - No - Patient declined No - Patient declined -  Copy of [place] in Chart? Yes No - copy requested No - copy requested Yes - Yes No - copy requested  Would patient like information on creating a medical advance directive? - - - - - - -    Tobacco Social History   Tobacco Use  Smoking Status Never Smoker  Smokeless Tobacco Never Used     Counseling given: Not Answered   Clinical Intake:  Pre-visit preparation completed: No  Pain : No/denies pain     Nutritional Risks: None Diabetes: Yes CBG done?: No Did pt. bring in CBG monitor from home?: No  How often do you need to have someone help you when you read instructions, pamphlets, or other written materials from your doctor or pharmacy?: 1 - Never What is the last grade level you completed in school?: 12th grade  Interpreter Needed?: No  Information entered by :: Tyson Dense, RN  Past Medical History:  Diagnosis Date  . Abnormal weight loss 05/28/2004  .  Anxiety 01/17/2003  . Back pain 08/19/2005   with radiculopathy  . Bradycardia, drug induced    Diltiazem  . Cerebral atherosclerosis 09/24/1999  . Chest pain, atypical 04/2016   minor CAD at cath   . CHF (congestive heart failure) ([place])   . Chronic anticoagulation    Eliquis  . Dementia    "don't know kind or stage" (05/02/2016)  . Depression 09/06/2002  . Diverticulitis 09/28/2007  . External hemorrhoids 04/12/2009  . Female climacteric state 03/04/2000  . 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 GERD (gastroesophageal reflux disease)   . Hypertensive cardiovascular disease    moderate LVH on echo march 2017  . Macular degeneration   . Malaise and fatigue   . Neurocysticercosis    Craniotomy at [facility] in early 2000s  . Osteoarthrosis, localized   . Osteoporosis 05/28/2004  . Paranoia ([place])   . Paroxysmal A-fib ([place])   . TIA (transient ischemic attack) 2000s?  [name] Urinary incontinence 08/18/2007  . UTI (urinary tract infection)   . Vaginitis, atrophic   . Vertigo, peripheral 10/07/2000   Past Surgical History:  Procedure Laterality Date  . BRAIN SURGERY  2000   Neurocysticercosis ("parasite")  . CARDIAC CATHETERIZATION N/A 05/03/2016   Procedure: Left Heart Cath and Coronary Angiography;  Surgeon: Sherren Mocha, MD;  Location: [place] [facility];  Service: Cardiovascular;  Laterality: N/A;  .  CARDIOVERSION N/A 07/14/2017   Procedure: CARDIOVERSION;  Surgeon: Pixie Casino, MD;  Location: [facility] ENDOSCOPY;  Service: Cardiovascular;  Laterality: N/A;  . CATARACT EXTRACTION  2013  . CATARACT EXTRACTION, BILATERAL     "not sure if both eyes; feel like it probably was"  . JOINT REPLACEMENT    . PERIPHERAL VASCULAR CATHETERIZATION N/A 05/03/2016   Procedure: Abdominal Aortogram;  Surgeon: Sherren Mocha, MD;  Location: [place] [facility];  Service: Cardiovascular;  Laterality: N/A;  . SHOULDER ARTHROSCOPY W/ ROTATOR CUFF REPAIR Right X 3  .  TEE WITHOUT CARDIOVERSION N/A 07/14/2017   Procedure: TRANSESOPHAGEAL ECHOCARDIOGRAM (TEE);  Surgeon: Pixie Casino, MD;  Location: [place];  Service: Cardiovascular;  Laterality: N/A;  . TOTAL KNEE ARTHROPLASTY Left 1990  . VAGINAL HYSTERECTOMY  1960   Family History  Problem Relation Age of Onset  . Polycystic kidney disease Mother   . Heart attack Father   . Hypertension Sister   . Dementia Neg Hx    Social History   Socioeconomic History  . Marital status: Widowed    Spouse name: Not on file  . Number of children: Not on file  . Years of education: Not on file  . Highest education level: Not on file  Occupational History  . Not on file  Social Needs  . Financial resource strain: Not hard at all  . Food insecurity:    Worry: Never true    Inability: Never true  . Transportation needs:    Medical: No    Non-medical: No  Tobacco Use  . Smoking status: Never Smoker  . Smokeless tobacco: Never Used  Substance and Sexual Activity  . Alcohol use: No    Alcohol/week: 0.0 oz  . Drug use: No  . Sexual activity: Never  Lifestyle  . Physical activity:    Days per week: 7 days    Minutes per session: 20 min  . Stress: To some extent  Relationships  . Social connections:    Talks on phone: More than three times a week    Gets together: More than three times a week    Attends religious service: Never    Active member of club or organization: No    Attends meetings of clubs or organizations: Never    Relationship status: Widowed  Other Topics Concern  . Not on file  Social History Narrative   DIET: None      DO YOU DRINK/EAT THINGS WITH CAFFEINE: yes      MARITAL STATUS: widow      WHAT YEAR WERE YOU MARRIED: 1948      DO YOU LIVE IN A HOUSE, APARTMENT, ASSISTED LIVING, CONDO TRAILER ETC.: Independent Living      IS IT ONE OR MORE STORIES: 1 story      HOW MANY PERSONS LIVE IN YOUR HOME: 1      DO YOU HAVE PETS IN YOUR HOME: no      CURRENT OR PAST  PROFESSION: Book Keeper      DO YOU EXERCISE: no      WHAT TYPE AND HOW OFTEN:    Outpatient Encounter Medications as of 05/29/2018  Medication Sig  . acetaminophen (TYLENOL) 325 MG tablet Take 650 mg by mouth every 6 (six) hours as needed for mild pain.  [name] amLODipine (NORVASC) 10 MG tablet Take 1 tablet (10 mg total) by mouth daily.  [name] apixaban (ELIQUIS) 2.5 MG TABS tablet Take 1 tablet (2.5 mg total) by mouth 2 (  two) times daily.  . Cholecalciferol (VITAMIN D3) 2000 units TABS Take 2,000 Units by mouth daily.   [name] docusate sodium (COLACE) 100 MG capsule Take 100 mg by mouth 2 (two) times daily.   [name] donepezil (ARICEPT) 10 MG tablet Take 1 tablet (10 mg total) by mouth daily.  . furosemide (LASIX) 40 MG tablet Take 1-2 tablets (40-80 mg total) by mouth daily as directed.  [name] losartan (COZAAR) 25 MG tablet Take 1 tablet (25 mg total) by mouth daily.  . Melatonin 5 MG CAPS Take by mouth.  . metoprolol tartrate (LOPRESSOR) 50 MG tablet Take 50 mg by mouth 2 (two) times daily.  [name] venlafaxine (EFFEXOR) 75 MG tablet Take 1 tablet (75 mg total) 2 (two) times daily by mouth.  . pantoprazole (PROTONIX) 40 MG tablet Take 1 tablet (40 mg total) by mouth daily. One hour before breakfast  . [EXPIRED] denosumab (PROLIA) injection 60 mg    No facility-administered encounter medications on file as of 05/29/2018.     Activities of Daily Living In your present state of health, do you have any difficulty performing the following activities: 05/29/2018 12/13/2017  Hearing? Y [place]? N N  Comment - -  Difficulty concentrating or making decisions? [place]  Walking or climbing stairs? Y Y  Comment - -  Dressing or bathing? N Y  Doing errands, shopping? [place] Donning  Preparing Food and eating ? N -  Using the Toilet? N -  In the past six months, have you accidently leaked urine? Y -  Do you have problems with loss of bowel control? N -  Managing your Medications? Y -  Managing your Finances? Y -    Housekeeping or managing your Housekeeping? Y -  Some recent data might be hidden    Patient Care Team: Gayland Curry, DO as PCP - General (Geriatric Medicine) Sanda Klein, MD as PCP - Cardiology (Cardiology) Sherlynn Stalls, MD as Consulting Physician (Ophthalmology) Murriel Hopper, MD as Referring Physician (Student)    Assessment:   This is a routine wellness examination for [facility].  Exercise Activities and Dietary recommendations Current Exercise Habits: Home exercise routine, Type of exercise: walking, Time (Minutes): 20, Frequency (Times/Week): 7, Weekly Exercise (Minutes/Week): 140, Intensity: Mild, Exercise limited by: None identified  Goals    . Exercise 3x per week (30 min per time)     Starting today pt will walk several times a week with people in her community        Fall Risk Fall Risk  05/29/2018 03/02/2018 12/22/2017 12/03/2017 11/20/2017  Falls in the past year? No No No No No  Number falls in past yr: - - - - -  Injury with Fall? - - - - -  Comment - - - - -   Is the patient's home free of loose throw rugs in walkways, pet beds, electrical cords, etc?   yes      Grab bars in the bathroom? yes      Handrails on the stairs?   yes      Adequate lighting?   yes  Depression Screen PHQ 2/9 Scores 05/29/2018 11/20/2017 10/20/2017 05/07/2017  PHQ - 2 Score 0 0 0 3  PHQ- 9 Score - - - 9     Cognitive Function MMSE - Mini Mental State Exam 05/29/2018 05/07/2017 02/08/2016  Not completed: - - (No Data)  Orientation to time 0 4 3  Orientation to Place 0 1  2  Registration 3 3 3   Attention/ Calculation 0 0 4  Recall 0 0 1  Language- name 2 objects 2 2 2   Language- repeat 0 0 0  Language- follow 3 step command 3 2 3   Language- read & follow direction 1 1 1   Write a sentence 0 1 1  Copy design 0 0 0  Total score 9 14 20         Immunization History  Administered Date(s) Administered  . Influenza Split 11/02/1999, 10/18/2000, 10/22/2001, 09/18/2002,  09/23/2003, 09/22/2004, 10/10/2005, 09/18/2006, 10/19/2007, 09/21/2008  . Influenza Whole 10/09/2009, 09/21/2010, 10/04/2011, 09/04/2012, 09/09/2013  . Influenza, High Dose Seasonal PF 09/23/2014, 08/30/2015, 09/29/2017  . Influenza,inj,Quad PF,6+ Mos 09/19/2016  . Pneumococcal Conjugate-13 10/24/2015  . Pneumococcal-Unspecified 10/19/2007  . Tdap 05/14/2012, 09/08/2012  . Zoster 10/14/2012    Qualifies for Shingles Vaccine? Yes, educated and want to wait  Screening Tests Health Maintenance  Topic Date Due  . INFLUENZA VACCINE  07/09/2018  . TETANUS/TDAP  09/08/2022  . DEXA SCAN  Completed  . PNA vac Low Risk Adult  Completed    Cancer Screenings: Lung: Low Dose CT Chest recommended if Age 58-80 years, 30 pack-year currently smoking OR have quit w/in 15years. Patient does not qualify. Breast:  Up to date on Mammogram? Yes   Up to date of Bone Density/Dexa? Yes Colorectal: up to date  Additional Screenings:  Hepatitis C Screening: declined Prolia given today     Plan:    I have personally reviewed and addressed the Medicare Annual Wellness questionnaire and have noted the following in the patient's chart:  A. Medical and social history B. Use of alcohol, tobacco or illicit drugs  C. Current medications and supplements D. Functional ability and status E.  Nutritional status F.  Physical activity G. Advance directives H. List of other physicians I.  Hospitalizations, surgeries, and ER visits in previous 12 months J.  [place] to include hearing, vision, cognitive, depression L. Referrals and appointments - none  In addition, I have reviewed and discussed with patient certain preventive protocols, quality metrics, and best practice recommendations. A written personalized care plan for preventive services as well as general preventive health recommendations were provided to patient.  See attached scanned questionnaire for additional information.   Signed,    Tyson Dense, RN Nurse Health Advisor  Patient Concerns: None

## 2018-06-04 ENCOUNTER — Other Ambulatory Visit: Payer: Self-pay | Admitting: Physician Assistant

## 2018-06-04 ENCOUNTER — Other Ambulatory Visit: Payer: Self-pay | Admitting: *Deleted

## 2018-06-04 ENCOUNTER — Other Ambulatory Visit: Payer: Self-pay | Admitting: Cardiovascular Disease

## 2018-06-04 DIAGNOSIS — I10 Essential (primary) hypertension: Secondary | ICD-10-CM

## 2018-06-04 MED ORDER — AMLODIPINE BESYLATE 10 MG PO TABS
10.0000 mg | ORAL_TABLET | Freq: Every day | ORAL | 1 refills | Status: DC
Start: 2018-06-04 — End: 2018-08-31

## 2018-06-04 NOTE — Telephone Encounter (Signed)
Patient daughter called and requested refill to be sent to Mail order. Faxed.

## 2018-08-13 ENCOUNTER — Ambulatory Visit: Payer: Medicare Other | Admitting: Internal Medicine

## 2018-08-31 ENCOUNTER — Other Ambulatory Visit: Payer: Self-pay | Admitting: *Deleted

## 2018-08-31 DIAGNOSIS — I10 Essential (primary) hypertension: Secondary | ICD-10-CM

## 2018-08-31 MED ORDER — METOPROLOL TARTRATE 50 MG PO TABS: 50.0000 mg | ORAL_TABLET | Freq: Two times a day (BID) | ORAL | 1 refills | Status: DC

## 2018-08-31 MED ORDER — AMLODIPINE BESYLATE 10 MG PO TABS
10.0000 mg | ORAL_TABLET | Freq: Every day | ORAL | 1 refills | Status: DC
Start: 2018-08-31 — End: 2019-03-03

## 2018-08-31 NOTE — Telephone Encounter (Signed)
[name], caregiver requested refill to be sent to [facility].

## 2018-09-02 DIAGNOSIS — B079 Viral wart, unspecified: Secondary | ICD-10-CM | POA: Diagnosis not present

## 2018-09-02 DIAGNOSIS — H353222 Exudative age-related macular degeneration, left eye, with inactive choroidal neovascularization: Secondary | ICD-10-CM | POA: Diagnosis not present

## 2018-09-02 DIAGNOSIS — H353213 Exudative age-related macular degeneration, right eye, with inactive scar: Secondary | ICD-10-CM | POA: Diagnosis not present

## 2018-09-02 DIAGNOSIS — H43813 Vitreous degeneration, bilateral: Secondary | ICD-10-CM | POA: Diagnosis not present

## 2018-09-02 DIAGNOSIS — M79672 Pain in left foot: Secondary | ICD-10-CM | POA: Diagnosis not present

## 2018-09-18 ENCOUNTER — Encounter: Payer: Self-pay | Admitting: Internal Medicine

## 2018-09-18 ENCOUNTER — Ambulatory Visit: Payer: Medicare Other | Admitting: Internal Medicine

## 2018-09-18 ENCOUNTER — Ambulatory Visit (INDEPENDENT_AMBULATORY_CARE_PROVIDER_SITE_OTHER): Payer: Medicare Other | Admitting: Internal Medicine

## 2018-09-18 VITALS — BP 140/70 | HR 54 | Temp 97.6°F | Ht 62.0 in | Wt 132.0 lb

## 2018-09-18 DIAGNOSIS — L240 Irritant contact dermatitis due to detergents: Secondary | ICD-10-CM | POA: Diagnosis not present

## 2018-09-18 MED ORDER — TRIAMCINOLONE ACETONIDE 0.025 % EX OINT: 1.0000 "application " | TOPICAL_OINTMENT | Freq: Two times a day (BID) | CUTANEOUS | 1 refills | Status: DC

## 2018-09-18 NOTE — Patient Instructions (Signed)
AVOID SOAPS/DETERGENTS WITH PARFUMES AND DYES  Continue other medications as ordered  Follow up with Dr Mariea Clonts as scheduled or sooner if need be.   Contact Dermatitis Dermatitis is redness, soreness, and swelling (inflammation) of the skin. Contact dermatitis is a reaction to certain substances that touch the skin. You either touched something that irritated your skin, or you have allergies to something you touched. Follow these instructions at home: [place] your skin as needed.  Apply cool compresses to the affected areas.  Try taking a bath with: ? Epsom salts. Follow the instructions on the package. You can get these at a pharmacy or grocery store. ? Baking soda. Pour a small amount into the bath as told by your doctor. ? Colloidal oatmeal. Follow the instructions on the package. You can get this at a pharmacy or grocery store.  Try applying baking soda paste to your skin. Stir water into baking soda until it looks like paste.  Do not scratch your skin.  Bathe less often.  Bathe in lukewarm water. Avoid using hot water. Medicines  Take or apply over-the-counter and prescription medicines only as told by your doctor.  If you were prescribed an antibiotic medicine, take or apply your antibiotic as told by your doctor. Do not stop taking the antibiotic even if your condition starts to get better. General instructions  Keep all follow-up visits as told by your doctor. This is important.  Avoid the substance that caused your reaction. If you do not know what caused it, keep a journal to try to track what caused it. Write down: ? What you eat. ? What cosmetic products you use. ? What you drink. ? What you wear in the affected area. This includes jewelry.  If you were given a bandage (dressing), take care of it as told by your doctor. This includes when to change and remove it. Contact a doctor if:  You do not get better with treatment.  Your condition gets  worse.  You have signs of infection such as: ? Swelling. ? Tenderness. ? Redness. ? Soreness. ? Warmth.  You have a fever.  You have new symptoms. Get help right away if:  You have a very bad headache.  You have neck pain.  Your neck is stiff.  You throw up (vomit).  You feel very sleepy.  You see red streaks coming from the affected area.  Your bone or joint underneath the affected area becomes painful after the skin has healed.  The affected area turns darker.  You have trouble breathing. This information is not intended to replace advice given to you by your health care provider. Make sure you discuss any questions you have with your health care provider. Document Released: 09/22/2009 Document Revised: 05/02/2016 Document Reviewed: 04/12/2015 Elsevier Interactive Patient Education  2018 [employer].

## 2018-09-18 NOTE — Progress Notes (Signed)
Patient ID: Brittney Gutierrez, female   DOB: Jul 24, 1927, 84 y.o.   MRN: [id number]   [facility] OFFICE  Provider: DR Arletha Grippe  Code Status:  Goals of Care:  Advanced Directives 05/29/2018  Does Patient Have a Medical Advance Directive? Yes  Type of Advance Directive [place]  Does patient want to make changes to medical advance directive? No - Patient declined  Copy of [place] in Chart? Yes  Would patient like information on creating a medical advance directive? -     Chief Complaint  Patient presents with  . Acute Visit    rash on shoulder x 1day    HPI: Patient is a 84 y.o. female seen today for an acute visit for rash x 2 days. Rash is itchy. No blisters. No pain. No d/c. She recently used a different laundry detergent (gain with fragrance). She usually uses ALL fragrance/parfume free detergent. No f/c. She is a poor historian due to dementia. Hx obtained from chart  Past Medical History:  Diagnosis Date  . Abnormal weight loss 05/28/2004  . Anxiety 01/17/2003  . Back pain 08/19/2005   with radiculopathy  . Bradycardia, drug induced    Diltiazem  . Cerebral atherosclerosis 09/24/1999  . Chest pain, atypical 04/2016   minor CAD at cath   . CHF (congestive heart failure) ([place])   . Chronic anticoagulation    Eliquis  . Dementia ([place])    "don't know kind or stage" (05/02/2016)  . Depression 09/06/2002  . Diverticulitis 09/28/2007  . External hemorrhoids 04/12/2009  . Female climacteric state 03/04/2000  . 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 GERD (gastroesophageal reflux disease)   . Hypertensive cardiovascular disease    moderate LVH on echo march 2017  . Macular degeneration   . Malaise and fatigue   . Neurocysticercosis    Craniotomy at [facility] in early 2000s  . Osteoarthrosis, localized   . Osteoporosis 05/28/2004  . Paranoia ([place])   . Paroxysmal A-fib ([place])   . TIA (transient  ischemic attack) 2000s?  [name] Urinary incontinence 08/18/2007  . UTI (urinary tract infection)   . Vaginitis, atrophic   . Vertigo, peripheral 10/07/2000    Past Surgical History:  Procedure Laterality Date  . BRAIN SURGERY  2000   Neurocysticercosis ("parasite")  . CARDIAC CATHETERIZATION N/A 05/03/2016   Procedure: Left Heart Cath and Coronary Angiography;  Surgeon: Sherren Mocha, MD;  Location: [place] [facility];  Service: Cardiovascular;  Laterality: N/A;  . CARDIOVERSION N/A 07/14/2017   Procedure: CARDIOVERSION;  Surgeon: Pixie Casino, MD;  Location: [facility] ENDOSCOPY;  Service: Cardiovascular;  Laterality: N/A;  . CATARACT EXTRACTION  2013  . CATARACT EXTRACTION, BILATERAL     "not sure if both eyes; feel like it probably was"  . JOINT REPLACEMENT    . PERIPHERAL VASCULAR CATHETERIZATION N/A 05/03/2016   Procedure: Abdominal Aortogram;  Surgeon: Sherren Mocha, MD;  Location: [place] [facility];  Service: Cardiovascular;  Laterality: N/A;  . SHOULDER ARTHROSCOPY W/ ROTATOR CUFF REPAIR Right X 3  . TEE WITHOUT CARDIOVERSION N/A 07/14/2017   Procedure: TRANSESOPHAGEAL ECHOCARDIOGRAM (TEE);  Surgeon: Pixie Casino, MD;  Location: [place];  Service: Cardiovascular;  Laterality: N/A;  . TOTAL KNEE ARTHROPLASTY Left 1990  . VAGINAL HYSTERECTOMY  1960     reports that she has never smoked. She has never used smokeless tobacco. She reports that she does not drink alcohol or  use drugs. Social History   Socioeconomic History  . Marital status: Widowed    Spouse name: Not on file  . Number of children: Not on file  . Years of education: Not on file  . Highest education level: Not on file  Occupational History  . Not on file  Social Needs  . Financial resource strain: Not hard at all  . Food insecurity:    Worry: Never true    Inability: Never true  . Transportation needs:    Medical: No    Non-medical: No  Tobacco Use  . Smoking status: Never Smoker  . Smokeless tobacco:  Never Used  Substance and Sexual Activity  . Alcohol use: No    Alcohol/week: 0.0 standard drinks  . Drug use: No  . Sexual activity: Never  Lifestyle  . Physical activity:    Days per week: 7 days    Minutes per session: 20 min  . Stress: To some extent  Relationships  . Social connections:    Talks on phone: More than three times a week    Gets together: More than three times a week    Attends religious service: Never    Active member of club or organization: No    Attends meetings of clubs or organizations: Never    Relationship status: Widowed  . Intimate partner violence:    Fear of current or ex partner: No    Emotionally abused: No    Physically abused: No    Forced sexual activity: No  Other Topics Concern  . Not on file  Social History Narrative   DIET: None      DO YOU DRINK/EAT THINGS WITH CAFFEINE: yes      MARITAL STATUS: widow      WHAT YEAR WERE YOU MARRIED: 1948      DO YOU LIVE IN A HOUSE, APARTMENT, ASSISTED LIVING, CONDO TRAILER ETC.: Independent Living      IS IT ONE OR MORE STORIES: 1 story      HOW MANY PERSONS LIVE IN YOUR HOME: 1      DO YOU HAVE PETS IN YOUR HOME: no      CURRENT OR PAST PROFESSION: Book Keeper      DO YOU EXERCISE: no      WHAT TYPE AND HOW OFTEN:    Family History  Problem Relation Age of Onset  . Polycystic kidney disease Mother   . Heart attack Father   . Hypertension Sister   . Dementia Neg Hx     Allergies  Allergen Reactions  . Bactrim [Sulfamethoxazole-Trimethoprim] Nausea Only  . Amiodarone Itching  . Amoxicillin Other (See Comments)    Unknown- ? Upset stomach  . Demerol [Meperidine] Other (See Comments)    Hallucinations  . Hydralazine Other (See Comments)    Tingling and chest pain   . Lisinopril Cough  . Zoloft [Sertraline Hcl] Other (See Comments)    Unknown  . Tape Rash and Other (See Comments)    Can tear the skin, also    Outpatient Encounter Medications as of 09/18/2018  Medication  Sig  . acetaminophen (TYLENOL) 325 MG tablet Take 650 mg by mouth every 6 (six) hours as needed for mild pain.  [name] amLODipine (NORVASC) 10 MG tablet Take 1 tablet (10 mg total) by mouth daily.  . Cholecalciferol (VITAMIN D3) 2000 units TABS Take 2,000 Units by mouth daily.   [name] docusate sodium (COLACE) 100 MG capsule Take 100 mg by mouth 2 (two)  times daily.   [name] donepezil (ARICEPT) 10 MG tablet Take 1 tablet (10 mg total) by mouth daily.  [name] ELIQUIS 2.5 MG TABS tablet TAKE 1 TABLET TWICE DAILY  . furosemide (LASIX) 40 MG tablet Take 1-2 tablets (40-80 mg total) by mouth daily as directed.  [name] losartan (COZAAR) 25 MG tablet TAKE 1 TABLET EVERY DAY  . Melatonin 5 MG CAPS Take by mouth.  . metoprolol tartrate (LOPRESSOR) 50 MG tablet Take 1 tablet (50 mg total) by mouth 2 (two) times daily.  . pantoprazole (PROTONIX) 40 MG tablet Take 1 tablet (40 mg total) by mouth daily. One hour before breakfast  . venlafaxine (EFFEXOR) 75 MG tablet Take 1 tablet (75 mg total) 2 (two) times daily by mouth.   No facility-administered encounter medications on file as of 09/18/2018.     Review of Systems:  Review of Systems  Unable to perform ROS: Dementia    Health Maintenance  Topic Date Due  . INFLUENZA VACCINE  07/09/2018  . TETANUS/TDAP  09/08/2022  . DEXA SCAN  Completed  . PNA vac Low Risk Adult  Completed    Physical Exam: Vitals:   09/18/18 0859  BP: 140/70  Pulse: (!) 54  Temp: 97.6 F (36.4 C)  TempSrc: Oral  SpO2: 99%  Weight: 132 lb (59.9 kg)  Height: 5\' 2"  (1.575 m)   Body mass index is 24.14 kg/m. Physical Exam  Constitutional: She appears well-developed and well-nourished.  Neurological: She is alert.  Skin: Rash (dry eczematous rash, no vesicular formation; no secondary signs of infection) noted.    Labs reviewed: Basic Metabolic Panel: Recent Labs    12/13/17 1840 12/14/17 0732 12/15/17 0226 12/16/17 0633 12/22/17 1059  NA  --  129* 133* 132* 135  K  --  4.4 4.2  4.2 3.7  CL  --  97* 100* 99* 99  CO2  --  21* 23 24 27   GLUCOSE  --  83 68 91 115  BUN  --  19 21* 18 22  CREATININE  --  1.28* 1.62* 1.54* 1.50*  CALCIUM  --  8.7* 8.1* 8.6* 9.2  MG  --  1.8  --   --   --   TSH 8.725*  --   --   --   --    Liver Function Tests: Recent Labs    12/03/17 1016  AST 28  ALT 22  BILITOT 1.1  PROT 7.1   Recent Labs    12/03/17 1016  LIPASE 39  AMYLASE 36   No results for input(s): AMMONIA in the last 8760 hours. CBC: Recent Labs    10/28/17 1105 12/03/17 1016  12/15/17 0226 12/16/17 0633 02/26/18 1042  WBC 5.7 6.1   < > 6.4 6.3 5.4  NEUTROABS 4,520 4,709  --   --   --  3,883  HGB 10.6* 12.9   < > 10.3* 11.6* 11.3*  HCT 32.0* 38.2   < > 33.0* 37.4 34.2*  MCV 86.5 87.6   < > 91.2 91.4 89.8  PLT 198 267   < > 221 255 287   < > = values in this interval not displayed.   Lipid Panel: No results for input(s): CHOL, HDL, LDLCALC, TRIG, CHOLHDL, LDLDIRECT in the last 8760 hours. Lab Results  Component Value Date   HGBA1C 6.2 (H) 05/02/2016    Procedures since last visit: No results found.  Assessment/Plan   ICD-10-CM   1. Contact dermatitis and eczema due to detergents  L24.0 triamcinolone (KENALOG) 0.025 % ointment   Start triamcinolone ointment to rash BID until gone then may use prn  AVOID SOAPS/DETERGENTS WITH PARFUMES AND DYES  Continue other medications as ordered  Follow up with Dr Mariea Clonts as scheduled or sooner if need be.   Brittney Reine S. Perlie Gold  [facility] and Adult Medicine [address] [place], [place] [address] [phone] Cell (Monday-Friday 8 AM - 5 PM) [phone] After 5 PM and follow prompts

## 2018-10-01 ENCOUNTER — Ambulatory Visit (INDEPENDENT_AMBULATORY_CARE_PROVIDER_SITE_OTHER): Payer: Medicare Other | Admitting: Internal Medicine

## 2018-10-01 ENCOUNTER — Encounter: Payer: Self-pay | Admitting: Internal Medicine

## 2018-10-01 VITALS — BP 138/60 | HR 61 | Temp 98.5°F | Ht 62.0 in | Wt 134.0 lb

## 2018-10-01 DIAGNOSIS — Z23 Encounter for immunization: Secondary | ICD-10-CM | POA: Diagnosis not present

## 2018-10-01 DIAGNOSIS — M25512 Pain in left shoulder: Secondary | ICD-10-CM | POA: Diagnosis not present

## 2018-10-01 DIAGNOSIS — H00014 Hordeolum externum left upper eyelid: Secondary | ICD-10-CM

## 2018-10-01 DIAGNOSIS — M810 Age-related osteoporosis without current pathological fracture: Secondary | ICD-10-CM | POA: Diagnosis not present

## 2018-10-01 DIAGNOSIS — I701 Atherosclerosis of renal artery: Secondary | ICD-10-CM | POA: Insufficient documentation

## 2018-10-01 DIAGNOSIS — L240 Irritant contact dermatitis due to detergents: Secondary | ICD-10-CM

## 2018-10-01 DIAGNOSIS — F0150 Vascular dementia without behavioral disturbance: Secondary | ICD-10-CM | POA: Diagnosis not present

## 2018-10-01 DIAGNOSIS — F324 Major depressive disorder, single episode, in partial remission: Secondary | ICD-10-CM | POA: Diagnosis not present

## 2018-10-01 DIAGNOSIS — S46012A Strain of muscle(s) and tendon(s) of the rotator cuff of left shoulder, initial encounter: Secondary | ICD-10-CM | POA: Diagnosis not present

## 2018-10-01 DIAGNOSIS — M7542 Impingement syndrome of left shoulder: Secondary | ICD-10-CM | POA: Diagnosis not present

## 2018-10-01 MED ORDER — TRIAMCINOLONE ACETONIDE 0.025 % EX OINT: 1.0000 "application " | TOPICAL_OINTMENT | Freq: Two times a day (BID) | CUTANEOUS | 1 refills | Status: DC

## 2018-10-01 MED ORDER — DONEPEZIL HCL 10 MG PO TABS: 10.0000 mg | ORAL_TABLET | Freq: Every day | ORAL | 3 refills | Status: AC

## 2018-10-01 NOTE — Progress Notes (Signed)
Location:  [facility] clinic Provider:  Jereme Loren L. Mariea Clonts, D.O., C.M.D.  Code Status: DNR  Goals of Care:  Advanced Directives 05/29/2018  Does Patient Have a Medical Advance Directive? Yes  Type of Advance Directive [place]  Does patient want to make changes to medical advance directive? No - Patient declined  Copy of [place] in Chart? Yes  Would patient like information on creating a medical advance directive? -     Chief Complaint  Patient presents with  . Medical Management of Chronic Issues    medication follow-up    HPI: Patient is a 84 y.o. female seen today for medical management of chronic diseases.    She had a shoulder rash a couple of weeks ago.  They thought it was from using regular gain instead of all free and clear.  All of the cream is used up.    Says she is doing pretty good.  Says sometimes there is a lot to do and it's fun and sometimes it's boring.  She is going to [facility] to visit her sister next week.    No recent nauseousness or indigestion.  Weight is back up to 134 lbs now.  She had been down as low ast 123.4 in January.    BP is good.  HR ok.    Back hurts her "kinda bad" but she's been walking with the walking club. Being more active.  Has tylenol as needed.    She is occasionally forgetting her children's names.    Spirits are better.  She's getting out of her room and doing things again.  Is going to get her next prolia in Dec for osteoporosis.  Is on Vit D3.    Past Medical History:  Diagnosis Date  . Abnormal weight loss 05/28/2004  . Anxiety 01/17/2003  . Back pain 08/19/2005   with radiculopathy  . Bradycardia, drug induced    Diltiazem  . Cerebral atherosclerosis 09/24/1999  . Chest pain, atypical 04/2016   minor CAD at cath   . CHF (congestive heart failure) ([place])   . Chronic anticoagulation    Eliquis  . Dementia ([place])    "don't know kind or stage" (05/02/2016)  . Depression 09/06/2002  .  Diverticulitis 09/28/2007  . External hemorrhoids 04/12/2009  . Female climacteric state 03/04/2000  . 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 GERD (gastroesophageal reflux disease)   . Hypertensive cardiovascular disease    moderate LVH on echo march 2017  . Macular degeneration   . Malaise and fatigue   . Neurocysticercosis    C[facility]iotomy at [facility] in early 2000s  . Osteoarthrosis, localized   . Osteoporosis 05/28/2004  . Paranoia ([place])   . Paroxysmal A-fib ([place])   . TIA (transient ischemic attack) 2000s?  [name] Urinary incontinence 08/18/2007  . UTI (urinary tract infection)   . Vaginitis, atrophic   . Vertigo, peripheral 10/07/2000    Past Surgical History:  Procedure Laterality Date  . BRAIN SURGERY  2000   Neurocysticercosis ("parasite")  . CARDIAC CATHETERIZATION N/A 05/03/2016   Procedure: Left Heart Cath and Coronary Angiography;  Surgeon: Sherren Mocha, MD;  Location: [place] CV LAB;  Service: Cardiovascular;  Laterality: N/A;  . CARDIOVERSION N/A 07/14/2017   Procedure: CARDIOVERSION;  Surgeon: Pixie Casino, MD;  Location: [facility] ENDOSCOPY;  Service: Cardiovascular;  Laterality: N/A;  . CATARACT EXTRACTION  2013  . CATARACT EXTRACTION, BILATERAL     "  not sure if both eyes; feel like it probably was"  . JOINT REPLACEMENT    . PERIPHERAL VASCULAR CATHETERIZATION N/A 05/03/2016   Procedure: Abdominal Aortogram;  Surgeon: Sherren Mocha, MD;  Location: [place] CV LAB;  Service: Cardiovascular;  Laterality: N/A;  . SHOULDER ARTHROSCOPY W/ ROTATOR CUFF REPAIR Right X 3  . TEE WITHOUT CARDIOVERSION N/A 07/14/2017   Procedure: TRANSESOPHAGEAL ECHOCARDIOGRAM (TEE);  Surgeon: Pixie Casino, MD;  Location: [place];  Service: Cardiovascular;  Laterality: N/A;  . TOTAL KNEE ARTHROPLASTY Left 1990  . VAGINAL HYSTERECTOMY  1960    Allergies  Allergen Reactions  . Bactrim [Sulfamethoxazole-Trimethoprim] Nausea Only  .  Amiodarone Itching  . Amoxicillin Other (See Comments)    Unknown- ? Upset stomach  . Demerol [Meperidine] Other (See Comments)    Hallucinations  . Hydralazine Other (See Comments)    Tingling and chest pain   . Lisinopril Cough  . Zoloft [Sertraline Hcl] Other (See Comments)    Unknown  . Tape Rash and Other (See Comments)    Can tear the skin, also    Outpatient Encounter Medications as of 10/01/2018  Medication Sig  . acetaminophen (TYLENOL) 325 MG tablet Take 650 mg by mouth every 6 (six) hours as needed for mild pain.  [name] amLODipine (NORVASC) 10 MG tablet Take 1 tablet (10 mg total) by mouth daily.  . Cholecalciferol (VITAMIN D3) 2000 units TABS Take 2,000 Units by mouth daily.   [name] docusate sodium (COLACE) 100 MG capsule Take 100 mg by mouth 2 (two) times daily.   [name] donepezil (ARICEPT) 10 MG tablet Take 1 tablet (10 mg total) by mouth daily.  [name] ELIQUIS 2.5 MG TABS tablet TAKE 1 TABLET TWICE DAILY  . furosemide (LASIX) 40 MG tablet Take 1-2 tablets (40-80 mg total) by mouth daily as directed.  [name] losartan (COZAAR) 25 MG tablet TAKE 1 TABLET EVERY DAY  . metoprolol tartrate (LOPRESSOR) 50 MG tablet Take 1 tablet (50 mg total) by mouth 2 (two) times daily.  [name] triamcinolone (KENALOG) 0.025 % ointment Apply 1 application topically 2 (two) times daily. For rash  . venlafaxine (EFFEXOR) 75 MG tablet Take 1 tablet (75 mg total) 2 (two) times daily by mouth.  . [DISCONTINUED] donepezil (ARICEPT) 10 MG tablet Take 1 tablet (10 mg total) by mouth daily.  . [DISCONTINUED] Melatonin 5 MG CAPS Take by mouth.  . [DISCONTINUED] pantoprazole (PROTONIX) 40 MG tablet Take 1 tablet (40 mg total) by mouth daily. One hour before breakfast   No facility-administered encounter medications on file as of 10/01/2018.     Review of Systems:  Review of Systems  Constitutional: Negative for chills and fever.  HENT: Negative for congestion.   Eyes: Negative for blurred vision.       Glasses    Respiratory: Negative for shortness of breath.   Cardiovascular: Negative for chest pain, palpitations and leg swelling.  Gastrointestinal: Negative for abdominal pain, diarrhea, heartburn, nausea and vomiting.  Genitourinary: Negative for dysuria and urgency.  Musculoskeletal: Positive for back pain. Negative for falls and joint pain.  Neurological: Negative for dizziness and loss of consciousness.  Psychiatric/Behavioral: Positive for memory loss. Negative for depression. The patient is not nervous/anxious and does not have insomnia.     Health Maintenance  Topic Date Due  . INFLUENZA VACCINE  07/09/2018  . TETANUS/TDAP  09/08/2022  . DEXA SCAN  Completed  . PNA vac Low Risk Adult  Completed    Physical Exam: Vitals:  10/01/18 1134  BP: 138/60  Pulse: 61  Temp: 98.5 F (36.9 C)  TempSrc: Oral  SpO2: 98%  Weight: 134 lb (60.8 kg)  Height: 5\' 2"  (1.575 m)   Body mass index is 24.51 kg/m. Physical Exam  Constitutional: She appears well-developed and well-nourished. No distress.  HENT:  Head: Normocephalic and atraumatic.  Cardiovascular: Normal heart sounds and intact distal pulses.  irreg irreg  Pulmonary/Chest: Effort normal and breath sounds normal. No respiratory distress.  Abdominal: Bowel sounds are normal.  Musculoskeletal: Normal range of motion.  Neurological: She is alert.  Oriented to person right now, but sometimes forgets childrens' names; [place] live [place]: Skin is warm and dry.  Psychiatric: She has a normal mood and affect.    Labs reviewed: Basic Metabolic Panel: Recent Labs    12/13/17 1840 12/14/17 0732 12/15/17 0226 12/16/17 0633 12/22/17 1059  NA  --  129* 133* 132* 135  K  --  4.4 4.2 4.2 3.7  CL  --  97* 100* 99* 99  CO2  --  21* 23 24 27   GLUCOSE  --  83 68 91 115  BUN  --  19 21* 18 22  CREATININE  --  1.28* 1.62* 1.54* 1.50*  CALCIUM  --  8.7* 8.1* 8.6* 9.2  MG  --  1.8  --   --   --   TSH  8.725*  --   --   --   --    Liver Function Tests: Recent Labs    12/03/17 1016  AST 28  ALT 22  BILITOT 1.1  PROT 7.1   Recent Labs    12/03/17 1016  LIPASE 39  AMYLASE 36   No results for input(s): AMMONIA in the last 8760 hours. CBC: Recent Labs    10/28/17 1105 12/03/17 1016  12/15/17 0226 12/16/17 0633 02/26/18 1042  WBC 5.7 6.1   < > 6.4 6.3 5.4  NEUTROABS 4,520 4,709  --   --   --  3,883  HGB 10.6* 12.9   < > 10.3* 11.6* 11.3*  HCT 32.0* 38.2   < > 33.0* 37.4 34.2*  MCV 86.5 87.6   < > 91.2 91.4 89.8  PLT 198 267   < > 221 255 287   < > = values in this interval not displayed.   Lipid Panel: No results for input(s): CHOL, HDL, LDLCALC, TRIG, CHOLHDL, LDLDIRECT in the last 8760 hours. Lab Results  Component Value Date   HGBA1C 6.2 (H) 05/02/2016    Assessment/Plan 1. Contact dermatitis and eczema due to detergents - triamcinolone (KENALOG) 0.025 % ointment; Apply 1 application topically 2 (two) times daily. For rash  Dispense: 30 g; Refill: 1  2. Major depressive disorder with single episode, in partial remission ([place]) -improved mood recently, cont same regimen  3. Atherosclerosis of renal artery ([place]) -noted on imaging  4. Need for influenza vaccination - Flu vaccine HIGH DOSE PF (Fluzone High dose)  5. Osteoporosis, unspecified osteoporosis type, unspecified pathological fracture presence -next prolia in dec, cont vitamin D3  6. Vascular dementia without behavioral disturbance ([place]) -progressing, now forgetting children's names, somehow still lives in [place] facility[facility]  7. Hordeolum externum of left upper eyelid -advised warm compresses for 20 mins tid -if not getting better, f/u Dr. Baird Cancer ophthalmologist for lancing (general eye doctor is optometrist by report)  Labs/tests ordered:   Orders Placed This Encounter  Procedures  . Flu vaccine HIGH  DOSE PF (Fluzone High dose)    Next appt:  11/30/2018   Demonte Dobratz L. Jaevian Shean,  D.O. [facility] [address], [place] [address] Cell Phone (Mon-Fri 8am-5pm):  [phone] On Call:  [phone] & follow prompts after 5pm & weekends Office Phone:  [phone] Office Fax:  [phone]

## 2018-10-01 NOTE — Patient Instructions (Signed)
Use warm compresses for 20 minutes three times a day for the stye on left upper lid.

## 2018-10-19 ENCOUNTER — Telehealth: Payer: Self-pay | Admitting: *Deleted

## 2018-10-19 DIAGNOSIS — L240 Irritant contact dermatitis due to detergents: Secondary | ICD-10-CM

## 2018-10-19 NOTE — Telephone Encounter (Signed)
[name], Caregiver called and stated that patient's skin rash is no better. Stated that the ointment is not working and it is spreading. Requesting a referral to Dermatologist. Please Advise.

## 2018-10-19 NOTE — Telephone Encounter (Signed)
OK.  It had been noted to be better when she was here after the ointment originally prescribed by Dr. Eulas Post.  I just refilled it.  I didn't really see much when she was here.  Will send referral.

## 2018-10-22 ENCOUNTER — Ambulatory Visit: Payer: Self-pay | Admitting: Internal Medicine

## 2018-10-22 ENCOUNTER — Ambulatory Visit (INDEPENDENT_AMBULATORY_CARE_PROVIDER_SITE_OTHER): Payer: Medicare Other | Admitting: Internal Medicine

## 2018-10-22 ENCOUNTER — Encounter: Payer: Self-pay | Admitting: Internal Medicine

## 2018-10-22 VITALS — BP 118/60 | HR 49 | Temp 97.7°F | Ht 62.0 in | Wt 134.4 lb

## 2018-10-22 DIAGNOSIS — L259 Unspecified contact dermatitis, unspecified cause: Secondary | ICD-10-CM | POA: Diagnosis not present

## 2018-10-22 DIAGNOSIS — I701 Atherosclerosis of renal artery: Secondary | ICD-10-CM

## 2018-10-22 MED ORDER — BETAMETHASONE DIPROPIONATE 0.05 % EX OINT: TOPICAL_OINTMENT | Freq: Two times a day (BID) | CUTANEOUS | 3 refills | Status: DC

## 2018-10-22 NOTE — Progress Notes (Signed)
Location:  [facility] clinic Provider: Salihah Peckham L. Mariea Clonts, D.O., C.M.D.  Goals of Care:  Advanced Directives 10/22/2018  Does Patient Have a Medical Advance Directive? Yes  Type of Advance Directive [place]  Does patient want to make changes to medical advance directive? No - Patient declined  Copy of [place] in Chart? Yes - validated most recent copy scanned in chart (See row information)  Would patient like information on creating a medical advance directive? -   Chief Complaint  Patient presents with  . Acute Visit    breaking out on shoulders, chest, back itching,symptoms for 2 months.     HPI: Patient is a 84 y.o. female seen today for an acute visit for rash for 2 months.  Was seen before and given triamcinlone ointment which helped before.  Then family called back and said it was not helping and requested derm referral--appt already made in Jan.  Now here again for this.   No changes now with shampoo/beauty products/detergent in sheets, etc. Did not get better after all from the change in detergent to scent free.  They also changed her now to a cetaphil body wash which has not eped either.  It's a papular rash, erythematous, pruritic on chest, upper back, not on arms or legs.  She is getting up and showering to help the itching.    Past Medical History:  Diagnosis Date  . Abnormal weight loss 05/28/2004  . Anxiety 01/17/2003  . Back pain 08/19/2005   with radiculopathy  . Bradycardia, drug induced    Diltiazem  . Cerebral atherosclerosis 09/24/1999  . Chest pain, atypical 04/2016   minor CAD at cath   . CHF (congestive heart failure) ([place])   . Chronic anticoagulation    Eliquis  . Dementia ([place])    "don't know kind or stage" (05/02/2016)  . Depression 09/06/2002  . Diverticulitis 09/28/2007  . External hemorrhoids 04/12/2009  . Female climacteric state 03/04/2000  . Formed visual hallucinations    [name] Syndrome?  Failed Keppra and  Depakote  . GERD (gastroesophageal reflux disease)   . Hypertensive cardiovascular disease    moderate LVH on echo march 2017  . Macular degeneration   . Malaise and fatigue   . Neurocysticercosis    Craniotomy at [facility] in early 2000s  . Osteoarthrosis, localized   . Osteoporosis 05/28/2004  . Paranoia ([place])   . Paroxysmal A-fib ([place])   . TIA (transient ischemic attack) 2000s?  [name] Urinary incontinence 08/18/2007  . UTI (urinary tract infection)   . Vaginitis, atrophic   . Vertigo, peripheral 10/07/2000    Past Surgical History:  Procedure Laterality Date  . BRAIN SURGERY  2000   Neurocysticercosis ("parasite")  . CARDIAC CATHETERIZATION N/A 05/03/2016   Procedure: Left Heart Cath and Coronary Angiography;  Surgeon: Sherren Mocha, MD;  Location: [place] CV LAB;  Service: Cardiovascular;  Laterality: N/A;  . CARDIOVERSION N/A 07/14/2017   Procedure: CARDIOVERSION;  Surgeon: Pixie Casino, MD;  Location: [facility] ENDOSCOPY;  Service: Cardiovascular;  Laterality: N/A;  . CATARACT EXTRACTION  2013  . CATARACT EXTRACTION, BILATERAL     "not sure if both eyes; feel like it probably was"  . JOINT REPLACEMENT    . PERIPHERAL VASCULAR CATHETERIZATION N/A 05/03/2016   Procedure: Abdominal Aortogram;  Surgeon: Sherren Mocha, MD;  Location: [place] CV LAB;  Service: Cardiovascular;  Laterality: N/A;  . SHOULDER ARTHROSCOPY W/ ROTATOR CUFF REPAIR Right X 3  . TEE  WITHOUT CARDIOVERSION N/A 07/14/2017   Procedure: TRANSESOPHAGEAL ECHOCARDIOGRAM (TEE);  Surgeon: Pixie Casino, MD;  Location: [place];  Service: Cardiovascular;  Laterality: N/A;  . TOTAL KNEE ARTHROPLASTY Left 1990  . VAGINAL HYSTERECTOMY  1960    Allergies  Allergen Reactions  . Bactrim [Sulfamethoxazole-Trimethoprim] Nausea Only  . Amiodarone Itching  . Amoxicillin Other (See Comments)    Unknown- ? Upset stomach  . Demerol [Meperidine] Other (See Comments)    Hallucinations  . Hydralazine Other (See Comments)      Tingling and chest pain   . Lisinopril Cough  . Zoloft [Sertraline Hcl] Other (See Comments)    Unknown  . Tape Rash and Other (See Comments)    Can tear the skin, also    Outpatient Encounter Medications as of 10/22/2018  Medication Sig  . acetaminophen (TYLENOL) 325 MG tablet Take 650 mg by mouth every 6 (six) hours as needed for mild pain.  [name] amLODipine (NORVASC) 10 MG tablet Take 1 tablet (10 mg total) by mouth daily.  . Cholecalciferol (VITAMIN D3) 2000 units TABS Take 2,000 Units by mouth daily.   [name] docusate sodium (COLACE) 100 MG capsule Take 100 mg by mouth 2 (two) times daily.   [name] donepezil (ARICEPT) 10 MG tablet Take 1 tablet (10 mg total) by mouth daily.  [name] ELIQUIS 2.5 MG TABS tablet TAKE 1 TABLET TWICE DAILY  . furosemide (LASIX) 40 MG tablet Take 1-2 tablets (40-80 mg total) by mouth daily as directed.  [name] losartan (COZAAR) 25 MG tablet TAKE 1 TABLET EVERY DAY  . metoprolol tartrate (LOPRESSOR) 50 MG tablet Take 1 tablet (50 mg total) by mouth 2 (two) times daily.  [name] venlafaxine (EFFEXOR) 75 MG tablet Take 1 tablet (75 mg total) 2 (two) times daily by mouth.  . [DISCONTINUED] triamcinolone (KENALOG) 0.025 % ointment Apply 1 application topically 2 (two) times daily. For rash   No facility-administered encounter medications on file as of 10/22/2018.     Review of Systems:  Review of Systems  Constitutional: Negative for chills and fever.  Cardiovascular: Negative for chest pain and palpitations.  Skin: Positive for itching and rash.  Psychiatric/Behavioral: Positive for memory loss.    Health Maintenance  Topic Date Due  . TETANUS/TDAP  09/08/2022  . INFLUENZA VACCINE  Completed  . DEXA SCAN  Completed  . PNA vac Low Risk Adult  Completed    Physical Exam: Vitals:   10/22/18 0930  BP: 118/60  Pulse: (!) 49  Temp: 97.7 F (36.5 C)  TempSrc: Oral  SpO2: 99%  Weight: 134 lb 6.4 oz (61 kg)  Height: 5\' 2"  (1.575 m)   Body mass index is 24.58  kg/m. Physical Exam  Constitutional: She appears well-developed and well-nourished. No distress.  Cardiovascular: Normal rate, regular rhythm, normal heart sounds and intact distal pulses.  Pulmonary/Chest: Effort normal and breath sounds normal. No respiratory distress.  Musculoskeletal:  Ambulating without assistive device  Neurological: She is alert.  Skin: Rash noted.  Papular rash with erythema on chest and back; upper right chest above breast is worst area at present; evidence of excoriation present  Psychiatric: She has a normal mood and affect.    Labs reviewed: Basic Metabolic Panel: Recent Labs    12/13/17 1840 12/14/17 0732 12/15/17 0226 12/16/17 0633 12/22/17 1059  NA  --  129* 133* 132* 135  K  --  4.4 4.2 4.2 3.7  CL  --  97* 100* 99* 99  CO2  --  21* 23 24 27   GLUCOSE  --  83 68 91 115  BUN  --  19 21* 18 22  CREATININE  --  1.28* 1.62* 1.54* 1.50*  CALCIUM  --  8.7* 8.1* 8.6* 9.2  MG  --  1.8  --   --   --   TSH 8.725*  --   --   --   --    Liver Function Tests: Recent Labs    12/03/17 1016  AST 28  ALT 22  BILITOT 1.1  PROT 7.1   Recent Labs    12/03/17 1016  LIPASE 39  AMYLASE 36   No results for input(s): AMMONIA in the last 8760 hours. CBC: Recent Labs    10/28/17 1105 12/03/17 1016  12/15/17 0226 12/16/17 0633 02/26/18 1042  WBC 5.7 6.1   < > 6.4 6.3 5.4  NEUTROABS 4,520 4,709  --   --   --  3,883  HGB 10.6* 12.9   < > 10.3* 11.6* 11.3*  HCT 32.0* 38.2   < > 33.0* 37.4 34.2*  MCV 86.5 87.6   < > 91.2 91.4 89.8  PLT 198 267   < > 221 255 287   < > = values in this interval not displayed.   Lipid Panel: No results for input(s): CHOL, HDL, LDLCALC, TRIG, CHOLHDL, LDLDIRECT in the last 8760 hours. Lab Results  Component Value Date   HGBA1C 6.2 (H) 05/02/2016    Assessment/Plan 1. Contact dermatitis and eczema -unclear what the rash really is, but seemed at first to respond to triamcinolone so will try a different steroid  ointment and she needs to be using cetaphil cream after bathing and to apply this new ointment bid - betamethasone dipropionate (DIPROLENE) 0.05 % ointment; Apply topically 2 (two) times daily.  Dispense: 45 g; Refill: 3 -keep derm appt  Labs/tests ordered:  No orders of the defined types were placed in this encounter.  Next appt:  11/30/2018  Laurabeth Yip L. Dan Dissinger, D.O. [facility] [address], [place] [address] Cell Phone (Mon-Fri 8am-5pm):  [phone] On Call:  [phone] & follow prompts after 5pm & weekends Office Phone:  [phone] Office Fax:  [phone]

## 2018-10-22 NOTE — Patient Instructions (Addendum)
Cause unclear.   Continue to wash with cetaphil body wash and use cetaphil cream after bathing. Apply the betamethasone ointment to the affected area twice a day in am and bedtime.  Keep Dermatology appt.  Contact Dermatitis Dermatitis is redness, soreness, and swelling (inflammation) of the skin. Contact dermatitis is a reaction to certain substances that touch the skin. There are two types of contact dermatitis:  Irritant contact dermatitis. This type is caused by something that irritates your skin, such as dry hands from washing them too much. This type does not require previous exposure to the substance for a reaction to occur. This type is more common.  Allergic contact dermatitis. This type is caused by a substance that you are allergic to, such as a nickel allergy or poison ivy. This type only occurs if you have been exposed to the substance (allergen) before. Upon a repeat exposure, your body reacts to the substance. This type is less common.  What are the causes? Many different substances can cause contact dermatitis. Irritant contact dermatitis is most commonly caused by exposure to:  Makeup.  Soaps.  Detergents.  Bleaches.  Acids.  Metal salts, such as nickel.  Allergic contact dermatitis is most commonly caused by exposure to:  Poisonous plants.  Chemicals.  Jewelry.  Latex.  Medicines.  Preservatives in products, such as clothing.  What increases the risk? This condition is more likely to develop in:  People who have jobs that expose them to irritants or allergens.  People who have certain medical conditions, such as asthma or eczema.  What are the signs or symptoms? Symptoms of this condition may occur anywhere on your body where the irritant has touched you or is touched by you. Symptoms include:  Dryness or flaking.  Redness.  Cracks.  Itching.  Pain or a burning feeling.  Blisters.  Drainage of small amounts of blood or clear fluid from  skin cracks.  With allergic contact dermatitis, there may also be swelling in areas such as the eyelids, mouth, or genitals. How is this diagnosed? This condition is diagnosed with a medical history and physical exam. A patch skin test may be performed to help determine the cause. If the condition is related to your job, you may need to see an occupational medicine specialist. How is this treated? Treatment for this condition includes figuring out what caused the reaction and protecting your skin from further contact. Treatment may also include:  Steroid creams or ointments. Oral steroid medicines may be needed in more severe cases.  Antibiotics or antibacterial ointments, if a skin infection is present.  Antihistamine lotion or an antihistamine taken by mouth to ease itching.  A bandage (dressing).  Follow these instructions at home: [place] your skin as needed.  Apply cool compresses to the affected areas.  Try taking a bath with: ? Epsom salts. Follow the instructions on the packaging. You can get these at your local pharmacy or grocery store. ? Baking soda. Pour a small amount into the bath as directed by your health care provider. ? Colloidal oatmeal. Follow the instructions on the packaging. You can get this at your local pharmacy or grocery store.  Try applying baking soda paste to your skin. Stir water into baking soda until it reaches a paste-like consistency.  Do not scratch your skin.  Bathe less frequently, such as every other day.  Bathe in lukewarm water. Avoid using hot water. Medicines  Take or apply over-the-counter and prescription medicines only  as told by your health care provider.  If you were prescribed an antibiotic medicine, take or apply your antibiotic as told by your health care provider. Do not stop using the antibiotic even if your condition starts to improve. General instructions  Keep all follow-up visits as told by your health  care provider. This is important.  Avoid the substance that caused your reaction. If you do not know what caused it, keep a journal to try to track what caused it. Write down: ? What you eat. ? What cosmetic products you use. ? What you drink. ? What you wear in the affected area. This includes jewelry.  If you were given a dressing, take care of it as told by your health care provider. This includes when to change and remove it. Contact a health care provider if:  Your condition does not improve with treatment.  Your condition gets worse.  You have signs of infection such as swelling, tenderness, redness, soreness, or warmth in the affected area.  You have a fever.  You have new symptoms. Get help right away if:  You have a severe headache, neck pain, or neck stiffness.  You vomit.  You feel very sleepy.  You notice red streaks coming from the affected area.  Your bone or joint underneath the affected area becomes painful after the skin has healed.  The affected area turns darker.  You have difficulty breathing. This information is not intended to replace advice given to you by your health care provider. Make sure you discuss any questions you have with your health care provider. Document Released: 11/22/2000 Document Revised: 05/02/2016 Document Reviewed: 04/12/2015 Elsevier Interactive Patient Education  2018 [employer].

## 2018-11-13 ENCOUNTER — Telehealth: Payer: Self-pay

## 2018-11-13 DIAGNOSIS — M6281 Muscle weakness (generalized): Secondary | ICD-10-CM

## 2018-11-13 DIAGNOSIS — R54 Age-related physical debility: Secondary | ICD-10-CM

## 2018-11-13 MED ORDER — UNABLE TO FIND: 0 refills | Status: DC

## 2018-11-13 NOTE — Telephone Encounter (Signed)
Left message with [name]'s husband informing him order is ready for pick-up

## 2018-11-13 NOTE — Telephone Encounter (Signed)
Order signed and should be printed.

## 2018-11-13 NOTE — Telephone Encounter (Signed)
Patient's daughter called requesting an order for a lift chair.  Brittney Gutierrez would like a call once order signed and ready for pick-up  Order pending, please associate with the appropriate diagnosis

## 2018-11-20 ENCOUNTER — Ambulatory Visit (INDEPENDENT_AMBULATORY_CARE_PROVIDER_SITE_OTHER): Payer: Medicare Other | Admitting: Cardiovascular Disease

## 2018-11-20 ENCOUNTER — Encounter: Payer: Self-pay | Admitting: Cardiovascular Disease

## 2018-11-20 VITALS — BP 120/60 | HR 61 | Ht 61.0 in | Wt 132.0 lb

## 2018-11-20 DIAGNOSIS — I480 Paroxysmal atrial fibrillation: Secondary | ICD-10-CM | POA: Diagnosis not present

## 2018-11-20 DIAGNOSIS — L282 Other prurigo: Secondary | ICD-10-CM | POA: Diagnosis not present

## 2018-11-20 DIAGNOSIS — I495 Sick sinus syndrome: Secondary | ICD-10-CM | POA: Diagnosis not present

## 2018-11-20 DIAGNOSIS — I10 Essential (primary) hypertension: Secondary | ICD-10-CM

## 2018-11-20 DIAGNOSIS — I701 Atherosclerosis of renal artery: Secondary | ICD-10-CM

## 2018-11-20 DIAGNOSIS — I5032 Chronic diastolic (congestive) heart failure: Secondary | ICD-10-CM | POA: Diagnosis not present

## 2018-11-20 DIAGNOSIS — Z7901 Long term (current) use of anticoagulants: Secondary | ICD-10-CM | POA: Diagnosis not present

## 2018-11-20 NOTE — Progress Notes (Signed)
Patient ID: Brittney Gutierrez, female   DOB: 1927/12/01, 84 y.o.   MRN: [id number]    Cardiology Office Note    Date:  11/20/2018   ID:  Thea Holshouser, DOB 1927/08/10, MRN [id number]  PCP:  Gayland Curry, DO  Cardiologist:   Sanda Klein, MD   Chief Complaint  Patient presents with  . Follow-up    no chest pain, shortness of breath, pain or cramping in legs, lightheaded or dizziness  Follow-up after hospital visit for heart failure exacerbation  History of Present Illness:  Lyrical Sowle is a 84 y.o. female with chronic diastolic heart failure, systemic hypertension, chronic kidney disease stage III and problems with both sinus bradycardia and paroxysmal atrial fibrillation with rapid ventricular response. She has requested conservative management, without pacemaker implantation.  She has had a very quiet year without need for hospitalization or adjustment in her diuretic dose.  She has not had syncope or dizziness.  She denies leg edema.  She has had a few falls (loss of balance or trips, no syncope).  None of these falls have been complicated by head impact or bleeding.  Denies palpitations.  Has never complained of angina.  She has had a pruritic rash on her torso that has not responded to topical corticosteroids.  Mrs. Lehenbauer was hospitalized January 5-January 8 for heart failure exacerbation. She presented with acute hypoxic respiratory failure and improved with diuretics. She was in normal sinus rhythm on admission and remained in sinus rhythm throughout the hospitalization. She did not have angina and had a marginal increase in cardiac troponin at 0.03. She did have bilateral lower extremity edema, without any evidence of DVT on vascular ultrasound performed on January 6. Cardiology was not consulted during that admission.  After hospital discharge she developed some ankle edema and reportedly a 6 pound weight gain last weekend. She took a double dose of furosemide for 3 days  with improvement in the edema. She does not have any swelling today and denies any dyspnea either at rest or with activity.  Weight has been very stable around 130 pounds, give or take a couple of pounds.  Labs performed on the current dose of furosemide, and generally 14, showed a potassium of 3.7 and creatinine around 1.5, her baseline.  The patient specifically denies any chest pain at rest exertion, dyspnea at rest or with exertion, orthopnea, paroxysmal nocturnal dyspnea, syncope, palpitations, focal neurological deficits, intermittent claudication, lower extremity edema, unexplained weight gain, cough, hemoptysis or wheezing.  She has echo evidence of hypertensive heart disease with left ventricular hypertrophy but has not had overt congestive heart failure. She had minor coronary atherosclerosis at cath. In May 2017 her LDL was 120 She has a questionable hx of TIA, she has a CHA2DS2Vasc score of at least 4 (if + TIA would be 6). Myoview in Sept 2016 showed no ischemia. Cardiac catheterization May 2017 showed minor plaque in all 3 coronary arteries without significant coronary stenoses. During that same study she had bilateral renal angiography showing mild to moderate right renal artery stenosis. Echo August 2018 showed preserved LVF, but diastolic function could not be analyzed due to atrial fibrillation. The report was of mild mitral regurgitation. Left atrium was moderately dilated.. TEE in August 2018 showed moderate mitral regurgitation. Amiodarone was used briefly but was discontinued due to itching. Simultaneous treatment with metoprolol and diltiazem was associated with significant bradycardia.   Past Medical History:  Diagnosis Date  . Abnormal weight loss 05/28/2004  . Anxiety 01/17/2003  .  Back pain 08/19/2005   with radiculopathy  . Bradycardia, drug induced    Diltiazem  . Cerebral atherosclerosis 09/24/1999  . Chest pain, atypical 04/2016   minor CAD at cath   . CHF (congestive  heart failure) ([place])   . Chronic anticoagulation    Eliquis  . Dementia ([place])    "don't know kind or stage" (05/02/2016)  . Depression 09/06/2002  . Diverticulitis 09/28/2007  . External hemorrhoids 04/12/2009  . Female climacteric state 03/04/2000  . 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 GERD (gastroesophageal reflux disease)   . Hypertensive cardiovascular disease    moderate LVH on echo march 2017  . Macular degeneration   . Malaise and fatigue   . Neurocysticercosis    Craniotomy at [facility] in early 2000s  . Osteoarthrosis, localized   . Osteoporosis 05/28/2004  . Paranoia ([place])   . Paroxysmal A-fib ([place])   . TIA (transient ischemic attack) 2000s?  [name] Urinary incontinence 08/18/2007  . UTI (urinary tract infection)   . Vaginitis, atrophic   . Vertigo, peripheral 10/07/2000    Past Surgical History:  Procedure Laterality Date  . BRAIN SURGERY  2000   Neurocysticercosis ("parasite")  . CARDIAC CATHETERIZATION N/A 05/03/2016   Procedure: Left Heart Cath and Coronary Angiography;  Surgeon: Sherren Mocha, MD;  Location: [place] CV LAB;  Service: Cardiovascular;  Laterality: N/A;  . CARDIOVERSION N/A 07/14/2017   Procedure: CARDIOVERSION;  Surgeon: Pixie Casino, MD;  Location: [facility] ENDOSCOPY;  Service: Cardiovascular;  Laterality: N/A;  . CATARACT EXTRACTION  2013  . CATARACT EXTRACTION, BILATERAL     "not sure if both eyes; feel like it probably was"  . JOINT REPLACEMENT    . PERIPHERAL VASCULAR CATHETERIZATION N/A 05/03/2016   Procedure: Abdominal Aortogram;  Surgeon: Sherren Mocha, MD;  Location: [place] CV LAB;  Service: Cardiovascular;  Laterality: N/A;  . SHOULDER ARTHROSCOPY W/ ROTATOR CUFF REPAIR Right X 3  . TEE WITHOUT CARDIOVERSION N/A 07/14/2017   Procedure: TRANSESOPHAGEAL ECHOCARDIOGRAM (TEE);  Surgeon: Pixie Casino, MD;  Location: [place];  Service: Cardiovascular;  Laterality: N/A;  . TOTAL KNEE  ARTHROPLASTY Left 1990  . VAGINAL HYSTERECTOMY  1960    Current Medications: Outpatient Medications Prior to Visit  Medication Sig Dispense Refill  . acetaminophen (TYLENOL) 325 MG tablet Take 650 mg by mouth every 6 (six) hours as needed for mild pain.    [name] amLODipine (NORVASC) 10 MG tablet Take 1 tablet (10 mg total) by mouth daily. 90 tablet 1  . betamethasone dipropionate (DIPROLENE) 0.05 % ointment Apply topically 2 (two) times daily. 45 g 3  . Cholecalciferol (VITAMIN D3) 2000 units TABS Take 2,000 Units by mouth daily.     [name] docusate sodium (COLACE) 100 MG capsule Take 100 mg by mouth 2 (two) times daily.     [name] donepezil (ARICEPT) 10 MG tablet Take 1 tablet (10 mg total) by mouth daily. 90 tablet 3  . ELIQUIS 2.5 MG TABS tablet TAKE 1 TABLET TWICE DAILY 180 tablet 1  . furosemide (LASIX) 40 MG tablet Take 1-2 tablets (40-80 mg total) by mouth daily as directed. 135 tablet 3  . losartan (COZAAR) 25 MG tablet TAKE 1 TABLET EVERY DAY 90 tablet 3  . metoprolol tartrate (LOPRESSOR) 50 MG tablet Take 1 tablet (50 mg total) by mouth 2 (two) times daily. 180 tablet 1  . UNABLE TO FIND Lift Chair due to proximal muscle weakness, frailty  1 each 0  . venlafaxine (EFFEXOR) 75 MG tablet Take 1 tablet (75 mg total) 2 (two) times daily by mouth. 180 tablet 3   No facility-administered medications prior to visit.      Allergies:   Bactrim [sulfamethoxazole-trimethoprim]; Amiodarone; Amoxicillin; Demerol [meperidine]; Hydralazine; Lisinopril; Zoloft [sertraline hcl]; and Tape   Social History   Socioeconomic History  . Marital status: Widowed    Spouse name: Not on file  . Number of children: Not on file  . Years of education: Not on file  . Highest education level: Not on file  Occupational History  . Not on file  Social Needs  . Financial resource strain: Not hard at all  . Food insecurity:    Worry: Never true    Inability: Never true  . Transportation needs:    Medical: No     Non-medical: No  Tobacco Use  . Smoking status: Never Smoker  . Smokeless tobacco: Never Used  Substance and Sexual Activity  . Alcohol use: No    Alcohol/week: 0.0 standard drinks  . Drug use: No  . Sexual activity: Never  Lifestyle  . Physical activity:    Days per week: 7 days    Minutes per session: 20 min  . Stress: To some extent  Relationships  . Social connections:    Talks on phone: More than three times a week    Gets together: More than three times a week    Attends religious service: Never    Active member of club or organization: No    Attends meetings of clubs or organizations: Never    Relationship status: Widowed  Other Topics Concern  . Not on file  Social History Narrative   DIET: None      DO YOU DRINK/EAT THINGS WITH CAFFEINE: yes      MARITAL STATUS: widow      WHAT YEAR WERE YOU MARRIED: 1948      DO YOU LIVE IN A HOUSE, APARTMENT, ASSISTED LIVING, CONDO TRAILER ETC.: Independent Living      IS IT ONE OR MORE STORIES: 1 story      HOW MANY PERSONS LIVE IN YOUR HOME: 1      DO YOU HAVE PETS IN YOUR HOME: no      CURRENT OR PAST PROFESSION: Book Keeper      DO YOU EXERCISE: no      WHAT TYPE AND HOW OFTEN:     Family History:  The patient's family history includes Heart attack in her father; Hypertension in her sister; Polycystic kidney disease in her mother.   ROS:   Please see the history of present illness.    ROS all other systems reviewed and are negative  PHYSICAL EXAM:   VS:  BP 120/60   Pulse 61   Ht 5\' 1"  (1.549 m)   Wt 132 lb (59.9 kg)   BMI 24.94 kg/m      General: Alert, oriented x3, no distress Head: no evidence of trauma, PERRL, EOMI, no exophtalmos or lid lag, no myxedema, no xanthelasma; normal ears, nose and oropharynx Neck: normal jugular venous pulsations and no hepatojugular reflux; brisk carotid pulses without delay and no carotid bruits Chest: clear to auscultation, no signs of consolidation by percussion or  palpation, normal fremitus, symmetrical and full respiratory excursions Cardiovascular: normal position and quality of the apical impulse, regular rhythm, normal first and second heart sounds, no murmurs, rubs or gallops Abdomen: no tenderness or distention, no masses by  palpation, no abnormal pulsatility or arterial bruits, normal bowel sounds, no hepatosplenomegaly Extremities: no clubbing, cyanosis or edema; 2+ radial, ulnar and brachial pulses bilaterally; 2+ right femoral, posterior tibial and dorsalis pedis pulses; 2+ left femoral, posterior tibial and dorsalis pedis pulses; no subclavian or femoral bruits Neurological: grossly nonfocal Psych: Normal mood and affect   Wt Readings from Last 3 Encounters:  11/20/18 132 lb (59.9 kg)  10/22/18 134 lb 6.4 oz (61 kg)  10/01/18 134 lb (60.8 kg)    Studies/Labs Reviewed:   EKG:  EKG is ordered today.  It shows sinus rhythm with a single PAC and left anterior fascicular block, probable LVH as well Recent Labs: 12/03/2017: ALT 22 12/13/2017: B Natriuretic Peptide 1,190.9; TSH 8.725 12/14/2017: Magnesium 1.8 12/22/2017: BUN 22; Creat 1.50; Potassium 3.7; Sodium 135 02/26/2018: Hemoglobin 11.3; Platelets 287   Lipid Panel    Component Value Date/Time   CHOL 195 05/02/2016 0732   TRIG 49 05/02/2016 0732   HDL 65 05/02/2016 0732   CHOLHDL 3.0 05/02/2016 0732   VLDL 10 05/02/2016 0732   LDLCALC 120 (H) 05/02/2016 0732    ASSESSMENT:    1. Chronic diastolic heart failure (HCC)   2. Paroxysmal atrial fibrillation ([place])   3. Long term current use of anticoagulant   4. Essential hypertension   5. SSS (sick sinus syndrome) (HCC)   6. Pruritic rash      PLAN:  In order of problems listed above:  1. CHF: Appears clinically euvolemic.  Good functional status.  Dry weight of 130 pounds appears appropriate.  Continue daily weights.  Plans to have labs with primary care provider next month. 2. AFib: No clinical recurrence.  Asymptomatic.    3. Eliquis: tolerating the anticoagulant without serious bleeding.  Has had some falls, but this is not a frequent occurrence and she generally is able to avoid injury.  For the time being anticoagulation appears to remain appropriate. 4. HTN: Well-controlled. 5. SSS: She would like to avoid pacemaker implantation unless absolutely necessary. She is quite sedentary and does not appear to have any symptoms related to her bradycardia.  Avoid any additional agents with negative chronotropic effect. 6. Pruritus/rash: Planning to see a dermatologist.  I suggested we could exchange out her medication one by one with alternative drugs to see if this alleviates the rash.  For example try Xarelto or Pradaxa instead of Eliquis and we could use diltiazem or verapamil instead of beta-blocker.  No adjustments made today.  The patient's daughter is very pleased with her clinical progress otherwise, so she is reluctant to change medicines.    Medication Adjustments/Labs and Tests Ordered: Current medicines are reviewed at length with the patient today.  Concerns regarding medicines are outlined above.  Medication changes, Labs and Tests ordered today are listed in the Patient Instructions below. Patient Instructions  Medication Instructions:  Dr Sallyanne Kuster recommends that you continue on your current medications as directed. Please refer to the Current Medication list given to you today.  If you need a refill on your cardiac medications before your next appointment, please call your pharmacy.   Follow-Up: At [facility], you and your health needs are our priority.  As part of our continuing mission to provide you with exceptional heart care, we have created designated Provider Care Teams.  These Care Teams include your primary Cardiologist (physician) and Advanced Practice Providers (APPs -  Physician Assistants and Nurse Practitioners) who all work together to provide you with the care you need,  when you need  it. You will need a follow up appointment in 12 years. You may see Sanda Klein, MD or one of the following Advanced Practice Providers on your designated Care Team: [place], [place] . Fabian Sharp, PA-C . You will receive a reminder letter in the mail two months in advance. If you don't receive a letter, please call our office to schedule the follow-up appointment.    Signed, Sanda Klein, MD  11/20/2018 8:32 AM    [facility] [address], [place], [place]  [address] Phone: [phone]; Fax: [phone]

## 2018-11-20 NOTE — Patient Instructions (Signed)
Medication Instructions:  Dr Sallyanne Kuster recommends that you continue on your current medications as directed. Please refer to the Current Medication list given to you today.  If you need a refill on your cardiac medications before your next appointment, please call your pharmacy.   Follow-Up: At [facility], you and your health needs are our priority.  As part of our continuing mission to provide you with exceptional heart care, we have created designated Provider Care Teams.  These Care Teams include your primary Cardiologist (physician) and Advanced Practice Providers (APPs -  Physician Assistants and Nurse Practitioners) who all work together to provide you with the care you need, when you need it. You will need a follow up appointment in 12 years. You may see Sanda Klein, MD or one of the following Advanced Practice Providers on your designated Care Team: [place], [place] . Fabian Sharp, PA-C . You will receive a reminder letter in the mail two months in advance. If you don't receive a letter, please call our office to schedule the follow-up appointment.

## 2018-11-26 ENCOUNTER — Inpatient Hospital Stay (HOSPITAL_COMMUNITY)
Admission: EM | Admit: 2018-11-26 | Discharge: 2018-11-29 | DRG: 552 | Disposition: A | Discharge status: Home Health | Payer: Medicare Other | Attending: Internal Medicine | Admitting: Internal Medicine

## 2018-11-26 ENCOUNTER — Ambulatory Visit (INDEPENDENT_AMBULATORY_CARE_PROVIDER_SITE_OTHER): Payer: Medicare Other | Admitting: Nurse Practitioner

## 2018-11-26 ENCOUNTER — Encounter: Payer: Self-pay | Admitting: Nurse Practitioner

## 2018-11-26 ENCOUNTER — Emergency Department (HOSPITAL_COMMUNITY): Payer: Medicare Other

## 2018-11-26 ENCOUNTER — Encounter (HOSPITAL_COMMUNITY): Payer: Self-pay

## 2018-11-26 ENCOUNTER — Telehealth: Payer: Self-pay | Admitting: *Deleted

## 2018-11-26 ENCOUNTER — Ambulatory Visit
Admission: RE | Admit: 2018-11-26 | Discharge: 2018-11-26 | Disposition: A | Discharge status: Home / Self Care | Payer: Medicare Other | Source: Ambulatory Visit | Attending: Nurse Practitioner | Admitting: Nurse Practitioner

## 2018-11-26 VITALS — BP 118/60 | HR 60 | Temp 97.5°F | Ht 61.0 in | Wt 131.0 lb

## 2018-11-26 DIAGNOSIS — K219 Gastro-esophageal reflux disease without esophagitis: Secondary | ICD-10-CM | POA: Diagnosis present

## 2018-11-26 DIAGNOSIS — I5032 Chronic diastolic (congestive) heart failure: Secondary | ICD-10-CM | POA: Diagnosis present

## 2018-11-26 DIAGNOSIS — I4819 Other persistent atrial fibrillation: Secondary | ICD-10-CM | POA: Diagnosis not present

## 2018-11-26 DIAGNOSIS — Z79899 Other long term (current) drug therapy: Secondary | ICD-10-CM

## 2018-11-26 DIAGNOSIS — W19XXXA Unspecified fall, initial encounter: Secondary | ICD-10-CM | POA: Diagnosis present

## 2018-11-26 DIAGNOSIS — I495 Sick sinus syndrome: Secondary | ICD-10-CM | POA: Diagnosis present

## 2018-11-26 DIAGNOSIS — F0390 Unspecified dementia without behavioral disturbance: Secondary | ICD-10-CM | POA: Diagnosis not present

## 2018-11-26 DIAGNOSIS — F324 Major depressive disorder, single episode, in partial remission: Secondary | ICD-10-CM | POA: Diagnosis present

## 2018-11-26 DIAGNOSIS — I701 Atherosclerosis of renal artery: Secondary | ICD-10-CM | POA: Diagnosis not present

## 2018-11-26 DIAGNOSIS — M25552 Pain in left hip: Secondary | ICD-10-CM

## 2018-11-26 DIAGNOSIS — S3991XA Unspecified injury of abdomen, initial encounter: Secondary | ICD-10-CM | POA: Diagnosis not present

## 2018-11-26 DIAGNOSIS — S32029A Unspecified fracture of second lumbar vertebra, initial encounter for closed fracture: Secondary | ICD-10-CM | POA: Diagnosis not present

## 2018-11-26 DIAGNOSIS — Z90710 Acquired absence of both cervix and uterus: Secondary | ICD-10-CM

## 2018-11-26 DIAGNOSIS — B961 Klebsiella pneumoniae [K. pneumoniae] as the cause of diseases classified elsewhere: Secondary | ICD-10-CM | POA: Diagnosis present

## 2018-11-26 DIAGNOSIS — Z8673 Personal history of transient ischemic attack (TIA), and cerebral infarction without residual deficits: Secondary | ICD-10-CM

## 2018-11-26 DIAGNOSIS — F419 Anxiety disorder, unspecified: Secondary | ICD-10-CM | POA: Diagnosis present

## 2018-11-26 DIAGNOSIS — R410 Disorientation, unspecified: Secondary | ICD-10-CM

## 2018-11-26 DIAGNOSIS — M1612 Unilateral primary osteoarthritis, left hip: Secondary | ICD-10-CM | POA: Diagnosis not present

## 2018-11-26 DIAGNOSIS — S32020A Wedge compression fracture of second lumbar vertebra, initial encounter for closed fracture: Secondary | ICD-10-CM | POA: Diagnosis present

## 2018-11-26 DIAGNOSIS — N390 Urinary tract infection, site not specified: Secondary | ICD-10-CM | POA: Diagnosis not present

## 2018-11-26 DIAGNOSIS — R52 Pain, unspecified: Secondary | ICD-10-CM | POA: Diagnosis not present

## 2018-11-26 DIAGNOSIS — M810 Age-related osteoporosis without current pathological fracture: Secondary | ICD-10-CM | POA: Diagnosis present

## 2018-11-26 DIAGNOSIS — I10 Essential (primary) hypertension: Secondary | ICD-10-CM | POA: Diagnosis not present

## 2018-11-26 DIAGNOSIS — S32000A Wedge compression fracture of unspecified lumbar vertebra, initial encounter for closed fracture: Secondary | ICD-10-CM

## 2018-11-26 DIAGNOSIS — I110 Hypertensive heart disease with heart failure: Secondary | ICD-10-CM | POA: Diagnosis present

## 2018-11-26 DIAGNOSIS — I2510 Atherosclerotic heart disease of native coronary artery without angina pectoris: Secondary | ICD-10-CM | POA: Diagnosis present

## 2018-11-26 DIAGNOSIS — F329 Major depressive disorder, single episode, unspecified: Secondary | ICD-10-CM | POA: Diagnosis not present

## 2018-11-26 DIAGNOSIS — E876 Hypokalemia: Secondary | ICD-10-CM | POA: Diagnosis present

## 2018-11-26 DIAGNOSIS — R296 Repeated falls: Secondary | ICD-10-CM | POA: Diagnosis present

## 2018-11-26 DIAGNOSIS — Z96652 Presence of left artificial knee joint: Secondary | ICD-10-CM | POA: Diagnosis present

## 2018-11-26 DIAGNOSIS — H3530 Unspecified macular degeneration: Secondary | ICD-10-CM | POA: Diagnosis present

## 2018-11-26 DIAGNOSIS — M545 Low back pain: Secondary | ICD-10-CM | POA: Diagnosis not present

## 2018-11-26 DIAGNOSIS — W1830XA Fall on same level, unspecified, initial encounter: Secondary | ICD-10-CM | POA: Diagnosis present

## 2018-11-26 DIAGNOSIS — M5136 Other intervertebral disc degeneration, lumbar region: Secondary | ICD-10-CM | POA: Diagnosis present

## 2018-11-26 DIAGNOSIS — Z7901 Long term (current) use of anticoagulants: Secondary | ICD-10-CM

## 2018-11-26 DIAGNOSIS — Z9049 Acquired absence of other specified parts of digestive tract: Secondary | ICD-10-CM

## 2018-11-26 DIAGNOSIS — Y92099 Unspecified place in other non-institutional residence as the place of occurrence of the external cause: Secondary | ICD-10-CM

## 2018-11-26 LAB — COMPREHENSIVE METABOLIC PANEL
ALBUMIN: 3.3 g/dL — AB (ref 3.5–5.0)
ALT: 21 U/L (ref 0–44)
ANION GAP: 15 (ref 5–15)
AST: 27 U/L (ref 15–41)
Alkaline Phosphatase: 72 U/L (ref 38–126)
BUN: 24 mg/dL — ABNORMAL HIGH (ref 8–23)
CO2: 21 mmol/L — AB (ref 22–32)
Calcium: 9.1 mg/dL (ref 8.9–10.3)
Chloride: 100 mmol/L (ref 98–111)
Creatinine, Ser: 1.5 mg/dL — ABNORMAL HIGH (ref 0.44–1.00)
GFR calc non Af Amer: 31 mL/min — ABNORMAL LOW (ref >60)
GFR, EST AFRICAN AMERICAN: 35 mL/min — AB (ref >60)
GLUCOSE: 137 mg/dL — AB (ref 70–99)
POTASSIUM: 3.3 mmol/L — AB (ref 3.5–5.1)
SODIUM: 136 mmol/L (ref 135–145)
TOTAL PROTEIN: 7.1 g/dL (ref 6.5–8.1)
Total Bilirubin: 0.9 mg/dL (ref 0.3–1.2)

## 2018-11-26 LAB — CBC WITH DIFFERENTIAL/PLATELET
Abs Immature Granulocytes: 0.04 10*3/uL (ref 0.00–0.07)
BASOS ABS: 0.1 10*3/uL (ref 0.0–0.1)
Basophils Relative: 1 %
EOS ABS: 0.3 10*3/uL (ref 0.0–0.5)
EOS PCT: 3 %
HEMATOCRIT: 32.6 % — AB (ref 36.0–46.0)
Hemoglobin: 10.5 g/dL — ABNORMAL LOW (ref 12.0–15.0)
IMMATURE GRANULOCYTES: 0 %
LYMPHS PCT: 9 %
Lymphs Abs: 0.8 10*3/uL (ref 0.7–4.0)
MCH: 30.9 pg (ref 26.0–34.0)
MCHC: 32.2 g/dL (ref 30.0–36.0)
MCV: 95.9 fL (ref 80.0–100.0)
Monocytes Absolute: 0.9 10*3/uL (ref 0.1–1.0)
Monocytes Relative: 11 %
NEUTROS PCT: 76 %
NRBC: 0 % (ref 0.0–0.2)
Neutro Abs: 6.9 10*3/uL (ref 1.7–7.7)
Platelets: 323 10*3/uL (ref 150–400)
RBC: 3.4 MIL/uL — AB (ref 3.87–5.11)
RDW: 13.1 % (ref 11.5–15.5)
WBC: 9 10*3/uL (ref 4.0–10.5)

## 2018-11-26 LAB — POCT URINALYSIS DIPSTICK
Bilirubin, UA: NEGATIVE
Glucose, UA: NEGATIVE
Ketones, UA: NEGATIVE
LEUKOCYTES UA: NEGATIVE
NITRITE UA: NEGATIVE
Odor: NORMAL
PH UA: 6.5 (ref 5.0–8.0)
PROTEIN UA: NEGATIVE
RBC UA: NEGATIVE
SPEC GRAV UA: 1.01 (ref 1.010–1.025)
UROBILINOGEN UA: 0.2 U/dL

## 2018-11-26 LAB — LIPASE, BLOOD: Lipase: 37 U/L (ref 11–51)

## 2018-11-26 LAB — URINALYSIS, ROUTINE W REFLEX MICROSCOPIC
Bilirubin Urine: NEGATIVE
Glucose, UA: NEGATIVE mg/dL
Ketones, ur: NEGATIVE mg/dL
LEUKOCYTES UA: NEGATIVE
Nitrite: NEGATIVE
Protein, ur: NEGATIVE mg/dL
Specific Gravity, Urine: 1.01 (ref 1.005–1.030)
pH: 7 (ref 5.0–8.0)

## 2018-11-26 LAB — URINALYSIS, MICROSCOPIC (REFLEX): WBC, UA: NONE SEEN WBC/hpf (ref 0–5)

## 2018-11-26 LAB — I-STAT CG4 LACTIC ACID, ED: LACTIC ACID, VENOUS: 0.79 mmol/L (ref 0.5–1.9)

## 2018-11-26 MED ORDER — TRAMADOL HCL 50 MG PO TABS: 25.0000 mg | ORAL_TABLET | Freq: Four times a day (QID) | ORAL | 0 refills | Status: DC | PRN

## 2018-11-26 MED ORDER — SODIUM CHLORIDE 0.9 % IV BOLUS
500.0000 mL | Freq: Once | INTRAVENOUS | Status: AC
  Administered 2018-11-26: 500 mL via INTRAVENOUS

## 2018-11-26 MED ORDER — IOHEXOL 300 MG/ML  SOLN
75.0000 mL | Freq: Once | INTRAMUSCULAR | Status: AC | PRN
  Administered 2018-11-26: 75 mL via INTRAVENOUS

## 2018-11-26 MED ORDER — MORPHINE SULFATE (PF) 4 MG/ML IV SOLN
4.0000 mg | Freq: Once | INTRAVENOUS | Status: AC
  Administered 2018-11-26: 4 mg via INTRAVENOUS
  Filled 2018-11-26: qty 1

## 2018-11-26 NOTE — ED Provider Notes (Signed)
Emergency Department Provider Note   I have reviewed the triage vital signs and the nursing notes.   HISTORY  Chief Complaint No chief complaint on file.   HPI Kasidy Gianino is a 84 y.o. female with PMH of CAD, CHF, Dementia, A-fib on Eliquis, and prior TIA presents to the emergency department for evaluation of severe and worsening right hip/abdominal pain.  Family state that patient has been more confused over the past 2 to 3 days and they are concerned she could have a urinary tract infection.  They went to the primary care physician who ran blood work and a x-ray of the right hip with no acute findings.  Patient has difficulty describing exactly when the pain began and what she is experiencing specifically. Patient denies CP or SOB.   Level 5 caveat: Dementia.   Past Medical History:  Diagnosis Date  . Abnormal weight loss 05/28/2004  . Anxiety 01/17/2003  . Back pain 08/19/2005   with radiculopathy  . Bradycardia, drug induced    Diltiazem  . Cerebral atherosclerosis 09/24/1999  . Chest pain, atypical 04/2016   minor CAD at cath   . CHF (congestive heart failure) ([place])   . Chronic anticoagulation    Eliquis  . Dementia ([place])    "don't know kind or stage" (05/02/2016)  . Depression 09/06/2002  . Diverticulitis 09/28/2007  . External hemorrhoids 04/12/2009  . Female climacteric state 03/04/2000  . 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 GERD (gastroesophageal reflux disease)   . Hypertensive cardiovascular disease    moderate LVH on echo march 2017  . Macular degeneration   . Malaise and fatigue   . 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 Osteoarthrosis, localized   . Osteoporosis 05/28/2004  . Paranoia ([place])   . Paroxysmal A-fib ([place])   . TIA (transient ischemic attack) 2000s?  [name] Urinary incontinence 08/18/2007  . UTI (urinary tract infection)   . Vaginitis, atrophic   . Vertigo, peripheral  10/07/2000    Patient Active Problem List   Diagnosis Date Noted  . Fall 11/27/2018  . Closed compression fracture of L2 lumbar vertebra, initial encounter ([place]) 11/27/2018  . Paroxysmal atrial fibrillation ([place]) 11/20/2018  . Major depressive disorder with single episode, in partial remission ([place]) 10/01/2018  . Atherosclerosis of renal artery ([place]) 10/01/2018  . Acute on chronic diastolic CHF (congestive heart failure) ([place]) 12/13/2017  . External hemorrhoids 10/20/2017  . Diverticulosis of large intestine without hemorrhage 10/20/2017  . Iron deficiency anemia secondary to blood loss (chronic) 10/20/2017  . Anxiety and depression 10/20/2017  . Chronic diastolic heart failure ([place]) 07/29/2017  . Expressive aphasia   . Near syncope 07/09/2017  . Hypertensive cardiovascular disease   . Long term current use of anticoagulant   . Polypharmacy 05/03/2017  . DDD (degenerative disc disease), lumbar 03/24/2017  . Left hip pain 03/24/2017  . Current use of long term anticoagulation 06/16/2016  . Chest pain 05/02/2016  . GERD (gastroesophageal reflux disease) 04/18/2016  . Depression 04/18/2016  . SSS (sick sinus syndrome) ([place]) 04/11/2016  . Chest pain, atypical 04/08/2016  . Aortic insufficiency 01/14/2016  . PAH (pulmonary artery hypertension) ([place]) 01/14/2016  . Essential hypertension 01/14/2016  . Persistent atrial fibrillation 12/27/2015  . Dementia ([place]) 12/27/2015  . Hypokalemia 12/27/2015  . Demand ischemia [facility])     Past Surgical History:  Procedure Laterality Date  . BRAIN SURGERY  2000  Neurocysticercosis ("parasite")  . CARDIAC CATHETERIZATION N/A 05/03/2016   Procedure: Left Heart Cath and Coronary Angiography;  Surgeon: Sherren Mocha, MD;  Location: [place] CV LAB;  Service: Cardiovascular;  Laterality: N/A;  . CARDIOVERSION N/A 07/14/2017   Procedure: CARDIOVERSION;  Surgeon: Pixie Casino, MD;  Location: [facility] ENDOSCOPY;  Service: Cardiovascular;  Laterality:  N/A;  . CATARACT EXTRACTION  2013  . CATARACT EXTRACTION, BILATERAL     "not sure if both eyes; feel like it probably was"  . JOINT REPLACEMENT    . PERIPHERAL VASCULAR CATHETERIZATION N/A 05/03/2016   Procedure: Abdominal Aortogram;  Surgeon: Sherren Mocha, MD;  Location: [place] CV LAB;  Service: Cardiovascular;  Laterality: N/A;  . SHOULDER ARTHROSCOPY W/ ROTATOR CUFF REPAIR Right X 3  . TEE WITHOUT CARDIOVERSION N/A 07/14/2017   Procedure: TRANSESOPHAGEAL ECHOCARDIOGRAM (TEE);  Surgeon: Pixie Casino, MD;  Location: [place];  Service: Cardiovascular;  Laterality: N/A;  . TOTAL KNEE ARTHROPLASTY Left 1990  . VAGINAL HYSTERECTOMY  1960    Allergies Tape; Bactrim [sulfamethoxazole-trimethoprim]; Amiodarone; Amoxicillin; Demerol [meperidine]; Hydralazine; Lisinopril; and Zoloft [sertraline hcl]  Family History  Problem Relation Age of Onset  . Polycystic kidney disease Mother   . Heart attack Father   . Hypertension Sister   . Dementia Neg Hx     Social History Social History   Tobacco Use  . Smoking status: Never Smoker  . Smokeless tobacco: Never Used  Substance Use Topics  . Alcohol use: No    Alcohol/week: 0.0 standard drinks  . Drug use: No    Review of Systems  Constitutional: No fever/chills Eyes: No visual changes. ENT: No sore throat. Cardiovascular: Denies chest pain. Respiratory: Denies shortness of breath. Gastrointestinal: Positive abdominal pain.  No nausea, no vomiting.  No diarrhea.  No constipation. Genitourinary: Negative for dysuria. Musculoskeletal: Positive back and right hip pain.  Skin: Negative for rash. Neurological: Negative for headaches, focal weakness or numbness.  10-point ROS otherwise negative.  ____________________________________________   PHYSICAL EXAM:  VITAL SIGNS: ED Triage Vitals  Enc Vitals Group     BP 11/26/18 2000 (!) 164/55     Pulse Rate 11/26/18 2000 61     Resp --      Temp 11/26/18 1957 98.4 F  (36.9 C)     Temp Source 11/26/18 1957 Oral     SpO2 11/26/18 2000 99 %   Constitutional: Alert but confused. Well appearing and in no acute distress. Eyes: Conjunctivae are normal.  Head: Atraumatic. Nose: No congestion/rhinnorhea. Mouth/Throat: Mucous membranes are moist.  Neck: No stridor.   Cardiovascular: Normal rate, regular rhythm. Good peripheral circulation. Grossly normal heart sounds.   Respiratory: Normal respiratory effort.  No retractions. Lungs CTAB. Gastrointestinal: Soft and nontender. No distention.  Musculoskeletal: Normal ROM of the right hip, knee, and ankle without pain. Point tenderness over the lumbar spine.  Neurologic:  Normal speech and language. No gross focal neurologic deficits are appreciated.  Skin:  Skin is warm, dry and intact. No rash noted.  ____________________________________________   LABS (all labs ordered are listed, but only abnormal results are displayed)  Labs Reviewed  COMPREHENSIVE METABOLIC PANEL - Abnormal; Notable for the following components:      Result Value   Potassium 3.3 (*)    CO2 21 (*)    Glucose, Bld 137 (*)    BUN 24 (*)    Creatinine, Ser 1.50 (*)    Albumin 3.3 (*)    GFR calc non Af Amer 31 (*)  GFR calc Af Amer 35 (*)    All other components within normal limits  CBC WITH DIFFERENTIAL/PLATELET - Abnormal; Notable for the following components:   RBC 3.40 (*)    Hemoglobin 10.5 (*)    HCT 32.6 (*)    All other components within normal limits  URINALYSIS, ROUTINE W REFLEX MICROSCOPIC - Abnormal; Notable for the following components:   Hgb urine dipstick MODERATE (*)    All other components within normal limits  URINALYSIS, MICROSCOPIC (REFLEX) - Abnormal; Notable for the following components:   Bacteria, UA RARE (*)    All other components within normal limits  URINE CULTURE  LIPASE, BLOOD  I-STAT CG4 LACTIC ACID, ED  I-STAT CG4 LACTIC ACID, ED    ____________________________________________  RADIOLOGY  Ct Abdomen Pelvis W Contrast  Result Date: 11/26/2018 CLINICAL DATA:  Fall.  Confusion. EXAM: CT ABDOMEN AND PELVIS WITH CONTRAST TECHNIQUE: Multidetector CT imaging of the abdomen and pelvis was performed using the standard protocol following bolus administration of intravenous contrast. CONTRAST:  87mL OMNIPAQUE IOHEXOL 300 MG/ML  SOLN COMPARISON:  Lumbar spine x-rays dated January 21, 2017. FINDINGS: Lower chest: Cardiomegaly. Bibasilar subsegmental atelectasis and mild interlobular septal thickening. Trace left pleural effusion. Hepatobiliary: Tiny calcified granulomas in the right hepatic lobe. Status post cholecystectomy. Mild intra and extrahepatic biliary dilatation, likely related to post cholecystectomy state. Pancreas: Mild main pancreatic duct dilatation, measuring 4 mm. No mass or surrounding inflammatory changes. Spleen: Normal in size without focal abnormality. Adrenals/Urinary Tract: The right adrenal gland is unremarkable. Mild left adrenal thickening without focal nodule. Moderately atrophic right kidney delayed enhancement and excretion of contrast. Subcentimeter low-density lesions in both kidneys are too small to characterize. No renal or ureteral calculi. No hydronephrosis. Bladder is unremarkable. Stomach/Bowel: Small hiatal hernia. The stomach is otherwise within normal limits. No bowel wall thickening, distention, or surrounding inflammatory changes. Extensive distal descending and sigmoid colonic diverticulosis. The appendix is not identified, but there are no signs of inflammation at the base of the cecum. Vascular/Lymphatic: Aortoiliac atherosclerotic vascular disease. No aneurysm. Severe stenosis of the right renal artery origin. No enlarged abdominal or pelvic lymph nodes. Reproductive: Status post hysterectomy. No adnexal masses. Other: No abdominal wall hernia or abnormality. No abdominopelvic ascites. No  pneumoperitoneum. Musculoskeletal: Age-indeterminate mild L2 superior endplate compression deformity, new since February 2018. Moderate to severe lumbar spondylosis. Moderate to severe right and mild left hip osteoarthritis. IMPRESSION: 1.  No acute intra-abdominal process. 2. Age-indeterminate mild L2 superior endplate compression deformity, new since February 2018. Correlate with point tenderness. 3. Mild interstitial pulmonary edema.  Trace left pleural effusion. 4. Moderate right renal atrophy likely related to longstanding hypoperfusion given severe right renal artery origin stenosis. 5.  Aortic atherosclerosis (ICD10-I70.0). Electronically Signed   By: Titus Dubin M.D.   On: 11/26/2018 23:29   Dg Hip Unilat With Pelvis 2-3 Views Left  Result Date: 11/26/2018 CLINICAL DATA:  Left hip pain. EXAM: DG HIP (WITH OR WITHOUT PELVIS) 2-3V LEFT COMPARISON:  No prior. FINDINGS: Pelvic calcifications consistent phleboliths. Calcifications noted the buttock consistent with injection granulomas. No acute bony abnormality identified. No evidence of fracture or dislocation. Peripheral vascular calcification. IMPRESSION: 1.  Degenerative changes left hip. 2.  No acute bony or joint abnormality. 3.  Peripheral vascular disease. Electronically Signed   By: Marcello Moores  Register   On: 11/26/2018 17:22    ____________________________________________   PROCEDURES  Procedure(s) performed:   Procedures  None ____________________________________________   INITIAL IMPRESSION / ASSESSMENT AND PLAN /  ED COURSE  Pertinent labs & imaging results that were available during my care of the patient were reviewed by me and considered in my medical decision making (see chart for details).  Patient presents to the ED with pain in the lower abdomen/back and left hip. Plain films and labs from PCP today reviewed with no acute fracture. Labs here are unremarkable. No UTI. CT imaging reviewed with Lumbar compression fracture  noted. New since prior CT. Correlates with area of pain on exam. Patient requiring multiple morphine doses for pain control and continues to have severe pain with movement. Plan for admit for pain control and PT/OT evaluation. No neuro findings to suggest cord compression or cauda equina.   Discussed patient's case with Hospitalist, Dr. Jonelle Sidle to request admission. Patient and family (if present) updated with plan. Care transferred to Hospitalist service.  I reviewed all nursing notes, vitals, pertinent old records, EKGs, labs, imaging (as available).   ____________________________________________  FINAL CLINICAL IMPRESSION(S) / ED DIAGNOSES  Final diagnoses:  Compression fracture of lumbar vertebra, initial encounter, unspecified lumbar vertebral level ([place])  Fall, initial encounter     MEDICATIONS GIVEN DURING THIS VISIT:  Medications  morphine 4 MG/ML injection 4 mg (4 mg Intravenous Given 11/26/18 2049)  sodium chloride 0.9 % bolus 500 mL (0 mLs Intravenous Stopped 11/26/18 2222)  morphine 4 MG/ML injection 4 mg (4 mg Intravenous Given 11/26/18 2154)  iohexol (OMNIPAQUE) 300 MG/ML solution 75 mL (75 mLs Intravenous Contrast Given 11/26/18 2248)    Note:  This document was prepared using Dragon voice recognition software and may include unintentional dictation errors.  Nanda Quinton, MD Emergency Medicine    Long, Wonda Olds, MD 11/27/18 [phone]

## 2018-11-26 NOTE — Telephone Encounter (Signed)
Patient daughter called requesting an appointment. Patient has a UTI and she wants to bring her in before it worsens.  Appointment scheduled for today with Janett Billow. Daughter agreed.

## 2018-11-26 NOTE — Progress Notes (Signed)
Careteam: Patient Care Team: Gayland Curry, DO as PCP - General (Geriatric Medicine) Sanda Klein, MD as PCP - Cardiology (Cardiology) Sherlynn Stalls, MD as Consulting Physician (Ophthalmology) Murriel Hopper, MD as Referring Physician (Student)  Advanced Directive information    Allergies  Allergen Reactions  . Bactrim [Sulfamethoxazole-Trimethoprim] Nausea Only  . Amiodarone Itching  . Amoxicillin Other (See Comments)    Unknown- ? Upset stomach  . Demerol [Meperidine] Other (See Comments)    Hallucinations  . Hydralazine Other (See Comments)    Tingling and chest pain   . Lisinopril Cough  . Zoloft [Sertraline Hcl] Other (See Comments)    Unknown  . Tape Rash and Other (See Comments)    Can tear the skin, also    Chief Complaint  Patient presents with  . Acute Visit    Possible UTI, mental confusion. Patient here with daughter [name]   . Fall    Patient fell this am, not sure how. Patient with left leg/groin concerns - has a catch off/on      HPI: Patient is a 84 y.o. female seen in the office today due to acute confusion. Pt with dementia and lives alone. Has med reminder person come in twice daily to help her with medication and found her on the floor this morning.  Daughter providing history and states she was at baseline 4 days ago then 3 days ago slept all day and since has been more confused.  Daughter feels like this is the presentation when she has a UTI.  There has been no change in routine or medication.no fever or chill.  No cough, congestion. No dysuria noted or diarrhea.  Pt has no recall of fall this morning. Having a hard time walking due to pain in her left groin.   Review of Systems:  Review of Systems  Unable to perform ROS: Dementia    Past Medical History:  Diagnosis Date  . Abnormal weight loss 05/28/2004  . Anxiety 01/17/2003  . Back pain 08/19/2005   with radiculopathy  . Bradycardia, drug induced    Diltiazem  . Cerebral  atherosclerosis 09/24/1999  . Chest pain, atypical 04/2016   minor CAD at cath   . CHF (congestive heart failure) ([place])   . Chronic anticoagulation    Eliquis  . Dementia ([place])    "don't know kind or stage" (05/02/2016)  . Depression 09/06/2002  . Diverticulitis 09/28/2007  . External hemorrhoids 04/12/2009  . Female climacteric state 03/04/2000  . 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 GERD (gastroesophageal reflux disease)   . Hypertensive cardiovascular disease    moderate LVH on echo march 2017  . Macular degeneration   . Malaise and fatigue   . Neurocysticercosis    Craniotomy at [facility] in early 2000s  . Osteoarthrosis, localized   . Osteoporosis 05/28/2004  . Paranoia ([place])   . Paroxysmal A-fib ([place])   . TIA (transient ischemic attack) 2000s?  [name] Urinary incontinence 08/18/2007  . UTI (urinary tract infection)   . Vaginitis, atrophic   . Vertigo, peripheral 10/07/2000   Past Surgical History:  Procedure Laterality Date  . BRAIN SURGERY  2000   Neurocysticercosis ("parasite")  . CARDIAC CATHETERIZATION N/A 05/03/2016   Procedure: Left Heart Cath and Coronary Angiography;  Surgeon: Sherren Mocha, MD;  Location: [place] CV LAB;  Service: Cardiovascular;  Laterality: N/A;  . CARDIOVERSION N/A 07/14/2017   Procedure: CARDIOVERSION;  Surgeon: Debara Pickett,  Nadean Corwin, MD;  Location: [facility] ENDOSCOPY;  Service: Cardiovascular;  Laterality: N/A;  . CATARACT EXTRACTION  2013  . CATARACT EXTRACTION, BILATERAL     "not sure if both eyes; feel like it probably was"  . JOINT REPLACEMENT    . PERIPHERAL VASCULAR CATHETERIZATION N/A 05/03/2016   Procedure: Abdominal Aortogram;  Surgeon: Sherren Mocha, MD;  Location: [place] CV LAB;  Service: Cardiovascular;  Laterality: N/A;  . SHOULDER ARTHROSCOPY W/ ROTATOR CUFF REPAIR Right X 3  . TEE WITHOUT CARDIOVERSION N/A 07/14/2017   Procedure: TRANSESOPHAGEAL ECHOCARDIOGRAM (TEE);  Surgeon: Pixie Casino, MD;  Location: [place];  Service: Cardiovascular;  Laterality: N/A;  . TOTAL KNEE ARTHROPLASTY Left 1990  . VAGINAL HYSTERECTOMY  1960   Social History:   reports that she has never smoked. She has never used smokeless tobacco. She reports that she does not drink alcohol or use drugs.  Family History  Problem Relation Age of Onset  . Polycystic kidney disease Mother   . Heart attack Father   . Hypertension Sister   . Dementia Neg Hx     Medications: Patient's Medications  New Prescriptions   No medications on file  Previous Medications   ACETAMINOPHEN (TYLENOL) 325 MG TABLET    Take 650 mg by mouth every 6 (six) hours as needed for mild pain.   AMLODIPINE (NORVASC) 10 MG TABLET    Take 1 tablet (10 mg total) by mouth daily.   CHOLECALCIFEROL (VITAMIN D3) 2000 UNITS TABS    Take 2,000 Units by mouth daily.    DOCUSATE SODIUM (COLACE) 100 MG CAPSULE    Take 100 mg by mouth daily.    DONEPEZIL (ARICEPT) 10 MG TABLET    Take 1 tablet (10 mg total) by mouth daily.   ELIQUIS 2.5 MG TABS TABLET    TAKE 1 TABLET TWICE DAILY   FUROSEMIDE (LASIX) 40 MG TABLET    Take 1-2 tablets (40-80 mg total) by mouth daily as directed.   LOSARTAN (COZAAR) 25 MG TABLET    TAKE 1 TABLET EVERY DAY   METOPROLOL TARTRATE (LOPRESSOR) 50 MG TABLET    Take 1 tablet (50 mg total) by mouth 2 (two) times daily.   UNABLE TO FIND    Lift Chair due to proximal muscle weakness, frailty   VENLAFAXINE (EFFEXOR) 75 MG TABLET    Take 1 tablet (75 mg total) 2 (two) times daily by mouth.  Modified Medications   No medications on file  Discontinued Medications   BETAMETHASONE DIPROPIONATE (DIPROLENE) 0.05 % OINTMENT    Apply topically 2 (two) times daily.     Physical Exam:  Vitals:   11/26/18 1054  BP: 118/60  Pulse: 60  Temp: (!) 97.5 F (36.4 C)  TempSrc: Oral  SpO2: 97%  Weight: 131 lb (59.4 kg)  Height: 5\' 1"  (1.549 m)   Body mass index is 24.75 kg/m.  Physical Exam Vitals signs  reviewed.  Constitutional:      Appearance: She is well-developed.  Cardiovascular:     Rate and Rhythm: Rhythm irregularly irregular.     Pulses: Normal pulses.  Pulmonary:     Effort: Pulmonary effort is normal.     Breath sounds: Normal breath sounds.  Musculoskeletal:     Left hip: She exhibits decreased range of motion, decreased strength and tenderness.     Right knee: She exhibits laceration (2 superficial).  Skin:    General: Skin is warm and dry.  Neurological:  Mental Status: She is alert. She is disoriented and confused.     Motor: Weakness present.     Gait: Gait is intact.     Comments: Pt with hx of dementia with baseline confusion.   Psychiatric:        Behavior: Behavior normal.        Thought Content: Thought content normal.        Judgment: Judgment normal.     Labs reviewed: Basic Metabolic Panel: Recent Labs    12/13/17 1840 12/14/17 0732 12/15/17 0226 12/16/17 0633 12/22/17 1059  NA  --  129* 133* 132* 135  K  --  4.4 4.2 4.2 3.7  CL  --  97* 100* 99* 99  CO2  --  21* 23 24 27   GLUCOSE  --  83 68 91 115  BUN  --  19 21* 18 22  CREATININE  --  1.28* 1.62* 1.54* 1.50*  CALCIUM  --  8.7* 8.1* 8.6* 9.2  MG  --  1.8  --   --   --   TSH 8.725*  --   --   --   --    Liver Function Tests: Recent Labs    12/03/17 1016  AST 28  ALT 22  BILITOT 1.1  PROT 7.1   Recent Labs    12/03/17 1016  LIPASE 39  AMYLASE 36   No results for input(s): AMMONIA in the last 8760 hours. CBC: Recent Labs    12/03/17 1016  12/15/17 0226 12/16/17 0633 02/26/18 1042  WBC 6.1   < > 6.4 6.3 5.4  NEUTROABS 4,709  --   --   --  3,883  HGB 12.9   < > 10.3* 11.6* 11.3*  HCT 38.2   < > 33.0* 37.4 34.2*  MCV 87.6   < > 91.2 91.4 89.8  PLT 267   < > 221 255 287   < > = values in this interval not displayed.   Lipid Panel: No results for input(s): CHOL, HDL, LDLCALC, TRIG, CHOLHDL, LDLDIRECT in the last 8760 hours. TSH: Recent Labs    12/13/17 1840    TSH 8.725*   A1C: Lab Results  Component Value Date   HGBA1C 6.2 (H) 05/02/2016     Assessment/Plan 1. Mental confusion -pt with baseline dementia but having increased confusion per daughter, pt unable to contribute to HPI/ROS and PE is limited due to confusion  - POC Urinalysis Dipstick- normal however will send for culture.  - Culture, Urine - BASIC METABOLIC PANEL WITH GFR - CBC with Differential/Platelets - TSH -if symptoms worsen to go to ED immediately   2. Left hip pain Concern for fracture, will get xray to rule out due to pain -to use tylenol 500 mg 1-2 tablets every 8 hours as needed pain and have 24/7 supervision at this time. - DG HIP UNILAT WITH PELVIS 2-3 VIEWS LEFT; Future  Next appt: 11/30/2018 Carlos American. [place], [facility] [phone]

## 2018-11-26 NOTE — ED Triage Notes (Signed)
Pt. From [facility] via EMS with an unwitnessed fall that happened this morning. Pt. Went to primary care and was scanned due to left hip and foot pain and had urine taken due to a concern of UTI. Pt. Has not received results and is having increased pain. A/O X4   Pt is on blood thinner.

## 2018-11-26 NOTE — Patient Instructions (Addendum)
To increase hydration.  To have someone stay with Brittney Gutierrez at this time.  To get xray of left hip and pelvis due to fall.  To use tylenol 500 mg 1-2 tablets every 8 hours as needed for pain  Will get blood work and send urine off to evaluate for infection  If symptoms worsen to go to the emergency department

## 2018-11-26 NOTE — Telephone Encounter (Signed)
Patient daughter, Alveda Reasons called and stated that patient was seen this morning by Janett Billow for Left leg pain and it is getting worse. Tylenol is not helping. Requesting something to help with the pain. Patient screaming out in pain.  Please Advise.

## 2018-11-26 NOTE — Telephone Encounter (Signed)
Will send in some tramadol to pharmacy, can take tramadol 25-50 mg by mouth every 6 hours as needed for pain. Okay to take this with tylenol if needed

## 2018-11-27 ENCOUNTER — Inpatient Hospital Stay (HOSPITAL_COMMUNITY): Payer: Medicare Other

## 2018-11-27 ENCOUNTER — Other Ambulatory Visit: Payer: Self-pay

## 2018-11-27 DIAGNOSIS — M545 Low back pain: Secondary | ICD-10-CM | POA: Diagnosis not present

## 2018-11-27 DIAGNOSIS — S32020A Wedge compression fracture of second lumbar vertebra, initial encounter for closed fracture: Secondary | ICD-10-CM | POA: Diagnosis not present

## 2018-11-27 DIAGNOSIS — M5126 Other intervertebral disc displacement, lumbar region: Secondary | ICD-10-CM | POA: Diagnosis not present

## 2018-11-27 DIAGNOSIS — M810 Age-related osteoporosis without current pathological fracture: Secondary | ICD-10-CM | POA: Diagnosis present

## 2018-11-27 DIAGNOSIS — I2510 Atherosclerotic heart disease of native coronary artery without angina pectoris: Secondary | ICD-10-CM | POA: Diagnosis present

## 2018-11-27 DIAGNOSIS — I110 Hypertensive heart disease with heart failure: Secondary | ICD-10-CM | POA: Diagnosis present

## 2018-11-27 DIAGNOSIS — F419 Anxiety disorder, unspecified: Secondary | ICD-10-CM | POA: Diagnosis present

## 2018-11-27 DIAGNOSIS — Z9049 Acquired absence of other specified parts of digestive tract: Secondary | ICD-10-CM | POA: Diagnosis not present

## 2018-11-27 DIAGNOSIS — W1830XA Fall on same level, unspecified, initial encounter: Secondary | ICD-10-CM | POA: Diagnosis present

## 2018-11-27 DIAGNOSIS — W19XXXA Unspecified fall, initial encounter: Secondary | ICD-10-CM | POA: Diagnosis present

## 2018-11-27 DIAGNOSIS — M5136 Other intervertebral disc degeneration, lumbar region: Secondary | ICD-10-CM | POA: Diagnosis present

## 2018-11-27 DIAGNOSIS — H3530 Unspecified macular degeneration: Secondary | ICD-10-CM | POA: Diagnosis present

## 2018-11-27 DIAGNOSIS — K219 Gastro-esophageal reflux disease without esophagitis: Secondary | ICD-10-CM | POA: Diagnosis present

## 2018-11-27 DIAGNOSIS — I495 Sick sinus syndrome: Secondary | ICD-10-CM | POA: Diagnosis present

## 2018-11-27 DIAGNOSIS — N390 Urinary tract infection, site not specified: Secondary | ICD-10-CM | POA: Diagnosis present

## 2018-11-27 DIAGNOSIS — M47816 Spondylosis without myelopathy or radiculopathy, lumbar region: Secondary | ICD-10-CM | POA: Diagnosis not present

## 2018-11-27 DIAGNOSIS — F0390 Unspecified dementia without behavioral disturbance: Secondary | ICD-10-CM | POA: Diagnosis present

## 2018-11-27 DIAGNOSIS — I5032 Chronic diastolic (congestive) heart failure: Secondary | ICD-10-CM | POA: Diagnosis present

## 2018-11-27 DIAGNOSIS — M25552 Pain in left hip: Secondary | ICD-10-CM | POA: Diagnosis not present

## 2018-11-27 DIAGNOSIS — F0150 Vascular dementia without behavioral disturbance: Secondary | ICD-10-CM | POA: Diagnosis not present

## 2018-11-27 DIAGNOSIS — Z7901 Long term (current) use of anticoagulants: Secondary | ICD-10-CM | POA: Diagnosis not present

## 2018-11-27 DIAGNOSIS — B961 Klebsiella pneumoniae [K. pneumoniae] as the cause of diseases classified elsewhere: Secondary | ICD-10-CM | POA: Diagnosis present

## 2018-11-27 DIAGNOSIS — M4807 Spinal stenosis, lumbosacral region: Secondary | ICD-10-CM | POA: Diagnosis not present

## 2018-11-27 DIAGNOSIS — E876 Hypokalemia: Secondary | ICD-10-CM | POA: Diagnosis present

## 2018-11-27 DIAGNOSIS — I10 Essential (primary) hypertension: Secondary | ICD-10-CM | POA: Diagnosis not present

## 2018-11-27 DIAGNOSIS — Z8673 Personal history of transient ischemic attack (TIA), and cerebral infarction without residual deficits: Secondary | ICD-10-CM | POA: Diagnosis not present

## 2018-11-27 DIAGNOSIS — S32029A Unspecified fracture of second lumbar vertebra, initial encounter for closed fracture: Secondary | ICD-10-CM | POA: Diagnosis present

## 2018-11-27 DIAGNOSIS — R296 Repeated falls: Secondary | ICD-10-CM | POA: Diagnosis present

## 2018-11-27 DIAGNOSIS — Z90710 Acquired absence of both cervix and uterus: Secondary | ICD-10-CM | POA: Diagnosis not present

## 2018-11-27 DIAGNOSIS — Z79899 Other long term (current) drug therapy: Secondary | ICD-10-CM | POA: Diagnosis not present

## 2018-11-27 DIAGNOSIS — I4819 Other persistent atrial fibrillation: Secondary | ICD-10-CM | POA: Diagnosis present

## 2018-11-27 DIAGNOSIS — F329 Major depressive disorder, single episode, unspecified: Secondary | ICD-10-CM | POA: Diagnosis present

## 2018-11-27 DIAGNOSIS — Y92099 Unspecified place in other non-institutional residence as the place of occurrence of the external cause: Secondary | ICD-10-CM | POA: Diagnosis not present

## 2018-11-27 DIAGNOSIS — Z96652 Presence of left artificial knee joint: Secondary | ICD-10-CM | POA: Diagnosis present

## 2018-11-27 LAB — CBC
HCT: 30.7 % — ABNORMAL LOW (ref 36.0–46.0)
Hemoglobin: 9.8 g/dL — ABNORMAL LOW (ref 12.0–15.0)
MCH: 30.2 pg (ref 26.0–34.0)
MCHC: 31.9 g/dL (ref 30.0–36.0)
MCV: 94.5 fL (ref 80.0–100.0)
Platelets: 273 10*3/uL (ref 150–400)
RBC: 3.25 MIL/uL — ABNORMAL LOW (ref 3.87–5.11)
RDW: 13 % (ref 11.5–15.5)
WBC: 6.4 10*3/uL (ref 4.0–10.5)
nRBC: 0 % (ref 0.0–0.2)

## 2018-11-27 LAB — COMPREHENSIVE METABOLIC PANEL
ALT: 63 U/L — ABNORMAL HIGH (ref 0–44)
AST: 109 U/L — ABNORMAL HIGH (ref 15–41)
Albumin: 2.8 g/dL — ABNORMAL LOW (ref 3.5–5.0)
Alkaline Phosphatase: 121 U/L (ref 38–126)
Anion gap: 11 (ref 5–15)
BUN: 19 mg/dL (ref 8–23)
CO2: 24 mmol/L (ref 22–32)
Calcium: 8.7 mg/dL — ABNORMAL LOW (ref 8.9–10.3)
Chloride: 103 mmol/L (ref 98–111)
Creatinine, Ser: 1.49 mg/dL — ABNORMAL HIGH (ref 0.44–1.00)
GFR calc Af Amer: 36 mL/min — ABNORMAL LOW (ref >60)
GFR, EST NON AFRICAN AMERICAN: 31 mL/min — AB (ref >60)
Glucose, Bld: 96 mg/dL (ref 70–99)
Potassium: 3.2 mmol/L — ABNORMAL LOW (ref 3.5–5.1)
Sodium: 138 mmol/L (ref 135–145)
Total Bilirubin: 1.6 mg/dL — ABNORMAL HIGH (ref 0.3–1.2)
Total Protein: 6.2 g/dL — ABNORMAL LOW (ref 6.5–8.1)

## 2018-11-27 LAB — CBC WITH DIFFERENTIAL/PLATELET
Absolute Monocytes: 930 cells/uL (ref 200–950)
BASOS ABS: 58 {cells}/uL (ref 0–200)
Basophils Relative: 0.7 %
EOS PCT: 2.2 %
Eosinophils Absolute: 183 cells/uL (ref 15–500)
HCT: 32.6 % — ABNORMAL LOW (ref 35.0–45.0)
Hemoglobin: 11.1 g/dL — ABNORMAL LOW (ref 11.7–15.5)
Lymphs Abs: 631 cells/uL — ABNORMAL LOW (ref 850–3900)
MCH: 30.7 pg (ref 27.0–33.0)
MCHC: 34 g/dL (ref 32.0–36.0)
MCV: 90.1 fL (ref 80.0–100.0)
MONOS PCT: 11.2 %
MPV: 11.2 fL (ref 7.5–12.5)
Neutro Abs: 6499 cells/uL (ref 1500–7800)
Neutrophils Relative %: 78.3 %
PLATELETS: 332 10*3/uL (ref 140–400)
RBC: 3.62 10*6/uL — AB (ref 3.80–5.10)
RDW: 12.8 % (ref 11.0–15.0)
TOTAL LYMPHOCYTE: 7.6 %
WBC: 8.3 10*3/uL (ref 3.8–10.8)

## 2018-11-27 LAB — BASIC METABOLIC PANEL WITH GFR
BUN / CREAT RATIO: 18 (calc) (ref 6–22)
BUN: 28 mg/dL — ABNORMAL HIGH (ref 7–25)
CO2: 24 mmol/L (ref 20–32)
CREATININE: 1.55 mg/dL — AB (ref 0.60–0.88)
Calcium: 9.5 mg/dL (ref 8.6–10.4)
Chloride: 101 mmol/L (ref 98–110)
GFR, EST AFRICAN AMERICAN: 34 mL/min/{1.73_m2} — AB (ref >=60)
GFR, EST NON AFRICAN AMERICAN: 29 mL/min/{1.73_m2} — AB (ref >=60)
Glucose, Bld: 113 mg/dL (ref 65–139)
Potassium: 3.9 mmol/L (ref 3.5–5.3)
Sodium: 137 mmol/L (ref 135–146)

## 2018-11-27 LAB — URINE CULTURE
MICRO NUMBER: 91520605
SPECIMEN QUALITY: ADEQUATE

## 2018-11-27 LAB — TSH: TSH: 3.99 mIU/L (ref 0.40–4.50)

## 2018-11-27 LAB — MRSA PCR SCREENING: MRSA by PCR: NEGATIVE

## 2018-11-27 MED ORDER — METOPROLOL TARTRATE 50 MG PO TABS
50.0000 mg | ORAL_TABLET | Freq: Two times a day (BID) | ORAL | Status: DC
  Administered 2018-11-27 – 2018-11-28 (×4): 50 mg via ORAL
  Filled 2018-11-27 (×6): qty 1

## 2018-11-27 MED ORDER — MORPHINE SULFATE (PF) 2 MG/ML IV SOLN
2.0000 mg | INTRAVENOUS | Status: DC | PRN
  Administered 2018-11-27 – 2018-11-28 (×2): 2 mg via INTRAVENOUS
  Filled 2018-11-27 (×2): qty 1

## 2018-11-27 MED ORDER — ONDANSETRON HCL 4 MG/2ML IJ SOLN: 4.0000 mg | Freq: Four times a day (QID) | INTRAMUSCULAR | Status: DC | PRN

## 2018-11-27 MED ORDER — FUROSEMIDE 40 MG PO TABS
40.0000 mg | ORAL_TABLET | Freq: Every day | ORAL | Status: DC
  Administered 2018-11-27 – 2018-11-29 (×3): 40 mg via ORAL
  Filled 2018-11-27 (×3): qty 1

## 2018-11-27 MED ORDER — VENLAFAXINE HCL 75 MG PO TABS
75.0000 mg | ORAL_TABLET | Freq: Two times a day (BID) | ORAL | Status: DC
  Administered 2018-11-27 – 2018-11-29 (×5): 75 mg via ORAL
  Filled 2018-11-27 (×6): qty 1

## 2018-11-27 MED ORDER — SODIUM CHLORIDE 0.9% FLUSH
3.0000 mL | Freq: Two times a day (BID) | INTRAVENOUS | Status: DC
  Administered 2018-11-27 – 2018-11-29 (×6): 3 mL via INTRAVENOUS

## 2018-11-27 MED ORDER — DOCUSATE SODIUM 100 MG PO CAPS
100.0000 mg | ORAL_CAPSULE | Freq: Every day | ORAL | Status: DC
  Administered 2018-11-27 – 2018-11-28 (×3): 100 mg via ORAL
  Filled 2018-11-27 (×4): qty 1

## 2018-11-27 MED ORDER — VITAMIN D 25 MCG (1000 UNIT) PO TABS
2000.0000 [IU] | ORAL_TABLET | Freq: Every day | ORAL | Status: DC
  Administered 2018-11-27 – 2018-11-29 (×3): 2000 [IU] via ORAL
  Filled 2018-11-27 (×3): qty 2

## 2018-11-27 MED ORDER — HEPARIN SODIUM (PORCINE) 5000 UNIT/ML IJ SOLN
5000.0000 [IU] | Freq: Three times a day (TID) | INTRAMUSCULAR | Status: DC
  Administered 2018-11-27: 5000 [IU] via SUBCUTANEOUS
  Filled 2018-11-27: qty 1

## 2018-11-27 MED ORDER — ONDANSETRON HCL 4 MG PO TABS
4.0000 mg | ORAL_TABLET | Freq: Four times a day (QID) | ORAL | Status: DC | PRN
  Administered 2018-11-28: 4 mg via ORAL
  Filled 2018-11-27: qty 1

## 2018-11-27 MED ORDER — KETOROLAC TROMETHAMINE 15 MG/ML IJ SOLN
15.0000 mg | Freq: Four times a day (QID) | INTRAMUSCULAR | Status: DC | PRN
  Administered 2018-11-27 – 2018-11-28 (×3): 15 mg via INTRAVENOUS
  Filled 2018-11-27 (×3): qty 1

## 2018-11-27 MED ORDER — ACETAMINOPHEN 325 MG PO TABS
650.0000 mg | ORAL_TABLET | Freq: Four times a day (QID) | ORAL | Status: DC | PRN
  Administered 2018-11-28: 650 mg via ORAL
  Filled 2018-11-27 (×2): qty 2

## 2018-11-27 MED ORDER — SODIUM CHLORIDE 0.9 % IV SOLN: 250.0000 mL | INTRAVENOUS | Status: DC | PRN

## 2018-11-27 MED ORDER — DONEPEZIL HCL 10 MG PO TABS
10.0000 mg | ORAL_TABLET | Freq: Every day | ORAL | Status: DC
  Administered 2018-11-27 – 2018-11-28 (×3): 10 mg via ORAL
  Filled 2018-11-27 (×4): qty 1

## 2018-11-27 MED ORDER — SODIUM CHLORIDE 0.9% FLUSH: 3.0000 mL | INTRAVENOUS | Status: DC | PRN

## 2018-11-27 MED ORDER — AMLODIPINE BESYLATE 10 MG PO TABS
10.0000 mg | ORAL_TABLET | Freq: Every day | ORAL | Status: DC
  Administered 2018-11-27 – 2018-11-29 (×3): 10 mg via ORAL
  Filled 2018-11-27 (×3): qty 1

## 2018-11-27 MED ORDER — APIXABAN 2.5 MG PO TABS
2.5000 mg | ORAL_TABLET | Freq: Two times a day (BID) | ORAL | Status: DC
  Administered 2018-11-27 – 2018-11-29 (×4): 2.5 mg via ORAL
  Filled 2018-11-27 (×5): qty 1

## 2018-11-27 MED ORDER — LOSARTAN POTASSIUM 25 MG PO TABS
25.0000 mg | ORAL_TABLET | Freq: Every day | ORAL | Status: DC
  Administered 2018-11-27 – 2018-11-29 (×3): 25 mg via ORAL
  Filled 2018-11-27 (×3): qty 1

## 2018-11-27 NOTE — Progress Notes (Signed)
Patient will resume eliquis todnight Dr. Kyung Bacca orders.

## 2018-11-27 NOTE — Telephone Encounter (Signed)
LMOM to return call.

## 2018-11-27 NOTE — Progress Notes (Signed)
NURSING PROGRESS NOTE  Jaylea Plourde [id number] Admission Data: 11/27/2018 6:56 AM Attending Provider: Cristal Deer, MD [id number], Rexene Edison, DO Code Status: Limited  Brittney Gutierrez is a 84 y.o. female patient admitted from ED:  -No acute distress noted.  -No complaints of shortness of breath.  -No complaints of chest pain.   Cardiac Monitoring: Box # 30 in place. Cardiac monitor yields:normal sinus rhythm.  Blood pressure (!) 128/41, pulse (!) 59, temperature 98 F (36.7 C), temperature source Oral, resp. rate 18, height 5\' 2"  (1.575 m), weight 59.8 kg, SpO2 94 %.   IV Fluids:  IV in place, occlusive dsg intact without redness, IV cath upper arm right, condition patent and no redness none.   Allergies:  Tape; Bactrim [sulfamethoxazole-trimethoprim]; Amiodarone; Amoxicillin; Demerol [meperidine]; Hydralazine; Lisinopril; and Zoloft [sertraline hcl]  Past Medical History:   has a past medical history of Abnormal weight loss (05/28/2004), Anxiety (01/17/2003), Back pain (08/19/2005), Bradycardia, drug induced, Cerebral atherosclerosis (09/24/1999), Chest pain, atypical (04/2016), CHF (congestive heart failure) ([place]), Chronic anticoagulation, Dementia ([place]), Depression (09/06/2002), Diverticulitis (09/28/2007), External hemorrhoids (04/12/2009), Female climacteric state (03/04/2000), Formed visual hallucinations, GERD (gastroesophageal reflux disease), Hypertensive cardiovascular disease, Macular degeneration, Malaise and fatigue, Neurocysticercosis, Osteoarthrosis, localized, Osteoporosis (05/28/2004), Paranoia ([place]), Paroxysmal A-fib ([place]), TIA (transient ischemic attack) (2000s?), Urinary incontinence (08/18/2007), UTI (urinary tract infection), Vaginitis, atrophic, and Vertigo, peripheral (10/07/2000).  Past Surgical History:   has a past surgical history that includes Shoulder arthroscopy w/ rotator cuff repair (Right, X 3); Total knee arthroplasty (Left, 1990); Cataract extraction (2013);  Vaginal hysterectomy (1960); Joint replacement; Brain surgery (2000); Cataract extraction, bilateral; Cardiac catheterization (N/A, 05/03/2016); Cardiac catheterization (N/A, 05/03/2016); TEE without cardioversion (N/A, 07/14/2017); and Cardioversion (N/A, 07/14/2017).  Social History:   reports that she has never smoked. She has never used smokeless tobacco. She reports that she does not drink alcohol or use drugs.  Skin: Patient has scattered bruising on her left lower extremity and sacrum.   Patient/Family orientated to room. Information packet given to patient/family. Admission inpatient armband information verified with patient/family to include name and date of birth and placed on patient arm. Side rails up x 2, fall assessment and education completed with patient/family. Patient/family able to verbalize understanding of risk associated with falls and verbalized understanding to call for assistance before getting out of bed. Call light within reach. Patient/family able to voice and demonstrate understanding of unit orientation instructions.    Will continue to evaluate and treat per MD orders.

## 2018-11-27 NOTE — Plan of Care (Signed)

## 2018-11-27 NOTE — H&P (Signed)
History and Physical   Brittney Gutierrez MGQ:[id number] DOB: 1927/10/30 DOA: 11/26/2018  Referring MD/NP/PA: Dr. Laverta Baltimore  PCP: Gayland Curry, DO   Outpatient Specialists: None  Patient coming from: Independent living at [facility]  Chief Complaint: Fall with back pain  HPI: Brittney Gutierrez is a 84 y.o. female with medical history significant of atrial fibrillation on chronic anticoagulation with Eliquis, hypertension, dementia, previous TIA, coronary artery disease diastolic dysfunction CHF, hypertension, degenerative disc disease and diverticulosis with positive arthritis who apparently had a fall yesterday at her facility.  Patient started experiencing significant pain in the hips and lower back pain.  She went to see her PCP who ran blood tests and x-ray of the hip showed no acute findings.  She has been progressively declining in the last 2 to 3 days with altered mental status.  She has had a similar presentation before when she had UTI so family suspected UTI however urinalysis given here in the ER was normal.  Patient was also complained of abdominal pain.  CT abdomen pelvis showed L2 compression fracture.  Patient is having pain after 10 out of 10.  Pain is usually with any slight movement.  Is relieved by complete rest in bed.  She is being admitted therefore for medical treatment and pain control.  ED Course: Her temperature is 97.5 blood pressure 164/55 pulse 80 respiratory rate of 18 sats 91% room air.  Hemoglobin is 10.5 potassium 3.3 CO2 21 BUN 24 creatinine 1.50 and glucose 137.  Normal lactic acid.  Urinalysis negative for evidence of UTI CT abdomen pelvis showing no acute abdominal findings except for age indeterminate L2 compression fracture involving the endplates.  Patient has received initial IV pain medication in the ER and is being admitted for treatment  Review of Systems: As per HPI otherwise 10 point review of systems negative.    Past Medical History:  Diagnosis  Date  . Abnormal weight loss 05/28/2004  . Anxiety 01/17/2003  . Back pain 08/19/2005   with radiculopathy  . Bradycardia, drug induced    Diltiazem  . Cerebral atherosclerosis 09/24/1999  . Chest pain, atypical 04/2016   minor CAD at cath   . CHF (congestive heart failure) ([place])   . Chronic anticoagulation    Eliquis  . Dementia ([place])    "don't know kind or stage" (05/02/2016)  . Depression 09/06/2002  . Diverticulitis 09/28/2007  . External hemorrhoids 04/12/2009  . Female climacteric state 03/04/2000  . 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 GERD (gastroesophageal reflux disease)   . Hypertensive cardiovascular disease    moderate LVH on echo march 2017  . Macular degeneration   . Malaise and fatigue   . Neurocysticercosis    Craniotomy at [facility] in early 2000s  . Osteoarthrosis, localized   . Osteoporosis 05/28/2004  . Paranoia ([place])   . Paroxysmal A-fib ([place])   . TIA (transient ischemic attack) 2000s?  [name] Urinary incontinence 08/18/2007  . UTI (urinary tract infection)   . Vaginitis, atrophic   . Vertigo, peripheral 10/07/2000    Past Surgical History:  Procedure Laterality Date  . BRAIN SURGERY  2000   Neurocysticercosis ("parasite")  . CARDIAC CATHETERIZATION N/A 05/03/2016   Procedure: Left Heart Cath and Coronary Angiography;  Surgeon: Sherren Mocha, MD;  Location: [facility];  Service: Cardiovascular;  Laterality: N/A;  . CARDIOVERSION N/A 07/14/2017   Procedure: CARDIOVERSION;  Surgeon: Pixie Casino, MD;  Location: [facility]  ENDOSCOPY;  Service: Cardiovascular;  Laterality: N/A;  . CATARACT EXTRACTION  2013  . CATARACT EXTRACTION, BILATERAL     "not sure if both eyes; feel like it probably was"  . JOINT REPLACEMENT    . PERIPHERAL VASCULAR CATHETERIZATION N/A 05/03/2016   Procedure: Abdominal Aortogram;  Surgeon: Sherren Mocha, MD;  Location: [facility];  Service: Cardiovascular;  Laterality: N/A;  .  SHOULDER ARTHROSCOPY W/ ROTATOR CUFF REPAIR Right X 3  . TEE WITHOUT CARDIOVERSION N/A 07/14/2017   Procedure: TRANSESOPHAGEAL ECHOCARDIOGRAM (TEE);  Surgeon: Pixie Casino, MD;  Location: [facility];  Service: Cardiovascular;  Laterality: N/A;  . TOTAL KNEE ARTHROPLASTY Left 1990  . VAGINAL HYSTERECTOMY  1960     reports that she has never smoked. She has never used smokeless tobacco. She reports that she does not drink alcohol or use drugs.  Allergies  Allergen Reactions  . Tape Rash and Other (See Comments)    TEARS THE SKIN!!  . Bactrim [Sulfamethoxazole-Trimethoprim] Nausea Only  . Amiodarone Itching  . Amoxicillin Other (See Comments)    Unknown- ? Upset stomach  . Demerol [Meperidine] Other (See Comments)    Hallucinations  . Hydralazine Other (See Comments)    Tingling and chest pain   . Lisinopril Cough  . Zoloft [Sertraline Hcl] Other (See Comments)    Unknown    Family History  Problem Relation Age of Onset  . Polycystic kidney disease Mother   . Heart attack Father   . Hypertension Sister   . Dementia Neg Hx      Prior to Admission medications   Medication Sig Start Date End Date Taking? Authorizing Provider  acetaminophen (TYLENOL) 325 MG tablet Take 650 mg by mouth every 6 (six) hours as needed for mild pain.   Yes [provider]  amLODipine (NORVASC) 10 MG tablet Take 1 tablet (10 mg total) by mouth daily. 08/31/18  Yes Reed, Tiffany L, DO  Cholecalciferol (VITAMIN D3) 2000 units TABS Take 2,000 Units by mouth daily.    Yes [provider]  docusate sodium (COLACE) 100 MG capsule Take 100 mg by mouth at bedtime.    Yes [provider]  donepezil (ARICEPT) 10 MG tablet Take 1 tablet (10 mg total) by mouth daily. Patient taking differently: Take 10 mg by mouth at bedtime.  10/01/18  Yes Reed, Tiffany L, DO  ELIQUIS 2.5 MG TABS tablet TAKE 1 TABLET TWICE DAILY Patient taking differently: Take 2.5 mg by mouth 2 (two) times daily.   06/04/18  Yes Croitoru, Mihai, MD  furosemide (LASIX) 40 MG tablet Take 1-2 tablets (40-80 mg total) by mouth daily as directed. Patient taking differently: Take 40 mg by mouth daily.  01/01/18  Yes Croitoru, Mihai, MD  losartan (COZAAR) 25 MG tablet TAKE 1 TABLET EVERY DAY Patient taking differently: Take 25 mg by mouth daily.  06/04/18  Yes Eileen Stanford, PA-C  metoprolol tartrate (LOPRESSOR) 50 MG tablet Take 1 tablet (50 mg total) by mouth 2 (two) times daily. 08/31/18  Yes Reed, Tiffany L, DO  UNABLE TO FIND Lift Chair due to proximal muscle weakness, frailty 11/13/18  Yes Reed, Tiffany L, DO  venlafaxine (EFFEXOR) 75 MG tablet Take 1 tablet (75 mg total) 2 (two) times daily by mouth. 10/20/17  Yes Reed, Tiffany L, DO  traMADol (ULTRAM) 50 MG tablet Take 0.5-1 tablets (25-50 mg total) by mouth every 6 (six) hours as needed. Patient taking differently: Take 25-50 mg by mouth every 6 (six)  hours as needed (for pain).  11/26/18   Lauree Chandler, NP    Physical Exam: Vitals:   11/26/18 2000 11/26/18 2030 11/26/18 2200 11/26/18 2245  BP: (!) 164/55 (!) 158/90 (!) 152/52 (!) 153/72  Pulse: 61 78 80 74  Resp:   16 18  Temp:      TempSrc:      SpO2: 99% 100% 91% 95%      Constitutional: Patient in pain with mild distress Vitals:   11/26/18 2000 11/26/18 2030 11/26/18 2200 11/26/18 2245  BP: (!) 164/55 (!) 158/90 (!) 152/52 (!) 153/72  Pulse: 61 78 80 74  Resp:   16 18  Temp:      TempSrc:      SpO2: 99% 100% 91% 95%   Eyes: PERRL, lids and conjunctivae normal ENMT: Mucous membranes are moist. Posterior pharynx clear of any exudate or lesions.Normal dentition.  Neck: normal, supple, no masses, no thyromegaly Respiratory: clear to auscultation bilaterally, no wheezing, no crackles. Normal respiratory effort. No accessory muscle use.  Cardiovascular: Irregularly irregular rate and rhythm, no murmurs / rubs / gallops. No extremity edema. 2+ pedal pulses. No carotid bruits.    Abdomen: no tenderness, no masses palpated. No hepatosplenomegaly. Bowel sounds positive.  Musculoskeletal: Point tenderness in the lower lumbar region with pain with movement no clubbing / cyanosis. No joint deformity upper and lower extremities. Good ROM, no contractures. Normal muscle tone.  Skin: no rashes, lesions, ulcers. No induration Neurologic: CN 2-12 grossly intact. Sensation intact, DTR normal. Strength 5/5 in all 4.  Psychiatric: Slightly confused alert and oriented x 3. Normal mood.     Labs on Admission: I have personally reviewed following labs and imaging studies  CBC: Recent Labs  Lab 11/26/18 1144 11/26/18 2055  WBC 8.3 9.0  NEUTROABS 6,499 6.9  HGB 11.1* 10.5*  HCT 32.6* 32.6*  MCV 90.1 95.9  PLT 332 270   Basic Metabolic Panel: Recent Labs  Lab 11/26/18 2055  NA 136  K 3.3*  CL 100  CO2 21*  GLUCOSE 137*  BUN 24*  CREATININE 1.50*  CALCIUM 9.1   GFR: Estimated Creatinine Clearance: 21 mL/min (A) (by C-G formula based on SCr of 1.5 mg/dL (H)). Liver Function Tests: Recent Labs  Lab 11/26/18 2055  AST 27  ALT 21  ALKPHOS 72  BILITOT 0.9  PROT 7.1  ALBUMIN 3.3*   Recent Labs  Lab 11/26/18 2055  LIPASE 37   No results for input(s): AMMONIA in the last 168 hours. Coagulation Profile: No results for input(s): INR, PROTIME in the last 168 hours. Cardiac Enzymes: No results for input(s): CKTOTAL, CKMB, CKMBINDEX, TROPONINI in the last 168 hours. BNP (last 3 results) No results for input(s): PROBNP in the last 8760 hours. HbA1C: No results for input(s): HGBA1C in the last 72 hours. CBG: No results for input(s): GLUCAP in the last 168 hours. Lipid Profile: No results for input(s): CHOL, HDL, LDLCALC, TRIG, CHOLHDL, LDLDIRECT in the last 72 hours. Thyroid Function Tests: Recent Labs    11/26/18 1144  TSH 3.99   Anemia Panel: No results for input(s): VITAMINB12, FOLATE, FERRITIN, TIBC, IRON, RETICCTPCT in the last 72 hours. Urine  analysis:    Component Value Date/Time   COLORURINE YELLOW 11/26/2018 2237   APPEARANCEUR CLEAR 11/26/2018 2237   LABSPEC 1.010 11/26/2018 2237   PHURINE 7.0 11/26/2018 2237   GLUCOSEU NEGATIVE 11/26/2018 2237   HGBUR MODERATE (A) 11/26/2018 2237   BILIRUBINUR NEGATIVE 11/26/2018 2237   BILIRUBINUR  Neg 11/26/2018 1123   KETONESUR NEGATIVE 11/26/2018 2237   PROTEINUR NEGATIVE 11/26/2018 2237   UROBILINOGEN 0.2 11/26/2018 1123   NITRITE NEGATIVE 11/26/2018 2237   LEUKOCYTESUR NEGATIVE 11/26/2018 2237   Sepsis Labs: @LABRCNTIP (procalcitonin:4,lacticidven:4) )No results found for this or any previous visit (from the past 240 hour(s)).   Radiological Exams on Admission: Ct Abdomen Pelvis W Contrast  Result Date: 11/26/2018 CLINICAL DATA:  Fall.  Confusion. EXAM: CT ABDOMEN AND PELVIS WITH CONTRAST TECHNIQUE: Multidetector CT imaging of the abdomen and pelvis was performed using the standard protocol following bolus administration of intravenous contrast. CONTRAST:  36mL OMNIPAQUE IOHEXOL 300 MG/ML  SOLN COMPARISON:  Lumbar spine x-rays dated January 21, 2017. FINDINGS: Lower chest: Cardiomegaly. Bibasilar subsegmental atelectasis and mild interlobular septal thickening. Trace left pleural effusion. Hepatobiliary: Tiny calcified granulomas in the right hepatic lobe. Status post cholecystectomy. Mild intra and extrahepatic biliary dilatation, likely related to post cholecystectomy state. Pancreas: Mild main pancreatic duct dilatation, measuring 4 mm. No mass or surrounding inflammatory changes. Spleen: Normal in size without focal abnormality. Adrenals/Urinary Tract: The right adrenal gland is unremarkable. Mild left adrenal thickening without focal nodule. Moderately atrophic right kidney delayed enhancement and excretion of contrast. Subcentimeter low-density lesions in both kidneys are too small to characterize. No renal or ureteral calculi. No hydronephrosis. Bladder is unremarkable.  Stomach/Bowel: Small hiatal hernia. The stomach is otherwise within normal limits. No bowel wall thickening, distention, or surrounding inflammatory changes. Extensive distal descending and sigmoid colonic diverticulosis. The appendix is not identified, but there are no signs of inflammation at the base of the cecum. Vascular/Lymphatic: Aortoiliac atherosclerotic vascular disease. No aneurysm. Severe stenosis of the right renal artery origin. No enlarged abdominal or pelvic lymph nodes. Reproductive: Status post hysterectomy. No adnexal masses. Other: No abdominal wall hernia or abnormality. No abdominopelvic ascites. No pneumoperitoneum. Musculoskeletal: Age-indeterminate mild L2 superior endplate compression deformity, new since February 2018. Moderate to severe lumbar spondylosis. Moderate to severe right and mild left hip osteoarthritis. IMPRESSION: 1.  No acute intra-abdominal process. 2. Age-indeterminate mild L2 superior endplate compression deformity, new since February 2018. Correlate with point tenderness. 3. Mild interstitial pulmonary edema.  Trace left pleural effusion. 4. Moderate right renal atrophy likely related to longstanding hypoperfusion given severe right renal artery origin stenosis. 5.  Aortic atherosclerosis (ICD10-I70.0). Electronically Signed   By: Titus Dubin M.D.   On: 11/26/2018 23:29   Dg Hip Unilat With Pelvis 2-3 Views Left  Result Date: 11/26/2018 CLINICAL DATA:  Left hip pain. EXAM: DG HIP (WITH OR WITHOUT PELVIS) 2-3V LEFT COMPARISON:  No prior. FINDINGS: Pelvic calcifications consistent phleboliths. Calcifications noted the buttock consistent with injection granulomas. No acute bony abnormality identified. No evidence of fracture or dislocation. Peripheral vascular calcification. IMPRESSION: 1.  Degenerative changes left hip. 2.  No acute bony or joint abnormality. 3.  Peripheral vascular disease. Electronically Signed   By: Marcello Moores  Register   On: 11/26/2018 17:22       Assessment/Plan Principal Problem:   Closed compression fracture of L2 lumbar vertebra, initial encounter ([place]) Active Problems:   Persistent atrial fibrillation   Dementia (HCC)   Hypokalemia   Essential hypertension   SSS (sick sinus syndrome) (HCC)   DDD (degenerative disc disease), lumbar   Left hip pain   Long term current use of anticoagulant   Major depressive disorder with single episode, in partial remission ([place])   Fall     #1 L2 compression fracture: Patient will be admitted for pain control.  Physical  therapy and Occupational Therapy.  Patient may be a candidate for kyphoplasty but not sure.  She may need to be upgraded to skilled level of care at discharge.  Strict bedrest for tonight.  Patient may require MRI of the lumbar spine if she can stay still to evaluate.  Last MRI was in March of September last year with no disease at the L2 area.  #2 atrial fibrillation: Patient has been on Eliquis and rate is controlled.  I will hold Eliquis in case patient may require kyphoplasty.  Also to avoid bleeds immediately.  Use subcutaneous heparin temporarily.  #3 dementia: Continue with Aricept  #4 hypokalemia: Replete potassium.  #5 hypertension: Continue close treatment.  #6 major depressive disorder: Continue with home regimen.  #7 sick sinus syndrome: Patient being followed by cardiology.  She has refused pacemaker placement.  She is stable at the moment   DVT prophylaxis: Heparin Code Status: Limited code Family Communication: Daughters at bedside Disposition Plan: To be determined Consults called: None Admission status: Inpatient  Severity of Illness: The appropriate patient status for this patient is INPATIENT. Inpatient status is judged to be reasonable and necessary in order to provide the required intensity of service to ensure the patient's safety. The patient's presenting symptoms, physical exam findings, and initial radiographic and laboratory data in  the context of their chronic comorbidities is felt to place them at high risk for further clinical deterioration. Furthermore, it is not anticipated that the patient will be medically stable for discharge from the hospital within 2 midnights of admission. The following factors support the patient status of inpatient.   " The patient's presenting symptoms include back pain with abdominal pain. " The worrisome physical exam findings include mild altered mental status with point tenderness in the. " The initial radiographic and laboratory data are worrisome because of CT abdomen pelvis showing L2 compression fracture. " The chronic co-morbidities include degenerative disc disease.   * I certify that at the point of admission it is my clinical judgment that the patient will require inpatient hospital care spanning beyond 2 midnights from the point of admission due to high intensity of service, high risk for further deterioration and high frequency of surveillance required.Barbette Merino MD [facility] Pager [phone]  If 7PM-7AM, please contact night-coverage [email or web address] Password [email or web address]  11/27/2018, 12:03 AM

## 2018-11-27 NOTE — Progress Notes (Addendum)
PROGRESS NOTE  Eurika Sandy ZOX:[id number] DOB: 06-Dec-1927 DOA: 11/26/2018 PCP: Gayland Curry, DO  HPI/Recap of past 24 hours: HPI: Prestyn Mahn is a 84 y.o. female  with past medical history significant for atrial fibrillation on chronic anticoagulation with Eliquis, hypertension, dementia previous TIA, coronary artery disease, diastolic dysfunction, dementia degenerative disc disease and diverticulosis as well as arthritis patient was admitted due to a fall on the day before admission at a facility she lives in an independent living facility.  She had complained of pain in the hip and the lower back after the fall and was seen by her primary care provider, so x-ray was done and it showed some fracture.  She was also said to have been progressively declining with altered mental status 2 to 3 days prior to her fall so when she went to the emergency PCP he ran a urine test because she had had a similar presentation with when she had a urinary tract infection that presented initially with altered mental status but her urinalysis in the emergency room was negative for UTI CT scan of the abdomen and pelvis that was done in the emergency department showed level 2 compression fracture.  She was admitted for pain control and possible kyphoplasty.    Subjective: Patient is complaining that her pain is under controlled now at this moment when I was talking to her she has received some tramadol  Assessment/Plan: Principal Problem:   Closed compression fracture of L2 lumbar vertebra, initial encounter ([place]) Active Problems:   Persistent atrial fibrillation   Dementia (HCC)   Hypokalemia   Essential hypertension   SSS (sick sinus syndrome) (HCC)   DDD (degenerative disc disease), lumbar   Left hip pain   Long term current use of anticoagulant   Major depressive disorder with single episode, in partial remission ([place])   Fall  1.  Fall at the facility level ground  2.  L2 compression  fracture.  Occupational Therapy PT therapy has been ordered and a PT has just completed their evaluation..  I have requested neurology consult to see if she is a candidate for kyphoplasty spoke with Joelene Millin at [facility] neurosurgery she will give her recommendation after evaluating patient  Addendum:Consulted with [place] neurosurgery to have reviewed her x-ray and MRI they feel that this is an old compression fracture and she is not a candidate for kyphoplasty because she is on blood thinners.  Also she does not need back brace.  So patient can be discharged home in 1 or 2 days if her pain is under better control.  We will restart her Eliquis   3.  Dementia patient is at her baseline her urinalysis was negative for any urinary tract infection  4. hypertension stable blood pressures are running in the low 160s and 150s  Code Status: Modified code  Severity of Illness: The appropriate patient status for this patient is INPATIENT. Inpatient status is judged to be reasonable and necessary in order to provide the required intensity of service to ensure the patient's safety. The patient's presenting symptoms, physical exam findings, and initial radiographic and laboratory data in the context of their chronic comorbidities is felt to place them at high risk for further clinical deterioration. Furthermore, it is not anticipated that the patient will be medically stable for discharge from the hospital within 2 midnights of admission. The following factors support the patient status of inpatient.   " The patient's presenting symptoms include pain due to fall. " The worrisome  physical exam findings include pain in the lumbar spine. " The initial radiographic and laboratory data are worrisome because of compression fracture at L2. " The chronic co-morbidities include atrial fibrillation.   * I certify that at the point of admission it is my clinical judgment that the patient will require inpatient  hospital care spanning beyond 2 midnights from the point of admission due to high intensity of service, high risk for further deterioration and high frequency of surveillance required.*    Family Communication: 2 daughters at bedside Helene Kelp and [name] updated them on plan of care  Disposition Plan: Possibly higher level of care than the independent living facility   Consultants:  Neurosurgery [place] [facility], Joelene Millin  Procedures:  CT  Antimicrobials:  None  DVT prophylaxis: Heparin   Objective: Vitals:   11/27/18 0200 11/27/18 0244 11/27/18 0459 11/27/18 0758  BP: (!) 153/63  (!) 128/41 (!) 149/45  Pulse: 68  (!) 59 63  Resp: 18  18 16   Temp: 98.2 F (36.8 C)  98 F (36.7 C) 98.3 F (36.8 C)  TempSrc: Oral  Oral Axillary  SpO2: 96%  94% 95%  Weight:  59.8 kg    Height:  5\' 2"  (1.575 m)      Intake/Output Summary (Last 24 hours) at 11/27/2018 1710 Last data filed at 11/27/2018 1418 Gross per 24 hour  Intake 118 ml  Output 200 ml  Net -82 ml   Filed Weights   11/27/18 0244  Weight: 59.8 kg   Body mass index is 24.11 kg/m.  Exam:  . General: 84 y.o. year-old female well developed well nourished in no acute distress.  Alert and oriented x3. . Cardiovascular: Regular rate and rhythm with no rubs or gallops.  No thyromegaly or JVD noted.   [name] Respiratory: Clear to auscultation with no wheezes or rales. Good inspiratory effort. . Abdomen: Soft nontender nondistended with normal bowel sounds x4 quadrants. . Musculoskeletal: No lower extremity edema. 2/4 pulses in all 4 extremities. . Skin: No ulcerative lesions noted or rashes, . Psychiatry: Mood is appropriate for condition and setting    Data Reviewed: CBC: Recent Labs  Lab 11/26/18 1144 11/26/18 2055 11/27/18 0532  WBC 8.3 9.0 6.4  NEUTROABS 6,499 6.9  --   HGB 11.1* 10.5* 9.8*  HCT 32.6* 32.6* 30.7*  MCV 90.1 95.9 94.5  PLT 332 323 376   Basic Metabolic Panel: Recent Labs  Lab  11/26/18 1144 11/26/18 2055 11/27/18 0532  NA 137 136 138  K 3.9 3.3* 3.2*  CL 101 100 103  CO2 24 21* 24  GLUCOSE 113 137* 96  BUN 28* 24* 19  CREATININE 1.55* 1.50* 1.49*  CALCIUM 9.5 9.1 8.7*   GFR: Estimated Creatinine Clearance: 20.2 mL/min (A) (by C-G formula based on SCr of 1.49 mg/dL (H)). Liver Function Tests: Recent Labs  Lab 11/26/18 2055 11/27/18 0532  AST 27 109*  ALT 21 63*  ALKPHOS 72 121  BILITOT 0.9 1.6*  PROT 7.1 6.2*  ALBUMIN 3.3* 2.8*   Recent Labs  Lab 11/26/18 2055  LIPASE 37   No results for input(s): AMMONIA in the last 168 hours. Coagulation Profile: No results for input(s): INR, PROTIME in the last 168 hours. Cardiac Enzymes: No results for input(s): CKTOTAL, CKMB, CKMBINDEX, TROPONINI in the last 168 hours. BNP (last 3 results) No results for input(s): PROBNP in the last 8760 hours. HbA1C: No results for input(s): HGBA1C in the last 72 hours. CBG: No results for input(s): GLUCAP  in the last 168 hours. Lipid Profile: No results for input(s): CHOL, HDL, LDLCALC, TRIG, CHOLHDL, LDLDIRECT in the last 72 hours. Thyroid Function Tests: Recent Labs    11/26/18 1144  TSH 3.99   Anemia Panel: No results for input(s): VITAMINB12, FOLATE, FERRITIN, TIBC, IRON, RETICCTPCT in the last 72 hours. Urine analysis:    Component Value Date/Time   COLORURINE YELLOW 11/26/2018 2237   APPEARANCEUR CLEAR 11/26/2018 2237   LABSPEC 1.010 11/26/2018 2237   PHURINE 7.0 11/26/2018 2237   GLUCOSEU NEGATIVE 11/26/2018 2237   HGBUR MODERATE (A) 11/26/2018 2237   BILIRUBINUR NEGATIVE 11/26/2018 2237   BILIRUBINUR Neg 11/26/2018 1123   KETONESUR NEGATIVE 11/26/2018 2237   PROTEINUR NEGATIVE 11/26/2018 2237   UROBILINOGEN 0.2 11/26/2018 1123   NITRITE NEGATIVE 11/26/2018 2237   LEUKOCYTESUR NEGATIVE 11/26/2018 2237   Sepsis Labs: @LABRCNTIP (procalcitonin:4,lacticidven:4)  ) Recent Results (from the past 240 hour(s))  Culture, Urine     Status: None     Collection Time: 11/26/18 11:06 AM  Result Value Ref Range Status   MICRO NUMBER: [id number]  Final   SPECIMEN QUALITY: Adequate  Final   Sample Source URINE, CLEAN CATCH  Final   STATUS: FINAL  Final   Result:   Final    Multiple organisms present, each less than 10,000 CFU/mL. These organisms, commonly found on external and internal genitalia, are considered to be colonizers. No further testing performed.  MRSA PCR Screening     Status: None   Collection Time: 11/27/18  1:52 AM  Result Value Ref Range Status   MRSA by PCR NEGATIVE NEGATIVE Final    Comment:        The GeneXpert MRSA Assay (FDA approved for NASAL specimens only), is one component of a comprehensive MRSA colonization surveillance program. It is not intended to diagnose MRSA infection nor to guide or monitor treatment for MRSA infections. Performed at [facility], [place] [address], [place], [place] [address]       Studies: Mr Lumbar Spine Wo Contrast  Result Date: 11/27/2018 CLINICAL DATA:  84 year old female status post fall with age indeterminate mild L2 superior endplate compression fracture on recent CT. EXAM: MRI LUMBAR SPINE WITHOUT CONTRAST TECHNIQUE: Multiplanar, multisequence MR imaging of the lumbar spine was performed. No intravenous contrast was administered. COMPARISON:  CT Abdomen and Pelvis 11/26/2018. Lumbar radiographs 11/20/2017. FINDINGS: Segmentation:  Normal on the comparison CT. Alignment: Mild dextroconvex lumbar scoliosis. Stable straightening of lordosis from the CT yesterday. Mild grade 1 anterolisthesis at L2-L3 and L3-L4. Vertebrae: Chronic degenerative throughout the lumbar spine but endplate marrow changes but no marrow edema or evidence of acute osseous abnormality. The mild L2 superior endplate deformity is chronic (series 7, image 6). Incidental T12 vertebral body hemangioma. Intact visible sacrum and SI joints. Conus medullaris and cauda equina: Conus extends to the L1 level. No  lower spinal cord or conus signal abnormality. Paraspinal and other soft tissues: Negative visible abdominal viscera. Diverticulosis of the large bowel in the pelvis. Partially visible urinary bladder distension. Disc levels: No lower thoracic spinal stenosis. L1-L2: Mild to moderate posterior element hypertrophy without stenosis. L2-L3: Disc space loss and mild anterolisthesis. Circumferential disc bulge and moderate posterior element hypertrophy. Mild spinal and bilateral foraminal stenosis. L3-L4: Disc space loss with vacuum disc. Subtle anterolisthesis. Circumferential disc osteophyte complex and moderate to severe posterior element hypertrophy. Severe spinal and right lateral recess stenosis (right L4 nerve level). Severe left L3 foraminal stenosis. L4-L5: Disc space loss with circumferential disc bulge  and superimposed midline caudal disc extrusion (series 8, image 29). Moderate to severe posterior element hypertrophy. Moderate to severe spinal stenosis and right greater than left lateral recess stenosis (L5 nerve levels). Mild right L4 foraminal stenosis. L5-S1: Disc space loss with circumferential disc osteophyte complex and mild to moderate posterior element hypertrophy. Mild right lateral recess stenosis. Moderate bilateral L5 foraminal stenosis. IMPRESSION: 1. No acute osseous abnormality. The mild L2 superior endplate deformity is chronic. 2. Advanced lumbar spine degeneration except at L1-L2. Up to severe multifactorial spinal and lateral recess stenosis at L3-L4 and L4-L5. Severe left L3 and moderate bilateral L5 neural foraminal stenosis. Electronically Signed   By: Genevie Ann M.D.   On: 11/27/2018 15:08   Ct Abdomen Pelvis W Contrast  Result Date: 11/26/2018 CLINICAL DATA:  Fall.  Confusion. EXAM: CT ABDOMEN AND PELVIS WITH CONTRAST TECHNIQUE: Multidetector CT imaging of the abdomen and pelvis was performed using the standard protocol following bolus administration of intravenous contrast.  CONTRAST:  63mL OMNIPAQUE IOHEXOL 300 MG/ML  SOLN COMPARISON:  Lumbar spine x-rays dated January 21, 2017. FINDINGS: Lower chest: Cardiomegaly. Bibasilar subsegmental atelectasis and mild interlobular septal thickening. Trace left pleural effusion. Hepatobiliary: Tiny calcified granulomas in the right hepatic lobe. Status post cholecystectomy. Mild intra and extrahepatic biliary dilatation, likely related to post cholecystectomy state. Pancreas: Mild main pancreatic duct dilatation, measuring 4 mm. No mass or surrounding inflammatory changes. Spleen: Normal in size without focal abnormality. Adrenals/Urinary Tract: The right adrenal gland is unremarkable. Mild left adrenal thickening without focal nodule. Moderately atrophic right kidney delayed enhancement and excretion of contrast. Subcentimeter low-density lesions in both kidneys are too small to characterize. No renal or ureteral calculi. No hydronephrosis. Bladder is unremarkable. Stomach/Bowel: Small hiatal hernia. The stomach is otherwise within normal limits. No bowel wall thickening, distention, or surrounding inflammatory changes. Extensive distal descending and sigmoid colonic diverticulosis. The appendix is not identified, but there are no signs of inflammation at the base of the cecum. Vascular/Lymphatic: Aortoiliac atherosclerotic vascular disease. No aneurysm. Severe stenosis of the right renal artery origin. No enlarged abdominal or pelvic lymph nodes. Reproductive: Status post hysterectomy. No adnexal masses. Other: No abdominal wall hernia or abnormality. No abdominopelvic ascites. No pneumoperitoneum. Musculoskeletal: Age-indeterminate mild L2 superior endplate compression deformity, new since February 2018. Moderate to severe lumbar spondylosis. Moderate to severe right and mild left hip osteoarthritis. IMPRESSION: 1.  No acute intra-abdominal process. 2. Age-indeterminate mild L2 superior endplate compression deformity, new since February 2018.  Correlate with point tenderness. 3. Mild interstitial pulmonary edema.  Trace left pleural effusion. 4. Moderate right renal atrophy likely related to longstanding hypoperfusion given severe right renal artery origin stenosis. 5.  Aortic atherosclerosis (ICD10-I70.0). Electronically Signed   By: Titus Dubin M.D.   On: 11/26/2018 23:29    Scheduled Meds: . amLODipine  10 mg Oral Daily  . cholecalciferol  2,000 Units Oral Daily  . docusate sodium  100 mg Oral QHS  . donepezil  10 mg Oral QHS  . furosemide  40 mg Oral Daily  . heparin  5,000 Units Subcutaneous Q8H  . losartan  25 mg Oral Daily  . metoprolol tartrate  50 mg Oral BID  . sodium chloride flush  3 mL Intravenous Q12H  . venlafaxine  75 mg Oral BID    Continuous Infusions: . sodium chloride       LOS: 0 days     Cristal Deer, MD [facility]  To reach me or the doctor on call, go  to: [email or web address] Password [email or web address]  11/27/2018, 5:10 PM

## 2018-11-27 NOTE — Care Management Note (Signed)
Case Management Note  Patient Details  Name: Brittney Gutierrez MRN: [id number] Date of Birth: 10-02-1927  Subjective/Objective:       Closed Compression, fracture of L2  Lumbar Vertebra            Action/Plan: Patient resides at [place]; PCP: Gayland Curry, DO;; has private insurance with Medicare; CM will continue to follow for progression of care.  Expected Discharge Date:    Undetermined at this time              Expected Discharge Plan:   ILF vs SNF  Status of Service:   In progress  Sherrilyn Rist [phone] 11/27/2018, 2:36 PM

## 2018-11-27 NOTE — Evaluation (Signed)
Physical Therapy Evaluation Patient Details Name: Brittney Gutierrez MRN: [id number] DOB: 1927/10/27 Today's Date: 11/27/2018   History of Present Illness  Pt is an 84 y.o. female admitted from [facility] on 11/27/18 after falling the day prior with worsening hip and lower back pain; family notes AMS 2-3 days prior to fall, visit to PCP (-) for UTI. CT showed age-indeterminate mild L2 compression deformity. Lumbar MRI deemed L2 compression deformity as chronic. PMH includes dementia, poor vision, CHF, HTN, osteoporosis, a-fib.    Clinical Impression  Pt presents with an overall decrease in functional mobility secondary to above. PTA, pt resides in apartment at [facility]; ambulatory with and without rollator, remains mostly independent. Today, pt required minA for mobility with RW and assist for ADLs. Discussed recommendation for SNF-level therapies, but daughters worry about this with pt's cognition; pt may consider return to ILF with family and hired support for initial 24/7 assist. Educ daughters on back precautions and potential brace for comfort. Will follow acutely to address established goals.    Follow Up Recommendations SNF(vs. return to ILF with family providing 24/7 support and [facility] services)    Equipment Recommendations  None recommended by PT    Recommendations for Other Services       Precautions / Restrictions Precautions Precautions: Fall;Back Precaution Comments: "Age-indeterminate" L2 compression fx; discussed back precautions with daughter. Per RN, neurosurgery may provide back brace for comfort Restrictions Weight Bearing Restrictions: No      Mobility  Bed Mobility Overal bed mobility: Needs Assistance Bed Mobility: Supine to Sit;Sit to Supine     Supine to sit: Min assist Sit to supine: Min assist   General bed mobility comments: MinA for trunk elevation to prevent trunk twisting; minA to assist BLEs to supine  Transfers Overall transfer level: Needs assistance Equipment  used: Rolling walker (2 wheeled) Transfers: Sit to/from Stand Sit to Stand: Min assist         General transfer comment: Able to stand with min guard to RW, cues for correct hand placement. Stood 3x more during session requiring minA to maintain balance   Ambulation/Gait Ambulation/Gait assistance: Herbalist (Feet): 5 Feet Assistive device: Rolling walker (2 wheeled) Gait Pattern/deviations: Step-to pattern;Trunk flexed Gait velocity: Decreased   General Gait Details: Urine incontinence upon standing; able take side steps at EOB and back/forth with RW and intermittent minA to maintain balance.   Stairs            Wheelchair Mobility    Modified Rankin (Stroke Patients Only)       Balance Overall balance assessment: Needs assistance   Sitting balance-Leahy Scale: Good Sitting balance - Comments: Able to wash lower legs while seated EOB with min guard for balance; assist to don socks secondary to pain and back precautions     Standing balance-Leahy Scale: Poor Standing balance comment: Reliant on UE support                             Pertinent Vitals/Pain Pain Assessment: Faces Faces Pain Scale: Hurts little more Pain Location: Back Pain Descriptors / Indicators: Grimacing;Guarding;Moaning Pain Intervention(s): Monitored during session;Limited activity within patient's tolerance    Home Living Family/patient expects to be discharged to:: Assisted living               Home Equipment: Walker - 4 wheels;Grab bars - tub/shower;Grab bars - toilet;Shower seat;Hand held shower head;Cane - single point Additional Comments: [facility] resident at [facility]; lives alone in apartment    Prior Function Level of Independence: Needs assistance   Gait / Transfers Assistance Needed: Indep without DME in apartment. Uses rollator for ambulation outside or to dining hall; participates in walking group 3x/wk  ADL's / Homemaking Assistance  Needed: Mod indep for basic ADLs; has had 2 falls in past 3 months        Hand Dominance        Extremity/Trunk Assessment   Upper Extremity Assessment Upper Extremity Assessment: Generalized weakness    Lower Extremity Assessment Lower Extremity Assessment: Generalized weakness    Cervical / Trunk Assessment Cervical / Trunk Assessment: Other exceptions Cervical / Trunk Exceptions: L2 compression fx (likely chronic), painful back  Communication      Cognition Arousal/Alertness: Awake/alert Behavior During Therapy: WFL for tasks assessed/performed Overall Cognitive Status: History of cognitive impairments - at baseline                                 General Comments: Only oriented to name (increased time to state), does not recall birthday; increased time to remember daughter's name who were present. Daughters report this is pt's baseline cognition      General Comments General comments (skin integrity, edema, etc.): All three daughters present during session and supportive    Exercises     Assessment/Plan    PT Assessment Patient needs continued PT services  PT Problem List Decreased strength;Decreased activity tolerance;Decreased balance;Decreased mobility;Decreased cognition;Decreased knowledge of use of DME;Decreased safety awareness;Decreased knowledge of precautions;Pain       PT Treatment Interventions DME instruction;Gait training;Stair training;Functional mobility training;Therapeutic activities;Therapeutic exercise;Balance training;Patient/family education    PT Goals (Current goals can be found in the Care Plan section)  Acute Rehab PT Goals Patient Stated Goal: Daughters unsure if SNF (worry about this change with pt's dementia) versus return to ILF with them/hired aides to provide 24/7 support PT Goal Formulation: With family Time For Goal Achievement: 12/11/18 Potential to Achieve Goals: Good    Frequency Min 3X/week   Barriers to  discharge        Co-evaluation               AM-PAC PT "6 Clicks" Mobility  Outcome Measure Help needed turning from your back to your side while in a flat bed without using bedrails?: A Little Help needed moving from lying on your back to sitting on the side of a flat bed without using bedrails?: A Little Help needed moving to and from a bed to a chair (including a wheelchair)?: A Little Help needed standing up from a chair using your arms (e.g., wheelchair or bedside chair)?: A Little Help needed to walk in hospital room?: A Little Help needed climbing 3-5 steps with a railing? : A Lot 6 Click Score: 17    End of Session Equipment Utilized During Treatment: Gait belt Activity Tolerance: Patient tolerated treatment well Patient left: in bed;with call bell/phone within reach;with bed alarm set;with family/visitor present Nurse Communication: Mobility status PT Visit Diagnosis: Other abnormalities of gait and mobility (R26.89);Repeated falls (R29.6);Pain Pain - part of body: (Back)    Time: 3557-3220 PT Time Calculation (min) (ACUTE ONLY): 36 min   Charges:   PT Evaluation $PT Eval Moderate Complexity: 1 Mod PT Treatments $Therapeutic Activity: 8-22 mins      Mabeline Caras, PT, DPT Acute Rehabilitation Services  Pager [phone] Office [place]  11/27/2018, 5:51 PM

## 2018-11-27 NOTE — Telephone Encounter (Addendum)
Patient's daughter returned call and expressed her dissatisfaction that no one called her before 5 pm yesterday. Patient is now in the hospital (transported by EMS) with a fracture in her back. [name] stated she informed Rodena Piety that her mother was screaming in pain yesterday and she thought that would of let us know that she needed a prompt reply.   I apologized to [facility] and informed her of the timeline of Rodena Piety transferring call to provider and the time response was sent back from the provider. [name] states something needs to be done about this process. [name] states she also called around 5:05 pm and spoke with the on-call provider and she was of no help. [name] states she asked the on-call doctor to find out what Janett Billow had done and was not give a suffice response. [name] would like for management to know of this situation and follow up with her. [name] aware our Engineer, building services is out of office today and will return Monday 11/30/18.  Janett Billow was verbally informed of this interaction

## 2018-11-28 DIAGNOSIS — S32020A Wedge compression fracture of second lumbar vertebra, initial encounter for closed fracture: Secondary | ICD-10-CM

## 2018-11-28 DIAGNOSIS — I10 Essential (primary) hypertension: Secondary | ICD-10-CM

## 2018-11-28 DIAGNOSIS — I4819 Other persistent atrial fibrillation: Secondary | ICD-10-CM

## 2018-11-28 DIAGNOSIS — M25552 Pain in left hip: Secondary | ICD-10-CM

## 2018-11-28 DIAGNOSIS — F0150 Vascular dementia without behavioral disturbance: Secondary | ICD-10-CM

## 2018-11-28 DIAGNOSIS — E876 Hypokalemia: Secondary | ICD-10-CM

## 2018-11-28 LAB — POTASSIUM: Potassium: 3.6 mmol/L (ref 3.5–5.1)

## 2018-11-28 MED ORDER — GABAPENTIN 100 MG PO CAPS
100.0000 mg | ORAL_CAPSULE | Freq: Three times a day (TID) | ORAL | Status: DC
  Administered 2018-11-28 – 2018-11-29 (×4): 100 mg via ORAL
  Filled 2018-11-28 (×4): qty 1

## 2018-11-28 NOTE — Progress Notes (Signed)
PROGRESS NOTE    Brittney Gutierrez  JOI:[id number] DOB: [birth year] DOA: 11/26/2018 PCP: Gayland Curry, DO   Brief Narrative:  Brittney Gutierrez a 84 y.o.female with past medical history significant for atrial fibrillation on chronic anticoagulation with Eliquis, hypertension, dementia previous TIA, coronary artery disease, diastolic dysfunction, dementia degenerative disc disease and diverticulosis as well as arthritis patient was admitted due to a fall on the day before admission at a facility she lives in an independent living facility.  She had complained of pain in the hip and the lower back after the fall and was seen by her primary care provider, so x-ray was done and it showed some fracture.  She was also said to have been progressively declining with altered mental status 2 to 3 days prior to her fall so when she went to the emergency PCP he ran a urine test because she had had a similar presentation with when she had a urinary tract infection that presented initially with altered mental status but her urinalysis in the emergency room was negative for UTI CT scan of the abdomen and pelvis that was done in the emergency department showed level 2 compression fracture.  She was admitted for pain control and possible kyphoplasty.   Assessment & Plan:   Principal Problem:   Closed compression fracture of L2 lumbar vertebra, initial encounter ([facility]) Active Problems:   Persistent atrial fibrillation   Dementia (HCC)   Hypokalemia   Essential hypertension   SSS (sick sinus syndrome) (HCC)   DDD (degenerative disc disease), lumbar   Left hip pain   Long term current use of anticoagulant   Major depressive disorder with single episode, in partial remission ([facility])   Fall   L2 compression fracture Neurosurgery consulted recommended no surgical intervention and patient is not a candidate for kyphoplasty and does not need any a back brace. Currently waiting for pain control so she can be  discharged.  PT and OT therapy evaluation completed.  Patient was getting confused with IV morphine, associated with restlessness and mild lethargy agitation and trying to get out of bed last night.  Hence IV morphine was discontinued due to high risk for falls and oral gabapentin ordered for better pain control.  Unfortunately patient is not ready for discharge yet due to inadequate pain control.  All to pull falls secondary to deconditioning Patient will require skilled PT on discharge.   Dementia Patient is  at her baseline mental status as per family at bedside.  Resume home dose of Aricept.  Persistent atrial fibrillation Rate controlled. Continue with Eliquis for anticoagulation.  Resume Lopressor for rate control.   Hypokalemia Repeat level within normal limits   Essential hypertension Blood pressure para meters well controlled.   Of depression Resume venlafaxine 75 mg twice daily    DVT prophylaxis: Eliquis Code Status: Partial code Family Communication: Family at bedside disposition Plan: Pending pain control clinical improvement Consultants:   Curbside consult with neurosurgery on call  Procedures: None  antimicrobials: None  Subjective: Patient reports pain on the left side of the back.  Objective: Vitals:   11/28/18 0500 11/28/18 0843 11/28/18 1205 11/28/18 1213  BP:  (!) 133/42 (!) 128/46   Pulse:  (!) 53 (!) 54   Resp:  19 20   Temp:  98.6 F (37 C)  98.3 F (36.8 C)  TempSrc:  Oral  Oral  SpO2:  90% 95%   Weight: 60.4 kg     Height:  Intake/Output Summary (Last 24 hours) at 11/28/2018 1718 Last data filed at 11/28/2018 1621 Gross per 24 hour  Intake 243 ml  Output 1025 ml  Net -782 ml   Filed Weights   11/27/18 0244 11/28/18 0500  Weight: 59.8 kg 60.4 kg    Examination:  General exam: Mild distress from left back pain Respiratory system: Clear to auscultation. Respiratory effort normal. Cardiovascular system: S1 & S2 heard,  RRR. No JVD, Gastrointestinal system: Abdomen is nondistended, soft and nontender. No organomegaly or masses felt. Normal bowel sounds heard. Central nervous system: Alert and oriented to place and person only Extremities: No pedal edema or cyanosis Skin: No rashes, lesions or ulcers Psychiatry: Restless     Data Reviewed: I have personally reviewed following labs and imaging studies  CBC: Recent Labs  Lab 11/26/18 1144 11/26/18 2055 11/27/18 0532  WBC 8.3 9.0 6.4  NEUTROABS 6,499 6.9  --   HGB 11.1* 10.5* 9.8*  HCT 32.6* 32.6* 30.7*  MCV 90.1 95.9 94.5  PLT 332 323 202   Basic Metabolic Panel: Recent Labs  Lab 11/26/18 1144 11/26/18 2055 11/27/18 0532 11/28/18 1359  NA 137 136 138  --   K 3.9 3.3* 3.2* 3.6  CL 101 100 103  --   CO2 24 21* 24  --   GLUCOSE 113 137* 96  --   BUN 28* 24* 19  --   CREATININE 1.55* 1.50* 1.49*  --   CALCIUM 9.5 9.1 8.7*  --    GFR: Estimated Creatinine Clearance: 21.9 mL/min (A) (by C-G formula based on SCr of 1.49 mg/dL (H)). Liver Function Tests: Recent Labs  Lab 11/26/18 2055 11/27/18 0532  AST 27 109*  ALT 21 63*  ALKPHOS 72 121  BILITOT 0.9 1.6*  PROT 7.1 6.2*  ALBUMIN 3.3* 2.8*   Recent Labs  Lab 11/26/18 2055  LIPASE 37   No results for input(s): AMMONIA in the last 168 hours. Coagulation Profile: No results for input(s): INR, PROTIME in the last 168 hours. Cardiac Enzymes: No results for input(s): CKTOTAL, CKMB, CKMBINDEX, TROPONINI in the last 168 hours. BNP (last 3 results) No results for input(s): PROBNP in the last 8760 hours. HbA1C: No results for input(s): HGBA1C in the last 72 hours. CBG: No results for input(s): GLUCAP in the last 168 hours. Lipid Profile: No results for input(s): CHOL, HDL, LDLCALC, TRIG, CHOLHDL, LDLDIRECT in the last 72 hours. Thyroid Function Tests: Recent Labs    11/26/18 1144  TSH 3.99   Anemia Panel: No results for input(s): VITAMINB12, FOLATE, FERRITIN, TIBC, IRON,  RETICCTPCT in the last 72 hours. Sepsis Labs: Recent Labs  Lab 11/26/18 2208  LATICACIDVEN 0.79    Recent Results (from the past 240 hour(s))  Culture, Urine     Status: None   Collection Time: 11/26/18 11:06 AM  Result Value Ref Range Status   MICRO NUMBER: [id number]  Final   SPECIMEN QUALITY: Adequate  Final   Sample Source URINE, CLEAN CATCH  Final   STATUS: FINAL  Final   Result:   Final    Multiple organisms present, each less than 10,000 CFU/mL. These organisms, commonly found on external and internal genitalia, are considered to be colonizers. No further testing performed.  Urine culture     Status: Abnormal (Preliminary result)   Collection Time: 11/26/18 10:24 PM  Result Value Ref Range Status   Specimen Description URINE, CATHETERIZED  Final   Special Requests   Final    NONE Performed at  [facility], [place] [address], [place], [place] [address]    Culture 20,000 COLONIES/mL KLEBSIELLA PNEUMONIAE (A)  Final   Report Status PENDING  Incomplete  MRSA PCR Screening     Status: None   Collection Time: 11/27/18  1:52 AM  Result Value Ref Range Status   MRSA by PCR NEGATIVE NEGATIVE Final    Comment:        The GeneXpert MRSA Assay (FDA approved for NASAL specimens only), is one component of a comprehensive MRSA colonization surveillance program. It is not intended to diagnose MRSA infection nor to guide or monitor treatment for MRSA infections. Performed at [facility], [place] [address], [place], [place] [address]          Radiology Studies: Mr Lumbar Spine Wo Contrast  Result Date: 11/27/2018 CLINICAL DATA:  84 year old female status post fall with age indeterminate mild L2 superior endplate compression fracture on recent CT. EXAM: MRI LUMBAR SPINE WITHOUT CONTRAST TECHNIQUE: Multiplanar, multisequence MR imaging of the lumbar spine was performed. No intravenous contrast was administered. COMPARISON:  CT Abdomen and Pelvis 11/26/2018. Lumbar  radiographs 11/20/2017. FINDINGS: Segmentation:  Normal on the comparison CT. Alignment: Mild dextroconvex lumbar scoliosis. Stable straightening of lordosis from the CT yesterday. Mild grade 1 anterolisthesis at L2-L3 and L3-L4. Vertebrae: Chronic degenerative throughout the lumbar spine but endplate marrow changes but no marrow edema or evidence of acute osseous abnormality. The mild L2 superior endplate deformity is chronic (series 7, image 6). Incidental T12 vertebral body hemangioma. Intact visible sacrum and SI joints. Conus medullaris and cauda equina: Conus extends to the L1 level. No lower spinal cord or conus signal abnormality. Paraspinal and other soft tissues: Negative visible abdominal viscera. Diverticulosis of the large bowel in the pelvis. Partially visible urinary bladder distension. Disc levels: No lower thoracic spinal stenosis. L1-L2: Mild to moderate posterior element hypertrophy without stenosis. L2-L3: Disc space loss and mild anterolisthesis. Circumferential disc bulge and moderate posterior element hypertrophy. Mild spinal and bilateral foraminal stenosis. L3-L4: Disc space loss with vacuum disc. Subtle anterolisthesis. Circumferential disc osteophyte complex and moderate to severe posterior element hypertrophy. Severe spinal and right lateral recess stenosis (right L4 nerve level). Severe left L3 foraminal stenosis. L4-L5: Disc space loss with circumferential disc bulge and superimposed midline caudal disc extrusion (series 8, image 29). Moderate to severe posterior element hypertrophy. Moderate to severe spinal stenosis and right greater than left lateral recess stenosis (L5 nerve levels). Mild right L4 foraminal stenosis. L5-S1: Disc space loss with circumferential disc osteophyte complex and mild to moderate posterior element hypertrophy. Mild right lateral recess stenosis. Moderate bilateral L5 foraminal stenosis. IMPRESSION: 1. No acute osseous abnormality. The mild L2 superior  endplate deformity is chronic. 2. Advanced lumbar spine degeneration except at L1-L2. Up to severe multifactorial spinal and lateral recess stenosis at L3-L4 and L4-L5. Severe left L3 and moderate bilateral L5 neural foraminal stenosis. Electronically Signed   By: Genevie Ann M.D.   On: 11/27/2018 15:08   Ct Abdomen Pelvis W Contrast  Result Date: 11/26/2018 CLINICAL DATA:  Fall.  Confusion. EXAM: CT ABDOMEN AND PELVIS WITH CONTRAST TECHNIQUE: Multidetector CT imaging of the abdomen and pelvis was performed using the standard protocol following bolus administration of intravenous contrast. CONTRAST:  79mL OMNIPAQUE IOHEXOL 300 MG/ML  SOLN COMPARISON:  Lumbar spine x-rays dated January 21, 2017. FINDINGS: Lower chest: Cardiomegaly. Bibasilar subsegmental atelectasis and mild interlobular septal thickening. Trace left pleural effusion. Hepatobiliary: Tiny calcified granulomas in the right hepatic  lobe. Status post cholecystectomy. Mild intra and extrahepatic biliary dilatation, likely related to post cholecystectomy state. Pancreas: Mild main pancreatic duct dilatation, measuring 4 mm. No mass or surrounding inflammatory changes. Spleen: Normal in size without focal abnormality. Adrenals/Urinary Tract: The right adrenal gland is unremarkable. Mild left adrenal thickening without focal nodule. Moderately atrophic right kidney delayed enhancement and excretion of contrast. Subcentimeter low-density lesions in both kidneys are too small to characterize. No renal or ureteral calculi. No hydronephrosis. Bladder is unremarkable. Stomach/Bowel: Small hiatal hernia. The stomach is otherwise within normal limits. No bowel wall thickening, distention, or surrounding inflammatory changes. Extensive distal descending and sigmoid colonic diverticulosis. The appendix is not identified, but there are no signs of inflammation at the base of the cecum. Vascular/Lymphatic: Aortoiliac atherosclerotic vascular disease. No aneurysm.  Severe stenosis of the right renal artery origin. No enlarged abdominal or pelvic lymph nodes. Reproductive: Status post hysterectomy. No adnexal masses. Other: No abdominal wall hernia or abnormality. No abdominopelvic ascites. No pneumoperitoneum. Musculoskeletal: Age-indeterminate mild L2 superior endplate compression deformity, new since February 2018. Moderate to severe lumbar spondylosis. Moderate to severe right and mild left hip osteoarthritis. IMPRESSION: 1.  No acute intra-abdominal process. 2. Age-indeterminate mild L2 superior endplate compression deformity, new since February 2018. Correlate with point tenderness. 3. Mild interstitial pulmonary edema.  Trace left pleural effusion. 4. Moderate right renal atrophy likely related to longstanding hypoperfusion given severe right renal artery origin stenosis. 5.  Aortic atherosclerosis (ICD10-I70.0). Electronically Signed   By: Titus Dubin M.D.   On: 11/26/2018 23:29        Scheduled Meds: . amLODipine  10 mg Oral Daily  . apixaban  2.5 mg Oral BID  . cholecalciferol  2,000 Units Oral Daily  . docusate sodium  100 mg Oral QHS  . donepezil  10 mg Oral QHS  . furosemide  40 mg Oral Daily  . gabapentin  100 mg Oral TID  . losartan  25 mg Oral Daily  . metoprolol tartrate  50 mg Oral BID  . sodium chloride flush  3 mL Intravenous Q12H  . venlafaxine  75 mg Oral BID   Continuous Infusions: . sodium chloride       LOS: 1 day    Time spent: 38 minutes    Hosie Poisson, MD [facility] Pager [phone]  If 7PM-7AM, please contact night-coverage [email or web address] Password [facility] 11/28/2018, 5:18 PM

## 2018-11-28 NOTE — Plan of Care (Signed)

## 2018-11-29 LAB — URINE CULTURE: Culture: 20000 — AB

## 2018-11-29 MED ORDER — GABAPENTIN 100 MG PO CAPS: 100.0000 mg | ORAL_CAPSULE | Freq: Three times a day (TID) | ORAL | 0 refills | Status: DC

## 2018-11-29 MED ORDER — METOPROLOL TARTRATE 50 MG PO TABS: 50.0000 mg | ORAL_TABLET | Freq: Every day | ORAL | 1 refills | Status: DC

## 2018-11-29 MED ORDER — CEPHALEXIN 250 MG PO CAPS: 250.0000 mg | ORAL_CAPSULE | Freq: Two times a day (BID) | ORAL | 0 refills | Status: AC

## 2018-11-29 MED ORDER — ALUM & MAG HYDROXIDE-SIMETH 200-200-20 MG/5ML PO SUSP
30.0000 mL | Freq: Once | ORAL | Status: AC
  Administered 2018-11-29: 30 mL via ORAL
  Filled 2018-11-29: qty 30

## 2018-11-29 NOTE — Progress Notes (Signed)
Spoke w patient, daughter (on speaker phone) and son in room. They state they have arranged 24 our supervision through [employer]. They would like [facility] as housed at [facility]. Patient lives in [place] side. Will adjust HH order to state outpatient and fax to [facility]. Family confirms no DME needs. No other CM needs.

## 2018-11-29 NOTE — Progress Notes (Signed)
CSW spoke with patient and daughter, [name], via bedside- family is wanting patient to return home at this time. Patient is a current resident at [facility]. Family plans on hiring 24 hour care for the next few weeks to provide more support at [facility].   [facility] uses [employer]- CSW will notify RN CM. MD updated as well.   Kingsley Spittle, [place]  [phone]

## 2018-11-29 NOTE — Discharge Summary (Addendum)
Physician Discharge Summary  Brittney Gutierrez JGO:[id number] DOB: 01-12-1927 DOA: 11/26/2018  PCP: Gayland Curry, DO  Admit date: 11/26/2018 Discharge date: 11/29/2018  Admitted From: ILF Disposition: ILF  Recommendations for Outpatient Follow-up:  1. Follow up with PCP in 1-2 weeks 2. Please obtain BMP/CBC in one week 3. Please follow up  With cardiology in 1 to 2 weeks.  4. We have decreased the dose of metoprolol to daily , please follow up with cardiology in 1 to 2 weeks.   Home Health:yes  Discharge Condition:guarded.  CODE STATUS:,partial code.  Diet recommendation: Heart Healthy  Brief/Interim Summary: Brittney Gutierrez a 84 y.o.femalewith past medical history significant for atrial fibrillation on chronic anticoagulation with Eliquis, hypertension, dementia previous TIA, coronary artery disease, diastolic dysfunction, dementia degenerative disc disease and diverticulosis as well as arthritis patient was admitted due to a fall on the day before admission at a facility she lives in an independent living facility. She had complained of pain in the hip and the lower back after the fall and was seen by her primary care provider, so x-ray was done and it showed some fracture. She was also said to have been progressively declining with altered mental status 2 to 3 days prior to her fall so when she went to the emergency PCP he ran a urine test because she had had a similar presentation with when she had a urinary tract infection that presented initially with altered mental status but her urinalysis in the emergency room was negative for UTI CT scan of the abdomen and pelvis that was done in the emergency department showed level 2 compression fracture. She was admitted for pain control and possible kyphoplasty.    Discharge Diagnoses:  Principal Problem:   Closed compression fracture of L2 lumbar vertebra, initial encounter ([place]) Active Problems:   Persistent atrial  fibrillation   Dementia (HCC)   Hypokalemia   Essential hypertension   SSS (sick sinus syndrome) (HCC)   DDD (degenerative disc disease), lumbar   Left hip pain   Long term current use of anticoagulant   Major depressive disorder with single episode, in partial remission ([place])   Fall  L2 compression fracture Neurosurgery consulted recommended no surgical intervention and patient is not a candidate for kyphoplasty and does not need any a back brace. Currently waiting for pain control so she can be discharged.  PT and OT therapy evaluation completed.  Patient was getting confused with IV morphine, associated with restlessness and mild lethargy agitation and trying to get out of bed last night.  Hence IV morphine was discontinued due to high risk for falls and oral gabapentin ordered for better pain control. Pain better this morning. And will d/c her back to ILF with PT and OT .    Frequent falls secondary to deconditioning Patient will require PT on discharge.   Dementia Patient is  at her baseline mental status as per family at bedside.  Resume home dose of Aricept.  Persistent atrial fibrillation Rate controlled. Continue with Eliquis for anticoagulation.  Resume Lopressor for rate control.   Hypokalemia Repeat level within normal limits   Essential hypertension Blood pressure para meters well controlled.   Of depression Resume venlafaxine 75 mg twice daily    Klebsiella UTI: Discharged her on oral keflex for 5 days.   Discharge Instructions  Discharge Instructions    Diet - low sodium heart healthy   Complete by:  As directed    Increase activity slowly   Complete by:  As directed      Allergies as of 11/29/2018      Reactions   Tape Rash, Other (See Comments)   TEARS THE SKIN!!   Bactrim [sulfamethoxazole-trimethoprim] Nausea Only   Amiodarone Itching   Amoxicillin Other (See Comments)   Unknown- ? Upset stomach   Demerol [meperidine] Other  (See Comments)   Hallucinations   Hydralazine Other (See Comments)   Tingling and chest pain    Lisinopril Cough   Zoloft [sertraline Hcl] Other (See Comments)   Unknown      Medication List    TAKE these medications   acetaminophen 325 MG tablet Commonly known as:  TYLENOL Take 650 mg by mouth every 6 (six) hours as needed for mild pain.   amLODipine 10 MG tablet Commonly known as:  NORVASC Take 1 tablet (10 mg total) by mouth daily.   cephALEXin 250 MG capsule Commonly known as:  KEFLEX Take 1 capsule (250 mg total) by mouth 2 (two) times daily for 5 days.   docusate sodium 100 MG capsule Commonly known as:  COLACE Take 100 mg by mouth at bedtime.   donepezil 10 MG tablet Commonly known as:  ARICEPT Take 1 tablet (10 mg total) by mouth daily. What changed:  when to take this   ELIQUIS 2.5 MG Tabs tablet Generic drug:  apixaban TAKE 1 TABLET TWICE DAILY What changed:  how much to take   furosemide 40 MG tablet Commonly known as:  LASIX Take 1-2 tablets (40-80 mg total) by mouth daily as directed. What changed:    how much to take  how to take this  when to take this  additional instructions   gabapentin 100 MG capsule Commonly known as:  NEURONTIN Take 1 capsule (100 mg total) by mouth 3 (three) times daily.   losartan 25 MG tablet Commonly known as:  COZAAR TAKE 1 TABLET EVERY DAY   metoprolol tartrate 50 MG tablet Commonly known as:  LOPRESSOR Take 1 tablet (50 mg total) by mouth daily. What changed:  when to take this   traMADol 50 MG tablet Commonly known as:  ULTRAM Take 0.5-1 tablets (25-50 mg total) by mouth every 6 (six) hours as needed. What changed:  reasons to take this   UNABLE TO FIND Lift Chair due to proximal muscle weakness, frailty   venlafaxine 75 MG tablet Commonly known as:  EFFEXOR Take 1 tablet (75 mg total) 2 (two) times daily by mouth.   Vitamin D3 50 MCG (2000 UT) Tabs Take 2,000 Units by mouth daily.       Follow-up Information    Reed, Tiffany L, DO. Schedule an appointment as soon as possible for a visit in 1 week(s).   Specialty:  Geriatric Medicine Contact information: [place] [place] [address] [phone]        Sanda Klein, MD .   Specialty:  Cardiology Contact information: [address] [place] [address] [phone]          Allergies  Allergen Reactions  . Tape Rash and Other (See Comments)    TEARS THE SKIN!!  . Bactrim [Sulfamethoxazole-Trimethoprim] Nausea Only  . Amiodarone Itching  . Amoxicillin Other (See Comments)    Unknown- ? Upset stomach  . Demerol [Meperidine] Other (See Comments)    Hallucinations  . Hydralazine Other (See Comments)    Tingling and chest pain   . Lisinopril Cough  . Zoloft [Sertraline Hcl] Other (See Comments)    Unknown  Consultations:  Curbside consult with neuro surgery.    Procedures/Studies: Mr Lumbar Spine Wo Contrast  Result Date: 11/27/2018 CLINICAL DATA:  84 year old female status post fall with age indeterminate mild L2 superior endplate compression fracture on recent CT. EXAM: MRI LUMBAR SPINE WITHOUT CONTRAST TECHNIQUE: Multiplanar, multisequence MR imaging of the lumbar spine was performed. No intravenous contrast was administered. COMPARISON:  CT Abdomen and Pelvis 11/26/2018. Lumbar radiographs 11/20/2017. FINDINGS: Segmentation:  Normal on the comparison CT. Alignment: Mild dextroconvex lumbar scoliosis. Stable straightening of lordosis from the CT yesterday. Mild grade 1 anterolisthesis at L2-L3 and L3-L4. Vertebrae: Chronic degenerative throughout the lumbar spine but endplate marrow changes but no marrow edema or evidence of acute osseous abnormality. The mild L2 superior endplate deformity is chronic (series 7, image 6). Incidental T12 vertebral body hemangioma. Intact visible sacrum and SI joints. Conus medullaris and cauda equina: Conus extends to the L1 level. No lower  spinal cord or conus signal abnormality. Paraspinal and other soft tissues: Negative visible abdominal viscera. Diverticulosis of the large bowel in the pelvis. Partially visible urinary bladder distension. Disc levels: No lower thoracic spinal stenosis. L1-L2: Mild to moderate posterior element hypertrophy without stenosis. L2-L3: Disc space loss and mild anterolisthesis. Circumferential disc bulge and moderate posterior element hypertrophy. Mild spinal and bilateral foraminal stenosis. L3-L4: Disc space loss with vacuum disc. Subtle anterolisthesis. Circumferential disc osteophyte complex and moderate to severe posterior element hypertrophy. Severe spinal and right lateral recess stenosis (right L4 nerve level). Severe left L3 foraminal stenosis. L4-L5: Disc space loss with circumferential disc bulge and superimposed midline caudal disc extrusion (series 8, image 29). Moderate to severe posterior element hypertrophy. Moderate to severe spinal stenosis and right greater than left lateral recess stenosis (L5 nerve levels). Mild right L4 foraminal stenosis. L5-S1: Disc space loss with circumferential disc osteophyte complex and mild to moderate posterior element hypertrophy. Mild right lateral recess stenosis. Moderate bilateral L5 foraminal stenosis. IMPRESSION: 1. No acute osseous abnormality. The mild L2 superior endplate deformity is chronic. 2. Advanced lumbar spine degeneration except at L1-L2. Up to severe multifactorial spinal and lateral recess stenosis at L3-L4 and L4-L5. Severe left L3 and moderate bilateral L5 neural foraminal stenosis. Electronically Signed   By: Genevie Ann M.D.   On: 11/27/2018 15:08   Ct Abdomen Pelvis W Contrast  Result Date: 11/26/2018 CLINICAL DATA:  Fall.  Confusion. EXAM: CT ABDOMEN AND PELVIS WITH CONTRAST TECHNIQUE: Multidetector CT imaging of the abdomen and pelvis was performed using the standard protocol following bolus administration of intravenous contrast. CONTRAST:   33mL OMNIPAQUE IOHEXOL 300 MG/ML  SOLN COMPARISON:  Lumbar spine x-rays dated January 21, 2017. FINDINGS: Lower chest: Cardiomegaly. Bibasilar subsegmental atelectasis and mild interlobular septal thickening. Trace left pleural effusion. Hepatobiliary: Tiny calcified granulomas in the right hepatic lobe. Status post cholecystectomy. Mild intra and extrahepatic biliary dilatation, likely related to post cholecystectomy state. Pancreas: Mild main pancreatic duct dilatation, measuring 4 mm. No mass or surrounding inflammatory changes. Spleen: Normal in size without focal abnormality. Adrenals/Urinary Tract: The right adrenal gland is unremarkable. Mild left adrenal thickening without focal nodule. Moderately atrophic right kidney delayed enhancement and excretion of contrast. Subcentimeter low-density lesions in both kidneys are too small to characterize. No renal or ureteral calculi. No hydronephrosis. Bladder is unremarkable. Stomach/Bowel: Small hiatal hernia. The stomach is otherwise within normal limits. No bowel wall thickening, distention, or surrounding inflammatory changes. Extensive distal descending and sigmoid colonic diverticulosis. The appendix is not identified, but there are no signs of  inflammation at the base of the cecum. Vascular/Lymphatic: Aortoiliac atherosclerotic vascular disease. No aneurysm. Severe stenosis of the right renal artery origin. No enlarged abdominal or pelvic lymph nodes. Reproductive: Status post hysterectomy. No adnexal masses. Other: No abdominal wall hernia or abnormality. No abdominopelvic ascites. No pneumoperitoneum. Musculoskeletal: Age-indeterminate mild L2 superior endplate compression deformity, new since February 2018. Moderate to severe lumbar spondylosis. Moderate to severe right and mild left hip osteoarthritis. IMPRESSION: 1.  No acute intra-abdominal process. 2. Age-indeterminate mild L2 superior endplate compression deformity, new since February 2018. Correlate  with point tenderness. 3. Mild interstitial pulmonary edema.  Trace left pleural effusion. 4. Moderate right renal atrophy likely related to longstanding hypoperfusion given severe right renal artery origin stenosis. 5.  Aortic atherosclerosis (ICD10-I70.0). Electronically Signed   By: Titus Dubin M.D.   On: 11/26/2018 23:29   Dg Hip Unilat With Pelvis 2-3 Views Left  Result Date: 11/26/2018 CLINICAL DATA:  Left hip pain. EXAM: DG HIP (WITH OR WITHOUT PELVIS) 2-3V LEFT COMPARISON:  No prior. FINDINGS: Pelvic calcifications consistent phleboliths. Calcifications noted the buttock consistent with injection granulomas. No acute bony abnormality identified. No evidence of fracture or dislocation. Peripheral vascular calcification. IMPRESSION: 1.  Degenerative changes left hip. 2.  No acute bony or joint abnormality. 3.  Peripheral vascular disease. Electronically Signed   By: Marcello Moores  Register   On: 11/26/2018 17:22     Subjective: Pain well controlled.   Discharge Exam: Vitals:   11/29/18 0909 11/29/18 1218  BP: (!) 136/44 (!) 131/43  Pulse:  (!) 51  Resp: 18 18  Temp:  98.3 F (36.8 C)  SpO2:  90%   Vitals:   11/28/18 2030 11/29/18 0559 11/29/18 0909 11/29/18 1218  BP: (!) 126/39 (!) 125/45 (!) 136/44 (!) 131/43  Pulse: (!) 55 (!) 51  (!) 51  Resp: 18 18 18 18   Temp: 98.2 F (36.8 C) 98.1 F (36.7 C)  98.3 F (36.8 C)  TempSrc: Oral Oral  Oral  SpO2: 90% 90%  90%  Weight:  60 kg    Height:        General: Pt is alert, awake, not in acute distress Cardiovascular: RRR, S1/S2 +, no rubs, no gallops Respiratory: CTA bilaterally, no wheezing, no rhonchi Abdominal: Soft, NT, ND, bowel sounds + Extremities: no edema, no cyanosis    The results of significant diagnostics from this hospitalization (including imaging, microbiology, ancillary and laboratory) are listed below for reference.     Microbiology: Recent Results (from the past 240 hour(s))  Culture, Urine     Status:  None   Collection Time: 11/26/18 11:06 AM  Result Value Ref Range Status   MICRO NUMBER: [id number]  Final   SPECIMEN QUALITY: Adequate  Final   Sample Source URINE, CLEAN CATCH  Final   STATUS: FINAL  Final   Result:   Final    Multiple organisms present, each less than 10,000 CFU/mL. These organisms, commonly found on external and internal genitalia, are considered to be colonizers. No further testing performed.  Urine culture     Status: Abnormal   Collection Time: 11/26/18 10:24 PM  Result Value Ref Range Status   Specimen Description URINE, CATHETERIZED  Final   Special Requests   Final    NONE Performed at [facility], [address], [place], [place] [address]    Culture 20,000 COLONIES/mL KLEBSIELLA PNEUMONIAE (A)  Final   Report Status 11/29/2018 FINAL  Final   Organism ID, Bacteria KLEBSIELLA PNEUMONIAE (  A)  Final      Susceptibility   Klebsiella pneumoniae - MIC*    AMPICILLIN RESISTANT Resistant     CEFAZOLIN <=4 SENSITIVE Sensitive     CEFTRIAXONE <=1 SENSITIVE Sensitive     CIPROFLOXACIN <=0.25 SENSITIVE Sensitive     GENTAMICIN <=1 SENSITIVE Sensitive     IMIPENEM <=0.25 SENSITIVE Sensitive     NITROFURANTOIN <=16 SENSITIVE Sensitive     TRIMETH/SULFA <=20 SENSITIVE Sensitive     AMPICILLIN/SULBACTAM <=2 SENSITIVE Sensitive     PIP/TAZO <=4 SENSITIVE Sensitive     Extended ESBL NEGATIVE Sensitive     * 20,000 COLONIES/mL KLEBSIELLA PNEUMONIAE  MRSA PCR Screening     Status: None   Collection Time: 11/27/18  1:52 AM  Result Value Ref Range Status   MRSA by PCR NEGATIVE NEGATIVE Final    Comment:        The GeneXpert MRSA Assay (FDA approved for NASAL specimens only), is one component of a comprehensive MRSA colonization surveillance program. It is not intended to diagnose MRSA infection nor to guide or monitor treatment for MRSA infections. Performed at [facility], [place] [address], [place], [place] [address]      Labs: BNP (last 3  results) Recent Labs    12/13/17 1607  BNP 0,712.1*   Basic Metabolic Panel: Recent Labs  Lab 11/26/18 1144 11/26/18 2055 11/27/18 0532 11/28/18 1359  NA 137 136 138  --   K 3.9 3.3* 3.2* 3.6  CL 101 100 103  --   CO2 24 21* 24  --   GLUCOSE 113 137* 96  --   BUN 28* 24* 19  --   CREATININE 1.55* 1.50* 1.49*  --   CALCIUM 9.5 9.1 8.7*  --    Liver Function Tests: Recent Labs  Lab 11/26/18 2055 11/27/18 0532  AST 27 109*  ALT 21 63*  ALKPHOS 72 121  BILITOT 0.9 1.6*  PROT 7.1 6.2*  ALBUMIN 3.3* 2.8*   Recent Labs  Lab 11/26/18 2055  LIPASE 37   No results for input(s): AMMONIA in the last 168 hours. CBC: Recent Labs  Lab 11/26/18 1144 11/26/18 2055 11/27/18 0532  WBC 8.3 9.0 6.4  NEUTROABS 6,499 6.9  --   HGB 11.1* 10.5* 9.8*  HCT 32.6* 32.6* 30.7*  MCV 90.1 95.9 94.5  PLT 332 323 273   Cardiac Enzymes: No results for input(s): CKTOTAL, CKMB, CKMBINDEX, TROPONINI in the last 168 hours. BNP: Invalid input(s): POCBNP CBG: No results for input(s): GLUCAP in the last 168 hours. D-Dimer No results for input(s): DDIMER in the last 72 hours. Hgb A1c No results for input(s): HGBA1C in the last 72 hours. Lipid Profile No results for input(s): CHOL, HDL, LDLCALC, TRIG, CHOLHDL, LDLDIRECT in the last 72 hours. Thyroid function studies No results for input(s): TSH, T4TOTAL, T3FREE, THYROIDAB in the last 72 hours.  Invalid input(s): FREET3 Anemia work up No results for input(s): VITAMINB12, FOLATE, FERRITIN, TIBC, IRON, RETICCTPCT in the last 72 hours. Urinalysis    Component Value Date/Time   COLORURINE YELLOW 11/26/2018 2237   APPEARANCEUR CLEAR 11/26/2018 2237   LABSPEC 1.010 11/26/2018 2237   PHURINE 7.0 11/26/2018 2237   GLUCOSEU NEGATIVE 11/26/2018 2237   HGBUR MODERATE (A) 11/26/2018 2237   BILIRUBINUR NEGATIVE 11/26/2018 2237   BILIRUBINUR Neg 11/26/2018 [place] 11/26/2018 2237   PROTEINUR NEGATIVE 11/26/2018 2237    UROBILINOGEN 0.2 11/26/2018 1123   NITRITE NEGATIVE 11/26/2018 2237   LEUKOCYTESUR  NEGATIVE 11/26/2018 2237   Sepsis Labs Invalid input(s): PROCALCITONIN,  WBC,  LACTICIDVEN Microbiology Recent Results (from the past 240 hour(s))  Culture, Urine     Status: None   Collection Time: 11/26/18 11:06 AM  Result Value Ref Range Status   MICRO NUMBER: [id number]  Final   SPECIMEN QUALITY: Adequate  Final   Sample Source URINE, CLEAN CATCH  Final   STATUS: FINAL  Final   Result:   Final    Multiple organisms present, each less than 10,000 CFU/mL. These organisms, commonly found on external and internal genitalia, are considered to be colonizers. No further testing performed.  Urine culture     Status: Abnormal   Collection Time: 11/26/18 10:24 PM  Result Value Ref Range Status   Specimen Description URINE, CATHETERIZED  Final   Special Requests   Final    NONE Performed at [facility], [address], [place], [place] [address]    Culture 20,000 COLONIES/mL KLEBSIELLA PNEUMONIAE (A)  Final   Report Status 11/29/2018 FINAL  Final   Organism ID, Bacteria KLEBSIELLA PNEUMONIAE (A)  Final      Susceptibility   Klebsiella pneumoniae - MIC*    AMPICILLIN RESISTANT Resistant     CEFAZOLIN <=4 SENSITIVE Sensitive     CEFTRIAXONE <=1 SENSITIVE Sensitive     CIPROFLOXACIN <=0.25 SENSITIVE Sensitive     GENTAMICIN <=1 SENSITIVE Sensitive     IMIPENEM <=0.25 SENSITIVE Sensitive     NITROFURANTOIN <=16 SENSITIVE Sensitive     TRIMETH/SULFA <=20 SENSITIVE Sensitive     AMPICILLIN/SULBACTAM <=2 SENSITIVE Sensitive     PIP/TAZO <=4 SENSITIVE Sensitive     Extended ESBL NEGATIVE Sensitive     * 20,000 COLONIES/mL KLEBSIELLA PNEUMONIAE  MRSA PCR Screening     Status: None   Collection Time: 11/27/18  1:52 AM  Result Value Ref Range Status   MRSA by PCR NEGATIVE NEGATIVE Final    Comment:        The GeneXpert MRSA Assay (FDA approved for NASAL specimens only), is one component of  a comprehensive MRSA colonization surveillance program. It is not intended to diagnose MRSA infection nor to guide or monitor treatment for MRSA infections. Performed at [facility], [place] [address] [place], [place] [address]      Time coordinating discharge:36  minutes  SIGNED:   Hosie Poisson, MD  [facility] 11/29/2018, 12:23 PM Pager   If 7PM-7AM, please contact night-coverage [email or web address] Password [email or web address]

## 2018-11-30 ENCOUNTER — Ambulatory Visit: Payer: Medicare Other

## 2018-11-30 ENCOUNTER — Telehealth: Payer: Self-pay

## 2018-11-30 ENCOUNTER — Ambulatory Visit (INDEPENDENT_AMBULATORY_CARE_PROVIDER_SITE_OTHER): Payer: Medicare Other | Admitting: Internal Medicine

## 2018-11-30 ENCOUNTER — Encounter: Payer: Self-pay | Admitting: Internal Medicine

## 2018-11-30 VITALS — BP 122/60 | HR 52 | Ht 61.0 in | Wt 134.0 lb

## 2018-11-30 DIAGNOSIS — S32020A Wedge compression fracture of second lumbar vertebra, initial encounter for closed fracture: Secondary | ICD-10-CM

## 2018-11-30 DIAGNOSIS — M5416 Radiculopathy, lumbar region: Secondary | ICD-10-CM | POA: Diagnosis not present

## 2018-11-30 DIAGNOSIS — M8000XA Age-related osteoporosis with current pathological fracture, unspecified site, initial encounter for fracture: Secondary | ICD-10-CM

## 2018-11-30 DIAGNOSIS — R42 Dizziness and giddiness: Secondary | ICD-10-CM | POA: Diagnosis not present

## 2018-11-30 DIAGNOSIS — I4819 Other persistent atrial fibrillation: Secondary | ICD-10-CM | POA: Diagnosis not present

## 2018-11-30 DIAGNOSIS — M48061 Spinal stenosis, lumbar region without neurogenic claudication: Secondary | ICD-10-CM

## 2018-11-30 MED ORDER — METOPROLOL TARTRATE 25 MG PO TABS: 25.0000 mg | ORAL_TABLET | Freq: Two times a day (BID) | ORAL | Status: AC

## 2018-11-30 MED ORDER — DENOSUMAB 60 MG/ML ~~LOC~~ SOSY
60.0000 mg | PREFILLED_SYRINGE | Freq: Once | SUBCUTANEOUS | Status: AC
  Administered 2018-11-30: 60 mg via SUBCUTANEOUS

## 2018-11-30 NOTE — Telephone Encounter (Signed)
Brittney Gutierrez, Engineer, building services verbally informed of interaction this am

## 2018-11-30 NOTE — Patient Instructions (Addendum)
Please use your walker all of the time to prevent falls. Continue gabapentin for nerve pain. You may also use salonpas with lidocaine. You may also use heat but not on top of the patches (can cause burns). Reduce the metoprolol tartrate to 25mg  twice a day rather than 50mg  once a day.

## 2018-11-30 NOTE — Progress Notes (Signed)
Location:  [name] clinic Provider: Chasity Outten L. Mariea Clonts, D.O., C.M.D.  Code Status: "partial code" at hospital Goals of Care:  Advanced Directives 11/27/2018  Does Patient Have a Medical Advance Directive? Yes  Type of Advance Directive Living will  Does patient want to make changes to medical advance directive? No - Patient declined  Copy of [place] in Chart? -  Would patient like information on creating a medical advance directive? -     Chief Complaint  Patient presents with  . Transitions Of Care    discharged 11/29/18    HPI: Patient is a 84 y.o. female with h/o afib, htn, dementia, prior tia, CAD, diastolic dysfunction chf, htn, DDD and diverticulosis, OA seen today for hospital follow-up s/p admission from her stay 12/19-12/22 for fall with back pain.  She'd come here to the clinic and seen by NP for increased confusion from her baseline for 3 days and family felt she had a UTI--she had no fever, chills, abd pain, cough, congestion, dysuria or diarrhea.  She had fallen and had pain in her left groin.  Urine dip was negative.  Left hip and pelvis xrays done and she was given tylenol.  This was not helping and then her children called back saying pain was worse and she was screaming out in pain.  Tramadol was then sent to pharmacy and  Note sent to triage at 4:49pm but pt's daughter not contacted until the next morning.  They had patient taken to ED by then so they were upset.  They spoke with the on call provider who did not know about what had been done.    At the hospital, she was a partial code.  She had a CT abdomen which wound up revealing a compression fracture.  Neurosurgery was consulted and recommended no surgery and was not a candidate for kyphoplasty.  No brace was needed.  Pain was controlled with gabapentin, PT, OT evaluated and [place].  She had morphine IV in the hospital.    Was delirious at appt. At hospital  was found to have compression fracture.   Takes meds at 8am, her stomach feels upset and she wants to lie back down for a while.  Not taking tramadol.  Using gabapentin and tylenol with benefit.   She grew out klebsiella on urine culture so she's completing 5 days of keflex.  Metoprolol tartrate was reduced to 50mg  once a day due to low pulse at the hospital.  She has a f/u with Dr. Loletha Grayer coming up.  She has had a couple of episodes of feeling like she'd pass out and lying down when with her daughter.  Dose was not changed, just frequency at the hospital.    Past Medical History:  Diagnosis Date  . Abnormal weight loss 05/28/2004  . Anxiety 01/17/2003  . Back pain 08/19/2005   with radiculopathy  . Bradycardia, drug induced    Diltiazem  . Cerebral atherosclerosis 09/24/1999  . Chest pain, atypical 04/2016   minor CAD at cath   . CHF (congestive heart failure) ([place])   . Chronic anticoagulation    Eliquis  . Dementia ([place])    "don't know kind or stage" (05/02/2016)  . Depression 09/06/2002  . Diverticulitis 09/28/2007  . External hemorrhoids 04/12/2009  . Female climacteric state 03/04/2000  . 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 GERD (gastroesophageal reflux disease)   .  Hypertensive cardiovascular disease    moderate LVH on echo march 2017  . Macular degeneration   . Malaise and fatigue   . 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 Osteoarthrosis, localized   . Osteoporosis 05/28/2004  . Paranoia ([place])   . Paroxysmal A-fib ([place])   . TIA (transient ischemic attack) 2000s?  [name] Urinary incontinence 08/18/2007  . UTI (urinary tract infection)   . Vaginitis, atrophic   . Vertigo, peripheral 10/07/2000    Past Surgical History:  Procedure Laterality Date  . BRAIN SURGERY  2000   Neurocysticercosis ("parasite")  . CARDIAC CATHETERIZATION N/A 05/03/2016   Procedure: Left Heart Cath and Coronary Angiography;  Surgeon:  Sherren Mocha, MD;  Location: [place] CV LAB;  Service: Cardiovascular;  Laterality: N/A;  . CARDIOVERSION N/A 07/14/2017   Procedure: CARDIOVERSION;  Surgeon: Pixie Casino, MD;  Location: [facility] ENDOSCOPY;  Service: Cardiovascular;  Laterality: N/A;  . CATARACT EXTRACTION  2013  . CATARACT EXTRACTION, BILATERAL     "not sure if both eyes; feel like it probably was"  . JOINT REPLACEMENT    . PERIPHERAL VASCULAR CATHETERIZATION N/A 05/03/2016   Procedure: Abdominal Aortogram;  Surgeon: Sherren Mocha, MD;  Location: [place] CV LAB;  Service: Cardiovascular;  Laterality: N/A;  . SHOULDER ARTHROSCOPY W/ ROTATOR CUFF REPAIR Right X 3  . TEE WITHOUT CARDIOVERSION N/A 07/14/2017   Procedure: TRANSESOPHAGEAL ECHOCARDIOGRAM (TEE);  Surgeon: Pixie Casino, MD;  Location: [place];  Service: Cardiovascular;  Laterality: N/A;  . TOTAL KNEE ARTHROPLASTY Left 1990  . VAGINAL HYSTERECTOMY  1960    Allergies  Allergen Reactions  . Tape Rash and Other (See Comments)    TEARS THE SKIN!!  . Bactrim [Sulfamethoxazole-Trimethoprim] Nausea Only  . Amiodarone Itching  . Amoxicillin Other (See Comments)    Unknown- ? Upset stomach  . Demerol [Meperidine] Other (See Comments)    Hallucinations  . Hydralazine Other (See Comments)    Tingling and chest pain   . Lisinopril Cough  . Zoloft [Sertraline Hcl] Other (See Comments)    Unknown    Outpatient Encounter Medications as of 11/30/2018  Medication Sig  . acetaminophen (TYLENOL) 325 MG tablet Take 650 mg by mouth every 6 (six) hours as needed for mild pain.  [name] amLODipine (NORVASC) 10 MG tablet Take 1 tablet (10 mg total) by mouth daily.  . cephALEXin (KEFLEX) 250 MG capsule Take 1 capsule (250 mg total) by mouth 2 (two) times daily for 5 days.  . Cholecalciferol (VITAMIN D3) 2000 units TABS Take 2,000 Units by mouth daily.   [name] docusate sodium (COLACE) 100 MG capsule Take 100 mg by mouth at bedtime.   . donepezil (ARICEPT) 10 MG tablet Take  1 tablet (10 mg total) by mouth daily.  [name] ELIQUIS 2.5 MG TABS tablet TAKE 1 TABLET TWICE DAILY  . furosemide (LASIX) 40 MG tablet Take 40 mg by mouth daily.  [name] gabapentin (NEURONTIN) 100 MG capsule Take 1 capsule (100 mg total) by mouth 3 (three) times daily.  [name] losartan (COZAAR) 25 MG tablet TAKE 1 TABLET EVERY DAY  . metoprolol tartrate (LOPRESSOR) 50 MG tablet Take 1 tablet (50 mg total) by mouth daily.  [name] venlafaxine (EFFEXOR) 75 MG tablet Take 1 tablet (75 mg total) 2 (two) times daily by mouth.  . [DISCONTINUED] furosemide (LASIX) 40 MG tablet Take 1-2 tablets (40-80 mg total) by mouth daily as directed. (Patient taking differently: Take 40 mg by mouth daily. )  . [  DISCONTINUED] traMADol (ULTRAM) 50 MG tablet Take 0.5-1 tablets (25-50 mg total) by mouth every 6 (six) hours as needed. (Patient taking differently: Take 25-50 mg by mouth every 6 (six) hours as needed (for pain). )  . [DISCONTINUED] UNABLE TO FIND Lift Chair due to proximal muscle weakness, frailty   No facility-administered encounter medications on file as of 11/30/2018.     Review of Systems:  Review of Systems  Constitutional: Negative for chills, fever and malaise/fatigue.  HENT: Negative for congestion.   Eyes: Negative for blurred vision.       Glasses  Respiratory: Negative for cough and shortness of breath.   Cardiovascular: Negative for chest pain, palpitations and leg swelling.  Gastrointestinal: Negative for abdominal pain, blood in stool, constipation, diarrhea and melena.  Genitourinary: Negative for dysuria, flank pain, frequency, hematuria and urgency.  Musculoskeletal: Positive for back pain, falls, joint pain and myalgias. Negative for neck pain.  Skin: Negative for itching and rash.  Neurological: Negative for dizziness and loss of consciousness.  Endo/Heme/Allergies: Bruises/bleeds easily.  Psychiatric/Behavioral: Positive for depression and memory loss. The patient is nervous/anxious. The patient  does not have insomnia.     Health Maintenance  Topic Date Due  . TETANUS/TDAP  09/08/2022  . INFLUENZA VACCINE  Completed  . DEXA SCAN  Completed  . PNA vac Low Risk Adult  Completed    Physical Exam: Vitals:   11/30/18 1535  BP: 122/60  Pulse: (!) 52  SpO2: 97%  Weight: 134 lb (60.8 kg)  Height: 5\' 1"  (1.549 m)   Body mass index is 25.32 kg/m. Physical Exam Constitutional:      Appearance: Normal appearance.  Cardiovascular:     Rate and Rhythm: Rhythm irregular.     Pulses: Normal pulses.  Pulmonary:     Effort: Pulmonary effort is normal.     Breath sounds: Normal breath sounds.  Abdominal:     General: Bowel sounds are normal. There is no distension.     Palpations: Abdomen is soft. There is no mass.     Tenderness: There is no abdominal tenderness.  Musculoskeletal: Normal range of motion.        General: Tenderness present.     Comments: Tender over left lateral hip and muscles of lower lumbar region; using walker  Skin:    General: Skin is warm and dry.     Capillary Refill: Capillary refill takes less than 2 seconds.  Neurological:     General: No focal deficit present.     Mental Status: She is alert. Mental status is at baseline.     Comments: Poor short term memory, two daughters accompanied her  Psychiatric:        Mood and Affect: Mood normal.     Labs reviewed: Basic Metabolic Panel: Recent Labs    12/13/17 1840 12/14/17 0732  11/26/18 1144 11/26/18 2055 11/27/18 0532 11/28/18 1359  NA  --  129*   < > 137 136 138  --   K  --  4.4   < > 3.9 3.3* 3.2* 3.6  CL  --  97*   < > 101 100 103  --   CO2  --  21*   < > 24 21* 24  --   GLUCOSE  --  83   < > 113 137* 96  --   BUN  --  19   < > 28* 24* 19  --   CREATININE  --  1.28*   < >  1.55* 1.50* 1.49*  --   CALCIUM  --  8.7*   < > 9.5 9.1 8.7*  --   MG  --  1.8  --   --   --   --   --   TSH 8.725*  --   --  3.99  --   --   --    < > = values in this interval not displayed.   Liver Function  Tests: Recent Labs    12/03/17 1016 11/26/18 2055 11/27/18 0532  AST 28 27 109*  ALT 22 21 63*  ALKPHOS  --  72 121  BILITOT 1.1 0.9 1.6*  PROT 7.1 7.1 6.2*  ALBUMIN  --  3.3* 2.8*   Recent Labs    12/03/17 1016 11/26/18 2055  LIPASE 39 37  AMYLASE 36  --    No results for input(s): AMMONIA in the last 8760 hours. CBC: Recent Labs    02/26/18 1042 11/26/18 1144 11/26/18 2055 11/27/18 0532  WBC 5.4 8.3 9.0 6.4  NEUTROABS 3,883 6,499 6.9  --   HGB 11.3* 11.1* 10.5* 9.8*  HCT 34.2* 32.6* 32.6* 30.7*  MCV 89.8 90.1 95.9 94.5  PLT 287 332 323 273   Lipid Panel: No results for input(s): CHOL, HDL, LDLCALC, TRIG, CHOLHDL, LDLDIRECT in the last 8760 hours. Lab Results  Component Value Date   HGBA1C 6.2 (H) 05/02/2016    Procedures since last visit: Mr Lumbar Spine Wo Contrast  Result Date: 11/27/2018 CLINICAL DATA:  84 year old female status post fall with age indeterminate mild L2 superior endplate compression fracture on recent CT. EXAM: MRI LUMBAR SPINE WITHOUT CONTRAST TECHNIQUE: Multiplanar, multisequence MR imaging of the lumbar spine was performed. No intravenous contrast was administered. COMPARISON:  CT Abdomen and Pelvis 11/26/2018. Lumbar radiographs 11/20/2017. FINDINGS: Segmentation:  Normal on the comparison CT. Alignment: Mild dextroconvex lumbar scoliosis. Stable straightening of lordosis from the CT yesterday. Mild grade 1 anterolisthesis at L2-L3 and L3-L4. Vertebrae: Chronic degenerative throughout the lumbar spine but endplate marrow changes but no marrow edema or evidence of acute osseous abnormality. The mild L2 superior endplate deformity is chronic (series 7, image 6). Incidental T12 vertebral body hemangioma. Intact visible sacrum and SI joints. Conus medullaris and cauda equina: Conus extends to the L1 level. No lower spinal cord or conus signal abnormality. Paraspinal and other soft tissues: Negative visible abdominal viscera. Diverticulosis of the  large bowel in the pelvis. Partially visible urinary bladder distension. Disc levels: No lower thoracic spinal stenosis. L1-L2: Mild to moderate posterior element hypertrophy without stenosis. L2-L3: Disc space loss and mild anterolisthesis. Circumferential disc bulge and moderate posterior element hypertrophy. Mild spinal and bilateral foraminal stenosis. L3-L4: Disc space loss with vacuum disc. Subtle anterolisthesis. Circumferential disc osteophyte complex and moderate to severe posterior element hypertrophy. Severe spinal and right lateral recess stenosis (right L4 nerve level). Severe left L3 foraminal stenosis. L4-L5: Disc space loss with circumferential disc bulge and superimposed midline caudal disc extrusion (series 8, image 29). Moderate to severe posterior element hypertrophy. Moderate to severe spinal stenosis and right greater than left lateral recess stenosis (L5 nerve levels). Mild right L4 foraminal stenosis. L5-S1: Disc space loss with circumferential disc osteophyte complex and mild to moderate posterior element hypertrophy. Mild right lateral recess stenosis. Moderate bilateral L5 foraminal stenosis. IMPRESSION: 1. No acute osseous abnormality. The mild L2 superior endplate deformity is chronic. 2. Advanced lumbar spine degeneration except at L1-L2. Up to severe multifactorial spinal and lateral recess stenosis at L3-L4  and L4-L5. Severe left L3 and moderate bilateral L5 neural foraminal stenosis. Electronically Signed   By: Genevie Ann M.D.   On: 11/27/2018 15:08   Ct Abdomen Pelvis W Contrast  Result Date: 11/26/2018 CLINICAL DATA:  Fall.  Confusion. EXAM: CT ABDOMEN AND PELVIS WITH CONTRAST TECHNIQUE: Multidetector CT imaging of the abdomen and pelvis was performed using the standard protocol following bolus administration of intravenous contrast. CONTRAST:  28mL OMNIPAQUE IOHEXOL 300 MG/ML  SOLN COMPARISON:  Lumbar spine x-rays dated January 21, 2017. FINDINGS: Lower chest: Cardiomegaly.  Bibasilar subsegmental atelectasis and mild interlobular septal thickening. Trace left pleural effusion. Hepatobiliary: Tiny calcified granulomas in the right hepatic lobe. Status post cholecystectomy. Mild intra and extrahepatic biliary dilatation, likely related to post cholecystectomy state. Pancreas: Mild main pancreatic duct dilatation, measuring 4 mm. No mass or surrounding inflammatory changes. Spleen: Normal in size without focal abnormality. Adrenals/Urinary Tract: The right adrenal gland is unremarkable. Mild left adrenal thickening without focal nodule. Moderately atrophic right kidney delayed enhancement and excretion of contrast. Subcentimeter low-density lesions in both kidneys are too small to characterize. No renal or ureteral calculi. No hydronephrosis. Bladder is unremarkable. Stomach/Bowel: Small hiatal hernia. The stomach is otherwise within normal limits. No bowel wall thickening, distention, or surrounding inflammatory changes. Extensive distal descending and sigmoid colonic diverticulosis. The appendix is not identified, but there are no signs of inflammation at the base of the cecum. Vascular/Lymphatic: Aortoiliac atherosclerotic vascular disease. No aneurysm. Severe stenosis of the right renal artery origin. No enlarged abdominal or pelvic lymph nodes. Reproductive: Status post hysterectomy. No adnexal masses. Other: No abdominal wall hernia or abnormality. No abdominopelvic ascites. No pneumoperitoneum. Musculoskeletal: Age-indeterminate mild L2 superior endplate compression deformity, new since February 2018. Moderate to severe lumbar spondylosis. Moderate to severe right and mild left hip osteoarthritis. IMPRESSION: 1.  No acute intra-abdominal process. 2. Age-indeterminate mild L2 superior endplate compression deformity, new since February 2018. Correlate with point tenderness. 3. Mild interstitial pulmonary edema.  Trace left pleural effusion. 4. Moderate right renal atrophy likely  related to longstanding hypoperfusion given severe right renal artery origin stenosis. 5.  Aortic atherosclerosis (ICD10-I70.0). Electronically Signed   By: Titus Dubin M.D.   On: 11/26/2018 23:29   Dg Hip Unilat With Pelvis 2-3 Views Left  Result Date: 11/26/2018 CLINICAL DATA:  Left hip pain. EXAM: DG HIP (WITH OR WITHOUT PELVIS) 2-3V LEFT COMPARISON:  No prior. FINDINGS: Pelvic calcifications consistent phleboliths. Calcifications noted the buttock consistent with injection granulomas. No acute bony abnormality identified. No evidence of fracture or dislocation. Peripheral vascular calcification. IMPRESSION: 1.  Degenerative changes left hip. 2.  No acute bony or joint abnormality. 3.  Peripheral vascular disease. Electronically Signed   By: Marcello Moores  Register   On: 11/26/2018 17:22    Assessment/Plan 1. Spinal stenosis of lumbar region with radiculopathy -seems this was truly the cause of her pain -there is mention of compression fx, but not felt to be acute or in need of kyphoplasty (not a candidate anyway) -cont gabapentin, heat, may use patches separately that have lidocaine locally, PT  2. Persistent atrial fibrillation -unclear why short acting beta blocker was reduced to daily but dose kept at 50mg  for low pulse at the hospital -will try to reduce dose and return to bid dose -will copy Dr. Loletha Grayer on this note since pt has upcoming visit -she managed w/o any rapid afib thus far with the change, but it concerned me she could develop overnight when medication is not in her  system - metoprolol tartrate (LOPRESSOR) 25 MG tablet; Take 1 tablet (25 mg total) by mouth 2 (two) times daily.  3. Closed compression fracture of L2 lumbar vertebra, initial encounter ([place]) -noted on imaging during hospitalization, but does not have typical presentation whatsoever--much more consistent with a flare of her spinal stenosis with radiculopathy - given her usual prolia for OP today - denosumab (PROLIA)  injection 60 mg  4. Age-related osteoporosis with current pathological fracture, initial encounter - cont prolia--given today, cont vitamin D and has had adequate calcium - denosumab (PROLIA) injection 60 mg  5. Episodic lightheadedness -persisted wit 50mg  metoprolol so try reduction--hopefully will do ok, will copy cardiology on this  Labs/tests ordered:  No orders of the defined types were placed in this encounter.   Next appt:  01/28/2019  Cordero Surette L. Marylyn Appenzeller, D.O. [facility] [address], [place] [address] Cell Phone (Mon-Fri 8am-5pm):  [phone] On Call:  [phone] & follow prompts after 5pm & weekends Office Phone:  [phone] Office Fax:  [phone]

## 2018-11-30 NOTE — Telephone Encounter (Signed)
Made in error

## 2018-12-01 ENCOUNTER — Telehealth: Payer: Self-pay

## 2018-12-01 ENCOUNTER — Telehealth: Payer: Self-pay | Admitting: *Deleted

## 2018-12-01 DIAGNOSIS — Z9181 History of falling: Secondary | ICD-10-CM | POA: Diagnosis not present

## 2018-12-01 DIAGNOSIS — R262 Difficulty in walking, not elsewhere classified: Secondary | ICD-10-CM | POA: Diagnosis not present

## 2018-12-01 DIAGNOSIS — R2681 Unsteadiness on feet: Secondary | ICD-10-CM | POA: Diagnosis not present

## 2018-12-01 DIAGNOSIS — M6281 Muscle weakness (generalized): Secondary | ICD-10-CM | POA: Diagnosis not present

## 2018-12-01 NOTE — Telephone Encounter (Signed)
Brayton Layman called stating she just spoke with Anita/CMA requesting verbal orders for PT and forgot to request order for OT, which the hospital also recommended for patient.  Per [employer] standing order, verbal order given. Message will be sent to patient's provider as a FYI. ;

## 2018-12-01 NOTE — Telephone Encounter (Signed)
Brittney Gutierrez with [employer] at [employer] called requesting verbal order to evaluate and treat for Physical Therapy. Patient was discharged from hospital with PT orders. Patient was seen in office yesterday. Verbal orders given.

## 2018-12-03 DIAGNOSIS — M6281 Muscle weakness (generalized): Secondary | ICD-10-CM | POA: Diagnosis not present

## 2018-12-03 DIAGNOSIS — Z9181 History of falling: Secondary | ICD-10-CM | POA: Diagnosis not present

## 2018-12-03 DIAGNOSIS — R2681 Unsteadiness on feet: Secondary | ICD-10-CM | POA: Diagnosis not present

## 2018-12-03 DIAGNOSIS — R262 Difficulty in walking, not elsewhere classified: Secondary | ICD-10-CM | POA: Diagnosis not present

## 2018-12-04 DIAGNOSIS — Z9181 History of falling: Secondary | ICD-10-CM | POA: Diagnosis not present

## 2018-12-04 DIAGNOSIS — M6281 Muscle weakness (generalized): Secondary | ICD-10-CM | POA: Diagnosis not present

## 2018-12-04 DIAGNOSIS — R2681 Unsteadiness on feet: Secondary | ICD-10-CM | POA: Diagnosis not present

## 2018-12-04 DIAGNOSIS — R262 Difficulty in walking, not elsewhere classified: Secondary | ICD-10-CM | POA: Diagnosis not present

## 2018-12-07 DIAGNOSIS — R262 Difficulty in walking, not elsewhere classified: Secondary | ICD-10-CM | POA: Diagnosis not present

## 2018-12-07 DIAGNOSIS — M6281 Muscle weakness (generalized): Secondary | ICD-10-CM | POA: Diagnosis not present

## 2018-12-07 DIAGNOSIS — R2681 Unsteadiness on feet: Secondary | ICD-10-CM | POA: Diagnosis not present

## 2018-12-07 DIAGNOSIS — Z9181 History of falling: Secondary | ICD-10-CM | POA: Diagnosis not present

## 2018-12-07 NOTE — Progress Notes (Signed)
Thanks, I agree with the twice daily Rx on her beta blocker. I will keep you posted how she is doing at her upcoming appt. MCr

## 2018-12-08 ENCOUNTER — Telehealth: Payer: Self-pay

## 2018-12-08 DIAGNOSIS — Z9181 History of falling: Secondary | ICD-10-CM | POA: Diagnosis not present

## 2018-12-08 DIAGNOSIS — R262 Difficulty in walking, not elsewhere classified: Secondary | ICD-10-CM | POA: Diagnosis not present

## 2018-12-08 DIAGNOSIS — R2681 Unsteadiness on feet: Secondary | ICD-10-CM | POA: Diagnosis not present

## 2018-12-08 DIAGNOSIS — M6281 Muscle weakness (generalized): Secondary | ICD-10-CM | POA: Diagnosis not present

## 2018-12-08 NOTE — Telephone Encounter (Signed)
Continue gabapentin bid for one week, then try daily for one week, then stop.  If pain down in her back and left leg return, she will need to be restarted on it or lyrica alternatively (likely same side effects if they have anything to do with the medication and are not from her dementia)

## 2018-12-08 NOTE — Telephone Encounter (Signed)
Spoke with with Brittney Gutierrez verbalized understanding of Dr.Reed's response   Medication list updated to reflect change in Gabapentin

## 2018-12-08 NOTE — Telephone Encounter (Signed)
Patient's daughter called to question if Gabapentin can be discontinued or prescribed "as needed only"  Patient has crying spells and becomes very anxious shortly after taking medication. [name] decreased medication to twice daily from three times daily. [name] looked up medication and seen info about possible side effects if stopped cold [place].   Please advise on how medication should be tapered

## 2018-12-09 DIAGNOSIS — R262 Difficulty in walking, not elsewhere classified: Secondary | ICD-10-CM | POA: Diagnosis not present

## 2018-12-09 DIAGNOSIS — M6281 Muscle weakness (generalized): Secondary | ICD-10-CM | POA: Diagnosis not present

## 2018-12-09 DIAGNOSIS — R2681 Unsteadiness on feet: Secondary | ICD-10-CM | POA: Diagnosis not present

## 2018-12-09 DIAGNOSIS — Z9181 History of falling: Secondary | ICD-10-CM | POA: Diagnosis not present

## 2018-12-10 ENCOUNTER — Other Ambulatory Visit: Payer: Self-pay | Admitting: Internal Medicine

## 2018-12-10 ENCOUNTER — Other Ambulatory Visit: Payer: Self-pay | Admitting: Cardiovascular Disease

## 2018-12-10 DIAGNOSIS — F419 Anxiety disorder, unspecified: Principal | ICD-10-CM

## 2018-12-10 DIAGNOSIS — M6281 Muscle weakness (generalized): Secondary | ICD-10-CM | POA: Diagnosis not present

## 2018-12-10 DIAGNOSIS — Z9181 History of falling: Secondary | ICD-10-CM | POA: Diagnosis not present

## 2018-12-10 DIAGNOSIS — R2681 Unsteadiness on feet: Secondary | ICD-10-CM | POA: Diagnosis not present

## 2018-12-10 DIAGNOSIS — F329 Major depressive disorder, single episode, unspecified: Secondary | ICD-10-CM

## 2018-12-10 DIAGNOSIS — F32A Depression, unspecified: Secondary | ICD-10-CM

## 2018-12-10 DIAGNOSIS — R262 Difficulty in walking, not elsewhere classified: Secondary | ICD-10-CM | POA: Diagnosis not present

## 2018-12-14 DIAGNOSIS — R262 Difficulty in walking, not elsewhere classified: Secondary | ICD-10-CM | POA: Diagnosis not present

## 2018-12-14 DIAGNOSIS — R2681 Unsteadiness on feet: Secondary | ICD-10-CM | POA: Diagnosis not present

## 2018-12-14 DIAGNOSIS — M6281 Muscle weakness (generalized): Secondary | ICD-10-CM | POA: Diagnosis not present

## 2018-12-14 DIAGNOSIS — Z9181 History of falling: Secondary | ICD-10-CM | POA: Diagnosis not present

## 2018-12-15 DIAGNOSIS — R2681 Unsteadiness on feet: Secondary | ICD-10-CM | POA: Diagnosis not present

## 2018-12-15 DIAGNOSIS — R262 Difficulty in walking, not elsewhere classified: Secondary | ICD-10-CM | POA: Diagnosis not present

## 2018-12-15 DIAGNOSIS — Z9181 History of falling: Secondary | ICD-10-CM | POA: Diagnosis not present

## 2018-12-15 DIAGNOSIS — M6281 Muscle weakness (generalized): Secondary | ICD-10-CM | POA: Diagnosis not present

## 2018-12-17 ENCOUNTER — Ambulatory Visit (INDEPENDENT_AMBULATORY_CARE_PROVIDER_SITE_OTHER): Payer: Medicare Other | Admitting: Medical

## 2018-12-17 ENCOUNTER — Encounter: Payer: Self-pay | Admitting: Medical

## 2018-12-17 VITALS — BP 134/62 | HR 72 | Ht 61.0 in | Wt 132.8 lb

## 2018-12-17 DIAGNOSIS — I10 Essential (primary) hypertension: Secondary | ICD-10-CM

## 2018-12-17 DIAGNOSIS — I480 Paroxysmal atrial fibrillation: Secondary | ICD-10-CM | POA: Diagnosis not present

## 2018-12-17 DIAGNOSIS — R2681 Unsteadiness on feet: Secondary | ICD-10-CM | POA: Diagnosis not present

## 2018-12-17 DIAGNOSIS — I495 Sick sinus syndrome: Secondary | ICD-10-CM | POA: Diagnosis not present

## 2018-12-17 DIAGNOSIS — I5032 Chronic diastolic (congestive) heart failure: Secondary | ICD-10-CM | POA: Diagnosis not present

## 2018-12-17 DIAGNOSIS — R262 Difficulty in walking, not elsewhere classified: Secondary | ICD-10-CM | POA: Diagnosis not present

## 2018-12-17 DIAGNOSIS — M6281 Muscle weakness (generalized): Secondary | ICD-10-CM | POA: Diagnosis not present

## 2018-12-17 DIAGNOSIS — Z9181 History of falling: Secondary | ICD-10-CM | POA: Diagnosis not present

## 2018-12-17 NOTE — Patient Instructions (Signed)
Medication Instructions:  Your physician recommends that you continue on your current medications as directed. Please refer to the Current Medication list given to you today.     If you need a refill on your cardiac medications before your next appointment, please call your pharmacy.   Lab work:  NONE  If you have labs (blood work) drawn today and your tests are completely normal, you will receive your results only by: [name] MyChart Message (if you have MyChart) OR . A paper copy in the mail If you have any lab test that is abnormal or we need to change your treatment, we will call you to review the results.  Testing/Procedures: NONE    Follow-Up: At [facility], you and your health needs are our priority.  As part of our continuing mission to provide you with exceptional heart care, we have created designated Provider Care Teams.  These Care Teams include your primary Cardiologist (physician) and Advanced Practice Providers (APPs -  Physician Assistants and Nurse Practitioners) who all work together to provide you with the care you need, when you need it. You will need a follow up appointment in 6 months (July 2020). Please call our office 2 months(April 2020) in advance to schedule this appointment.  You may see Sanda Klein, MD or one of the following Advanced Practice Providers on your designated Care Team: [place], [place] . Fabian Sharp, PA-C  Any Other Special Instructions Will Be Listed Below (If Applicable).

## 2018-12-17 NOTE — Progress Notes (Signed)
Cardiology Office Note   Date:  12/17/2018   ID:  Brittney Gutierrez, DOB [birth year], MRN [id number]  PCP:  Gayland Curry, DO  Cardiologist:  Sanda Klein, MD EP: None  Chief Complaint  Patient presents with  . Hospitalization Follow-up      History of Present Illness: Brittney Gutierrez is a 85 y.o. female with a PMH of chronic diastolic CHF, paroxysmal atrial fibrillation, HTN, SSS however patient declined PPM, and recent admission for back pain where she was found to have L2 compression fractures, who presents for post-hospital follow-up.   She was admitted to the hospital 11/26/18-11/29/18 with acute back pain after a fall. She was found to have an L2 compression fracture which was evaluated by neurosurgery and not recommended for surgical intervention or kyphoplasty. She was discharged to her assisted living facility to complete PT/OT. Her metoprolol was decreased from 50mg  BID to 50mg  daily due to concerns for bradycardia. She was seen by her PCP after discharge and metoprolol was changed to 25mg  BID.   She returns today with her daughter for post-hospital follow-up. She reports doing well since discharge and has been working with PT/OT with significant improvement in back pain. No cardiac complaints at this visit. She denies chest pain, SOB, DOE, orthopnea, PND, LE edema, dizziness, lightheadedness, or syncope.    Past Medical History:  Diagnosis Date  . Abnormal weight loss 05/28/2004  . Anxiety 01/17/2003  . Back pain 08/19/2005   with radiculopathy  . Bradycardia, drug induced    Diltiazem  . Cerebral atherosclerosis 09/24/1999  . Chest pain, atypical 04/2016   minor CAD at cath   . CHF (congestive heart failure) ([place])   . Chronic anticoagulation    Eliquis  . Dementia ([place])    "don't know kind or stage" (05/02/2016)  . Depression 09/06/2002  . Diverticulitis 09/28/2007  . External hemorrhoids 04/12/2009  . Female climacteric state 03/04/2000  . Formed visual  hallucinations    [name] Syndrome?  Failed Keppra and Depakote  . GERD (gastroesophageal reflux disease)   . Hypertensive cardiovascular disease    moderate LVH on echo march 2017  . Macular degeneration   . Malaise and fatigue   . Neurocysticercosis    Craniotomy at [facility] in early 2000s  . Osteoarthrosis, localized   . Osteoporosis 05/28/2004  . Paranoia ([place])   . Paroxysmal A-fib ([place])   . TIA (transient ischemic attack) 2000s?  [name] Urinary incontinence 08/18/2007  . UTI (urinary tract infection)   . Vaginitis, atrophic   . Vertigo, peripheral 10/07/2000    Past Surgical History:  Procedure Laterality Date  . BRAIN SURGERY  2000   Neurocysticercosis ("parasite")  . CARDIAC CATHETERIZATION N/A 05/03/2016   Procedure: Left Heart Cath and Coronary Angiography;  Surgeon: Sherren Mocha, MD;  Location: [place] [facility];  Service: Cardiovascular;  Laterality: N/A;  . CARDIOVERSION N/A 07/14/2017   Procedure: CARDIOVERSION;  Surgeon: Pixie Casino, MD;  Location: [facility] ENDOSCOPY;  Service: Cardiovascular;  Laterality: N/A;  . CATARACT EXTRACTION  2013  . CATARACT EXTRACTION, BILATERAL     "not sure if both eyes; feel like it probably was"  . JOINT REPLACEMENT    . PERIPHERAL VASCULAR CATHETERIZATION N/A 05/03/2016   Procedure: Abdominal Aortogram;  Surgeon: Sherren Mocha, MD;  Location: [place] [facility];  Service: Cardiovascular;  Laterality: N/A;  . SHOULDER ARTHROSCOPY W/ ROTATOR CUFF REPAIR Right X 3  . TEE WITHOUT CARDIOVERSION N/A 07/14/2017   Procedure: TRANSESOPHAGEAL ECHOCARDIOGRAM (TEE);  Surgeon: Pixie Casino, MD;  Location: [place];  Service: Cardiovascular;  Laterality: N/A;  . TOTAL KNEE ARTHROPLASTY Left 1990  . VAGINAL HYSTERECTOMY  1960     Current Outpatient Medications  Medication Sig Dispense Refill  . acetaminophen (TYLENOL) 325 MG tablet Take 650 mg by mouth every 6 (six) hours as needed for mild pain.    [name] amLODipine (NORVASC) 10 MG tablet  Take 1 tablet (10 mg total) by mouth daily. 90 tablet 1  . Cholecalciferol (VITAMIN D3) 2000 units TABS Take 2,000 Units by mouth daily.     [name] docusate sodium (COLACE) 100 MG capsule Take 100 mg by mouth every other day.     . donepezil (ARICEPT) 10 MG tablet Take 1 tablet (10 mg total) by mouth daily. 90 tablet 3  . ELIQUIS 2.5 MG TABS tablet TAKE 1 TABLET TWICE DAILY 180 tablet 1  . furosemide (LASIX) 40 MG tablet Take 40 mg by mouth daily.    [name] losartan (COZAAR) 25 MG tablet TAKE 1 TABLET EVERY DAY 90 tablet 3  . metoprolol tartrate (LOPRESSOR) 25 MG tablet Take 1 tablet (25 mg total) by mouth 2 (two) times daily.    [name] venlafaxine (EFFEXOR) 75 MG tablet TAKE 1 TABLET TWICE DAILY 180 tablet 1   No current facility-administered medications for this visit.     Allergies:   Tape; Bactrim [sulfamethoxazole-trimethoprim]; Amiodarone; Amoxicillin; Demerol [meperidine]; Hydralazine; Lisinopril; and Zoloft [sertraline hcl]    Social History:  The patient  reports that she has never smoked. She has never used smokeless tobacco. She reports that she does not drink alcohol or use drugs.   Family History:  The patient's family history includes Heart attack in her father; Hypertension in her sister; Polycystic kidney disease in her mother.    ROS:  Please see the history of present illness.   Otherwise, review of systems are positive for none.   All other systems are reviewed and negative.    PHYSICAL EXAM: VS:  BP 134/62   Pulse 72   Ht 5\' 1"  (1.549 m)   Wt 132 lb 12.8 oz (60.2 kg)   BMI 25.09 kg/m  , BMI Body mass index is 25.09 kg/m. GEN: Well nourished, well developed, in no acute distress HEENT: sclera anicteric Neck: no JVD, carotid bruits, or masses Cardiac: RRR; no murmurs, rubs, or gallops,no edema  Respiratory:  clear to auscultation bilaterally, normal work of breathing GI: soft, nontender, nondistended, + BS MS: no deformity or atrophy Skin: warm and dry, no rash Neuro:   Strength and sensation are intact Psych: pleasant   EKG:  EKG is ordered today. The ekg ordered today demonstrates sinus rhythm, rate 72, with frequent PACs/PVCs, LVH, no STE/D   Recent Labs: 11/26/2018: TSH 3.99 11/27/2018: ALT 63; BUN 19; Creatinine, Ser 1.49; Hemoglobin 9.8; Platelets 273; Sodium 138 11/28/2018: Potassium 3.6    Lipid Panel    Component Value Date/Time   CHOL 195 05/02/2016 0732   TRIG 49 05/02/2016 0732   HDL 65 05/02/2016 0732   CHOLHDL 3.0 05/02/2016 0732   VLDL 10 05/02/2016 0732   LDLCALC 120 (H) 05/02/2016 0732      Wt Readings from Last 3 Encounters:  12/17/18 132 lb 12.8 oz (60.2 kg)  11/30/18 134 lb (60.8 kg)  11/29/18 132 lb 4.8 oz (60 kg)      Other studies Reviewed: Additional studies/ records that were reviewed today include:   Echocardiogram 07/2017: Study Conclusions  - Left ventricle: The  cavity size was normal. Wall thickness was   increased in a pattern of moderate LVH. Systolic function was   normal. The estimated ejection fraction was in the range of 50%   to 55%. Wall motion was normal; there were no regional wall   motion abnormalities. The study is not technically sufficient to   allow evaluation of LV diastolic function. - Aortic valve: There was mild regurgitation. - Mitral valve: Calcified annulus. There was mild regurgitation. - Left atrium: The atrium was moderately dilated. - Pulmonary arteries: Systolic pressure was mildly to moderately   increased. PA peak pressure: 48 mm Hg (S).  Impressions:  - Normal LV systolic function; moderate LVH; sclerotic aortic valve   with mild AI; mild MR; moderate LAE; mild TR with mild to   moderate elevation in pulmonary pressure.     ASSESSMENT AND PLAN:  1. Chronic diastolic CHF: no volume overload complaints. She appears euvolemic on exam. Weight stable at 132lbs today.  - Continue metoprolol, losartan, and lasix - Continue to monitor daily weights  2. Paroxysmal  atrial fibrillation: EKG with sinus rhythm today. No complaints of palpitations or feeling her heart race. No complaints of bleeding with eliquis.  - Continue metoprolol 25mg  BID for rate control - Continue eliquis for stroke ppx  3. HTN: BP stable at 134/62 today - Continue amlodipine, metoprolol, losartan, and lasix  4. SSS: patient not amenable to PPM implantation in the past and is relatively asymptomatic when bradycardic.  - Continue metoprolol 25mg  BID for now but avoid additional AV nodal blocking agents   Current medicines are reviewed at length with the patient today.  The patient does not have concerns regarding medicines.  The following changes have been made:  no change  Labs/ tests ordered today include: None  Orders Placed This Encounter  Procedures  . EKG 12-Lead     Disposition:   FU with Dr. Sallyanne Kuster in 6 months  Signed, Abigail Butts, PA-C  12/17/2018 4:30 PM

## 2018-12-17 NOTE — Progress Notes (Signed)
Thank you MCr 

## 2018-12-18 DIAGNOSIS — Z9181 History of falling: Secondary | ICD-10-CM | POA: Diagnosis not present

## 2018-12-18 DIAGNOSIS — R262 Difficulty in walking, not elsewhere classified: Secondary | ICD-10-CM | POA: Diagnosis not present

## 2018-12-18 DIAGNOSIS — R2681 Unsteadiness on feet: Secondary | ICD-10-CM | POA: Diagnosis not present

## 2018-12-18 DIAGNOSIS — M6281 Muscle weakness (generalized): Secondary | ICD-10-CM | POA: Diagnosis not present

## 2018-12-21 DIAGNOSIS — R2681 Unsteadiness on feet: Secondary | ICD-10-CM | POA: Diagnosis not present

## 2018-12-21 DIAGNOSIS — M6281 Muscle weakness (generalized): Secondary | ICD-10-CM | POA: Diagnosis not present

## 2018-12-21 DIAGNOSIS — R262 Difficulty in walking, not elsewhere classified: Secondary | ICD-10-CM | POA: Diagnosis not present

## 2018-12-21 DIAGNOSIS — Z9181 History of falling: Secondary | ICD-10-CM | POA: Diagnosis not present

## 2018-12-22 DIAGNOSIS — R262 Difficulty in walking, not elsewhere classified: Secondary | ICD-10-CM | POA: Diagnosis not present

## 2018-12-22 DIAGNOSIS — M6281 Muscle weakness (generalized): Secondary | ICD-10-CM | POA: Diagnosis not present

## 2018-12-22 DIAGNOSIS — Z9181 History of falling: Secondary | ICD-10-CM | POA: Diagnosis not present

## 2018-12-22 DIAGNOSIS — R2681 Unsteadiness on feet: Secondary | ICD-10-CM | POA: Diagnosis not present

## 2018-12-23 DIAGNOSIS — Z9181 History of falling: Secondary | ICD-10-CM | POA: Diagnosis not present

## 2018-12-23 DIAGNOSIS — R262 Difficulty in walking, not elsewhere classified: Secondary | ICD-10-CM | POA: Diagnosis not present

## 2018-12-23 DIAGNOSIS — M6281 Muscle weakness (generalized): Secondary | ICD-10-CM | POA: Diagnosis not present

## 2018-12-23 DIAGNOSIS — R2681 Unsteadiness on feet: Secondary | ICD-10-CM | POA: Diagnosis not present

## 2018-12-24 DIAGNOSIS — M6281 Muscle weakness (generalized): Secondary | ICD-10-CM | POA: Diagnosis not present

## 2018-12-24 DIAGNOSIS — R2681 Unsteadiness on feet: Secondary | ICD-10-CM | POA: Diagnosis not present

## 2018-12-24 DIAGNOSIS — Z9181 History of falling: Secondary | ICD-10-CM | POA: Diagnosis not present

## 2018-12-24 DIAGNOSIS — R262 Difficulty in walking, not elsewhere classified: Secondary | ICD-10-CM | POA: Diagnosis not present

## 2018-12-25 DIAGNOSIS — Z9181 History of falling: Secondary | ICD-10-CM | POA: Diagnosis not present

## 2018-12-25 DIAGNOSIS — R262 Difficulty in walking, not elsewhere classified: Secondary | ICD-10-CM | POA: Diagnosis not present

## 2018-12-25 DIAGNOSIS — M6281 Muscle weakness (generalized): Secondary | ICD-10-CM | POA: Diagnosis not present

## 2018-12-25 DIAGNOSIS — R2681 Unsteadiness on feet: Secondary | ICD-10-CM | POA: Diagnosis not present

## 2018-12-28 DIAGNOSIS — R262 Difficulty in walking, not elsewhere classified: Secondary | ICD-10-CM | POA: Diagnosis not present

## 2018-12-28 DIAGNOSIS — M6281 Muscle weakness (generalized): Secondary | ICD-10-CM | POA: Diagnosis not present

## 2018-12-28 DIAGNOSIS — Z9181 History of falling: Secondary | ICD-10-CM | POA: Diagnosis not present

## 2018-12-28 DIAGNOSIS — R2681 Unsteadiness on feet: Secondary | ICD-10-CM | POA: Diagnosis not present

## 2018-12-29 DIAGNOSIS — R262 Difficulty in walking, not elsewhere classified: Secondary | ICD-10-CM | POA: Diagnosis not present

## 2018-12-29 DIAGNOSIS — Z9181 History of falling: Secondary | ICD-10-CM | POA: Diagnosis not present

## 2018-12-29 DIAGNOSIS — M6281 Muscle weakness (generalized): Secondary | ICD-10-CM | POA: Diagnosis not present

## 2018-12-29 DIAGNOSIS — R2681 Unsteadiness on feet: Secondary | ICD-10-CM | POA: Diagnosis not present

## 2018-12-30 DIAGNOSIS — Z9181 History of falling: Secondary | ICD-10-CM | POA: Diagnosis not present

## 2018-12-30 DIAGNOSIS — M6281 Muscle weakness (generalized): Secondary | ICD-10-CM | POA: Diagnosis not present

## 2018-12-30 DIAGNOSIS — R262 Difficulty in walking, not elsewhere classified: Secondary | ICD-10-CM | POA: Diagnosis not present

## 2018-12-30 DIAGNOSIS — R2681 Unsteadiness on feet: Secondary | ICD-10-CM | POA: Diagnosis not present

## 2018-12-31 DIAGNOSIS — Z9181 History of falling: Secondary | ICD-10-CM | POA: Diagnosis not present

## 2018-12-31 DIAGNOSIS — R2681 Unsteadiness on feet: Secondary | ICD-10-CM | POA: Diagnosis not present

## 2018-12-31 DIAGNOSIS — M6281 Muscle weakness (generalized): Secondary | ICD-10-CM | POA: Diagnosis not present

## 2018-12-31 DIAGNOSIS — R262 Difficulty in walking, not elsewhere classified: Secondary | ICD-10-CM | POA: Diagnosis not present

## 2019-01-01 DIAGNOSIS — Z9181 History of falling: Secondary | ICD-10-CM | POA: Diagnosis not present

## 2019-01-01 DIAGNOSIS — R262 Difficulty in walking, not elsewhere classified: Secondary | ICD-10-CM | POA: Diagnosis not present

## 2019-01-01 DIAGNOSIS — M6281 Muscle weakness (generalized): Secondary | ICD-10-CM | POA: Diagnosis not present

## 2019-01-01 DIAGNOSIS — R2681 Unsteadiness on feet: Secondary | ICD-10-CM | POA: Diagnosis not present

## 2019-01-04 DIAGNOSIS — L308 Other specified dermatitis: Secondary | ICD-10-CM | POA: Diagnosis not present

## 2019-01-05 DIAGNOSIS — M6281 Muscle weakness (generalized): Secondary | ICD-10-CM | POA: Diagnosis not present

## 2019-01-05 DIAGNOSIS — R2681 Unsteadiness on feet: Secondary | ICD-10-CM | POA: Diagnosis not present

## 2019-01-05 DIAGNOSIS — Z9181 History of falling: Secondary | ICD-10-CM | POA: Diagnosis not present

## 2019-01-05 DIAGNOSIS — R262 Difficulty in walking, not elsewhere classified: Secondary | ICD-10-CM | POA: Diagnosis not present

## 2019-01-06 DIAGNOSIS — R262 Difficulty in walking, not elsewhere classified: Secondary | ICD-10-CM | POA: Diagnosis not present

## 2019-01-06 DIAGNOSIS — R2681 Unsteadiness on feet: Secondary | ICD-10-CM | POA: Diagnosis not present

## 2019-01-06 DIAGNOSIS — M6281 Muscle weakness (generalized): Secondary | ICD-10-CM | POA: Diagnosis not present

## 2019-01-06 DIAGNOSIS — Z9181 History of falling: Secondary | ICD-10-CM | POA: Diagnosis not present

## 2019-01-07 DIAGNOSIS — Z9181 History of falling: Secondary | ICD-10-CM | POA: Diagnosis not present

## 2019-01-07 DIAGNOSIS — R2681 Unsteadiness on feet: Secondary | ICD-10-CM | POA: Diagnosis not present

## 2019-01-07 DIAGNOSIS — M6281 Muscle weakness (generalized): Secondary | ICD-10-CM | POA: Diagnosis not present

## 2019-01-07 DIAGNOSIS — R262 Difficulty in walking, not elsewhere classified: Secondary | ICD-10-CM | POA: Diagnosis not present

## 2019-01-11 DIAGNOSIS — M6281 Muscle weakness (generalized): Secondary | ICD-10-CM | POA: Diagnosis not present

## 2019-01-11 DIAGNOSIS — R2681 Unsteadiness on feet: Secondary | ICD-10-CM | POA: Diagnosis not present

## 2019-01-11 DIAGNOSIS — Z9181 History of falling: Secondary | ICD-10-CM | POA: Diagnosis not present

## 2019-01-12 DIAGNOSIS — Z9181 History of falling: Secondary | ICD-10-CM | POA: Diagnosis not present

## 2019-01-12 DIAGNOSIS — M6281 Muscle weakness (generalized): Secondary | ICD-10-CM | POA: Diagnosis not present

## 2019-01-12 DIAGNOSIS — R2681 Unsteadiness on feet: Secondary | ICD-10-CM | POA: Diagnosis not present

## 2019-01-14 DIAGNOSIS — M25512 Pain in left shoulder: Secondary | ICD-10-CM | POA: Diagnosis not present

## 2019-01-14 DIAGNOSIS — M7542 Impingement syndrome of left shoulder: Secondary | ICD-10-CM | POA: Diagnosis not present

## 2019-01-21 ENCOUNTER — Ambulatory Visit (INDEPENDENT_AMBULATORY_CARE_PROVIDER_SITE_OTHER): Payer: Medicare Other | Admitting: Internal Medicine

## 2019-01-21 ENCOUNTER — Encounter: Payer: Self-pay | Admitting: Internal Medicine

## 2019-01-21 VITALS — BP 140/70 | Temp 97.9°F | Ht 61.0 in | Wt 134.0 lb

## 2019-01-21 DIAGNOSIS — J069 Acute upper respiratory infection, unspecified: Secondary | ICD-10-CM

## 2019-01-21 DIAGNOSIS — B9789 Other viral agents as the cause of diseases classified elsewhere: Secondary | ICD-10-CM

## 2019-01-21 NOTE — Patient Instructions (Signed)
Upper Respiratory Infection, Adult An upper respiratory infection (URI) is a common viral infection of the nose, throat, and upper air passages that lead to the lungs. The most common type of URI is the common cold. URIs usually get better on their own, without medical treatment. What are the causes? A URI is caused by a virus. You may catch a virus by:  Breathing in droplets from an infected person's cough or sneeze.  Touching something that has been exposed to the virus (contaminated) and then touching your mouth, nose, or eyes. What increases the risk? You are more likely to get a URI if:  You are very young or very old.  It is autumn or winter.  You have close contact with others, such as at a daycare, school, or health care facility.  You smoke.  You have long-term (chronic) heart or lung disease.  You have a weakened disease-fighting (immune) system.  You have nasal allergies or asthma.  You are experiencing a lot of stress.  You work in an area that has poor air circulation.  You have poor nutrition. What are the signs or symptoms? A URI usually involves some of the following symptoms:  Runny or stuffy (congested) nose.  Sneezing.  Cough.  Sore throat.  Headache.  Fatigue.  Fever.  Loss of appetite.  Pain in your forehead, behind your eyes, and over your cheekbones (sinus pain).  Muscle aches.  Redness or irritation of the eyes.  Pressure in the ears or face. How is this diagnosed? This condition may be diagnosed based on your medical history and symptoms, and a physical exam. Your health care provider may use a cotton swab to take a mucus sample from your nose (nasal swab). This sample can be tested to determine what virus is causing the illness. How is this treated? URIs usually get better on their own within 7-10 days. You can take steps at home to relieve your symptoms. Medicines cannot cure URIs, but your health care provider may recommend  certain medicines to help relieve symptoms, such as:  Over-the-counter cold medicines.  Cough suppressants. Coughing is a type of defense against infection that helps to clear the respiratory system, so take these medicines only as recommended by your health care provider.  Fever-reducing medicines. Follow these instructions at home: Activity  Rest as needed.  If you have a fever, stay home from work or school until your fever is gone or until your health care provider says you are no longer contagious. Your health care provider may have you wear a face mask to prevent your infection from spreading. Relieving symptoms  Gargle with a salt-water mixture 3-4 times a day or as needed. To make a salt-water mixture, completely dissolve -1 tsp of salt in 1 cup of warm water.  Use a cool-mist humidifier to add moisture to the air. This can help you breathe more easily. Eating and drinking   Drink enough fluid to keep your urine pale yellow.  Eat soups and other clear broths. General instructions   Take over-the-counter and prescription medicines only as told by your health care provider. These include cold medicines, fever reducers, and cough suppressants.  Do not use any products that contain nicotine or tobacco, such as cigarettes and e-cigarettes. If you need help quitting, ask your health care provider.  Stay away from secondhand smoke.  Stay up to date on all immunizations, including the yearly (annual) flu vaccine.  Keep all follow-up visits as told by your health   care provider. This is important. How to prevent the spread of infection to others   URIs can be passed from person to person (are contagious). To prevent the infection from spreading: ? Wash your hands often with soap and water. If soap and water are not available, use hand sanitizer. ? Avoid touching your mouth, face, eyes, or nose. ? Cough or sneeze into a tissue or your sleeve or elbow instead of into your hand  or into the air. Contact a health care provider if:  You are getting worse instead of better.  You have a fever or chills.  Your mucus is brown or red.  You have yellow or brown discharge coming from your nose.  You have pain in your face, especially when you bend forward.  You have swollen neck glands.  You have pain while swallowing.  You have white areas in the back of your throat. Get help right away if:  You have shortness of breath that gets worse.  You have severe or persistent: ? Headache. ? Ear pain. ? Sinus pain. ? Chest pain.  You have chronic lung disease along with any of the following: ? Wheezing. ? Prolonged cough. ? Coughing up blood. ? A change in your usual mucus.  You have a stiff neck.  You have changes in your: ? Vision. ? Hearing. ? Thinking. ? Mood. Summary  An upper respiratory infection (URI) is a common infection of the nose, throat, and upper air passages that lead to the lungs.  A URI is caused by a virus.  URIs usually get better on their own within 7-10 days.  Medicines cannot cure URIs, but your health care provider may recommend certain medicines to help relieve symptoms. This information is not intended to replace advice given to you by your health care provider. Make sure you discuss any questions you have with your health care provider. Document Released: 05/21/2001 Document Revised: 07/11/2017 Document Reviewed: 07/11/2017 Elsevier Interactive Patient Education  2019 Elsevier Inc.    

## 2019-01-21 NOTE — Progress Notes (Signed)
Location:  [facility] clinic Provider: Aariyana Manz L. Mariea Clonts, D.O., C.M.D.  Goals of Care:  Advanced Directives 11/27/2018  Does Patient Have a Medical Advance Directive? Yes  Type of Advance Directive Living will  Does patient want to make changes to medical advance directive? No - Patient declined  Copy of [place] in Chart? -  Would patient like information on creating a medical advance directive? -     Chief Complaint  Patient presents with  . Acute Visit    SORE THROAT    HPI: Patient is a 85 y.o. female seen today for an acute visit for sore throat and reportedly daughter was diagnosed with strep over the weekend.    Pt had shot in shoulder Thursday.  Pt's other daughter says she had strep dx Friday.    Tuesday pt was clearing throat and could not speak today.  Is quite hoarse.  No fever, chills.    Past Medical History:  Diagnosis Date  . Abnormal weight loss 05/28/2004  . Anxiety 01/17/2003  . Back pain 08/19/2005   with radiculopathy  . Bradycardia, drug induced    Diltiazem  . Cerebral atherosclerosis 09/24/1999  . Chest pain, atypical 04/2016   minor CAD at cath   . CHF (congestive heart failure) ([place])   . Chronic anticoagulation    Eliquis  . Dementia ([place])    "don't know kind or stage" (05/02/2016)  . Depression 09/06/2002  . Diverticulitis 09/28/2007  . External hemorrhoids 04/12/2009  . Female climacteric state 03/04/2000  . 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 GERD (gastroesophageal reflux disease)   . Hypertensive cardiovascular disease    moderate LVH on echo march 2017  . Macular degeneration   . Malaise and fatigue   . Neurocysticercosis    Craniotomy at [facility] in early 2000s  . Osteoarthrosis, localized   . Osteoporosis 05/28/2004  . Paranoia ([place])   . Paroxysmal A-fib ([place])   . TIA (transient ischemic attack) 2000s?  [name] Urinary incontinence 08/18/2007  . UTI (urinary tract infection)     . Vaginitis, atrophic   . Vertigo, peripheral 10/07/2000    Past Surgical History:  Procedure Laterality Date  . BRAIN SURGERY  2000   Neurocysticercosis ("parasite")  . CARDIAC CATHETERIZATION N/A 05/03/2016   Procedure: Left Heart Cath and Coronary Angiography;  Surgeon: Sherren Mocha, MD;  Location: [place] [facility];  Service: Cardiovascular;  Laterality: N/A;  . CARDIOVERSION N/A 07/14/2017   Procedure: CARDIOVERSION;  Surgeon: Pixie Casino, MD;  Location: [facility] ENDOSCOPY;  Service: Cardiovascular;  Laterality: N/A;  . CATARACT EXTRACTION  2013  . CATARACT EXTRACTION, BILATERAL     "not sure if both eyes; feel like it probably was"  . JOINT REPLACEMENT    . PERIPHERAL VASCULAR CATHETERIZATION N/A 05/03/2016   Procedure: Abdominal Aortogram;  Surgeon: Sherren Mocha, MD;  Location: [place] [facility];  Service: Cardiovascular;  Laterality: N/A;  . SHOULDER ARTHROSCOPY W/ ROTATOR CUFF REPAIR Right X 3  . TEE WITHOUT CARDIOVERSION N/A 07/14/2017   Procedure: TRANSESOPHAGEAL ECHOCARDIOGRAM (TEE);  Surgeon: Pixie Casino, MD;  Location: [place];  Service: Cardiovascular;  Laterality: N/A;  . TOTAL KNEE ARTHROPLASTY Left 1990  . VAGINAL HYSTERECTOMY  1960    Allergies  Allergen Reactions  . Tape Rash and Other (See Comments)    TEARS THE SKIN!!  . Bactrim [Sulfamethoxazole-Trimethoprim] Nausea Only  . Amiodarone Itching  . Amoxicillin Other (See  Comments)    Unknown- ? Upset stomach  . Demerol [Meperidine] Other (See Comments)    Hallucinations  . Hydralazine Other (See Comments)    Tingling and chest pain   . Lisinopril Cough  . Zoloft [Sertraline Hcl] Other (See Comments)    Unknown    Outpatient Encounter Medications as of 01/21/2019  Medication Sig  . acetaminophen (TYLENOL) 325 MG tablet Take 650 mg by mouth every 6 (six) hours as needed for mild pain.  [name] amLODipine (NORVASC) 10 MG tablet Take 1 tablet (10 mg total) by mouth daily.  . Cholecalciferol (VITAMIN  D3) 2000 units TABS Take 2,000 Units by mouth daily.   [name] docusate sodium (COLACE) 100 MG capsule Take 100 mg by mouth every other day.   . donepezil (ARICEPT) 10 MG tablet Take 1 tablet (10 mg total) by mouth daily.  [name] ELIQUIS 2.5 MG TABS tablet TAKE 1 TABLET TWICE DAILY  . furosemide (LASIX) 40 MG tablet Take 40 mg by mouth daily.  [name] losartan (COZAAR) 25 MG tablet TAKE 1 TABLET EVERY DAY  . metoprolol tartrate (LOPRESSOR) 25 MG tablet Take 1 tablet (25 mg total) by mouth 2 (two) times daily.  [name] venlafaxine (EFFEXOR) 75 MG tablet TAKE 1 TABLET TWICE DAILY   No facility-administered encounter medications on file as of 01/21/2019.     Review of Systems:  Review of Systems  Constitutional: Positive for malaise/fatigue. Negative for chills and fever.  HENT: Positive for congestion and hearing loss. Negative for sinus pain and sore throat.        Hoarse, does not say throat hurts  Eyes: Negative for blurred vision.  Respiratory: Positive for cough and wheezing. Negative for sputum production and shortness of breath.        Wheezing per daughter  Cardiovascular: Negative for chest pain, palpitations and leg swelling.  Gastrointestinal: Positive for heartburn. Negative for abdominal pain, blood in stool, constipation and melena.  Genitourinary: Negative for dysuria.  Musculoskeletal: Negative for falls.  Skin: Negative for itching and rash.  Neurological: Negative for dizziness, loss of consciousness and headaches.  Endo/Heme/Allergies: Bruises/bleeds easily.  Psychiatric/Behavioral: Positive for memory loss. Negative for depression.    Health Maintenance  Topic Date Due  . TETANUS/TDAP  09/08/2022  . INFLUENZA VACCINE  Completed  . DEXA SCAN  Completed  . PNA vac Low Risk Adult  Completed    Physical Exam: Vitals:   01/21/19 1025  BP: 140/70  Temp: 97.9 F (36.6 C)  TempSrc: Oral  Weight: 134 lb (60.8 kg)  Height: 5\' 1"  (1.549 m)   Body mass index is 25.32 kg/m. Physical  Exam Vitals signs reviewed.  Constitutional:      General: She is not in acute distress.    Appearance: Normal appearance. She is not ill-appearing or toxic-appearing.  HENT:     Head: Normocephalic and atraumatic.     Right Ear: Tympanic membrane, ear canal and external ear normal.     Left Ear: Tympanic membrane, ear canal and external ear normal.     Nose: Nose normal. No congestion.     Mouth/Throat:     Pharynx: Oropharynx is clear. No oropharyngeal exudate or posterior oropharyngeal erythema.  Eyes:     Extraocular Movements: Extraocular movements intact.     Conjunctiva/sclera: Conjunctivae normal.     Pupils: Pupils are equal, round, and reactive to light.  Neck:     Musculoskeletal: Neck supple. No muscular tenderness.  Cardiovascular:     Rate and Rhythm:  Normal rate and regular rhythm.     Pulses: Normal pulses.  Pulmonary:     Effort: Pulmonary effort is normal. No respiratory distress.     Breath sounds: Normal breath sounds. No wheezing.  Abdominal:     General: Bowel sounds are normal.  Musculoskeletal: Normal range of motion.  Lymphadenopathy:     Cervical: No cervical adenopathy.  Skin:    General: Skin is warm and dry.  Neurological:     General: No focal deficit present.     Mental Status: She is alert. Mental status is at baseline.     Labs reviewed: Basic Metabolic Panel: Recent Labs    11/26/18 1144 11/26/18 2055 11/27/18 0532 11/28/18 1359  NA 137 136 138  --   K 3.9 3.3* 3.2* 3.6  CL 101 100 103  --   CO2 24 21* 24  --   GLUCOSE 113 137* 96  --   BUN 28* 24* 19  --   CREATININE 1.55* 1.50* 1.49*  --   CALCIUM 9.5 9.1 8.7*  --   TSH 3.99  --   --   --    Liver Function Tests: Recent Labs    11/26/18 2055 11/27/18 0532  AST 27 109*  ALT 21 63*  ALKPHOS 72 121  BILITOT 0.9 1.6*  PROT 7.1 6.2*  ALBUMIN 3.3* 2.8*   Recent Labs    11/26/18 2055  LIPASE 37   No results for input(s): AMMONIA in the last 8760 hours. CBC: Recent  Labs    02/26/18 1042 11/26/18 1144 11/26/18 2055 11/27/18 0532  WBC 5.4 8.3 9.0 6.4  NEUTROABS 3,883 6,499 6.9  --   HGB 11.3* 11.1* 10.5* 9.8*  HCT 34.2* 32.6* 32.6* 30.7*  MCV 89.8 90.1 95.9 94.5  PLT 287 332 323 273   Lipid Panel: No results for input(s): CHOL, HDL, LDLCALC, TRIG, CHOLHDL, LDLDIRECT in the last 8760 hours. Lab Results  Component Value Date   HGBA1C 6.2 (H) 05/02/2016    Procedures since last visit: No results found.  Assessment/Plan 1. Viral URI with cough -no evidence to suggest she has strep--no exudates or erythema of throat and pt does not actually say her throat is sore, she is hoarse and coughing a bit, lungs clear w/o wheezing -recommended conservative therapy with hydration, rest, mucinex dm if goes into bronchi, warm humidity, saline nasal spray gargles  Labs/tests ordered:  No orders of the defined types were placed in this encounter.  Next appt:  01/28/2019  Raushanah Osmundson L. Nanette Wirsing, D.O. [facility] [address], [place] [address] Cell Phone (Mon-Fri 8am-5pm):  [phone] On Call:  [phone] & follow prompts after 5pm & weekends Office Phone:  [phone] Office Fax:  [phone]

## 2019-01-25 ENCOUNTER — Telehealth: Payer: Self-pay | Admitting: *Deleted

## 2019-01-25 NOTE — Telephone Encounter (Signed)
Left message for patient's daughter that no lab appt is needed, pt had labs in december at the hospital. Advised daughter to call if any questions

## 2019-01-28 ENCOUNTER — Other Ambulatory Visit: Payer: Medicare Other

## 2019-02-01 ENCOUNTER — Ambulatory Visit: Payer: Medicare Other | Admitting: Internal Medicine

## 2019-02-01 ENCOUNTER — Telehealth: Payer: Self-pay | Admitting: *Deleted

## 2019-02-01 NOTE — Telephone Encounter (Signed)
Brittney Gutierrez, daughter called and stated that patient was seen on the 13th and no better. Congestion , Hoarseness and sore throat. No Fever.   I asked daughter if patient tried the recommendations from Dr. Mariea Gutierrez and they did NOT try the Mucinex DM or nasal spray.   Assessment/Plan 1. Viral URI with cough -no evidence to suggest she has strep--no exudates or erythema of throat and pt does not actually say her throat is sore, she is hoarse and coughing a bit, lungs clear w/o wheezing -recommended conservative therapy with hydration, rest, mucinex dm if goes into bronchi, warm humidity, saline nasal spray gargles   Daughter stated that she will try the Mucinex and spray. Will call back if no better in a couple of days or if any new symptoms occur.

## 2019-03-03 ENCOUNTER — Other Ambulatory Visit: Payer: Self-pay | Admitting: Internal Medicine

## 2019-03-03 DIAGNOSIS — I10 Essential (primary) hypertension: Secondary | ICD-10-CM

## 2019-03-04 ENCOUNTER — Other Ambulatory Visit: Payer: Self-pay | Admitting: Internal Medicine

## 2019-03-04 ENCOUNTER — Telehealth: Payer: Self-pay | Admitting: *Deleted

## 2019-03-04 DIAGNOSIS — F0280 Dementia in other diseases classified elsewhere without behavioral disturbance: Secondary | ICD-10-CM

## 2019-03-04 DIAGNOSIS — G301 Alzheimer's disease with late onset: Principal | ICD-10-CM

## 2019-03-04 MED ORDER — CIPROFLOXACIN HCL 500 MG PO TABS: 500.0000 mg | ORAL_TABLET | Freq: Two times a day (BID) | ORAL | 0 refills | Status: DC

## 2019-03-04 NOTE — Telephone Encounter (Signed)
Daughter called back and stated that a nurse at the facility checked on her mother today and stated that she does not feel like it is her dementia. Daughter stated to check back in her records and you can see she had to go to the hospital and they diagnosed her with a UTI. Stated she knows her mother.  Daughter stated that patient has her good and bad days and she is on her bad day roll right now because she thinks she has a UTI not quarantine.  Daughter stated that she DOES NOT want her to end up in the hospital and if she continues down this road that is where she is headed. Daughter wants an antibiotic called into the pharmacy today. Stated that if she sees it is not helping her after a couple of days, she will stop it. Please Advise.

## 2019-03-04 NOTE — Progress Notes (Signed)
cipro was sent to [facility].  No need to call back tomorrow at noon.  Call back if no better after antibiotics completed.

## 2019-03-04 NOTE — Telephone Encounter (Signed)
Cipro sent in to pharmacy.  Push fluids as previously advised--it will help more than anything else.  She's probably dehydrated.  Call back if not better after taking ALL of the antibiotics.  Do not give a partial course.

## 2019-03-04 NOTE — Telephone Encounter (Signed)
Daughter, [name] called and stated that patient is showing symptoms of a UTI. Stated that the facility where patient lives, [facility], has them on lock down. Stated that if she takes patient out then she has to be Quartined to her room for 2  Weeks.  Daughter is wondering if an antibiotic could be called in for preventive measures.   Patient called c/o possible UrinaryTract Infection (UTI)  1. What symptoms are you having (frequency, urgency, dysuria, incontinence, confusion)? Confusion. Daughter stated same symptoms as before when patient got a UTI.   2. Any fever or chills? No Fever, No chills  3. Any suprapubic pain? Patient hasn't complained of any  4. Have you taken anything OTC for symptoms? No   5. How much water are you drinking daily? Daughter stated she is good at drinking her water  6. How long have you had your symptoms (onset)? Since Monday, the confusion started.   [facility]: [facility]  I will forward this information to your provider and call with instructions, if your symptoms persist or progress seek medical attention at your nearest urgent care or emergency room. Patient verbalized understanding.

## 2019-03-04 NOTE — Telephone Encounter (Signed)
Spoke with daughter and advised results.  

## 2019-03-04 NOTE — Telephone Encounter (Signed)
Of note, the majority of the time that she has had confusion, the urine samples have not actually grown out bacteria consistent with UTIs.  Her dementia does have oscillating confusion with good and bad days.  These folks are not tolerating the changes to their routines well with the quarantine.    I recommend she drink 8 8oz glasses of water (64 total oz of water) per day and one glass of cranberry juice daily.  Let's do this for the next 24 hours and see how she is doing.  If, by tomorrow at noon, she is still "more confused" they should call back.  They should not wait until later than noon b/c we need to be sure that there is enough time to address the call.

## 2019-04-04 ENCOUNTER — Encounter: Payer: Self-pay | Admitting: Internal Medicine

## 2019-04-16 ENCOUNTER — Ambulatory Visit: Payer: Medicare Other | Admitting: Nurse Practitioner

## 2019-04-16 ENCOUNTER — Other Ambulatory Visit: Payer: Self-pay

## 2019-04-16 DIAGNOSIS — N3000 Acute cystitis without hematuria: Secondary | ICD-10-CM | POA: Diagnosis not present

## 2019-04-16 DIAGNOSIS — R350 Frequency of micturition: Secondary | ICD-10-CM | POA: Diagnosis not present

## 2019-05-04 ENCOUNTER — Encounter: Payer: Self-pay | Admitting: Family

## 2019-05-04 ENCOUNTER — Other Ambulatory Visit: Payer: Self-pay

## 2019-05-04 ENCOUNTER — Ambulatory Visit (INDEPENDENT_AMBULATORY_CARE_PROVIDER_SITE_OTHER): Payer: Medicare Other | Admitting: Family

## 2019-05-04 VITALS — BP 100/68 | HR 75 | Temp 97.4°F | Ht 61.0 in | Wt 125.0 lb

## 2019-05-04 DIAGNOSIS — R2681 Unsteadiness on feet: Secondary | ICD-10-CM | POA: Diagnosis not present

## 2019-05-04 DIAGNOSIS — R634 Abnormal weight loss: Secondary | ICD-10-CM | POA: Diagnosis not present

## 2019-05-04 DIAGNOSIS — R531 Weakness: Secondary | ICD-10-CM | POA: Diagnosis not present

## 2019-05-04 DIAGNOSIS — R630 Anorexia: Secondary | ICD-10-CM | POA: Diagnosis not present

## 2019-05-04 LAB — POCT URINALYSIS DIPSTICK
Bilirubin, UA: NEGATIVE
Blood, UA: NEGATIVE
Glucose, UA: NEGATIVE
Ketones, UA: NEGATIVE
Leukocytes, UA: NEGATIVE
Nitrite, UA: NEGATIVE
Protein, UA: NEGATIVE
Spec Grav, UA: >=1.03 — AB (ref 1.010–1.025)
Urobilinogen, UA: 0.2 E.U./dL
pH, UA: 6 (ref 5.0–8.0)

## 2019-05-04 MED ORDER — MIRTAZAPINE 7.5 MG PO TABS: 7.5000 mg | ORAL_TABLET | Freq: Every day | ORAL | 0 refills | Status: DC

## 2019-05-04 NOTE — Progress Notes (Signed)
Provider: Madlyn Crosby FNP-C  Brittney Curry, DO  Patient Care Team: Brittney Curry, DO as PCP - General (Geriatric Medicine) Brittney Klein, MD as PCP - Cardiology (Cardiology) Brittney Stalls, MD as Consulting Physician (Ophthalmology) Brittney Hopper, MD as Referring Physician (Student)  Extended Emergency Contact Information Primary Emergency Contact: Brittney Gutierrez,Brittney Gutierrez Address: [address]          [place], Brittney Gutierrez Brittney Gutierrez [place] Phone: [phone] Relation: Daughter Secondary Emergency Contact: Brittney Gutierrez [place] Phone: [phone] Work Phone: [phone] Mobile Phone: [phone] Relation: Daughter   Goals of care: Advanced Directive information Advanced Directives 11/27/2018  Does Patient Have a Medical Advance Directive? Yes  Type of Advance Directive Living will  Does patient want to make changes to medical advance directive? No - Patient declined  Copy of Brittney Gutierrez in Chart? -  Would patient like information on creating a medical advance directive? -     Chief Complaint  Patient presents with  . Acute Visit    Not eating, lost weight. Daughter said she complains of diarrhea. Patient cannot give any specific abdominal pain.     HPI:  Pt is a 85 y.o. female seen today for an acute visit for evaluation of loss of weight and not eating well.she is here today with her daughter.she is on a wheelchair.Daughter states usually walks around on walker that has a seat.she resides in independent living apartment at [facility].Patient's daughter states for the past two weeks patient has been weak,not getting out of the bed and does not eat her meals when it's delivered to her apartment.Daughter states has not seen patient for the past two weeks because she was not getting out of bed to be brought outside for family to see due to COVID-19 restrictions.Patient'd daughter had to gown up and was able to  visit with patient on Saturday.she was able to feed her.Patient states has felt more depressed.Per daughter cries at times and easily agitated.she denies any fever,chills,cough or signs of urinary tract infections.also denies any fall episodes.    Past Medical History:  Diagnosis Date  . Abnormal weight loss 05/28/2004  . Anxiety 01/17/2003  . Back pain 08/19/2005   with radiculopathy  . Bradycardia, drug induced    Diltiazem  . Cerebral atherosclerosis 09/24/1999  . Chest pain, atypical 04/2016   minor CAD at cath   . CHF (congestive heart failure) ([place])   . Chronic anticoagulation    Eliquis  . Dementia ([place])    "don't know kind or stage" (05/02/2016)  . Depression 09/06/2002  . Diverticulitis 09/28/2007  . External hemorrhoids 04/12/2009  . Female climacteric state 03/04/2000  . 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 GERD (gastroesophageal reflux disease)   . Hypertensive cardiovascular disease    moderate LVH on echo march 2017  . Macular degeneration   . Malaise and fatigue   . 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 Osteoarthrosis, localized   . Osteoporosis 05/28/2004  . Paranoia ([place])   . Paroxysmal A-fib ([place])   . TIA (transient ischemic attack) 2000s?  [name] Urinary incontinence 08/18/2007  . UTI (urinary tract infection)   . Vaginitis, atrophic   . Vertigo, peripheral 10/07/2000   Past Surgical History:  Procedure Laterality Date  . BRAIN SURGERY  2000   Neurocysticercosis ("parasite")  . CARDIAC CATHETERIZATION N/A 05/03/2016   Procedure: Left Heart  Cath and Coronary Angiography;  Surgeon: Sherren Mocha, MD;  Location: [place] [facility];  Service: Cardiovascular;  Laterality: N/A;  . CARDIOVERSION N/A 07/14/2017   Procedure: CARDIOVERSION;  Surgeon: Pixie Casino, MD;  Location: [facility] ENDOSCOPY;  Service: Cardiovascular;  Laterality: N/A;  . CATARACT EXTRACTION  2013  . CATARACT EXTRACTION, BILATERAL      "not sure if both eyes; feel like it probably was"  . JOINT REPLACEMENT    . PERIPHERAL VASCULAR CATHETERIZATION N/A 05/03/2016   Procedure: Abdominal Aortogram;  Surgeon: Sherren Mocha, MD;  Location: [place];  Service: Cardiovascular;  Laterality: N/A;  . SHOULDER ARTHROSCOPY W/ ROTATOR CUFF REPAIR Right X 3  . TEE WITHOUT CARDIOVERSION N/A 07/14/2017   Procedure: TRANSESOPHAGEAL ECHOCARDIOGRAM (TEE);  Surgeon: Pixie Casino, MD;  Location: [facility];  Service: Cardiovascular;  Laterality: N/A;  . TOTAL KNEE ARTHROPLASTY Left 1990  . VAGINAL HYSTERECTOMY  1960    Allergies  Allergen Reactions  . Tape Rash and Other (See Comments)    TEARS THE SKIN!!  . Bactrim [Sulfamethoxazole-Trimethoprim] Nausea Only  . Amiodarone Itching  . Amoxicillin Other (See Comments)    Unknown- ? Upset stomach  . Demerol [Meperidine] Other (See Comments)    Hallucinations  . Hydralazine Other (See Comments)    Tingling and chest pain   . Lisinopril Cough  . Zoloft [Sertraline Hcl] Other (See Comments)    Unknown    Outpatient Encounter Medications as of 05/04/2019  Medication Sig  . acetaminophen (TYLENOL) 325 MG tablet Take 650 mg by mouth every 6 (six) hours as needed for mild pain.  [name] amLODipine (NORVASC) 10 MG tablet TAKE 1 TABLET (10 MG TOTAL) BY MOUTH DAILY.  [name] Cholecalciferol (VITAMIN D3) 2000 units TABS Take 2,000 Units by mouth daily.   [name] docusate sodium (COLACE) 100 MG capsule Take 100 mg by mouth every other day.   . donepezil (ARICEPT) 10 MG tablet Take 1 tablet (10 mg total) by mouth daily.  [name] ELIQUIS 2.5 MG TABS tablet TAKE 1 TABLET TWICE DAILY  . furosemide (LASIX) 40 MG tablet Take 40 mg by mouth daily.  [name] losartan (COZAAR) 25 MG tablet TAKE 1 TABLET EVERY DAY  . metoprolol tartrate (LOPRESSOR) 25 MG tablet Take 1 tablet (25 mg total) by mouth 2 (two) times daily.  [name] venlafaxine (EFFEXOR) 75 MG tablet TAKE 1 TABLET TWICE DAILY  . [DISCONTINUED] ciprofloxacin  (CIPRO) 500 MG tablet Take 1 tablet (500 mg total) by mouth 2 (two) times daily.   No facility-administered encounter medications on file as of 05/04/2019.     Review of Systems  Constitutional: Positive for appetite change, fatigue and unexpected weight change. Negative for chills and fever.  HENT: Positive for hearing loss. Negative for congestion, rhinorrhea, sinus pressure, sinus pain, sneezing and sore throat.   Eyes: Negative for discharge, redness and itching.  Respiratory: Negative for cough, chest tightness, shortness of breath and wheezing.   Cardiovascular: Negative for chest pain, palpitations and leg swelling.  Gastrointestinal: Negative for abdominal distention, abdominal pain, constipation, nausea and vomiting.       Had diarrhea x 1 on Saturday but none since then   Endocrine: Negative for cold intolerance, heat intolerance, polydipsia, polyphagia and polyuria.  Genitourinary: Negative for difficulty urinating, dysuria, flank pain, frequency and urgency.  Musculoskeletal: Positive for gait problem.  Skin: Negative for color change, pallor and rash.  Neurological: Negative for dizziness, light-headedness and headaches.       Generalized weakness  Psychiatric/Behavioral: Negative for sleep disturbance. The patient is not nervous/anxious.        Increased depression symptoms    Immunization History  Administered Date(s) Administered  . Influenza Split 11/02/1999, 10/18/2000, 10/22/2001, 09/18/2002, 09/23/2003, 09/22/2004, 10/10/2005, 09/18/2006, 10/19/2007, 09/21/2008  . Influenza Whole 10/09/2009, 09/21/2010, 10/04/2011, 09/04/2012, 09/09/2013  . Influenza, High Dose Seasonal PF 09/23/2014, 08/30/2015, 09/29/2017, 10/01/2018  . Influenza,inj,Quad PF,6+ Mos 09/19/2016  . Influenza,inj,quad, With Preservative 09/08/2017  . Pneumococcal Conjugate-13 10/24/2015  . Pneumococcal-Unspecified 10/19/2007, 12/09/2014  . Tdap 05/14/2012, 09/08/2012  . Zoster 10/14/2012    Pertinent  Health Maintenance Due  Topic Date Due  . INFLUENZA VACCINE  07/10/2019  . DEXA SCAN  Completed  . PNA vac Low Risk Adult  Completed   Fall Risk  11/26/2018 10/01/2018 05/29/2018 03/02/2018 12/22/2017  Falls in the past year? 1 No No No No  Number falls in past yr: 1 - - - -  Injury with Fall? 1 - - - -  Comment possible left leg injury  - - - -    Vitals:   05/04/19 1111  BP: 100/68  Pulse: 75  Temp: (!) 97.4 F (36.3 C)  TempSrc: Oral  SpO2: 96%  Weight: 125 lb (56.7 kg)  Height: '5\' 1"'  (1.549 m)   Body mass index is 23.62 kg/m. Physical Exam Vitals signs reviewed.  Constitutional:      General: She is not in acute distress.    Appearance: She is normal weight. She is not ill-appearing.  HENT:     Head: Normocephalic.     Nose: No congestion or rhinorrhea.     Mouth/Throat:     Mouth: Mucous membranes are moist.     Pharynx: Oropharynx is clear. No oropharyngeal exudate or posterior oropharyngeal erythema.  Eyes:     General: No scleral icterus.       Right eye: No discharge.     Conjunctiva/sclera: Conjunctivae normal.  Cardiovascular:     Rate and Rhythm: Normal rate and regular rhythm.     Pulses: Normal pulses.     Heart sounds: Normal heart sounds. No murmur. No friction rub. No gallop.   Pulmonary:     Effort: Pulmonary effort is normal. No respiratory distress.     Breath sounds: Normal breath sounds. No wheezing, rhonchi or rales.  Chest:     Chest wall: No tenderness.  Abdominal:     General: Bowel sounds are normal. There is no distension.     Palpations: Abdomen is soft. There is no mass.     Tenderness: There is no abdominal tenderness. There is no right CVA tenderness, left CVA tenderness, guarding or rebound.  Musculoskeletal:        General: No swelling or tenderness.     Right lower leg: No edema.     Left lower leg: No edema.     Comments: Moves x 4 extremities.unsteady gait on wheelchair during visit.Generalized weakness   Skin:     General: Skin is warm and dry.     Coloration: Skin is not pale.     Findings: No bruising, erythema or rash.  Neurological:     Mental Status: She is alert. Mental status is at baseline.     Cranial Nerves: No cranial nerve deficit.     Sensory: No sensory deficit.     Gait: Gait abnormal.  Psychiatric:        Mood and Affect: Mood is depressed.        Speech: Speech normal.  Behavior: Behavior is agitated. Behavior is cooperative.        Thought Content: Thought content normal.        Cognition and Memory: Memory is impaired.    Labs reviewed: Recent Labs    11/26/18 1144 11/26/18 2055 11/27/18 0532 11/28/18 1359  NA 137 136 138  --   K 3.9 3.3* 3.2* 3.6  CL 101 100 103  --   CO2 24 21* 24  --   GLUCOSE 113 137* 96  --   BUN 28* 24* 19  --   CREATININE 1.55* 1.50* 1.49*  --   CALCIUM 9.5 9.1 8.7*  --    Recent Labs    11/26/18 2055 11/27/18 0532  AST 27 109*  ALT 21 63*  ALKPHOS 72 121  BILITOT 0.9 1.6*  PROT 7.1 6.2*  ALBUMIN 3.3* 2.8*   Recent Labs    11/26/18 1144 11/26/18 2055 11/27/18 0532  WBC 8.3 9.0 6.4  NEUTROABS 6,499 6.9  --   HGB 11.1* 10.5* 9.8*  HCT 32.6* 32.6* 30.7*  MCV 90.1 95.9 94.5  PLT 332 323 273   Lab Results  Component Value Date   TSH 3.99 11/26/2018   Lab Results  Component Value Date   HGBA1C 6.2 (H) 05/02/2016   Lab Results  Component Value Date   CHOL 195 05/02/2016   HDL 65 05/02/2016   LDLCALC 120 (H) 05/02/2016   TRIG 49 05/02/2016   CHOLHDL 3.0 05/02/2016    Significant Diagnostic Results in last 30 days:  No results found.  Assessment/Plan 1. Poor appetite Has had a 9 lbs weight loss over three months.Suspect poor oral intake related to COVID -19 restrictions unable to go to the dinning areas as usual resulting to depressive symptoms.will rule out infectious and metabolic etiologies.  - start on low dose of Remeron 7.5 mg tablet one by mouth daily at bedtime for appetite. - CBC with  Differential/Platelet - POC Urinalysis Dipstick - Urine Culture  2. Weight loss 9 lbs weight loss over three months.Possible due to poor oral intake and depression.will start on low dose of Remeron 7.5 mg tablet one by mouth daily at bedtime for appetite.  - CBC with Differential/Platelet - TSH - CMP with eGFR([facility]) - POC Urinalysis Dipstick - Urine Culture  3. Generalized weakness Afebrile.possible due to poor oral intake.encourage hydration.daughter states in the process of getting a seater to assist with her ADL's.  - CBC with Differential/Platelet - CMP with eGFR([facility]) - Ambulatory referral to [place] Urinalysis Dipstick - Urine Culture  4. Unsteady gait No fall episodes. - Ambulatory referral to [facility] staff Communication: Reviewed plan of care with patient and daughter  Labs/tests ordered:  - CBC with Differential/Platelet - CMP with eGFR([facility]) - Ambulatory referral to [place] - POC Urinalysis Dipstick - Urine Culture  Next appt : 4 weeks for follow up weight loss   Sandrea Hughs, NP

## 2019-05-05 ENCOUNTER — Other Ambulatory Visit: Payer: Medicare Other

## 2019-05-05 ENCOUNTER — Telehealth: Payer: Self-pay | Admitting: *Deleted

## 2019-05-05 NOTE — Telephone Encounter (Signed)
Brittney Gutierrez, daughter called and stated that patient was seen yesterday and prescribed Mirtazapine for her appetite. Stated that today patient is very verbally abusive and not wanting to get out of bed and very combative. Has only had one pill but daughter stated that patient is very sensitive to medications and wonders if this is a side effect.  Please Advise.

## 2019-05-05 NOTE — Telephone Encounter (Signed)
Patient daughter notified and agreed.  Medication list updated.  

## 2019-05-05 NOTE — Telephone Encounter (Signed)
May discontinue Remeron 7.5 mg tablet if medication is making patient verbally abusive and combative though this could be more of dementia symptoms rather than the medication.

## 2019-05-06 LAB — URINE CULTURE
MICRO NUMBER:: 505641
SPECIMEN QUALITY:: ADEQUATE

## 2019-05-10 DIAGNOSIS — M6281 Muscle weakness (generalized): Secondary | ICD-10-CM | POA: Diagnosis not present

## 2019-05-10 DIAGNOSIS — R2689 Other abnormalities of gait and mobility: Secondary | ICD-10-CM | POA: Diagnosis not present

## 2019-05-11 DIAGNOSIS — R2689 Other abnormalities of gait and mobility: Secondary | ICD-10-CM | POA: Diagnosis not present

## 2019-05-11 DIAGNOSIS — M6281 Muscle weakness (generalized): Secondary | ICD-10-CM | POA: Diagnosis not present

## 2019-05-12 DIAGNOSIS — R2689 Other abnormalities of gait and mobility: Secondary | ICD-10-CM | POA: Diagnosis not present

## 2019-05-12 DIAGNOSIS — M6281 Muscle weakness (generalized): Secondary | ICD-10-CM | POA: Diagnosis not present

## 2019-05-13 ENCOUNTER — Encounter (HOSPITAL_COMMUNITY): Payer: Self-pay | Admitting: Pharmacy Technician

## 2019-05-13 ENCOUNTER — Inpatient Hospital Stay (HOSPITAL_COMMUNITY)
Admission: EM | Admit: 2019-05-13 | Discharge: 2019-06-09 | DRG: 377 | Disposition: E | Payer: Medicare Other | Attending: Internal Medicine | Admitting: Internal Medicine

## 2019-05-13 ENCOUNTER — Other Ambulatory Visit: Payer: Self-pay

## 2019-05-13 ENCOUNTER — Emergency Department (HOSPITAL_COMMUNITY): Payer: Medicare Other

## 2019-05-13 DIAGNOSIS — I959 Hypotension, unspecified: Secondary | ICD-10-CM | POA: Diagnosis present

## 2019-05-13 DIAGNOSIS — Z8249 Family history of ischemic heart disease and other diseases of the circulatory system: Secondary | ICD-10-CM

## 2019-05-13 DIAGNOSIS — I5033 Acute on chronic diastolic (congestive) heart failure: Secondary | ICD-10-CM | POA: Diagnosis present

## 2019-05-13 DIAGNOSIS — K219 Gastro-esophageal reflux disease without esophagitis: Secondary | ICD-10-CM | POA: Diagnosis present

## 2019-05-13 DIAGNOSIS — Z880 Allergy status to penicillin: Secondary | ICD-10-CM

## 2019-05-13 DIAGNOSIS — Z888 Allergy status to other drugs, medicaments and biological substances status: Secondary | ICD-10-CM

## 2019-05-13 DIAGNOSIS — F419 Anxiety disorder, unspecified: Secondary | ICD-10-CM | POA: Diagnosis present

## 2019-05-13 DIAGNOSIS — I130 Hypertensive heart and chronic kidney disease with heart failure and stage 1 through stage 4 chronic kidney disease, or unspecified chronic kidney disease: Secondary | ICD-10-CM | POA: Diagnosis present

## 2019-05-13 DIAGNOSIS — F0390 Unspecified dementia without behavioral disturbance: Secondary | ICD-10-CM | POA: Diagnosis present

## 2019-05-13 DIAGNOSIS — N179 Acute kidney failure, unspecified: Secondary | ICD-10-CM | POA: Diagnosis present

## 2019-05-13 DIAGNOSIS — I4891 Unspecified atrial fibrillation: Secondary | ICD-10-CM | POA: Diagnosis not present

## 2019-05-13 DIAGNOSIS — I499 Cardiac arrhythmia, unspecified: Secondary | ICD-10-CM | POA: Diagnosis not present

## 2019-05-13 DIAGNOSIS — R52 Pain, unspecified: Secondary | ICD-10-CM | POA: Diagnosis not present

## 2019-05-13 DIAGNOSIS — I4510 Unspecified right bundle-branch block: Secondary | ICD-10-CM | POA: Diagnosis present

## 2019-05-13 DIAGNOSIS — R0902 Hypoxemia: Secondary | ICD-10-CM

## 2019-05-13 DIAGNOSIS — I495 Sick sinus syndrome: Secondary | ICD-10-CM | POA: Diagnosis present

## 2019-05-13 DIAGNOSIS — R740 Nonspecific elevation of levels of transaminase and lactic acid dehydrogenase [LDH]: Secondary | ICD-10-CM | POA: Diagnosis present

## 2019-05-13 DIAGNOSIS — Z9049 Acquired absence of other specified parts of digestive tract: Secondary | ICD-10-CM

## 2019-05-13 DIAGNOSIS — I480 Paroxysmal atrial fibrillation: Secondary | ICD-10-CM | POA: Diagnosis present

## 2019-05-13 DIAGNOSIS — J90 Pleural effusion, not elsewhere classified: Secondary | ICD-10-CM | POA: Diagnosis not present

## 2019-05-13 DIAGNOSIS — D62 Acute posthemorrhagic anemia: Secondary | ICD-10-CM | POA: Diagnosis present

## 2019-05-13 DIAGNOSIS — R195 Other fecal abnormalities: Secondary | ICD-10-CM | POA: Diagnosis not present

## 2019-05-13 DIAGNOSIS — Z90710 Acquired absence of both cervix and uterus: Secondary | ICD-10-CM

## 2019-05-13 DIAGNOSIS — Z1159 Encounter for screening for other viral diseases: Secondary | ICD-10-CM

## 2019-05-13 DIAGNOSIS — M810 Age-related osteoporosis without current pathological fracture: Secondary | ICD-10-CM | POA: Diagnosis present

## 2019-05-13 DIAGNOSIS — Z8673 Personal history of transient ischemic attack (TIA), and cerebral infarction without residual deficits: Secondary | ICD-10-CM

## 2019-05-13 DIAGNOSIS — M5410 Radiculopathy, site unspecified: Secondary | ICD-10-CM | POA: Diagnosis present

## 2019-05-13 DIAGNOSIS — D649 Anemia, unspecified: Secondary | ICD-10-CM | POA: Diagnosis not present

## 2019-05-13 DIAGNOSIS — M549 Dorsalgia, unspecified: Secondary | ICD-10-CM | POA: Diagnosis present

## 2019-05-13 DIAGNOSIS — R2689 Other abnormalities of gait and mobility: Secondary | ICD-10-CM | POA: Diagnosis not present

## 2019-05-13 DIAGNOSIS — R791 Abnormal coagulation profile: Secondary | ICD-10-CM | POA: Diagnosis present

## 2019-05-13 DIAGNOSIS — R58 Hemorrhage, not elsewhere classified: Secondary | ICD-10-CM | POA: Diagnosis not present

## 2019-05-13 DIAGNOSIS — R0602 Shortness of breath: Secondary | ICD-10-CM | POA: Diagnosis not present

## 2019-05-13 DIAGNOSIS — K5900 Constipation, unspecified: Secondary | ICD-10-CM | POA: Diagnosis present

## 2019-05-13 DIAGNOSIS — H3530 Unspecified macular degeneration: Secondary | ICD-10-CM | POA: Diagnosis present

## 2019-05-13 DIAGNOSIS — Z96652 Presence of left artificial knee joint: Secondary | ICD-10-CM | POA: Diagnosis present

## 2019-05-13 DIAGNOSIS — Z03818 Encounter for observation for suspected exposure to other biological agents ruled out: Secondary | ICD-10-CM | POA: Diagnosis not present

## 2019-05-13 DIAGNOSIS — F329 Major depressive disorder, single episode, unspecified: Secondary | ICD-10-CM | POA: Diagnosis present

## 2019-05-13 DIAGNOSIS — R197 Diarrhea, unspecified: Secondary | ICD-10-CM | POA: Diagnosis present

## 2019-05-13 DIAGNOSIS — Z66 Do not resuscitate: Secondary | ICD-10-CM | POA: Diagnosis present

## 2019-05-13 DIAGNOSIS — Z7901 Long term (current) use of anticoagulants: Secondary | ICD-10-CM

## 2019-05-13 DIAGNOSIS — I2510 Atherosclerotic heart disease of native coronary artery without angina pectoris: Secondary | ICD-10-CM | POA: Diagnosis present

## 2019-05-13 DIAGNOSIS — F0150 Vascular dementia without behavioral disturbance: Secondary | ICD-10-CM | POA: Diagnosis not present

## 2019-05-13 DIAGNOSIS — Z452 Encounter for adjustment and management of vascular access device: Secondary | ICD-10-CM | POA: Diagnosis not present

## 2019-05-13 DIAGNOSIS — Z8271 Family history of polycystic kidney: Secondary | ICD-10-CM

## 2019-05-13 DIAGNOSIS — N183 Chronic kidney disease, stage 3 (moderate): Secondary | ICD-10-CM | POA: Diagnosis present

## 2019-05-13 DIAGNOSIS — R918 Other nonspecific abnormal finding of lung field: Secondary | ICD-10-CM | POA: Diagnosis not present

## 2019-05-13 DIAGNOSIS — J9601 Acute respiratory failure with hypoxia: Secondary | ICD-10-CM | POA: Diagnosis present

## 2019-05-13 DIAGNOSIS — R748 Abnormal levels of other serum enzymes: Secondary | ICD-10-CM | POA: Diagnosis not present

## 2019-05-13 DIAGNOSIS — K922 Gastrointestinal hemorrhage, unspecified: Secondary | ICD-10-CM

## 2019-05-13 DIAGNOSIS — I701 Atherosclerosis of renal artery: Secondary | ICD-10-CM | POA: Diagnosis present

## 2019-05-13 DIAGNOSIS — M6281 Muscle weakness (generalized): Secondary | ICD-10-CM | POA: Diagnosis not present

## 2019-05-13 DIAGNOSIS — K921 Melena: Secondary | ICD-10-CM | POA: Diagnosis present

## 2019-05-13 DIAGNOSIS — Z79899 Other long term (current) drug therapy: Secondary | ICD-10-CM

## 2019-05-13 LAB — CBC WITH DIFFERENTIAL/PLATELET
Abs Immature Granulocytes: 0.04 10*3/uL (ref 0.00–0.07)
Basophils Absolute: 0 10*3/uL (ref 0.0–0.1)
Basophils Relative: 0 %
Eosinophils Absolute: 0 10*3/uL (ref 0.0–0.5)
Eosinophils Relative: 0 %
HCT: 37.3 % (ref 36.0–46.0)
Hemoglobin: 11.8 g/dL — ABNORMAL LOW (ref 12.0–15.0)
Immature Granulocytes: 0 %
Lymphocytes Relative: 7 %
Lymphs Abs: 0.7 10*3/uL (ref 0.7–4.0)
MCH: 30.1 pg (ref 26.0–34.0)
MCHC: 31.6 g/dL (ref 30.0–36.0)
MCV: 95.2 fL (ref 80.0–100.0)
Monocytes Absolute: 0.7 10*3/uL (ref 0.1–1.0)
Monocytes Relative: 8 %
Neutro Abs: 7.7 10*3/uL (ref 1.7–7.7)
Neutrophils Relative %: 85 %
Platelets: 219 10*3/uL (ref 150–400)
RBC: 3.92 MIL/uL (ref 3.87–5.11)
RDW: 15.5 % (ref 11.5–15.5)
WBC: 9.2 10*3/uL (ref 4.0–10.5)
nRBC: 0 % (ref 0.0–0.2)

## 2019-05-13 LAB — I-STAT CHEM 8, ED
BUN: 77 mg/dL — ABNORMAL HIGH (ref 8–23)
Calcium, Ion: 1.05 mmol/L — ABNORMAL LOW (ref 1.15–1.40)
Chloride: 103 mmol/L (ref 98–111)
Creatinine, Ser: 3.5 mg/dL — ABNORMAL HIGH (ref 0.44–1.00)
Glucose, Bld: 131 mg/dL — ABNORMAL HIGH (ref 70–99)
HCT: 37 % (ref 36.0–46.0)
Hemoglobin: 12.6 g/dL (ref 12.0–15.0)
Potassium: 3.6 mmol/L (ref 3.5–5.1)
Sodium: 135 mmol/L (ref 135–145)
TCO2: 20 mmol/L — ABNORMAL LOW (ref 22–32)

## 2019-05-13 LAB — I-STAT TROPONIN, ED: Troponin i, poc: 0.04 ng/mL (ref 0.00–0.08)

## 2019-05-13 LAB — COMPREHENSIVE METABOLIC PANEL
ALT: 129 U/L — ABNORMAL HIGH (ref 0–44)
AST: 33 U/L (ref 15–41)
Albumin: 3.5 g/dL (ref 3.5–5.0)
Alkaline Phosphatase: 93 U/L (ref 38–126)
Anion gap: 18 — ABNORMAL HIGH (ref 5–15)
BUN: 81 mg/dL — ABNORMAL HIGH (ref 8–23)
CO2: 18 mmol/L — ABNORMAL LOW (ref 22–32)
Calcium: 9.4 mg/dL (ref 8.9–10.3)
Chloride: 101 mmol/L (ref 98–111)
Creatinine, Ser: 3.41 mg/dL — ABNORMAL HIGH (ref 0.44–1.00)
GFR calc Af Amer: 13 mL/min — ABNORMAL LOW (ref >60)
GFR calc non Af Amer: 11 mL/min — ABNORMAL LOW (ref >60)
Glucose, Bld: 134 mg/dL — ABNORMAL HIGH (ref 70–99)
Potassium: 3.7 mmol/L (ref 3.5–5.1)
Sodium: 137 mmol/L (ref 135–145)
Total Bilirubin: 1.1 mg/dL (ref 0.3–1.2)
Total Protein: 7 g/dL (ref 6.5–8.1)

## 2019-05-13 LAB — ABO/RH: ABO/RH(D): A POS

## 2019-05-13 LAB — BPAM RBC
Blood Product Expiration Date: 202006192359
Unit Type and Rh: 6200

## 2019-05-13 LAB — SARS CORONAVIRUS 2 BY RT PCR (HOSPITAL ORDER, PERFORMED IN ~~LOC~~ HOSPITAL LAB): SARS Coronavirus 2: NEGATIVE

## 2019-05-13 LAB — TYPE AND SCREEN
ABO/RH(D): A POS
Antibody Screen: NEGATIVE
Unit division: 0

## 2019-05-13 LAB — PREPARE RBC (CROSSMATCH)

## 2019-05-13 LAB — APTT: aPTT: 33 seconds (ref 24–36)

## 2019-05-13 LAB — LACTIC ACID, PLASMA: Lactic Acid, Venous: 2.5 mmol/L (ref 0.5–1.9)

## 2019-05-13 LAB — HEPARIN LEVEL (UNFRACTIONATED): Heparin Unfractionated: >2.2 IU/mL — ABNORMAL HIGH (ref 0.30–0.70)

## 2019-05-13 LAB — LIPASE, BLOOD: Lipase: 43 U/L (ref 11–51)

## 2019-05-13 LAB — PROTIME-INR
INR: 1.9 — ABNORMAL HIGH (ref 0.8–1.2)
Prothrombin Time: 21.2 seconds — ABNORMAL HIGH (ref 11.4–15.2)

## 2019-05-13 LAB — POC OCCULT BLOOD, ED: Fecal Occult Bld: POSITIVE — AB

## 2019-05-13 MED ORDER — EMPTY CONTAINERS FLEXIBLE MISC
900.0000 mg | Freq: Once | Status: AC
  Administered 2019-05-13: 900 mg via INTRAVENOUS
  Filled 2019-05-13: qty 90

## 2019-05-13 MED ORDER — PANTOPRAZOLE SODIUM 40 MG IV SOLR
40.0000 mg | Freq: Once | INTRAVENOUS | Status: AC
  Administered 2019-05-13: 40 mg via INTRAVENOUS
  Filled 2019-05-13: qty 40

## 2019-05-13 MED ORDER — METOPROLOL TARTRATE 5 MG/5ML IV SOLN: 5.0000 mg | Freq: Once | INTRAVENOUS | Status: DC

## 2019-05-13 MED ORDER — SODIUM CHLORIDE 0.9 % IV BOLUS
1000.0000 mL | Freq: Once | INTRAVENOUS | Status: AC
  Administered 2019-05-13: 1000 mL via INTRAVENOUS

## 2019-05-13 MED ORDER — SODIUM CHLORIDE 0.9 % IV SOLN
10.0000 mL/h | Freq: Once | INTRAVENOUS | Status: AC
  Administered 2019-05-14: 10 mL/h via INTRAVENOUS

## 2019-05-13 MED ORDER — SODIUM CHLORIDE 0.9 % IV SOLN
1.0000 g | Freq: Once | INTRAVENOUS | Status: AC
  Administered 2019-05-13: 22:00:00 1 g via INTRAVENOUS
  Filled 2019-05-13: qty 10

## 2019-05-13 NOTE — ED Notes (Signed)
PT's daughter in lobby if information is needed. Stated patient has dementia.

## 2019-05-13 NOTE — ED Notes (Signed)
Pt removed US guided IV from L AC. Medications paused; IV team consulted. Pt agitated at present

## 2019-05-13 NOTE — ED Notes (Signed)
Spoke with EDP, EDP to talk to pt daughter about reversal medications.

## 2019-05-13 NOTE — ED Notes (Signed)
EMS also states home health aid reports patient has complained of generalized abd pain as well as increased agitation over the last few days. Pt has hx of dementia and is a poor historian. Pt lives by herself but has an aid that comes out daily.

## 2019-05-13 NOTE — ED Triage Notes (Signed)
Pt arrives via ems from home with uncontrolled afib and gi bleed. Per ems, pt with diarrhea that was dark red. Pt with hx of afib on eliquis. Hr 130-180 afib, 112/67, 100% RA.

## 2019-05-13 NOTE — ED Provider Notes (Signed)
[facility] EMERGENCY DEPARTMENT Provider Note   CSN: [id number] Arrival date & time: 05/31/2019  2122    History   Chief Complaint Chief Complaint  Patient presents with   Atrial Fibrillation   GI Bleeding    HPI Brittney Gutierrez is a 85 y.o. female.     The history is provided by the patient, the EMS personnel, a relative and medical records.  GI Problem  This is a new problem. The current episode started 2 days ago. The problem occurs constantly. The problem has been gradually worsening. Associated symptoms include abdominal pain. Pertinent negatives include no chest pain, no headaches and no shortness of breath. Nothing aggravates the symptoms. Nothing relieves the symptoms. She has tried water, food and rest for the symptoms. The treatment provided no relief.    Past Medical History:  Diagnosis Date   Abnormal weight loss 05/28/2004   Anxiety 01/17/2003   Back pain 08/19/2005   with radiculopathy   Bradycardia, drug induced    Diltiazem   Cerebral atherosclerosis 09/24/1999   Chest pain, atypical 04/2016   minor CAD at cath    CHF (congestive heart failure) ([place])    Chronic anticoagulation    Eliquis   Dementia ([place])    "don't know kind or stage" (05/02/2016)   Depression 09/06/2002   Diverticulitis 09/28/2007   External hemorrhoids 04/12/2009   Female climacteric state 03/04/2000   Formed visual hallucinations    [name] Syndrome?  Failed Keppra and Depakote   GERD (gastroesophageal reflux disease)    Hypertensive cardiovascular disease    moderate LVH on echo march 2017   Macular degeneration    Malaise and fatigue    Neurocysticercosis    Craniotomy at [facility] in early 2000s   Osteoarthrosis, localized    Osteoporosis 05/28/2004   Paranoia ([place])    Paroxysmal A-fib ([place])    TIA (transient ischemic attack) 2000s?   Urinary incontinence 08/18/2007   UTI (urinary tract infection)    Vaginitis, atrophic     Vertigo, peripheral 10/07/2000    Patient Active Problem List   Diagnosis Date Noted   Spinal stenosis of lumbar region with radiculopathy 11/30/2018   Fall 11/27/2018   Closed compression fracture of L2 lumbar vertebra, initial encounter ([place]) 11/27/2018   Paroxysmal atrial fibrillation ([place]) 11/20/2018   Major depressive disorder with single episode, in partial remission ([place]) 10/01/2018   Atherosclerosis of renal artery ([place]) 10/01/2018   Acute on chronic diastolic CHF (congestive heart failure) ([place]) 12/13/2017   External hemorrhoids 10/20/2017   Diverticulosis of large intestine without hemorrhage 10/20/2017   Iron deficiency anemia secondary to blood loss (chronic) 10/20/2017   Anxiety and depression 10/20/2017   Chronic diastolic heart failure ([place]) 07/29/2017   Expressive aphasia    Near syncope 07/09/2017   Hypertensive cardiovascular disease    Long term current use of anticoagulant    Polypharmacy 05/03/2017   DDD (degenerative disc disease), lumbar 03/24/2017   Left hip pain 03/24/2017   Current use of long term anticoagulation 06/16/2016   Chest pain 05/02/2016   GERD (gastroesophageal reflux disease) 04/18/2016   Depression 04/18/2016   SSS (sick sinus syndrome) ([place]) 04/11/2016   Chest pain, atypical 04/08/2016   Aortic insufficiency 01/14/2016   PAH (pulmonary artery hypertension) ([place]) 01/14/2016   Essential hypertension 01/14/2016   Persistent atrial fibrillation 12/27/2015   Dementia ([place]) 12/27/2015   Hypokalemia 12/27/2015   Demand ischemia [facility])     Past Surgical History:  Procedure Laterality Date  BRAIN SURGERY  2000   Neurocysticercosis ("parasite")   CARDIAC CATHETERIZATION N/A 05/03/2016   Procedure: Left Heart Cath and Coronary Angiography;  Surgeon: Sherren Mocha, MD;  Location: [place] CV LAB;  Service: Cardiovascular;  Laterality: N/A;   CARDIOVERSION N/A 07/14/2017   Procedure: CARDIOVERSION;  Surgeon:  Pixie Casino, MD;  Location: [facility] ENDOSCOPY;  Service: Cardiovascular;  Laterality: N/A;   CATARACT EXTRACTION  2013   CATARACT EXTRACTION, BILATERAL     "not sure if both eyes; feel like it probably was"   JOINT REPLACEMENT     PERIPHERAL VASCULAR CATHETERIZATION N/A 05/03/2016   Procedure: Abdominal Aortogram;  Surgeon: Sherren Mocha, MD;  Location: [place] [facility] LAB;  Service: Cardiovascular;  Laterality: N/A;   SHOULDER ARTHROSCOPY W/ ROTATOR CUFF REPAIR Right X 3   TEE WITHOUT CARDIOVERSION N/A 07/14/2017   Procedure: TRANSESOPHAGEAL ECHOCARDIOGRAM (TEE);  Surgeon: Pixie Casino, MD;  Location: [facility] ENDOSCOPY;  Service: Cardiovascular;  Laterality: N/A;   TOTAL KNEE ARTHROPLASTY Left [place]     OB History   No obstetric history on file.      Home Medications    Prior to Admission medications   Medication Sig Start Date End Date Taking? Authorizing Provider  acetaminophen (TYLENOL) 325 MG tablet Take 650 mg by mouth every 6 (six) hours as needed for mild pain.    [provider]  amLODipine (NORVASC) 10 MG tablet TAKE 1 TABLET (10 MG TOTAL) BY MOUTH DAILY. 03/03/19   Reed, Tiffany L, DO  Cholecalciferol (VITAMIN D3) 2000 units TABS Take 2,000 Units by mouth daily.     [provider]  docusate sodium (COLACE) 100 MG capsule Take 100 mg by mouth every other day.     [provider]  donepezil (ARICEPT) 10 MG tablet Take 1 tablet (10 mg total) by mouth daily. 10/01/18   Reed, Tiffany L, DO  ELIQUIS 2.5 MG TABS tablet TAKE 1 TABLET TWICE DAILY 12/10/18   Croitoru, Mihai, MD  furosemide (LASIX) 40 MG tablet Take 40 mg by mouth daily.    [provider]  losartan (COZAAR) 25 MG tablet TAKE 1 TABLET EVERY DAY 06/04/18   Eileen Stanford, PA-C  metoprolol tartrate (LOPRESSOR) 25 MG tablet Take 1 tablet (25 mg total) by mouth 2 (two) times daily. 11/30/18   Reed, Tiffany L, DO  venlafaxine (EFFEXOR) 75 MG tablet TAKE  1 TABLET TWICE DAILY 12/10/18   Gayland Curry, DO    Family History Family History  Problem Relation Age of Onset   Polycystic kidney disease Mother    Heart attack Father    Hypertension Sister    Dementia Neg Hx     Social History Social History   Tobacco Use   Smoking status: Never Smoker   Smokeless tobacco: Never Used  Substance Use Topics   Alcohol use: No    Alcohol/week: 0.0 standard drinks   Drug use: No     Allergies   Tape; Bactrim [sulfamethoxazole-trimethoprim]; Amiodarone; Amoxicillin; Demerol [meperidine]; Hydralazine; Lisinopril; Mirtazapine; and Zoloft [sertraline hcl]   Review of Systems Review of Systems  Respiratory: Negative for shortness of breath.   Cardiovascular: Negative for chest pain.  Gastrointestinal: Positive for abdominal pain.  Neurological: Negative for headaches.  All other systems reviewed and are negative.    Physical Exam Updated Vital Signs BP 105/67    Pulse (!) 112    Resp 17    SpO2 94%   Physical Exam  Vitals signs and nursing note reviewed.  Constitutional:      General: She is in acute distress.     Appearance: She is well-developed.  HENT:     Head: Normocephalic and atraumatic.     Mouth/Throat:     Mouth: Mucous membranes are dry.  Eyes:     Conjunctiva/sclera: Conjunctivae normal.  Neck:     Musculoskeletal: Neck supple.  Cardiovascular:     Rate and Rhythm: Tachycardia present. Rhythm irregular.  Pulmonary:     Effort: Pulmonary effort is normal. No respiratory distress.     Breath sounds: Normal breath sounds.  Abdominal:     Palpations: Abdomen is soft.     Tenderness: There is abdominal tenderness. There is no right CVA tenderness, left CVA tenderness, guarding or rebound.  Genitourinary:    Rectum: Guaiac result positive.     Comments: Melanotic stool present Musculoskeletal:     Right lower leg: No edema.     Left lower leg: No edema.  Skin:    General: Skin is warm and dry.      Capillary Refill: Capillary refill takes more than 3 seconds.     Coloration: Skin is pale.  Neurological:     Mental Status: She is alert.      ED Treatments / Results  Labs (all labs ordered are listed, but only abnormal results are displayed) Labs Reviewed  CBC WITH DIFFERENTIAL/PLATELET - Abnormal; Notable for the following components:      Result Value   Hemoglobin 11.8 (*)    All other components within normal limits  COMPREHENSIVE METABOLIC PANEL - Abnormal; Notable for the following components:   CO2 18 (*)    Glucose, Bld 134 (*)    BUN 81 (*)    Creatinine, Ser 3.41 (*)    ALT 129 (*)    GFR calc non Af Amer 11 (*)    GFR calc Af Amer 13 (*)    Anion gap 18 (*)    All other components within normal limits  LACTIC ACID, PLASMA - Abnormal; Notable for the following components:   Lactic Acid, Venous 2.5 (*)    All other components within normal limits  PROTIME-INR - Abnormal; Notable for the following components:   Prothrombin Time 21.2 (*)    INR 1.9 (*)    All other components within normal limits  HEPARIN LEVEL (UNFRACTIONATED) - Abnormal; Notable for the following components:   Heparin Unfractionated >2.20 (*)    All other components within normal limits  I-STAT CHEM 8, ED - Abnormal; Notable for the following components:   BUN 77 (*)    Creatinine, Ser 3.50 (*)    Glucose, Bld 131 (*)    Calcium, Ion 1.05 (*)    TCO2 20 (*)    All other components within normal limits  POC OCCULT BLOOD, ED - Abnormal; Notable for the following components:   Fecal Occult Bld POSITIVE (*)    All other components within normal limits  SARS CORONAVIRUS 2 (HOSPITAL ORDER, [place] LAB)  LIPASE, BLOOD  APTT  LACTIC ACID, PLASMA  I-STAT TROPONIN, ED  TYPE AND SCREEN  PREPARE RBC (CROSSMATCH)  ABO/RH    EKG None  Radiology Dg Chest Port 1 View  Result Date: 05/21/2019 CLINICAL DATA:  Shortness of breath EXAM: PORTABLE CHEST 1 VIEW COMPARISON:   12/13/2017 FINDINGS: Evaluation is limited by patient positioning. The cardiac silhouette is enlarged. There is a left basilar airspace opacity. No pneumothorax.  An old displaced right clavicle fracture is noted. There are advanced degenerative changes of the right glenohumeral joint. A background of emphysematous changes is suspected. IMPRESSION: 1. Cardiomegaly. 2. Persistent but improved opacity at the left lung base favored to represent chronic atelectasis or scarring. There may be a small underlying pleural effusion. Electronically Signed   By: Constance Holster M.D.   On: 06/03/2019 22:32    Procedures Procedures (including critical care time)  Medications Ordered in ED Medications  0.9 %  sodium chloride infusion (has no administration in time range)  sodium chloride 0.9 % bolus 1,000 mL (1,000 mLs Intravenous New Bag/Given 05/12/2019 2218)  pantoprazole (PROTONIX) injection 40 mg (40 mg Intravenous Given 05/18/2019 2218)  cefTRIAXone (ROCEPHIN) 1 g in sodium chloride 0.9 % 100 mL IVPB (0 g Intravenous Stopped 05/11/2019 2259)  coag fact Xa recombinant (ANDEXXA) low dose infusion 900 mg (900 mg Intravenous New Bag/Given 05/22/2019 2303)     Initial Impression / Assessment and Plan / ED Course  I have reviewed the triage vital signs and the nursing notes.  Pertinent labs & imaging results that were available during my care of the patient were reviewed by me and considered in my medical decision making (see chart for details).         Medical Decision Making:  Shonique Pelphrey is a 84 y.o. female who presented to the ED today with tachycardia, melanotic stool.   Past medical history significant for fibrillation on chronic anticoagulation with Eliquis, hypertension, dementia, previous TIA, coronary artery disease diastolic dysfunction CHF, hypertension, degenerative disc disease and diverticulosis with positive arthritis  Reviewed and confirmed nursing documentation for past medical history,  family history, social history. On my initial exam, the pt was in acute distress, tachycardic to 150s, regular, blood pressure 100/60, grossly demented, follows commands, no increased work of breathing or respiratory distress, afebrile, melanotic stool on exam Patient on Eliquis for atrial fibrillation, worsening melanotic diarrhea over the last few days, took medication this a.m., discussed with pharmacy, will reverse, type and screen ordered, blood ordered, fluid bolus given, IV PPI, antibiotics administered. Upper GI bleed versus lower GI bleed seen possible, potentially upper given dark in color of stool. All radiology and laboratory studies reviewed independently and with my attending physician, agree with reading provided by radiologist unless otherwise noted. Hemoglobin 11.8, prior from 11/27/2018 9.8.  CMP shows significant AKI, creatinine 3.4, BUN 81.  Lactic acid 2.85.  INR 1.9. Upon reassessing patient, patient was calm, resting comfortably, consulted hospitalist for admission. Based on the above findings, I believe patient requires admission. Patient admitted.  Patient will go to stepdown unit for further evaluation and care. The above care was discussed with and agreed upon by my attending physician. Emergency Department Medication Summary:  Medications  0.9 %  sodium chloride infusion (has no administration in time range)  sodium chloride 0.9 % bolus 1,000 mL (1,000 mLs Intravenous New Bag/Given 06/06/2019 2218)  pantoprazole (PROTONIX) injection 40 mg (40 mg Intravenous Given 05/12/2019 2218)  cefTRIAXone (ROCEPHIN) 1 g in sodium chloride 0.9 % 100 mL IVPB (0 g Intravenous Stopped 06/03/2019 2259)  coag fact Xa recombinant (ANDEXXA) low dose infusion 900 mg (900 mg Intravenous New Bag/Given 05/28/2019 2303)        Lonzo Candy, MD 05/22/2019 2333    Drenda Freeze, MD 05/20/19 [phone]

## 2019-05-14 ENCOUNTER — Inpatient Hospital Stay (HOSPITAL_COMMUNITY): Payer: Medicare Other

## 2019-05-14 ENCOUNTER — Encounter (HOSPITAL_COMMUNITY): Payer: Self-pay | Admitting: Cardiology

## 2019-05-14 DIAGNOSIS — F0150 Vascular dementia without behavioral disturbance: Secondary | ICD-10-CM

## 2019-05-14 DIAGNOSIS — Z452 Encounter for adjustment and management of vascular access device: Secondary | ICD-10-CM

## 2019-05-14 DIAGNOSIS — K922 Gastrointestinal hemorrhage, unspecified: Secondary | ICD-10-CM

## 2019-05-14 LAB — CBC
HCT: 32.6 % — ABNORMAL LOW (ref 36.0–46.0)
Hemoglobin: 10.4 g/dL — ABNORMAL LOW (ref 12.0–15.0)
MCH: 29.9 pg (ref 26.0–34.0)
MCHC: 31.9 g/dL (ref 30.0–36.0)
MCV: 93.7 fL (ref 80.0–100.0)
Platelets: 182 10*3/uL (ref 150–400)
RBC: 3.48 MIL/uL — ABNORMAL LOW (ref 3.87–5.11)
RDW: 15.4 % (ref 11.5–15.5)
WBC: 7.1 10*3/uL (ref 4.0–10.5)
nRBC: 0.3 % — ABNORMAL HIGH (ref 0.0–0.2)

## 2019-05-14 LAB — BASIC METABOLIC PANEL
Anion gap: 14 (ref 5–15)
BUN: 75 mg/dL — ABNORMAL HIGH (ref 8–23)
CO2: 19 mmol/L — ABNORMAL LOW (ref 22–32)
Calcium: 8.6 mg/dL — ABNORMAL LOW (ref 8.9–10.3)
Chloride: 105 mmol/L (ref 98–111)
Creatinine, Ser: 3.27 mg/dL — ABNORMAL HIGH (ref 0.44–1.00)
GFR calc Af Amer: 14 mL/min — ABNORMAL LOW (ref >60)
GFR calc non Af Amer: 12 mL/min — ABNORMAL LOW (ref >60)
Glucose, Bld: 82 mg/dL (ref 70–99)
Potassium: 3.3 mmol/L — ABNORMAL LOW (ref 3.5–5.1)
Sodium: 138 mmol/L (ref 135–145)

## 2019-05-14 LAB — GLUCOSE, CAPILLARY
Glucose-Capillary: 96 mg/dL (ref 70–99)
Glucose-Capillary: 114 mg/dL — ABNORMAL HIGH (ref 70–99)
Glucose-Capillary: 97 mg/dL (ref 70–99)
Glucose-Capillary: 121 mg/dL — ABNORMAL HIGH (ref 70–99)

## 2019-05-14 MED ORDER — SODIUM CHLORIDE 0.9 % IV SOLN
8.0000 mg/h | INTRAVENOUS | Status: DC
  Administered 2019-05-14 – 2019-05-15 (×5): 8 mg/h via INTRAVENOUS
  Filled 2019-05-14 (×7): qty 80

## 2019-05-14 MED ORDER — WHITE PETROLATUM EX OINT
TOPICAL_OINTMENT | CUTANEOUS | Status: AC
  Administered 2019-05-14: 06:00:00
  Filled 2019-05-14: qty 28.35

## 2019-05-14 MED ORDER — ACETAMINOPHEN 325 MG PO TABS: 650.0000 mg | ORAL_TABLET | Freq: Four times a day (QID) | ORAL | Status: DC | PRN

## 2019-05-14 MED ORDER — PANTOPRAZOLE SODIUM 40 MG IV SOLR: 40.0000 mg | Freq: Two times a day (BID) | INTRAVENOUS | Status: DC

## 2019-05-14 MED ORDER — SODIUM CHLORIDE 0.9% FLUSH
10.0000 mL | Freq: Two times a day (BID) | INTRAVENOUS | Status: DC
  Administered 2019-05-15 (×2): 10 mL

## 2019-05-14 MED ORDER — SODIUM CHLORIDE 0.9% FLUSH: 10.0000 mL | INTRAVENOUS | Status: DC | PRN

## 2019-05-14 MED ORDER — DOCUSATE SODIUM 100 MG PO CAPS
100.0000 mg | ORAL_CAPSULE | Freq: Two times a day (BID) | ORAL | Status: DC
  Administered 2019-05-14 – 2019-05-15 (×2): 100 mg via ORAL
  Filled 2019-05-14 (×3): qty 1

## 2019-05-14 MED ORDER — SODIUM CHLORIDE 0.9 % IV SOLN: 80.0000 mg | Freq: Once | INTRAVENOUS | Status: DC

## 2019-05-14 MED ORDER — AMIODARONE HCL IN DEXTROSE 360-4.14 MG/200ML-% IV SOLN
60.0000 mg/h | INTRAVENOUS | Status: AC
  Administered 2019-05-14: 07:00:00 60 mg/h via INTRAVENOUS
  Filled 2019-05-14 (×2): qty 200

## 2019-05-14 MED ORDER — PIPERACILLIN-TAZOBACTAM IN DEX 2-0.25 GM/50ML IV SOLN
2.2500 g | Freq: Three times a day (TID) | INTRAVENOUS | Status: DC
  Administered 2019-05-14 – 2019-05-16 (×7): 2.25 g via INTRAVENOUS
  Filled 2019-05-14 (×10): qty 50

## 2019-05-14 MED ORDER — AMIODARONE LOAD VIA INFUSION
150.0000 mg | Freq: Once | INTRAVENOUS | Status: AC
  Administered 2019-05-14: 150 mg via INTRAVENOUS
  Filled 2019-05-14: qty 83.34

## 2019-05-14 MED ORDER — AMIODARONE HCL IN DEXTROSE 360-4.14 MG/200ML-% IV SOLN
30.0000 mg/h | INTRAVENOUS | Status: DC
  Administered 2019-05-14 – 2019-05-16 (×4): 30 mg/h via INTRAVENOUS
  Filled 2019-05-14 (×7): qty 200

## 2019-05-14 MED ORDER — DIPHENHYDRAMINE HCL 50 MG/ML IJ SOLN: 12.5000 mg | Freq: Four times a day (QID) | INTRAMUSCULAR | Status: DC | PRN

## 2019-05-14 MED ORDER — ONDANSETRON HCL 4 MG/2ML IJ SOLN: 4.0000 mg | Freq: Four times a day (QID) | INTRAMUSCULAR | Status: DC | PRN

## 2019-05-14 MED ORDER — TRAMADOL HCL 50 MG PO TABS: 50.0000 mg | ORAL_TABLET | Freq: Two times a day (BID) | ORAL | Status: DC | PRN

## 2019-05-14 MED ORDER — PANTOPRAZOLE SODIUM 40 MG IV SOLR
40.0000 mg | Freq: Once | INTRAVENOUS | Status: DC
  Filled 2019-05-14: qty 40

## 2019-05-14 MED ORDER — AMIODARONE IV BOLUS ONLY 150 MG/100ML
150.0000 mg | Freq: Once | INTRAVENOUS | Status: AC
  Administered 2019-05-14: 150 mg via INTRAVENOUS
  Filled 2019-05-14: qty 100

## 2019-05-14 MED ORDER — SODIUM CHLORIDE 0.9 % IV SOLN
INTRAVENOUS | Status: AC
  Administered 2019-05-14 (×2): via INTRAVENOUS

## 2019-05-14 MED ORDER — ONDANSETRON HCL 4 MG PO TABS: 4.0000 mg | ORAL_TABLET | Freq: Four times a day (QID) | ORAL | Status: DC | PRN

## 2019-05-14 MED ORDER — ACETAMINOPHEN 650 MG RE SUPP: 650.0000 mg | Freq: Four times a day (QID) | RECTAL | Status: DC | PRN

## 2019-05-14 NOTE — Consult Note (Signed)
Referring Provider:  [place] Primary Care Physician:  Gayland Curry, DO Primary Gastroenterologist: Althia Forts  Reason for Consultation: GI bleed  HPI: Brittney Gutierrez is a 85 y.o. female with past medical history of paroxysmal atrial fibrillation currently on Eliquis, history of CHF, hypertension and dementia brought into the hospital for GI bleed/melena from skilled nursing facility.  Hemoglobin 11.8 on admission.  Mild elevated ALT at 129.  Normal platelet counts.  Normal lipase.  Elevated INR at 1.9.  Blood positive.  GI is consulted for further evaluation.  Patient seen and examined at bedside.  Patient's daughter at bedside.  Patient has dementia and was able to give only limited history.  Daughter denies any history of GI bleed in the past.  According to daughter, staff at skilled nursing facility noted diarrhea with dark stools.  Patient denies any abdominal pain.  Denies nausea vomiting.  Past Medical History:  Diagnosis Date  . Abnormal weight loss 05/28/2004  . Anxiety 01/17/2003  . Back pain 08/19/2005   with radiculopathy  . Bradycardia, drug induced    Diltiazem  . Cerebral atherosclerosis 09/24/1999  . Chest pain, atypical 04/2016   minor CAD at cath   . CHF (congestive heart failure) ([place])   . Chronic anticoagulation    Eliquis  . Dementia ([place])    "don't know kind or stage" (05/02/2016)  . Depression 09/06/2002  . Diverticulitis 09/28/2007  . External hemorrhoids 04/12/2009  . Female climacteric state 03/04/2000  . 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 GERD (gastroesophageal reflux disease)   . Hypertensive cardiovascular disease    moderate LVH on echo march 2017  . Macular degeneration   . Malaise and fatigue   . 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 Osteoarthrosis, localized   . Osteoporosis 05/28/2004  . Paranoia ([place])   . Paroxysmal A-fib ([place])   . TIA (transient ischemic attack) 2000s?  [name]  Urinary incontinence 08/18/2007  . UTI (urinary tract infection)   . Vaginitis, atrophic   . Vertigo, peripheral 10/07/2000    Past Surgical History:  Procedure Laterality Date  . BRAIN SURGERY  2000   Neurocysticercosis ("parasite")  . CARDIAC CATHETERIZATION N/A 05/03/2016   Procedure: Left Heart Cath and Coronary Angiography;  Surgeon: Sherren Mocha, MD;  Location: [facility];  Service: Cardiovascular;  Laterality: N/A;  . CARDIOVERSION N/A 07/14/2017   Procedure: CARDIOVERSION;  Surgeon: Pixie Casino, MD;  Location: [facility] ENDOSCOPY;  Service: Cardiovascular;  Laterality: N/A;  . CATARACT EXTRACTION  2013  . CATARACT EXTRACTION, BILATERAL     "not sure if both eyes; feel like it probably was"  . JOINT REPLACEMENT    . PERIPHERAL VASCULAR CATHETERIZATION N/A 05/03/2016   Procedure: Abdominal Aortogram;  Surgeon: Sherren Mocha, MD;  Location: [facility];  Service: Cardiovascular;  Laterality: N/A;  . SHOULDER ARTHROSCOPY W/ ROTATOR CUFF REPAIR Right X 3  . TEE WITHOUT CARDIOVERSION N/A 07/14/2017   Procedure: TRANSESOPHAGEAL ECHOCARDIOGRAM (TEE);  Surgeon: Pixie Casino, MD;  Location: [facility];  Service: Cardiovascular;  Laterality: N/A;  . TOTAL KNEE ARTHROPLASTY Left 1990  . VAGINAL HYSTERECTOMY  1960    Prior to Admission medications   Medication Sig Start Date End Date Taking? Authorizing Provider  acetaminophen (TYLENOL) 325 MG tablet Take 650 mg by mouth every 6 (six) hours as needed for mild pain.   Yes [provider]  amLODipine (NORVASC) 10 MG  tablet TAKE 1 TABLET (10 MG TOTAL) BY MOUTH DAILY. 03/03/19  Yes Reed, Tiffany L, DO  Cholecalciferol (VITAMIN D3) 2000 units TABS Take 2,000 Units by mouth daily.    Yes [provider]  docusate sodium (COLACE) 100 MG capsule Take 100 mg by mouth every other day.    Yes [provider]  donepezil (ARICEPT) 10 MG tablet Take 1 tablet (10 mg total) by mouth daily. Patient taking  differently: Take 10 mg by mouth at bedtime.  10/01/18  Yes Reed, Tiffany L, DO  ELIQUIS 2.5 MG TABS tablet TAKE 1 TABLET TWICE DAILY Patient taking differently: Take 2.5 mg by mouth 2 (two) times daily.  12/10/18  Yes Croitoru, Mihai, MD  furosemide (LASIX) 40 MG tablet Take 40 mg by mouth daily.   Yes [provider]  losartan (COZAAR) 25 MG tablet TAKE 1 TABLET EVERY DAY Patient taking differently: Take 25 mg by mouth daily.  06/04/18  Yes Eileen Stanford, PA-C  metoprolol tartrate (LOPRESSOR) 25 MG tablet Take 1 tablet (25 mg total) by mouth 2 (two) times daily. 11/30/18  Yes Reed, Tiffany L, DO  venlafaxine (EFFEXOR) 75 MG tablet TAKE 1 TABLET TWICE DAILY Patient taking differently: Take 75 mg by mouth 2 (two) times daily with a meal.  12/10/18  Yes Reed, Tiffany L, DO    Scheduled Meds: . [START ON 05/17/2019] pantoprazole  40 mg Intravenous Q12H  . pantoprazole (PROTONIX) IV  40 mg Intravenous Once   Continuous Infusions: . sodium chloride 100 mL/hr at 05/14/19 0238  . amiodarone 60 mg/hr (05/14/19 0706)   Followed by  . amiodarone    . pantoprozole (PROTONIX) infusion 8 mg/hr (05/14/19 0634)  . piperacillin-tazobactam (ZOSYN)  IV 2.25 g (05/14/19 0248)   PRN Meds:.acetaminophen **OR** acetaminophen, diphenhydrAMINE, ondansetron **OR** ondansetron (ZOFRAN) IV  Allergies as of 05/15/2019 - Review Complete 05/15/2019  Allergen Reaction Noted  . Tape Rash and Other (See Comments) 04/24/2016  . Bactrim [sulfamethoxazole-trimethoprim] Nausea Only 05/09/2017  . Amiodarone Itching 03/15/2016  . Amoxicillin Other (See Comments) 12/25/2015  . Demerol [meperidine] Other (See Comments) 12/25/2015  . Hydralazine Other (See Comments) 06/14/2016  . Lisinopril Cough 06/14/2016  . Mirtazapine Other (See Comments) 05/05/2019  . Zoloft [sertraline hcl] Other (See Comments) 05/01/2017    Family History  Problem Relation Age of Onset  . Polycystic kidney disease Mother   . Heart  attack Father   . Hypertension Sister   . Dementia Neg Hx     Social History   Socioeconomic History  . Marital status: Widowed    Spouse name: Not on file  . Number of children: Not on file  . Years of education: Not on file  . Highest education level: Not on file  Occupational History  . Not on file  Social Needs  . Financial resource strain: Not hard at all  . Food insecurity:    Worry: Never true    Inability: Never true  . Transportation needs:    Medical: No    Non-medical: No  Tobacco Use  . Smoking status: Never Smoker  . Smokeless tobacco: Never Used  Substance and Sexual Activity  . Alcohol use: No    Alcohol/week: 0.0 standard drinks  . Drug use: No  . Sexual activity: Never  Lifestyle  . Physical activity:    Days per week: 7 days    Minutes per session: 20 min  . Stress: To some extent  Relationships  . Social connections:  Talks on phone: More than three times a week    Gets together: More than three times a week    Attends religious service: Never    Active member of club or organization: No    Attends meetings of clubs or organizations: Never    Relationship status: Widowed  . Intimate partner violence:    Fear of current or ex partner: No    Emotionally abused: No    Physically abused: No    Forced sexual activity: No  Other Topics Concern  . Not on file  Social History Narrative   DIET: None      DO YOU DRINK/EAT THINGS WITH CAFFEINE: yes      MARITAL STATUS: widow      WHAT YEAR WERE YOU MARRIED: 1948      DO YOU LIVE IN A HOUSE, APARTMENT, ASSISTED LIVING, CONDO TRAILER ETC.: Independent Living      IS IT ONE OR MORE STORIES: 1 story      HOW MANY PERSONS LIVE IN YOUR HOME: 1      DO YOU HAVE PETS IN YOUR HOME: no      CURRENT OR PAST PROFESSION: Book Keeper      DO YOU EXERCISE: no      WHAT TYPE AND HOW OFTEN:    Review of Systems: All negative except as stated above in HPI.  Limited review of system because of  dementia.  Physical Exam: Vital signs: Vitals:   05/14/19 0519 05/14/19 0753  BP: 104/67 106/68  Pulse: (!) 144 60  Resp: (!) 21 (!) 23  Temp: 97.6 F (36.4 C)   SpO2: 98% 98%     Physical Exam  Constitutional:  Elderly patient not in acute distress.  HENT:  Head: Normocephalic and atraumatic.  Mouth/Throat: Oropharynx is clear and moist. No oropharyngeal exudate.  Eyes: EOM are normal. No scleral icterus.  Neck: Normal range of motion. Neck supple.  Cardiovascular:  Irregular rate and rhythm, tachycardia  Pulmonary/Chest: Effort normal. No respiratory distress.  Abdominal: Soft. Bowel sounds are normal. She exhibits no distension. There is no abdominal tenderness. There is no rebound and no guarding.  Musculoskeletal: Normal range of motion.        General: No edema.  Neurological:  Dementia otherwise alert and able to do limited history.  Skin: Skin is warm. No erythema.  Psychiatric: She has a normal mood and affect. Thought content normal.  Vitals reviewed.   GI:  Lab Results: Recent Labs    06/06/2019 2202 05/18/2019 2216 05/14/19 0617  WBC 9.2  --  7.1  HGB 11.8* 12.6 10.4*  HCT 37.3 37.0 32.6*  PLT 219  --  182   BMET Recent Labs    06/07/2019 2202 05/18/2019 2216 05/14/19 0617  NA 137 135 138  K 3.7 3.6 3.3*  CL 101 103 105  CO2 18*  --  19*  GLUCOSE 134* 131* 82  BUN 81* 77* 75*  CREATININE 3.41* 3.50* 3.27*  CALCIUM 9.4  --  8.6*   LFT Recent Labs    05/21/2019 2202  PROT 7.0  ALBUMIN 3.5  AST 33  ALT 129*  ALKPHOS 93  BILITOT 1.1   PT/INR Recent Labs    06/01/2019 2209  LABPROT 21.2*  INR 1.9*     Studies/Results: Dg Chest Port 1 View  Result Date: 05/14/2019 CLINICAL DATA:  Central line placement EXAM: PORTABLE CHEST 1 VIEW COMPARISON:  Yesterday FINDINGS: New right IJ line with tip at the  SVC.  No pneumothorax. Cardiomegaly distorted by rightward rotation. There is a new infiltrate at the left base. Remote nonunited right mid  clavicle fracture. IMPRESSION: 1. New central line without complicating feature. 2. Probable left lower lobe pneumonia or aspiration. Electronically Signed   By: Monte Fantasia M.D.   On: 05/14/2019 06:45   Dg Chest Port 1 View  Result Date: 06/01/2019 CLINICAL DATA:  Shortness of breath EXAM: PORTABLE CHEST 1 VIEW COMPARISON:  12/13/2017 FINDINGS: Evaluation is limited by patient positioning. The cardiac silhouette is enlarged. There is a left basilar airspace opacity. No pneumothorax. An old displaced right clavicle fracture is noted. There are advanced degenerative changes of the right glenohumeral joint. A background of emphysematous changes is suspected. IMPRESSION: 1. Cardiomegaly. 2. Persistent but improved opacity at the left lung base favored to represent chronic atelectasis or scarring. There may be a small underlying pleural effusion. Electronically Signed   By: Constance Holster M.D.   On: 06/07/2019 22:32    Impression/Plan: -GI bleed with dark stools.  Most likely upper GI bleed.  Unable to get appropriate history from patient or from family members. -occult Blood positive stool -Chronic anemia.  Baseline hemoglobin between 10 and 11.  Hemoglobin today is 10.4. -Atrial fibrillation.  Last dose of Eliquis yesterday evening per daughter. -Dementia -Elevated ALT.  129.  Other LFTs normal.  CT abdomen pelvis with IV contrast in December 2019 showed normal-appearing liver. -Intermittent  diarrhea.  She is status post cholecystectomy.  Could be bile salt diarrhea.  Recommendations ------------------------- -Supportive care for now. -Continue to hold anticoagulation. - Continue  PPI. -Okay to start full liquid diet. -Check CBC , CMP and INR in the morning.  Check hepatitis panel. -GI will follow.  Management plan discussed with the daughter.  She prefers conservative management for now.    LOS: 1 day   Otis Brace  MD, FACP 05/14/2019, 10:07 AM  Contact #  [phone]

## 2019-05-14 NOTE — Progress Notes (Signed)
PROGRESS NOTE    Brittney Gutierrez  FBP:[id number] DOB: 1927/06/11 DOA: 05/14/2019 PCP: Brittney Curry, DO    Brief Narrative:  85 y.o. female with history of paroxysmal atrial fibrillation, diastolic CHF, hypertension, dementia was found to have had multiple episodes of loose bowel stools melanotic in appearance yesterday at her living facility.  Patient also was complaining of lower abdominal discomfort.  Patient's heart rate was found to be elevated.  Per daughter patient has not been doing well last 2 weeks with poor appetite and had to have some person sit with her to eat.  Despite which patient still was not eating well.  Patient had not complained of any chest pain or shortness of breath.  Patient is on Eliquis for A. fib.  ED Course: In the ER patient is found to be in A. fib with RVR blood pressure with pain to low normal.  Labs revealed creatinine of 3.4 which increased from 1.46 months ago.  Hemoglobin was 11.8 and patient does have chronic anemia hemoglobin stands around 10.  Stool for occult blood was positive.  ER physician discussed with on-call gastroenterologist who will be seeing patient in consult.  Patient started on Protonix.  IV fluid bolus for acute renal failure with low normal blood pressure and also received patient heart rate improved with it.  Assessment & Plan:   Principal Problem:   Acute GI bleeding Active Problems:   Dementia (HCC)   SSS (sick sinus syndrome) (HCC)   Atrial fibrillation with RVR (HCC)   ARF (acute renal failure) ([place])  1. Acute GI bleeding with acute blood loss anemia 1. Report of dark colored stools noted 2. Hemodynamically stable at this time 3. Eliquis currently on hold, see below 4. Per GI, cont with supportive care for now  2. A. fib with RVR 1. Continued on amiodarone gtt 2. Eliquis on hold given concerns of acute blood loss anemia 3. Acute renal failure likely from poor oral intake and GI bleed.   1. Continue fluids as tolerated  2. Repeat bmet in AM 4. Abdominal pain 1. Per daughter patient was complaining of abdominal pain day prior to admit 2. On further questioning, pain seems to be more lower quadrant. Pt has hx of both diarrhea as well as constipation, also noted to strain to move bowels in past 3. Will start patient on scheduled colace. 5. History of CHF with preserved EF 1. Clinically euvolemic at present 2. Tolerating IVF 6. Dementia. 1. Seems stable at present 2. Family is at bedside 7. Hypertension holding antihypertensives. 1. BP stable at present 8. Acute blood loss anemia 1. Repeat CBC in AM  DVT prophylaxis: SCD's Code Status: DNR Family Communication: Pt in room, family at bedside Disposition Plan: Uncertain at this time  Consultants:   GI  Cardiology  Procedures:     Antimicrobials: Anti-infectives (From admission, onward)   Start     Dose/Rate Route Frequency Ordered Stop   05/14/19 0130  piperacillin-tazobactam (ZOSYN) IVPB 2.25 g     2.25 g 100 mL/hr over 30 Minutes Intravenous Every 8 hours 05/14/19 0124     05/22/2019 2215  cefTRIAXone (ROCEPHIN) 1 g in sodium chloride 0.9 % 100 mL IVPB     1 g 200 mL/hr over 30 Minutes Intravenous  Once 05/19/2019 2202 05/19/2019 2259       Subjective: Without complaints this AM  Objective: Vitals:   05/14/19 1031 05/14/19 1100 05/14/19 1200 05/14/19 1220  BP: 101/77 107/75 95/69 111/74  Pulse: 80 (!)  102 (!) 53 (!) 104  Resp: (!) 23 20 19  (!) 25  Temp:      TempSrc:      SpO2: 99% 100% 98% 98%  Weight:      Height:        Intake/Output Summary (Last 24 hours) at 05/14/2019 1243 Last data filed at 05/14/2019 0538 Gross per 24 hour  Intake -  Output 1 ml  Net -1 ml   Filed Weights   05/14/19 0030 05/14/19 0519  Weight: 57.4 kg 57.4 kg    Examination:  General exam: Appears calm and comfortable  Respiratory system: Clear to auscultation. Respiratory effort normal. Cardiovascular system: S1 & S2 heard, tachycardic  Gastrointestinal system: Abdomen is nondistended, soft and nontender. No organomegaly or masses felt. Normal bowel sounds heard. Central nervous system: Alert and oriented. No focal neurological deficits. Extremities: Symmetric 5 x 5 power. Skin: No rashes, lesions Psychiatry: Judgement and insight appear normal. Mood & affect appropriate.   Data Reviewed: I have personally reviewed following labs and imaging studies  CBC: Recent Labs  Lab 06/05/2019 2202 05/18/2019 2216 05/14/19 0617  WBC 9.2  --  7.1  NEUTROABS 7.7  --   --   HGB 11.8* 12.6 10.4*  HCT 37.3 37.0 32.6*  MCV 95.2  --  93.7  PLT 219  --  774   Basic Metabolic Panel: Recent Labs  Lab 06/02/2019 2202 05/14/2019 2216 05/14/19 0617  NA 137 135 138  K 3.7 3.6 3.3*  CL 101 103 105  CO2 18*  --  19*  GLUCOSE 134* 131* 82  BUN 81* 77* 75*  CREATININE 3.41* 3.50* 3.27*  CALCIUM 9.4  --  8.6*   GFR: Estimated Creatinine Clearance: 9.5 mL/min (A) (by C-G formula based on SCr of 3.27 mg/dL (H)). Liver Function Tests: Recent Labs  Lab 05/19/2019 2202  AST 33  ALT 129*  ALKPHOS 93  BILITOT 1.1  PROT 7.0  ALBUMIN 3.5   Recent Labs  Lab 05/17/2019 2202  LIPASE 43   No results for input(s): AMMONIA in the last 168 hours. Coagulation Profile: Recent Labs  Lab 05/29/2019 2209  INR 1.9*   Cardiac Enzymes: No results for input(s): CKTOTAL, CKMB, CKMBINDEX, TROPONINI in the last 168 hours. BNP (last 3 results) No results for input(s): PROBNP in the last 8760 hours. HbA1C: No results for input(s): HGBA1C in the last 72 hours. CBG: Recent Labs  Lab 05/14/19 0515 05/14/19 0836 05/14/19 1205  GLUCAP 96 114* 97   Lipid Profile: No results for input(s): CHOL, HDL, LDLCALC, TRIG, CHOLHDL, LDLDIRECT in the last 72 hours. Thyroid Function Tests: No results for input(s): TSH, T4TOTAL, FREET4, T3FREE, THYROIDAB in the last 72 hours. Anemia Panel: No results for input(s): VITAMINB12, FOLATE, FERRITIN, TIBC, IRON,  RETICCTPCT in the last 72 hours. Sepsis Labs: Recent Labs  Lab 06/08/2019 2202  LATICACIDVEN 2.5*    Recent Results (from the past 240 hour(s))  SARS Coronavirus 2 (CEPHEID - Performed in [facility] hospital lab), Hosp Order     Status: None   Collection Time: 05/15/2019 10:29 PM  Result Value Ref Range Status   SARS Coronavirus 2 NEGATIVE NEGATIVE Final    Comment: (NOTE) If result is NEGATIVE SARS-CoV-2 target nucleic acids are NOT DETECTED. The SARS-CoV-2 RNA is generally detectable in upper and lower  respiratory specimens during the acute phase of infection. The lowest  concentration of SARS-CoV-2 viral copies this assay can detect is 250  copies / mL. A negative result  does not preclude SARS-CoV-2 infection  and should not be used as the sole basis for treatment or other  patient management decisions.  A negative result may occur with  improper specimen collection / handling, submission of specimen other  than nasopharyngeal swab, presence of viral mutation(s) within the  areas targeted by this assay, and inadequate number of viral copies  (<250 copies / mL). A negative result must be combined with clinical  observations, patient history, and epidemiological information. If result is POSITIVE SARS-CoV-2 target nucleic acids are DETECTED. The SARS-CoV-2 RNA is generally detectable in upper and lower  respiratory specimens dur ing the acute phase of infection.  Positive  results are indicative of active infection with SARS-CoV-2.  Clinical  correlation with patient history and other diagnostic information is  necessary to determine patient infection status.  Positive results do  not rule out bacterial infection or co-infection with other viruses. If result is PRESUMPTIVE POSTIVE SARS-CoV-2 nucleic acids MAY BE PRESENT.   A presumptive positive result was obtained on the submitted specimen  and confirmed on repeat testing.  While 2019 novel coronavirus  (SARS-CoV-2) nucleic  acids may be present in the submitted sample  additional confirmatory testing may be necessary for epidemiological  and / or clinical management purposes  to differentiate between  SARS-CoV-2 and other Sarbecovirus currently known to infect humans.  If clinically indicated additional testing with an alternate test  methodology [phone]) is advised. The SARS-CoV-2 RNA is generally  detectable in upper and lower respiratory sp ecimens during the acute  phase of infection. The expected result is Negative. Fact Sheet for Patients:  StrictlyIdeas.no Fact Sheet for Healthcare Providers: BankingDealers.co.za This test is not yet approved or cleared by the [place] FDA and has been authorized for detection and/or diagnosis of SARS-CoV-2 by FDA under an Emergency Use Authorization (EUA).  This EUA will remain in effect (meaning this test can be used) for the duration of the COVID-19 declaration under Section 564(b)(1) of the Act, 21 U.S.C. section 360bbb-3(b)(1), unless the authorization is terminated or revoked sooner. Performed at [facility], [place] [address] [place],  [address]      Radiology Studies: Dg Chest Port 1 View  Result Date: 05/14/2019 CLINICAL DATA:  Central line placement EXAM: PORTABLE CHEST 1 VIEW COMPARISON:  Yesterday FINDINGS: New right IJ line with tip at the SVC.  No pneumothorax. Cardiomegaly distorted by rightward rotation. There is a new infiltrate at the left base. Remote nonunited right mid clavicle fracture. IMPRESSION: 1. New central line without complicating feature. 2. Probable left lower lobe pneumonia or aspiration. Electronically Signed   By: Monte Fantasia M.D.   On: 05/14/2019 06:45   Dg Chest Port 1 View  Result Date: 05/31/2019 CLINICAL DATA:  Shortness of breath EXAM: PORTABLE CHEST 1 VIEW COMPARISON:  12/13/2017 FINDINGS: Evaluation is limited by patient positioning. The cardiac silhouette  is enlarged. There is a left basilar airspace opacity. No pneumothorax. An old displaced right clavicle fracture is noted. There are advanced degenerative changes of the right glenohumeral joint. A background of emphysematous changes is suspected. IMPRESSION: 1. Cardiomegaly. 2. Persistent but improved opacity at the left lung base favored to represent chronic atelectasis or scarring. There may be a small underlying pleural effusion. Electronically Signed   By: Constance Holster M.D.   On: 05/24/2019 22:32    Scheduled Meds: . [START ON 05/17/2019] pantoprazole  40 mg Intravenous Q12H  . pantoprazole (PROTONIX) IV  40 mg Intravenous Once  Continuous Infusions: . sodium chloride 100 mL/hr at 05/14/19 0238  . amiodarone 30 mg/hr (05/14/19 1237)  . pantoprozole (PROTONIX) infusion 8 mg/hr (05/14/19 0634)  . piperacillin-tazobactam (ZOSYN)  IV 2.25 g (05/14/19 1030)     LOS: 1 day   Marylu Lund, MD [facility] Pager On Amion  If 7PM-7AM, please contact night-coverage 05/14/2019, 12:43 PM

## 2019-05-14 NOTE — Progress Notes (Signed)
Pharmacy Antibiotic Note  Brittney Gutierrez is a 85 y.o. female admitted on 05/12/2019 with GI bleeding.  Pharmacy has been consulted for Zosyn dosing for r/o intra-abdominal infection. WBC WNL. Noted renal dysfunction.   Plan: Zosyn 2.25g IV q8h Trend WBC, temp, renal function  F/U infectious work-up  No data recorded.  Recent Labs  Lab 05/29/2019 2202 06/02/2019 2216  WBC 9.2  --   CREATININE 3.41* 3.50*  LATICACIDVEN 2.5*  --     Estimated Creatinine Clearance: 8.2 mL/min (A) (by C-G formula based on SCr of 3.5 mg/dL (H)).    Allergies  Allergen Reactions  . Tape Rash and Other (See Comments)    TEARS THE SKIN!!  . Bactrim [Sulfamethoxazole-Trimethoprim] Nausea Only  . Amiodarone Itching  . Amoxicillin Other (See Comments)    Unknown- ? Upset stomach  . Demerol [Meperidine] Other (See Comments)    Hallucinations  . Hydralazine Other (See Comments)    Tingling and chest pain   . Lisinopril Cough  . Mirtazapine Other (See Comments)    Combative  . Zoloft [Sertraline Hcl] Other (See Comments)    Unknown    Narda Bonds 05/14/2019 1:20 AM

## 2019-05-14 NOTE — Progress Notes (Signed)
Pt noted to not have urinated all shift. Bladder scan completed, 209 noted. MD made aware. Pt became agitated upon being moved and repositioned which increased her HR. RN to continue to monitor.

## 2019-05-14 NOTE — ED Notes (Signed)
Attempted to call report, nurse to call me back 

## 2019-05-14 NOTE — Consult Note (Addendum)
Cardiology Consultation:   Patient ID: Brittney Gutierrez; [id number]; 05/02/1927   Admit date: 05/20/2019 Date of Consult: 05/14/2019  Primary Care Provider: Gayland Curry, DO Primary Cardiologist: Sanda Klein, MD Primary Electrophysiologist:  None   Patient Profile:   Brittney Gutierrez is a 85 y.o. female with a PMH of non-obstructive CAD, paroxysmal atrial fibrillation, chronic diastolic CHF, HTN, TIA, and dementia who is being seen today for the evaluation of atrial fibrillation at the request of Dr. Wyline Copas.  History of Present Illness:   Brittney Gutierrez has been residing at a skilled nursing facility and was noted to have melena starting 05/15/2019, for which she was sent to the ED for further evaluation. [name]   She was last seen by cardiology at an outpatient visit with myself 12/2018, at which time she was seen for post hospital follow- after an admission for back pain 2/2 L2 compression fracture. She was doing well from a cardiac standpoint at that time and was without complaints. No medication changes occurred at that visit. Her last echocardiogram was in 2018 and revealed EF 50-55%, no wall motion abnormalities, and mild AI/MR. Her last ischemic evaluation was a LHC in 2017 which showed 30% stenosis in pRCA and mLCx, and 20% ostial LM disease.   The patient is demented and unable to participate in her history taking. Her daughter is at the bedside and reports poor appetite and weight loss over the past 2 weeks. She reported some abdominal pain and diarrhea as well. Recently seen by geriatrics and prescribed remeron for an appetite stimulant, which was stopped after one dose due to side effects. No complaints of chest pain, SOB, palpitations, LE edema, dizziness, lightheadedness, or syncope.   Hospital course: tachycardic to the 140s on arrival, intermittently tachypneic and hypotensive, otherwise VSS. Labs notable for  K 3.7>3.3, Cr 3.41>3.27 (baseline 1.5), WBC 9.2, Hgb 11.8>10.4, PLT 182,  Lactate 2.5, Trop 0.04, INR 1.9, Occult stool +, COVID-19 negative. CXR with cardiomegaly, chronic atelectasis, and possible small underlying pleural effusion. EKG with atrial fibrillation with RVR, rate 134bpm, QTC 511, significant artifact; chronic RBBB, IVCD, and TW abnormalities in anterolateral leads. Patient was started on IV PPI for presumed upper GI bleed and IVF given AoCKD. Patient was admitted to medicine. Eliquis held for GI bleed. GI consulted recommending conservative management with PPI. Started on amiodarone gtt for management of afib RVR.   Past Medical History:  Diagnosis Date  . Abnormal weight loss 05/28/2004  . Anxiety 01/17/2003  . Back pain 08/19/2005   with radiculopathy  . Bradycardia, drug induced    Diltiazem  . Cerebral atherosclerosis 09/24/1999  . Chest pain, atypical 04/2016   minor CAD at cath   . CHF (congestive heart failure) ([place])   . Chronic anticoagulation    Eliquis  . Dementia ([place])    "don't know kind or stage" (05/02/2016)  . Depression 09/06/2002  . Diverticulitis 09/28/2007  . External hemorrhoids 04/12/2009  . Female climacteric state 03/04/2000  . 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 GERD (gastroesophageal reflux disease)   . Hypertensive cardiovascular disease    moderate LVH on echo march 2017  . Macular degeneration   . Malaise and fatigue   . Neurocysticercosis    Craniotomy at [facility] in early 2000s  . Osteoarthrosis, localized   . Osteoporosis 05/28/2004  . Paranoia ([place])   . Paroxysmal A-fib ([place])   . TIA (transient ischemic attack) 2000s?  [name] Urinary  incontinence 08/18/2007  . UTI (urinary tract infection)   . Vaginitis, atrophic   . Vertigo, peripheral 10/07/2000    Past Surgical History:  Procedure Laterality Date  . BRAIN SURGERY  2000   Neurocysticercosis ("parasite")  . CARDIAC CATHETERIZATION N/A 05/03/2016   Procedure: Left Heart Cath and Coronary Angiography;  Surgeon:  Sherren Mocha, MD;  Location: [place] [facility];  Service: Cardiovascular;  Laterality: N/A;  . CARDIOVERSION N/A 07/14/2017   Procedure: CARDIOVERSION;  Surgeon: Pixie Casino, MD;  Location: [facility] ENDOSCOPY;  Service: Cardiovascular;  Laterality: N/A;  . CATARACT EXTRACTION  2013  . CATARACT EXTRACTION, BILATERAL     "not sure if both eyes; feel like it probably was"  . JOINT REPLACEMENT    . PERIPHERAL VASCULAR CATHETERIZATION N/A 05/03/2016   Procedure: Abdominal Aortogram;  Surgeon: Sherren Mocha, MD;  Location: [place] [facility];  Service: Cardiovascular;  Laterality: N/A;  . SHOULDER ARTHROSCOPY W/ ROTATOR CUFF REPAIR Right X 3  . TEE WITHOUT CARDIOVERSION N/A 07/14/2017   Procedure: TRANSESOPHAGEAL ECHOCARDIOGRAM (TEE);  Surgeon: Pixie Casino, MD;  Location: [place];  Service: Cardiovascular;  Laterality: N/A;  . TOTAL KNEE ARTHROPLASTY Left 1990  . VAGINAL HYSTERECTOMY  1960     Home Medications:  Prior to Admission medications   Medication Sig Start Date End Date Taking? Authorizing Provider  acetaminophen (TYLENOL) 325 MG tablet Take 650 mg by mouth every 6 (six) hours as needed for mild pain.   Yes [provider]  amLODipine (NORVASC) 10 MG tablet TAKE 1 TABLET (10 MG TOTAL) BY MOUTH DAILY. 03/03/19  Yes Reed, Tiffany L, DO  Cholecalciferol (VITAMIN D3) 2000 units TABS Take 2,000 Units by mouth daily.    Yes [provider]  docusate sodium (COLACE) 100 MG capsule Take 100 mg by mouth every other day.    Yes [provider]  donepezil (ARICEPT) 10 MG tablet Take 1 tablet (10 mg total) by mouth daily. Patient taking differently: Take 10 mg by mouth at bedtime.  10/01/18  Yes Reed, Tiffany L, DO  ELIQUIS 2.5 MG TABS tablet TAKE 1 TABLET TWICE DAILY Patient taking differently: Take 2.5 mg by mouth 2 (two) times daily.  12/10/18  Yes Croitoru, Mihai, MD  furosemide (LASIX) 40 MG tablet Take 40 mg by mouth daily.   Yes [provider]   losartan (COZAAR) 25 MG tablet TAKE 1 TABLET EVERY DAY Patient taking differently: Take 25 mg by mouth daily.  06/04/18  Yes Eileen Stanford, PA-C  metoprolol tartrate (LOPRESSOR) 25 MG tablet Take 1 tablet (25 mg total) by mouth 2 (two) times daily. 11/30/18  Yes Reed, Tiffany L, DO  venlafaxine (EFFEXOR) 75 MG tablet TAKE 1 TABLET TWICE DAILY Patient taking differently: Take 75 mg by mouth 2 (two) times daily with a meal.  12/10/18  Yes Gayland Curry, DO    Inpatient Medications: Scheduled Meds: . [START ON 05/17/2019] pantoprazole  40 mg Intravenous Q12H  . pantoprazole (PROTONIX) IV  40 mg Intravenous Once   Continuous Infusions: . sodium chloride 100 mL/hr at 05/14/19 0238  . amiodarone 30 mg/hr (05/14/19 1028)  . pantoprozole (PROTONIX) infusion 8 mg/hr (05/14/19 0634)  . piperacillin-tazobactam (ZOSYN)  IV 2.25 g (05/14/19 1030)   PRN Meds: acetaminophen **OR** acetaminophen, diphenhydrAMINE, ondansetron **OR** ondansetron (ZOFRAN) IV  Allergies:    Allergies  Allergen Reactions  . Tape Rash and Other (See Comments)    TEARS THE SKIN!!  . Bactrim [Sulfamethoxazole-Trimethoprim] Nausea Only  .  Amiodarone Itching  . Amoxicillin Other (See Comments)    Unknown- ? Upset stomach  . Demerol [Meperidine] Other (See Comments)    Hallucinations  . Hydralazine Other (See Comments)    Tingling and chest pain   . Lisinopril Cough  . Mirtazapine Other (See Comments)    Combative  . Zoloft [Sertraline Hcl] Other (See Comments)    Unknown    Social History:   Social History   Socioeconomic History  . Marital status: Widowed    Spouse name: Not on file  . Number of children: Not on file  . Years of education: Not on file  . Highest education level: Not on file  Occupational History  . Not on file  Social Needs  . Financial resource strain: Not hard at all  . Food insecurity:    Worry: Never true    Inability: Never true  . Transportation needs:    Medical: No     Non-medical: No  Tobacco Use  . Smoking status: Never Smoker  . Smokeless tobacco: Never Used  Substance and Sexual Activity  . Alcohol use: No    Alcohol/week: 0.0 standard drinks  . Drug use: No  . Sexual activity: Never  Lifestyle  . Physical activity:    Days per week: 7 days    Minutes per session: 20 min  . Stress: To some extent  Relationships  . Social connections:    Talks on phone: More than three times a week    Gets together: More than three times a week    Attends religious service: Never    Active member of club or organization: No    Attends meetings of clubs or organizations: Never    Relationship status: Widowed  . Intimate partner violence:    Fear of current or ex partner: No    Emotionally abused: No    Physically abused: No    Forced sexual activity: No  Other Topics Concern  . Not on file  Social History Narrative   DIET: None      DO YOU DRINK/EAT THINGS WITH CAFFEINE: yes      MARITAL STATUS: widow      WHAT YEAR WERE YOU MARRIED: 1948      DO YOU LIVE IN A HOUSE, APARTMENT, ASSISTED LIVING, CONDO TRAILER ETC.: Independent Living      IS IT ONE OR MORE STORIES: 1 story      HOW MANY PERSONS LIVE IN YOUR HOME: 1      DO YOU HAVE PETS IN YOUR HOME: no      CURRENT OR PAST PROFESSION: Book Keeper      DO YOU EXERCISE: no      WHAT TYPE AND HOW OFTEN:    Family History:    Family History  Problem Relation Age of Onset  . Polycystic kidney disease Mother   . Heart attack Father   . Hypertension Sister   . Dementia Neg Hx      ROS:  Please see the history of present illness.   All other ROS reviewed and negative.     Physical Exam/Data:   Vitals:   05/14/19 0701 05/14/19 0753 05/14/19 1000 05/14/19 1031  BP: 109/63 106/68 (!) 105/51 101/77  Pulse: (!) 123 60 (!) 120 80  Resp: (!) 30 (!) 23 17 (!) 23  Temp:      TempSrc:      SpO2: 99% 98% 98% 99%  Weight:      Height:  Intake/Output Summary (Last 24 hours) at  05/14/2019 1119 Last data filed at 05/14/2019 0538 Gross per 24 hour  Intake -  Output 1 ml  Net -1 ml   Filed Weights   05/14/19 0030 05/14/19 0519  Weight: 57.4 kg 57.4 kg   Body mass index is 23.91 kg/m.  General:  Well nourished, well developed, in no acute distress; daughter is feeding her a full liquid lunch HEENT: sclera anicteric  Lymph: no adenopathy Neck: no JVD Endocrine:  No thryomegaly Vascular: No carotid bruits; distal pulses 2+ bilaterally Cardiac:  normal S1, S2; IRIR; no murmurs, rubs, or gallops Lungs:  clear to auscultation bilaterally, no wheezing, rhonchi or rales  Abd: NABS, soft, nontender, no hepatomegaly Ext: no edema Musculoskeletal:  No deformities, BUE and BLE strength normal and equal Skin: warm and dry  Neuro:  CNs 2-12 intact, no focal abnormalities noted Psych:  Normal affect   EKG:  The EKG was personally reviewed and demonstrates:  Atrial fibrillation with RVR, rate 134bpm, QTC 511, significant artifact; chronic RBBB, IVCD, and TW abnormalities in anterolateral leads Telemetry:  Telemetry was personally reviewed and demonstrates:  Atrial fibrillation with RVR - rates improved to 100s-110s this afternoon.   Relevant CV Studies:  Echocardiogram 2018: Study Conclusions  - Left ventricle: The cavity size was normal. Wall thickness was   increased in a pattern of moderate LVH. Systolic function was   normal. The estimated ejection fraction was in the range of 50%   to 55%. Wall motion was normal; there were no regional wall   motion abnormalities. The study is not technically sufficient to   allow evaluation of LV diastolic function. - Aortic valve: There was mild regurgitation. - Mitral valve: Calcified annulus. There was mild regurgitation. - Left atrium: The atrium was moderately dilated. - Pulmonary arteries: Systolic pressure was mildly to moderately   increased. PA peak pressure: 48 mm Hg (S).  Impressions:  - Normal LV systolic  function; moderate LVH; sclerotic aortic valve   with mild AI; mild MR; moderate LAE; mild TR with mild to   moderate elevation in pulmonary pressure.  Left heart catheterization 2017:  Prox RCA lesion, 30% stenosed.  Ost LM lesion, 20% stenosed.  Mid Cx lesion, 30% stenosed.   1. Patent coronary arteries with mild diffuse coronary artery disease 2. Mild to moderate right renal artery stenosis, widely patent left renal artery 3. Normal LVEDP  Recommendations: Medical therapy for severe hypertension  Laboratory Data:  Chemistry Recent Labs  Lab 05/15/2019 2202 06/01/2019 2216 05/14/19 0617  NA 137 135 138  K 3.7 3.6 3.3*  CL 101 103 105  CO2 18*  --  19*  GLUCOSE 134* 131* 82  BUN 81* 77* 75*  CREATININE 3.41* 3.50* 3.27*  CALCIUM 9.4  --  8.6*  GFRNONAA 11*  --  12*  GFRAA 13*  --  14*  ANIONGAP 18*  --  14    Recent Labs  Lab 05/26/2019 2202  PROT 7.0  ALBUMIN 3.5  AST 33  ALT 129*  ALKPHOS 93  BILITOT 1.1   Hematology Recent Labs  Lab 05/31/2019 2202 06/04/2019 2216 05/14/19 0617  WBC 9.2  --  7.1  RBC 3.92  --  3.48*  HGB 11.8* 12.6 10.4*  HCT 37.3 37.0 32.6*  MCV 95.2  --  93.7  MCH 30.1  --  29.9  MCHC 31.6  --  31.9  RDW 15.5  --  15.4  PLT 219  --  182   Cardiac EnzymesNo results for input(s): TROPONINI in the last 168 hours.  Recent Labs  Lab 05/24/2019 2215  TROPIPOC 0.04    BNPNo results for input(s): BNP, PROBNP in the last 168 hours.  DDimer No results for input(s): DDIMER in the last 168 hours.  Radiology/Studies:  Dg Chest Port 1 View  Result Date: 05/14/2019 CLINICAL DATA:  Central line placement EXAM: PORTABLE CHEST 1 VIEW COMPARISON:  Yesterday FINDINGS: New right IJ line with tip at the SVC.  No pneumothorax. Cardiomegaly distorted by rightward rotation. There is a new infiltrate at the left base. Remote nonunited right mid clavicle fracture. IMPRESSION: 1. New central line without complicating feature. 2. Probable left lower lobe  pneumonia or aspiration. Electronically Signed   By: Monte Fantasia M.D.   On: 05/14/2019 06:45   Dg Chest Port 1 View  Result Date: 05/15/2019 CLINICAL DATA:  Shortness of breath EXAM: PORTABLE CHEST 1 VIEW COMPARISON:  12/13/2017 FINDINGS: Evaluation is limited by patient positioning. The cardiac silhouette is enlarged. There is a left basilar airspace opacity. No pneumothorax. An old displaced right clavicle fracture is noted. There are advanced degenerative changes of the right glenohumeral joint. A background of emphysematous changes is suspected. IMPRESSION: 1. Cardiomegaly. 2. Persistent but improved opacity at the left lung base favored to represent chronic atelectasis or scarring. There may be a small underlying pleural effusion. Electronically Signed   By: Constance Holster M.D.   On: 06/01/2019 22:32    Assessment and Plan:   1. Atrial fibrillation with RVR: patient presented with HR in the 140s and EKG with afib RVR. Known history of paroxysmal afib. BP soft limiting use of AV nodal blocking agents. She was started on an amiodarone gtt this morning for rhythm management with improvement in rate from 140s to 110s. CHA2DS2-VASc Score is 8 (CHF, HTN, Vasc, Female, Age >51, and TIA). Unfortunately she presented with a GI bleed and AoCKD, so home eliquis is being held. Likely RVR driven by GI bleed and metabolic dysfunction.  - Continue amiodarone gtt  - She has an elevated CHA2DS2-VASc Score with a 10.8% risk of stroke/year and would ultimately benefit from being on an anticoagulant once GI bleed resolves.  - If Cr does not recover, may need to consider alternative to DOAC - likely coumadin.   2. Acute GI bleed: patient presented with melena. Occult stool +. Hgb 11.8>10.4 (likely dilutional given IVF resuscitation). She was started on an IV PPI for presumed upper GI bleed. GI consulted and recommending conservative management at this time - Continue management per primary team and GI  3.  Chronic diastolic CHF: CXR with congestion. No CHF complaints and she appears euvolemic on exam.  - Monitor volume status closely with IVF resuscitation  4. HTN: BP soft this admission. Home amlodipine, metoprolol, losartan, and lasix on hold.  - Continue to hold for now. Restart medications as BP allows  5. Non-obstructive CAD: Trop 0.04 - likely mild elevation is demand ischemia in the setting of acute GI bleed and AoCKD. No anginal complaints and EKG appears non-ischemic. Not on ASA given need for anticoagulation.  - No further cardiac testing at this time.   6. SSS: patient has refused PPM in the past.  - Continue to monitor - anticipate some bradycardia if patient converts to sinus rhythm.   7. Acute on CKD stage 3: Cr elevated to 3.4 on admission, baseline 1.5. Possible this has been contributed to by poor po intake.  - Continue  gentle hydration - Continue to monitor Cr closely - May need to consider alternative anticoagulation if GI bleed resolves and Cr does not recover.   For questions or updates, please contact [place] Please consult [email or web address] for contact info under Cardiology/STEMI.   Signed, Abigail Butts, PA-C  05/14/2019 11:19 AM [phone]  History and all data above reviewed.  Patient examined.  I agree with the findings as above.  The patient is mildly agitated.  No acute distress.  Not able to participate with exam or history. Her daughter has only seen her from a distance or virtually but says that she has not been doing well since the virus outbreak.  She has had progressive weakness and not been eating. The patient exam reveals ELF:[id number]  ,  Lungs: Decreased breath sounds  ,  Abd: Positive bowel sounds, no rebound no guarding, Ext No edema  .  All available labs, radiology testing, previous records reviewed. Agree with documented assessment and plan.   Atrial fib with RVR:  Options for treatment are very limited.  It should be noted that she did not  tolerate amiodarone before secondary to itching.  For now continue IV amio.  It would likely be best when her acute issues are improved and her BP allows to try to transition back to PO beta blocker.    Jeneen Rinks Staci Dack  3:24 PM  05/14/2019

## 2019-05-14 NOTE — Procedures (Signed)
Central Venous Catheter Insertion Procedure Note Brittney Gutierrez [id number] 02-16-1927  Procedure: Insertion of Central Venous Catheter Indications: Drug and/or fluid administration, Frequent blood sampling and Poor peripheral access, AKI  Procedure Details Consent: Risks of procedure as well as the alternatives and risks of each were explained to the (patient/caregiver).  Consent for procedure obtained. Time Out: Verified patient identification, verified procedure, site/side was marked, verified correct patient position, special equipment/implants available, medications/allergies/relevent history reviewed, required imaging and test results available.  Performed  Maximum sterile technique was used including antiseptics, cap, gloves, gown, hand hygiene, mask and sheet. Skin prep: Chlorhexidine; local anesthetic administered 4 ml Lido 1% A antimicrobial bonded/coated triple lumen catheter was placed in the right internal jugular vein using the Seldinger technique to 16 cm.  Line sutured.  Biopatch placed and sterile dressing applied.   Evaluation Blood flow good Complications: No apparent complications Patient did tolerate procedure well. Chest X-ray ordered to verify placement.  CXR: pending.  Procedure performed with ultrasound guidance for real time vessel cannulation.     Waunita Schooner, RRT RN at bedside throughout procedure for assistance and patient monitored continuously on telemetry and continuous pulse ox.    Kennieth Rad, MSN, AGACNP-BC [facility] Pgr: [phone] or if no answer [phone] 05/14/2019, 6:12 AM

## 2019-05-14 NOTE — H&P (Signed)
History and Physical    Brittney Gutierrez QHU:[id number] DOB: 1927/04/22 DOA: 05/11/2019  PCP: Gayland Curry, DO  Patient coming from: [place].  Chief Complaint: GI bleed elevated heart rate.  History obtained from patient's daughter.  Patient is demented and does not contribute to the history.  HPI: Brittney Gutierrez is a 85 y.o. female with history of paroxysmal atrial fibrillation, diastolic CHF, hypertension, dementia was found to have had multiple episodes of loose bowel stools melanotic in appearance yesterday at her living facility.  Patient also was complaining of lower abdominal discomfort.  Patient's heart rate was found to be elevated.  Per daughter patient has not been doing well last 2 weeks with poor appetite and had to have some person sit with her to eat.  Despite which patient still was not eating well.  Patient had not complained of any chest pain or shortness of breath.  Patient is on Eliquis for A. fib.  ED Course: In the ER patient is found to be in A. fib with RVR blood pressure with pain to low normal.  Labs revealed creatinine of 3.4 which increased from 1.46 months ago.  Hemoglobin was 11.8 and patient does have chronic anemia hemoglobin stands around 10.  Stool for occult blood was positive.  ER physician discussed with on-call gastroenterologist who will be seeing patient in consult.  Patient started on Protonix.  IV fluid bolus for acute renal failure with low normal blood pressure and also received patient heart rate improved with it.  Review of Systems: As per HPI, rest all negative.   Past Medical History:  Diagnosis Date  . Abnormal weight loss 05/28/2004  . Anxiety 01/17/2003  . Back pain 08/19/2005   with radiculopathy  . Bradycardia, drug induced    Diltiazem  . Cerebral atherosclerosis 09/24/1999  . Chest pain, atypical 04/2016   minor CAD at cath   . CHF (congestive heart failure) ([place])   . Chronic anticoagulation    Eliquis  .  Dementia ([place])    "don't know kind or stage" (05/02/2016)  . Depression 09/06/2002  . Diverticulitis 09/28/2007  . External hemorrhoids 04/12/2009  . Female climacteric state 03/04/2000  . 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 GERD (gastroesophageal reflux disease)   . Hypertensive cardiovascular disease    moderate LVH on echo march 2017  . Macular degeneration   . Malaise and fatigue   . 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 Osteoarthrosis, localized   . Osteoporosis 05/28/2004  . Paranoia ([place])   . Paroxysmal A-fib ([place])   . TIA (transient ischemic attack) 2000s?  [name] Urinary incontinence 08/18/2007  . UTI (urinary tract infection)   . Vaginitis, atrophic   . Vertigo, peripheral 10/07/2000    Past Surgical History:  Procedure Laterality Date  . BRAIN SURGERY  2000   Neurocysticercosis ("parasite")  . CARDIAC CATHETERIZATION N/A 05/03/2016   Procedure: Left Heart Cath and Coronary Angiography;  Surgeon: Sherren Mocha, MD;  Location: [facility];  Service: Cardiovascular;  Laterality: N/A;  . CARDIOVERSION N/A 07/14/2017   Procedure: CARDIOVERSION;  Surgeon: Pixie Casino, MD;  Location[facility] ENDOSCOPY;  Service: Cardiovascular;  Laterality: N/A;  . CATARACT EXTRACTION  2013  . CATARACT EXTRACTION, BILATERAL     "not sure if both eyes; feel like it probably was"  . JOINT REPLACEMENT    . PERIPHERAL VASCULAR CATHETERIZATION N/A 05/03/2016  Procedure: Abdominal Aortogram;  Surgeon: Sherren Mocha, MD;  Location: [facility];  Service: Cardiovascular;  Laterality: N/A;  . SHOULDER ARTHROSCOPY W/ ROTATOR CUFF REPAIR Right X 3  . TEE WITHOUT CARDIOVERSION N/A 07/14/2017   Procedure: TRANSESOPHAGEAL ECHOCARDIOGRAM (TEE);  Surgeon: Pixie Casino, MD;  Location: [facility];  Service: Cardiovascular;  Laterality: N/A;  . TOTAL KNEE ARTHROPLASTY Left 1990  . VAGINAL HYSTERECTOMY  1960     reports  that she has never smoked. She has never used smokeless tobacco. She reports that she does not drink alcohol or use drugs.  Allergies  Allergen Reactions  . Tape Rash and Other (See Comments)    TEARS THE SKIN!!  . Bactrim [Sulfamethoxazole-Trimethoprim] Nausea Only  . Amiodarone Itching  . Amoxicillin Other (See Comments)    Unknown- ? Upset stomach  . Demerol [Meperidine] Other (See Comments)    Hallucinations  . Hydralazine Other (See Comments)    Tingling and chest pain   . Lisinopril Cough  . Mirtazapine Other (See Comments)    Combative  . Zoloft [Sertraline Hcl] Other (See Comments)    Unknown    Family History  Problem Relation Age of Onset  . Polycystic kidney disease Mother   . Heart attack Father   . Hypertension Sister   . Dementia Neg Hx     Prior to Admission medications   Medication Sig Start Date End Date Taking? Authorizing Provider  acetaminophen (TYLENOL) 325 MG tablet Take 650 mg by mouth every 6 (six) hours as needed for mild pain.    [provider]  amLODipine (NORVASC) 10 MG tablet TAKE 1 TABLET (10 MG TOTAL) BY MOUTH DAILY. 03/03/19   Reed, Tiffany L, DO  Cholecalciferol (VITAMIN D3) 2000 units TABS Take 2,000 Units by mouth daily.     [provider]  docusate sodium (COLACE) 100 MG capsule Take 100 mg by mouth every other day.     [provider]  donepezil (ARICEPT) 10 MG tablet Take 1 tablet (10 mg total) by mouth daily. 10/01/18   Reed, Tiffany L, DO  ELIQUIS 2.5 MG TABS tablet TAKE 1 TABLET TWICE DAILY 12/10/18   Croitoru, Mihai, MD  furosemide (LASIX) 40 MG tablet Take 40 mg by mouth daily.    [provider]  losartan (COZAAR) 25 MG tablet TAKE 1 TABLET EVERY DAY 06/04/18   Eileen Stanford, PA-C  metoprolol tartrate (LOPRESSOR) 25 MG tablet Take 1 tablet (25 mg total) by mouth 2 (two) times daily. 11/30/18   Reed, Tiffany L, DO  venlafaxine (EFFEXOR) 75 MG tablet TAKE 1 TABLET TWICE DAILY 12/10/18   Gayland Curry, DO    Physical Exam: Vitals:   05/21/2019 2300 05/31/2019 2305 06/01/2019 2310 05/30/2019 2315  BP: 102/89 (!) 106/94 97/76 105/67  Pulse:  (!) 116 (!) 121 (!) 112  Resp: (!) 24 19 19 17   SpO2:  100% 94%       Constitutional: Moderately built and nourished. Vitals:   05/31/2019 2300 05/12/2019 2305 05/15/2019 2310 06/06/2019 2315  BP: 102/89 (!) 106/94 97/76 105/67  Pulse:  (!) 116 (!) 121 (!) 112  Resp: (!) 24 19 19 17   SpO2:  100% 94%    Eyes: Anicteric no pallor. ENMT: No discharge from the ears eyes nose and mouth. Neck: No mass felt.  No neck rigidity. Respiratory: No rhonchi or crepitations. Cardiovascular: S1-S2 heard. Abdomen: Soft nontender bowel sounds present. Musculoskeletal: No edema. Skin: No rash. Neurologic: Alert awake oriented  to her name.  Follows commands moves all extremities. Psychiatric: Oriented to her name only.   Labs on Admission: I have personally reviewed following labs and imaging studies  CBC: Recent Labs  Lab 06/03/2019 2202 05/12/2019 2216  WBC 9.2  --   NEUTROABS 7.7  --   HGB 11.8* 12.6  HCT 37.3 37.0  MCV 95.2  --   PLT 219  --    Basic Metabolic Panel: Recent Labs  Lab 05/20/2019 2202 06/01/2019 2216  NA 137 135  K 3.7 3.6  CL 101 103  CO2 18*  --   GLUCOSE 134* 131*  BUN 81* 77*  CREATININE 3.41* 3.50*  CALCIUM 9.4  --    GFR: Estimated Creatinine Clearance: 8.2 mL/min (A) (by C-G formula based on SCr of 3.5 mg/dL (H)). Liver Function Tests: Recent Labs  Lab 05/26/2019 2202  AST 33  ALT 129*  ALKPHOS 93  BILITOT 1.1  PROT 7.0  ALBUMIN 3.5   Recent Labs  Lab 05/20/2019 2202  LIPASE 43   No results for input(s): AMMONIA in the last 168 hours. Coagulation Profile: Recent Labs  Lab 05/19/2019 2209  INR 1.9*   Cardiac Enzymes: No results for input(s): CKTOTAL, CKMB, CKMBINDEX, TROPONINI in the last 168 hours. BNP (last 3 results) No results for input(s): PROBNP in the last 8760 hours. HbA1C: No results for  input(s): HGBA1C in the last 72 hours. CBG: No results for input(s): GLUCAP in the last 168 hours. Lipid Profile: No results for input(s): CHOL, HDL, LDLCALC, TRIG, CHOLHDL, LDLDIRECT in the last 72 hours. Thyroid Function Tests: No results for input(s): TSH, T4TOTAL, FREET4, T3FREE, THYROIDAB in the last 72 hours. Anemia Panel: No results for input(s): VITAMINB12, FOLATE, FERRITIN, TIBC, IRON, RETICCTPCT in the last 72 hours. Urine analysis:    Component Value Date/Time   COLORURINE YELLOW 11/26/2018 2237   APPEARANCEUR CLEAR 11/26/2018 2237   LABSPEC 1.010 11/26/2018 2237   PHURINE 7.0 11/26/2018 2237   GLUCOSEU NEGATIVE 11/26/2018 2237   HGBUR MODERATE (A) 11/26/2018 2237   BILIRUBINUR neg 05/04/2019 1234   KETONESUR NEGATIVE 11/26/2018 2237   PROTEINUR Negative 05/04/2019 1234   PROTEINUR NEGATIVE 11/26/2018 2237   UROBILINOGEN 0.2 05/04/2019 1234   NITRITE neg 05/04/2019 1234   NITRITE NEGATIVE 11/26/2018 2237   LEUKOCYTESUR Negative 05/04/2019 1234   Sepsis Labs: @LABRCNTIP (procalcitonin:4,lacticidven:4) ) Recent Results (from the past 240 hour(s))  SARS Coronavirus 2 (CEPHEID - Performed in [facility] hospital lab), Hosp Order     Status: None   Collection Time: 05/21/2019 10:29 PM  Result Value Ref Range Status   SARS Coronavirus 2 NEGATIVE NEGATIVE Final    Comment: (NOTE) If result is NEGATIVE SARS-CoV-2 target nucleic acids are NOT DETECTED. The SARS-CoV-2 RNA is generally detectable in upper and lower  respiratory specimens during the acute phase of infection. The lowest  concentration of SARS-CoV-2 viral copies this assay can detect is 250  copies / mL. A negative result does not preclude SARS-CoV-2 infection  and should not be used as the sole basis for treatment or other  patient management decisions.  A negative result may occur with  improper specimen collection / handling, submission of specimen other  than nasopharyngeal swab, presence of viral  mutation(s) within the  areas targeted by this assay, and inadequate number of viral copies  (<250 copies / mL). A negative result must be combined with clinical  observations, patient history, and epidemiological information. If result is POSITIVE SARS-CoV-2 target nucleic acids are  DETECTED. The SARS-CoV-2 RNA is generally detectable in upper and lower  respiratory specimens dur ing the acute phase of infection.  Positive  results are indicative of active infection with SARS-CoV-2.  Clinical  correlation with patient history and other diagnostic information is  necessary to determine patient infection status.  Positive results do  not rule out bacterial infection or co-infection with other viruses. If result is PRESUMPTIVE POSTIVE SARS-CoV-2 nucleic acids MAY BE PRESENT.   A presumptive positive result was obtained on the submitted specimen  and confirmed on repeat testing.  While 2019 novel coronavirus  (SARS-CoV-2) nucleic acids may be present in the submitted sample  additional confirmatory testing may be necessary for epidemiological  and / or clinical management purposes  to differentiate between  SARS-CoV-2 and other Sarbecovirus currently known to infect humans.  If clinically indicated additional testing with an alternate test  methodology [phone]) is advised. The SARS-CoV-2 RNA is generally  detectable in upper and lower respiratory sp ecimens during the acute  phase of infection. The expected result is Negative. Fact Sheet for Patients:  StrictlyIdeas.no Fact Sheet for Healthcare Providers: BankingDealers.co.za This test is not yet approved or cleared by the [place] FDA and has been authorized for detection and/or diagnosis of SARS-CoV-2 by FDA under an Emergency Use Authorization (EUA).  This EUA will remain in effect (meaning this test can be used) for the duration of the COVID-19 declaration under Section 564(b)(1)  of the Act, 21 U.S.C. section 360bbb-3(b)(1), unless the authorization is terminated or revoked sooner. Performed at [facility], [place] [address], [place], [place] [address]      Radiological Exams on Admission: Dg Chest Port 1 View  Result Date: 05/15/2019 CLINICAL DATA:  Shortness of breath EXAM: PORTABLE CHEST 1 VIEW COMPARISON:  12/13/2017 FINDINGS: Evaluation is limited by patient positioning. The cardiac silhouette is enlarged. There is a left basilar airspace opacity. No pneumothorax. An old displaced right clavicle fracture is noted. There are advanced degenerative changes of the right glenohumeral joint. A background of emphysematous changes is suspected. IMPRESSION: 1. Cardiomegaly. 2. Persistent but improved opacity at the left lung base favored to represent chronic atelectasis or scarring. There may be a small underlying pleural effusion. Electronically Signed   By: Constance Holster M.D.   On: 05/19/2019 22:32    EKG: Independently reviewed.  A. fib with RVR.  Assessment/Plan Principal Problem:   Acute GI bleeding Active Problems:   Dementia (HCC)   SSS (sick sinus syndrome) (HCC)   Atrial fibrillation with RVR (HCC)   ARF (acute renal failure) ([place])    1. Acute GI bleeding -not sure with upper or lower GI.  Given that patient had melanotic stools will keep patient on Protonix infusion transfuse given the low normal blood pressure and tachycardia.  GI has been consulted.  Hold Eliquis.  ER physician had ordered reversal of Eliquis with Eppie Gibson which has been given. 2. A. fib with RVR -IV fluid bolus has been ordered.  If heart rate does not improve will start amiodarone drip.  Patient's Eliquis is on hold and patient did receive Kcentra in the ER due to GI bleed. 3. Acute renal failure likely from poor oral intake and GI bleed.  Continue fluids as tolerated recheck metabolic panel and patient is also receiving blood transfusion. 4. Abdominal pain -Per daughter patient  was complaining of abdominal pain yesterday.  Abdomen on exam appears benign.  Likely will need a CT scan when patient is more stable.  5. History of CHF presently patient is a low normal blood pressure which patient is receiving fluids. 6. Dementia. 7. Hypertension holding antihypertensives. 8. Acute blood loss anemia follow CBC receiving blood transfusion.   DVT prophylaxis: SCDs. Code Status: DNR as confirmed with patient's daughter. Family Communication: Patient's daughter. Disposition Plan: Back to facility when stable. Consults called: Gastroenterology. Admission status: Inpatient.   Rise Patience MD [facility] Pager [phone].  If 7PM-7AM, please contact night-coverage [email or web address] Password [email or web address]  05/14/2019, 12:01 AM

## 2019-05-14 NOTE — ED Notes (Signed)
ED TO INPATIENT HANDOFF REPORT  ED Nurse Name and Phone #: [phone] - [place] Name/Age/Gender Brittney Gutierrez 85 y.o. female Room/Bed: 015C/015C  Code Status   Code Status: DNR  Home/SNF/Other [facility] [facility] - has Home health aid 4 hours per day Patient oriented to: self Is this baseline? Yes   Triage Complete: Triage complete  Chief Complaint GI bleed   Triage Note Pt arrives via ems from home with uncontrolled afib and gi bleed. Per ems, pt with diarrhea that was dark red. Pt with hx of afib on eliquis. Hr 130-180 afib, 112/67, 100% RA.    Allergies Allergies  Allergen Reactions  . Tape Rash and Other (See Comments)    TEARS THE SKIN!!  . Bactrim [Sulfamethoxazole-Trimethoprim] Nausea Only  . Amiodarone Itching  . Amoxicillin Other (See Comments)    Unknown- ? Upset stomach  . Demerol [Meperidine] Other (See Comments)    Hallucinations  . Hydralazine Other (See Comments)    Tingling and chest pain   . Lisinopril Cough  . Mirtazapine Other (See Comments)    Combative  . Zoloft [Sertraline Hcl] Other (See Comments)    Unknown    Level of Care/Admitting Diagnosis ED Disposition    ED Disposition Condition Comment   Admit  Hospital Area: [facility] [[id number]]  Level of Care: Progressive [102]  Covid Evaluation: Screening Protocol (No Symptoms)  Diagnosis: Acute GI bleeding [833582]  Admitting Physician: Beryle Flock  Attending Physician: Rise Patience [phone]  Estimated length of stay: past midnight tomorrow  Certification:: I certify this patient will need inpatient services for at least 2 midnights  PT Class (Do Not Modify): Inpatient [101]  PT Acc Code (Do Not Modify): Private [1]       B Medical/Surgery History Past Medical History:  Diagnosis Date  . Abnormal weight loss 05/28/2004  . Anxiety 01/17/2003  . Back pain 08/19/2005   with radiculopathy  . Bradycardia, drug induced    Diltiazem   . Cerebral atherosclerosis 09/24/1999  . Chest pain, atypical 04/2016   minor CAD at cath   . CHF (congestive heart failure) ([place])   . Chronic anticoagulation    Eliquis  . Dementia ([place])    "don't know kind or stage" (05/02/2016)  . Depression 09/06/2002  . Diverticulitis 09/28/2007  . External hemorrhoids 04/12/2009  . Female climacteric state 03/04/2000  . 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 GERD (gastroesophageal reflux disease)   . Hypertensive cardiovascular disease    moderate LVH on echo march 2017  . Macular degeneration   . Malaise and fatigue   . 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 Osteoarthrosis, localized   . Osteoporosis 05/28/2004  . Paranoia ([place])   . Paroxysmal A-fib ([place])   . TIA (transient ischemic attack) 2000s?  [name] Urinary incontinence 08/18/2007  . UTI (urinary tract infection)   . Vaginitis, atrophic   . Vertigo, peripheral 10/07/2000   Past Surgical History:  Procedure Laterality Date  . BRAIN SURGERY  2000   Neurocysticercosis ("parasite")  . CARDIAC CATHETERIZATION N/A 05/03/2016   Procedure: Left Heart Cath and Coronary Angiography;  Surgeon: Sherren Mocha, MD;  Location: [place] CV LAB;  Service: Cardiovascular;  Laterality: N/A;  . CARDIOVERSION N/A 07/14/2017   Procedure: CARDIOVERSION;  Surgeon: Pixie Casino, MD;  Location: [facility] ENDOSCOPY;  Service: Cardiovascular;  Laterality: N/A;  . CATARACT  EXTRACTION  2013  . CATARACT EXTRACTION, BILATERAL     "not sure if both eyes; feel like it probably was"  . JOINT REPLACEMENT    . PERIPHERAL VASCULAR CATHETERIZATION N/A 05/03/2016   Procedure: Abdominal Aortogram;  Surgeon: Sherren Mocha, MD;  Location: [place] CV LAB;  Service: Cardiovascular;  Laterality: N/A;  . SHOULDER ARTHROSCOPY W/ ROTATOR CUFF REPAIR Right X 3  . TEE WITHOUT CARDIOVERSION N/A 07/14/2017   Procedure: TRANSESOPHAGEAL ECHOCARDIOGRAM (TEE);  Surgeon:  Pixie Casino, MD;  Location: [place];  Service: Cardiovascular;  Laterality: N/A;  . TOTAL KNEE ARTHROPLASTY Left 1990  . VAGINAL HYSTERECTOMY  1960     A IV Location/Drains/Wounds Patient Lines/Drains/Airways Status   Active Line/Drains/Airways    Name:   Placement date:   Placement time:   Site:   Days:   Peripheral IV 12/13/17 Left Hand   12/13/17    1707    Hand   517   Peripheral IV 11/26/18 Right Other (Comment)   11/26/18    2057    Other (Comment)   169   Peripheral IV 05/11/2019 Left Antecubital   05/18/2019    2147    Antecubital   1   Peripheral IV 06/02/2019 Right Antecubital   05/15/2019    2148    Antecubital   1   External Urinary Catheter   11/27/18    0200    -   168          Intake/Output Last 24 hours No intake or output data in the 24 hours ending 05/14/19 0026  Labs/Imaging Results for orders placed or performed during the hospital encounter of 05/28/2019 (from the past 48 hour(s))  CBC with Differential/Platelet     Status: Abnormal   Collection Time: 06/01/2019 10:02 PM  Result Value Ref Range   WBC 9.2 4.0 - 10.5 K/uL   RBC 3.92 3.87 - 5.11 MIL/uL   Hemoglobin 11.8 (L) 12.0 - 15.0 g/dL   HCT 37.3 36.0 - 46.0 %   MCV 95.2 80.0 - 100.0 fL   MCH 30.1 26.0 - 34.0 pg   MCHC 31.6 30.0 - 36.0 g/dL   RDW 15.5 11.5 - 15.5 %   Platelets 219 150 - 400 K/uL   nRBC 0.0 0.0 - 0.2 %   Neutrophils Relative % 85 %   Neutro Abs 7.7 1.7 - 7.7 K/uL   Lymphocytes Relative 7 %   Lymphs Abs 0.7 0.7 - 4.0 K/uL   Monocytes Relative 8 %   Monocytes Absolute 0.7 0.1 - 1.0 K/uL   Eosinophils Relative 0 %   Eosinophils Absolute 0.0 0.0 - 0.5 K/uL   Basophils Relative 0 %   Basophils Absolute 0.0 0.0 - 0.1 K/uL   Immature Granulocytes 0 %   Abs Immature Granulocytes 0.04 0.00 - 0.07 K/uL    Comment: Performed at [facility], [address] [place], [place] [address]  Comprehensive metabolic panel     Status: Abnormal   Collection Time: 06/04/2019 10:02 PM  Result  Value Ref Range   Sodium 137 135 - 145 mmol/L   Potassium 3.7 3.5 - 5.1 mmol/L   Chloride 101 98 - 111 mmol/L   CO2 18 (L) 22 - 32 mmol/L   Glucose, Bld 134 (H) 70 - 99 mg/dL   BUN 81 (H) 8 - 23 mg/dL   Creatinine, Ser 3.41 (H) 0.44 - 1.00 mg/dL   Calcium 9.4 8.9 - 10.3 mg/dL   Total  Protein 7.0 6.5 - 8.1 g/dL   Albumin 3.5 3.5 - 5.0 g/dL   AST 33 15 - 41 U/L   ALT 129 (H) 0 - 44 U/L   Alkaline Phosphatase 93 38 - 126 U/L   Total Bilirubin 1.1 0.3 - 1.2 mg/dL   GFR calc non Af Amer 11 (L) >60 mL/min   GFR calc Af Amer 13 (L) >60 mL/min   Anion gap 18 (H) 5 - 15    Comment: Performed at [facility] [address] [place], [place] [address]  Type and screen     Status: None   Collection Time: 05/28/2019 10:02 PM  Result Value Ref Range   ABO/RH(D) A POS    Antibody Screen NEG    Sample Expiration      06-08-19,2359 Performed at [facility], [place] [address] [place], [place] [address]    Unit Number [id number]    Blood Component Type RBC, LR IRR    Unit division 00    Status of Unit REL FROM [facility]    Transfusion Status OK TO TRANSFUSE    Crossmatch Result Compatible   Lipase, blood     Status: None   Collection Time: 06/02/2019 10:02 PM  Result Value Ref Range   Lipase 43 11 - 51 U/L    Comment: Performed at [facility], [place] [address] [place], [place] [address]  Lactic acid, plasma     Status: Abnormal   Collection Time: 05/27/2019 10:02 PM  Result Value Ref Range   Lactic Acid, Venous 2.5 (HH) 0.5 - 1.9 mmol/L    Comment: CRITICAL RESULT CALLED TO, READ BACK BY AND VERIFIED WITH: BAIN C,RN 05/18/2019 2245 WAYK Performed at [place] [facility], [place] [address], [place], [place] [address]   ABO/Rh     Status: None   Collection Time: 06/01/2019 10:02 PM  Result Value Ref Range   ABO/RH(D)      A POS Performed at [facility] [address], [place], [place] [address]   Protime-INR     Status: Abnormal   Collection Time: 06/07/2019 10:09 PM   Result Value Ref Range   Prothrombin Time 21.2 (H) 11.4 - 15.2 seconds   INR 1.9 (H) 0.8 - 1.2    Comment: (NOTE) INR goal varies based on device and disease states. Performed at [facility], [place] [address], [place], [place] [address]   APTT     Status: None   Collection Time: 05/23/2019 10:09 PM  Result Value Ref Range   aPTT 33 24 - 36 seconds    Comment: Performed at [facility] [address], [place], [place] [address]  Heparin level - if patient on rivaroxaban Alveda Reasons) or apixaban Arne Cleveland)     Status: Abnormal   Collection Time: 05/12/2019 10:09 PM  Result Value Ref Range   Heparin Unfractionated >2.20 (H) 0.30 - 0.70 IU/mL    Comment: RESULTS CONFIRMED BY MANUAL DILUTION (NOTE) If heparin results are below expected values, and patient dosage has  been confirmed, suggest follow up testing of antithrombin III levels. Performed at [facility], [place] [address], [place], [place] [address]   I-stat troponin, ED     Status: None   Collection Time: 05/27/2019 10:15 PM  Result Value Ref Range   Troponin i, poc 0.04 0.00 - 0.08 ng/mL   Comment 3            Comment:  Due to the release kinetics of cTnI, a negative result within the first hours of the onset of symptoms does not rule out myocardial infarction with certainty. If myocardial infarction is still suspected, repeat the test at appropriate intervals.   POC occult blood, ED     Status: Abnormal   Collection Time: 06/04/2019 10:15 PM  Result Value Ref Range   Fecal Occult Bld POSITIVE (A) NEGATIVE  I-stat chem 8, ED (not at [facility] or [facility])     Status: Abnormal   Collection Time: 05/21/2019 10:16 PM  Result Value Ref Range   Sodium 135 135 - 145 mmol/L   Potassium 3.6 3.5 - 5.1 mmol/L   Chloride 103 98 - 111 mmol/L   BUN 77 (H) 8 - 23 mg/dL   Creatinine, Ser 3.50 (H) 0.44 - 1.00 mg/dL   Glucose, Bld 131 (H) 70 - 99 mg/dL   Calcium, Ion 1.05 (L) 1.15 - 1.40 mmol/L   TCO2 20 (L) 22 - 32 mmol/L   Hemoglobin  12.6 12.0 - 15.0 g/dL   HCT 37.0 36.0 - 46.0 %  SARS Coronavirus 2 (CEPHEID - Performed in [facility] hospital lab), Hosp Order     Status: None   Collection Time: 05/29/2019 10:29 PM  Result Value Ref Range   SARS Coronavirus 2 NEGATIVE NEGATIVE    Comment: (NOTE) If result is NEGATIVE SARS-CoV-2 target nucleic acids are NOT DETECTED. The SARS-CoV-2 RNA is generally detectable in upper and lower  respiratory specimens during the acute phase of infection. The lowest  concentration of SARS-CoV-2 viral copies this assay can detect is 250  copies / mL. A negative result does not preclude SARS-CoV-2 infection  and should not be used as the sole basis for treatment or other  patient management decisions.  A negative result may occur with  improper specimen collection / handling, submission of specimen other  than nasopharyngeal swab, presence of viral mutation(s) within the  areas targeted by this assay, and inadequate number of viral copies  (<250 copies / mL). A negative result must be combined with clinical  observations, patient history, and epidemiological information. If result is POSITIVE SARS-CoV-2 target nucleic acids are DETECTED. The SARS-CoV-2 RNA is generally detectable in upper and lower  respiratory specimens dur ing the acute phase of infection.  Positive  results are indicative of active infection with SARS-CoV-2.  Clinical  correlation with patient history and other diagnostic information is  necessary to determine patient infection status.  Positive results do  not rule out bacterial infection or co-infection with other viruses. If result is PRESUMPTIVE POSTIVE SARS-CoV-2 nucleic acids MAY BE PRESENT.   A presumptive positive result was obtained on the submitted specimen  and confirmed on repeat testing.  While 2019 novel coronavirus  (SARS-CoV-2) nucleic acids may be present in the submitted sample  additional confirmatory testing may be necessary for epidemiological   and / or clinical management purposes  to differentiate between  SARS-CoV-2 and other Sarbecovirus currently known to infect humans.  If clinically indicated additional testing with an alternate test  methodology [phone]) is advised. The SARS-CoV-2 RNA is generally  detectable in upper and lower respiratory sp ecimens during the acute  phase of infection. The expected result is Negative. Fact Sheet for Patients:  StrictlyIdeas.no Fact Sheet for Healthcare Providers: BankingDealers.co.za This test is not yet approved or cleared by the [place] FDA and has been authorized for detection and/or diagnosis of SARS-CoV-2 by FDA under an Emergency Use Authorization (EUA).  This EUA will remain in effect (meaning this test can be used) for the duration of the COVID-19 declaration under Section 564(b)(1) of the Act, 21 U.S.C. section 360bbb-3(b)(1), unless the authorization is terminated or revoked sooner. Performed at [facility], [place] [address], [place], [place] [address]   Prepare RBC     Status: None   Collection Time: 05/23/2019 10:30 PM  Result Value Ref Range   Order Confirmation      ORDER PROCESSED BY BLOOD BANK Performed at [facility], [place] [address] [place], [place] [address]    Dg Chest Port 1 View  Result Date: 06/06/2019 CLINICAL DATA:  Shortness of breath EXAM: PORTABLE CHEST 1 VIEW COMPARISON:  12/13/2017 FINDINGS: Evaluation is limited by patient positioning. The cardiac silhouette is enlarged. There is a left basilar airspace opacity. No pneumothorax. An old displaced right clavicle fracture is noted. There are advanced degenerative changes of the right glenohumeral joint. A background of emphysematous changes is suspected. IMPRESSION: 1. Cardiomegaly. 2. Persistent but improved opacity at the left lung base favored to represent chronic atelectasis or scarring. There may be a small underlying pleural effusion.  Electronically Signed   By: Constance Holster M.D.   On: 05/27/2019 22:32    Pending Labs Unresulted Labs (From admission, onward)    Start     Ordered   05/14/19 9323  Basic metabolic panel  Tomorrow morning,   R     05/14/19 0001   05/14/19 0500  CBC  Tomorrow morning,   R     05/14/19 0001          Vitals/Pain Today's Vitals   05/12/2019 2300 05/11/2019 2305 05/26/2019 2310 05/22/2019 2315  BP: 102/89 (!) 106/94 97/76 105/67  Pulse:  (!) 116 (!) 121 (!) 112  Resp: (!) 24 19 19 17   SpO2:  100% 94%   PainSc:        Isolation Precautions No active isolations  Medications Medications  0.9 %  sodium chloride infusion (has no administration in time range)  acetaminophen (TYLENOL) tablet 650 mg (has no administration in time range)    Or  acetaminophen (TYLENOL) suppository 650 mg (has no administration in time range)  ondansetron (ZOFRAN) tablet 4 mg (has no administration in time range)    Or  ondansetron (ZOFRAN) injection 4 mg (has no administration in time range)  0.9 %  sodium chloride infusion (has no administration in time range)  pantoprazole (PROTONIX) 80 mg in sodium chloride 0.9 % 250 mL (0.32 mg/mL) infusion (has no administration in time range)  pantoprazole (PROTONIX) injection 40 mg (has no administration in time range)  pantoprazole (PROTONIX) injection 40 mg (has no administration in time range)  sodium chloride 0.9 % bolus 1,000 mL (0 mLs Intravenous Paused 06/02/2019 2356)  pantoprazole (PROTONIX) injection 40 mg (40 mg Intravenous Given 05/27/2019 2218)  cefTRIAXone (ROCEPHIN) 1 g in sodium chloride 0.9 % 100 mL IVPB (0 g Intravenous Stopped 05/15/2019 2259)  coag fact Xa recombinant (ANDEXXA) low dose infusion 900 mg (0 mg Intravenous Paused 05/14/2019 2356)    Mobility walks with device     Focused Assessments Cardiac Assessment Handoff:  Cardiac Rhythm: Atrial fibrillation Lab Results  Component Value Date   TROPONINI 0.04 (HH) 12/14/2017   Lab Results   Component Value Date   DDIMER 1.62 (H) 12/13/2017   Does the Patient currently have chest pain? No     R Recommendations: See Admitting Provider Note  Report given to:  Additional Notes:  Pt is CONFUSED - per daughter has dementia and sundowners

## 2019-05-14 NOTE — ED Notes (Addendum)
Pt is profoundly confused, pulled out IV and has been cursing/attempting to hit staff.  Pt's daughter at bedside.

## 2019-05-14 NOTE — Progress Notes (Signed)
Pt with daughter at bedside.  Pt is angry doesn't want to be here and is a little restless.  Daughter is able to calm her down a little bit.  Called IV team to get another IV access.  IV team unable to get an IV on her.  They are unable to place Mid-Line because she is a renal patient.  She has one IV in Left forearm that is going bad she is complaining of pain in it right now.  This patient definitely needs a central line.  Also spoke with blood bank about giving one unit of blood, she said it was canceled in ER because hgb 11.8.  MD said to give if blood is ready.  Blood Bank did not have a unit ready for her.  Put that on hold for now because hgb is stable and I dont have IV access right now.  MD just called about heart rate.  She is still 130s to 150s.  Told MD she needs central line.  He said he wants to speak with critical care.  And would get back to me with orders.

## 2019-05-15 ENCOUNTER — Inpatient Hospital Stay (HOSPITAL_COMMUNITY): Payer: Medicare Other

## 2019-05-15 LAB — COMPREHENSIVE METABOLIC PANEL
ALT: 87 U/L — ABNORMAL HIGH (ref 0–44)
AST: 28 U/L (ref 15–41)
Albumin: 2.9 g/dL — ABNORMAL LOW (ref 3.5–5.0)
Alkaline Phosphatase: 74 U/L (ref 38–126)
Anion gap: 14 (ref 5–15)
BUN: 60 mg/dL — ABNORMAL HIGH (ref 8–23)
CO2: 18 mmol/L — ABNORMAL LOW (ref 22–32)
Calcium: 8.1 mg/dL — ABNORMAL LOW (ref 8.9–10.3)
Chloride: 106 mmol/L (ref 98–111)
Creatinine, Ser: 2.89 mg/dL — ABNORMAL HIGH (ref 0.44–1.00)
GFR calc Af Amer: 16 mL/min — ABNORMAL LOW (ref >60)
GFR calc non Af Amer: 14 mL/min — ABNORMAL LOW (ref >60)
Glucose, Bld: 113 mg/dL — ABNORMAL HIGH (ref 70–99)
Potassium: 2.9 mmol/L — ABNORMAL LOW (ref 3.5–5.1)
Sodium: 138 mmol/L (ref 135–145)
Total Bilirubin: 0.7 mg/dL (ref 0.3–1.2)
Total Protein: 6.3 g/dL — ABNORMAL LOW (ref 6.5–8.1)

## 2019-05-15 LAB — CBC
HCT: 33.4 % — ABNORMAL LOW (ref 36.0–46.0)
Hemoglobin: 10.7 g/dL — ABNORMAL LOW (ref 12.0–15.0)
MCH: 30.5 pg (ref 26.0–34.0)
MCHC: 32 g/dL (ref 30.0–36.0)
MCV: 95.2 fL (ref 80.0–100.0)
Platelets: 172 10*3/uL (ref 150–400)
RBC: 3.51 MIL/uL — ABNORMAL LOW (ref 3.87–5.11)
RDW: 15.8 % — ABNORMAL HIGH (ref 11.5–15.5)
WBC: 6.4 10*3/uL (ref 4.0–10.5)
nRBC: 0 % (ref 0.0–0.2)

## 2019-05-15 LAB — HCV COMMENT:

## 2019-05-15 LAB — MAGNESIUM: Magnesium: 2 mg/dL (ref 1.7–2.4)

## 2019-05-15 LAB — GLUCOSE, CAPILLARY
Glucose-Capillary: 110 mg/dL — ABNORMAL HIGH (ref 70–99)
Glucose-Capillary: 145 mg/dL — ABNORMAL HIGH (ref 70–99)
Glucose-Capillary: 150 mg/dL — ABNORMAL HIGH (ref 70–99)

## 2019-05-15 LAB — PROTIME-INR
INR: 1.7 — ABNORMAL HIGH (ref 0.8–1.2)
Prothrombin Time: 19.5 seconds — ABNORMAL HIGH (ref 11.4–15.2)

## 2019-05-15 LAB — HEPATITIS B SURFACE ANTIGEN: Hepatitis B Surface Ag: NEGATIVE

## 2019-05-15 LAB — HEPATITIS C ANTIBODY (REFLEX): HCV Ab: <0.1 s/co ratio (ref 0.0–0.9)

## 2019-05-15 MED ORDER — METOPROLOL TARTRATE 25 MG PO TABS
25.0000 mg | ORAL_TABLET | Freq: Two times a day (BID) | ORAL | Status: DC
  Administered 2019-05-15 (×2): 25 mg via ORAL
  Filled 2019-05-15 (×2): qty 1

## 2019-05-15 MED ORDER — POTASSIUM CHLORIDE CRYS ER 20 MEQ PO TBCR
40.0000 meq | EXTENDED_RELEASE_TABLET | Freq: Once | ORAL | Status: DC
  Filled 2019-05-15: qty 2

## 2019-05-15 MED ORDER — POTASSIUM CHLORIDE 10 MEQ/100ML IV SOLN
10.0000 meq | INTRAVENOUS | Status: AC
  Administered 2019-05-15 (×4): 10 meq via INTRAVENOUS
  Filled 2019-05-15 (×2): qty 100

## 2019-05-15 MED ORDER — FUROSEMIDE 10 MG/ML IJ SOLN
40.0000 mg | Freq: Once | INTRAMUSCULAR | Status: AC
  Administered 2019-05-15: 22:00:00 40 mg via INTRAVENOUS
  Filled 2019-05-15: qty 4

## 2019-05-15 MED ORDER — FUROSEMIDE 10 MG/ML IJ SOLN
40.0000 mg | Freq: Once | INTRAMUSCULAR | Status: AC
  Administered 2019-05-15: 40 mg via INTRAVENOUS
  Filled 2019-05-15: qty 4

## 2019-05-15 MED ORDER — IPRATROPIUM-ALBUTEROL 0.5-2.5 (3) MG/3ML IN SOLN: 3.0000 mL | RESPIRATORY_TRACT | Status: DC | PRN

## 2019-05-15 MED ORDER — LABETALOL HCL 5 MG/ML IV SOLN
5.0000 mg | INTRAVENOUS | Status: DC | PRN
  Administered 2019-05-15 – 2019-05-16 (×2): 5 mg via INTRAVENOUS
  Filled 2019-05-15 (×2): qty 4

## 2019-05-15 NOTE — Progress Notes (Signed)
Progress Note  Patient Name: Brittney Gutierrez Date of Encounter: 05/15/2019  Primary Cardiologist: Sanda Klein, MD    Subjective   85 year old female with a history of nonobstructive coronary artery disease, paroxysmal atrial fibrillation, chronic diastolic congestive heart failure, hypertension, TIA, significant dementia.  We are asked to see her for evaluation and management of her rapid atrial fibrillation in the setting of a GI bleed.  Inpatient Medications    Scheduled Meds: . docusate sodium  100 mg Oral BID  . [START ON 05/17/2019] pantoprazole  40 mg Intravenous Q12H  . pantoprazole (PROTONIX) IV  40 mg Intravenous Once  . sodium chloride flush  10-40 mL Intracatheter Q12H   Continuous Infusions: . amiodarone 30 mg/hr (05/14/19 2134)  . pantoprozole (PROTONIX) infusion 8 mg/hr (05/15/19 0658)  . piperacillin-tazobactam (ZOSYN)  IV 2.25 g (05/15/19 1132)  . potassium chloride 10 mEq (05/15/19 1130)   PRN Meds: acetaminophen **OR** acetaminophen, diphenhydrAMINE, ondansetron **OR** ondansetron (ZOFRAN) IV, sodium chloride flush, traMADol   Vital Signs    Vitals:   05/14/19 2042 05/15/19 0333 05/15/19 0424 05/15/19 0827  BP:  116/78  120/78  Pulse:  (!) 138    Resp:  16    Temp: 98.2 F (36.8 C) 98 F (36.7 C) 97.6 F (36.4 C) 98.5 F (36.9 C)  TempSrc: Axillary Oral Oral Oral  SpO2:  100%  96%  Weight:   60.6 kg   Height:        Intake/Output Summary (Last 24 hours) at 05/15/2019 1205 Last data filed at 05/15/2019 1132 Gross per 24 hour  Intake 2365.5 ml  Output 250 ml  Net 2115.5 ml   Last 3 Weights 05/15/2019 05/14/2019 05/14/2019  Weight (lbs) 133 lb 9.6 oz 126 lb 8.7 oz 126 lb 8.7 oz  Weight (kg) 60.6 kg 57.4 kg 57.4 kg      Telemetry    Rapid AF  - Personally Reviewed  ECG     rapid AF  - Personally Reviewed  Physical Exam   GEN: elderly female,  Demented.   Saw her with her daughter  Neck: No JVD Cardiac:  Irreg. Irreg.  Respiratory: Clear  to auscultation bilaterally. GI: Soft, nontender, non-distended  MS: No edema; No deformity. Neuro:  Nonfocal  Psych: Normal affect   Labs    Chemistry Recent Labs  Lab 05/11/2019 2202 05/26/2019 2216 05/14/19 0617 05/15/19 0433  NA 137 135 138 138  K 3.7 3.6 3.3* 2.9*  CL 101 103 105 106  CO2 18*  --  19* 18*  GLUCOSE 134* 131* 82 113*  BUN 81* 77* 75* 60*  CREATININE 3.41* 3.50* 3.27* 2.89*  CALCIUM 9.4  --  8.6* 8.1*  PROT 7.0  --   --  6.3*  ALBUMIN 3.5  --   --  2.9*  AST 33  --   --  28  ALT 129*  --   --  87*  ALKPHOS 93  --   --  74  BILITOT 1.1  --   --  0.7  GFRNONAA 11*  --  12* 14*  GFRAA 13*  --  14* 16*  ANIONGAP 18*  --  14 14     Hematology Recent Labs  Lab 05/19/2019 2202 05/12/2019 2216 05/14/19 0617 05/15/19 0433  WBC 9.2  --  7.1 6.4  RBC 3.92  --  3.48* 3.51*  HGB 11.8* 12.6 10.4* 10.7*  HCT 37.3 37.0 32.6* 33.4*  MCV 95.2  --  93.7 95.2  MCH 30.1  --  29.9 30.5  MCHC 31.6  --  31.9 32.0  RDW 15.5  --  15.4 15.8*  PLT 219  --  182 172    Cardiac EnzymesNo results for input(s): TROPONINI in the last 168 hours.  Recent Labs  Lab 05/28/2019 2215  TROPIPOC 0.04     BNPNo results for input(s): BNP, PROBNP in the last 168 hours.   DDimer No results for input(s): DDIMER in the last 168 hours.   Radiology    Dg Chest Port 1 View  Result Date: 05/14/2019 CLINICAL DATA:  Central line placement EXAM: PORTABLE CHEST 1 VIEW COMPARISON:  Yesterday FINDINGS: New right IJ line with tip at the SVC.  No pneumothorax. Cardiomegaly distorted by rightward rotation. There is a new infiltrate at the left base. Remote nonunited right mid clavicle fracture. IMPRESSION: 1. New central line without complicating feature. 2. Probable left lower lobe pneumonia or aspiration. Electronically Signed   By: Monte Fantasia M.D.   On: 05/14/2019 06:45   Dg Chest Port 1 View  Result Date: 05/14/2019 CLINICAL DATA:  Shortness of breath EXAM: PORTABLE CHEST 1 VIEW COMPARISON:   12/13/2017 FINDINGS: Evaluation is limited by patient positioning. The cardiac silhouette is enlarged. There is a left basilar airspace opacity. No pneumothorax. An old displaced right clavicle fracture is noted. There are advanced degenerative changes of the right glenohumeral joint. A background of emphysematous changes is suspected. IMPRESSION: 1. Cardiomegaly. 2. Persistent but improved opacity at the left lung base favored to represent chronic atelectasis or scarring. There may be a small underlying pleural effusion. Electronically Signed   By: Constance Holster M.D.   On: 06/07/2019 22:32    Cardiac Studies      Patient Profile     85 y.o. female with hx of PAF.   Was on eliquis and had a GI bleed.   Assessment & Plan    1.   AFwith RVR.   On amio.  Will restart metoprolol now that BP is better  I doubt that we will be able to safely restart her eliquis due to this GI bleed.  Dr. Ethlyn Daniels has determined that she is not a good candidate for endoscopy    2.  HTN:   Continue current meds .  3.  CAD :  Non obstructive.  No angina that she has told us        For questions or updates, please contact [place] Please consult [email or web address] for contact info under        Signed, Mertie Moores, MD  05/15/2019, 12:05 PM

## 2019-05-15 NOTE — Progress Notes (Signed)
Subjective: No reported dark stools per nursing staff. No reported abdominal pain or other issues after my discussion with patient's daughter who is at bedside.  Objective: Vital signs in last 24 hours: Temp:  [97.6 F (36.4 C)-98.5 F (36.9 C)] 98.5 F (36.9 C) (06/06 0827) Pulse Rate:  [53-203] 138 (06/06 0333) Resp:  [14-27] 16 (06/06 0333) BP: (89-120)/(58-92) 120/78 (06/06 0827) SpO2:  [91 %-100 %] 96 % (06/06 0827) Weight:  [60.6 kg] 60.6 kg (06/06 0424) Weight change: 3.2 kg Last BM Date: 06/03/2019  PE: GEN:  Alert, demented NEURO:  Not oriented, confused (chronic) CV:  Irregularly irregular, tachycardic (140s) ABD:  Soft, no peritonitis  Lab Results: CBC    Component Value Date/Time   WBC 6.4 05/15/2019 0433   RBC 3.51 (L) 05/15/2019 0433   HGB 10.7 (L) 05/15/2019 0433   HGB 12.2 01/16/2016 1058   HCT 33.4 (L) 05/15/2019 0433   HCT 35.9 01/16/2016 1058   PLT 172 05/15/2019 0433   PLT 266 01/16/2016 1058   MCV 95.2 05/15/2019 0433   MCV 87 01/16/2016 1058   MCH 30.5 05/15/2019 0433   MCHC 32.0 05/15/2019 0433   RDW 15.8 (H) 05/15/2019 0433   RDW 14.2 01/16/2016 1058   LYMPHSABS 0.7 05/26/2019 2202   LYMPHSABS 1.0 01/16/2016 1058   MONOABS 0.7 05/14/2019 2202   EOSABS 0.0 05/26/2019 2202   EOSABS 0.2 01/16/2016 1058   BASOSABS 0.0 05/28/2019 2202   BASOSABS 0.0 01/16/2016 1058   CMP     Component Value Date/Time   NA 138 05/15/2019 0433   NA 134 07/23/2017 1011   K 2.9 (L) 05/15/2019 0433   CL 106 05/15/2019 0433   CO2 18 (L) 05/15/2019 0433   GLUCOSE 113 (H) 05/15/2019 0433   BUN 60 (H) 05/15/2019 0433   BUN 23 07/23/2017 1011   CREATININE 2.89 (H) 05/15/2019 0433   CREATININE 1.55 (H) 11/26/2018 1144   CALCIUM 8.1 (L) 05/15/2019 0433   PROT 6.3 (L) 05/15/2019 0433   PROT 7.1 01/16/2016 1058   ALBUMIN 2.9 (L) 05/15/2019 0433   ALBUMIN 3.9 01/16/2016 1058   AST 28 05/15/2019 0433   ALT 87 (H) 05/15/2019 0433   ALKPHOS 74 05/15/2019 0433   BILITOT 0.7 05/15/2019 0433   BILITOT <0.2 01/16/2016 1058   GFRNONAA 14 (L) 05/15/2019 0433   GFRNONAA 29 (L) 11/26/2018 1144   GFRAA 16 (L) 05/15/2019 0433   GFRAA 34 (L) 11/26/2018 1144   Assessment:  1.  Question of dark stools.  No dark stools since she was admitted. 2.  Anemia, mild, hemoccult-positive. 3.  Chronic anticoagulation, apixaban. 4.  Atrial fibrillation, rapid at present. 5.  Elevated ALT, mild, downtrending.  Plan:  1.  Patient is suboptimal endoscopy candidate:  Based on the particulars of this patient's case, and based on my discussion with patient's daughter, I would not pursue endoscopic evaluation unless/until patient develops evidence of destabilizing bleeding (which is not currently the case).  2.  Would pursue PPI indefinitely (pantoprazole 40 mg po qd or the equivalent). 3.  Awaiting rest of acute hepatitis panel; follow liver enzymes. 4.  [facility] will follow.   Landry Dyke 05/15/2019, 10:43 AM   Cell [phone] If no answer or after 5 PM call [phone]

## 2019-05-15 NOTE — Progress Notes (Signed)
CCMD notified staff that patient had 7 beats run of V-tach; patient resting in bed, asymptomatic. MD made aware. Will continue to monitor.

## 2019-05-15 NOTE — Progress Notes (Signed)
Patient refused to take medicine by mouth. Also she refused breakfast because is not "real food". MD made aware. Will continue to monitor.

## 2019-05-15 NOTE — Progress Notes (Signed)
PROGRESS NOTE    Brittney Gutierrez  SWH:[id number] DOB: 03/15/1927 DOA: 05/12/2019 PCP: Gayland Curry, DO    Brief Narrative:  85 y.o. female with history of paroxysmal atrial fibrillation, diastolic CHF, hypertension, dementia was found to have had multiple episodes of loose bowel stools melanotic in appearance yesterday at her living facility.  Patient also was complaining of lower abdominal discomfort.  Patient's heart rate was found to be elevated.  Per daughter patient has not been doing well last 2 weeks with poor appetite and had to have some person sit with her to eat.  Despite which patient still was not eating well.  Patient had not complained of any chest pain or shortness of breath.  Patient is on Eliquis for A. fib.  ED Course: In the ER patient is found to be in A. fib with RVR blood pressure with pain to low normal.  Labs revealed creatinine of 3.4 which increased from 1.46 months ago.  Hemoglobin was 11.8 and patient does have chronic anemia hemoglobin stands around 10.  Stool for occult blood was positive.  ER physician discussed with on-call gastroenterologist who will be seeing patient in consult.  Patient started on Protonix.  IV fluid bolus for acute renal failure with low normal blood pressure and also received patient heart rate improved with it.  Assessment & Plan:   Principal Problem:   Acute GI bleeding Active Problems:   Dementia (HCC)   SSS (sick sinus syndrome) (HCC)   Atrial fibrillation with RVR (HCC)   ARF (acute renal failure) ([place])  1. Acute GI bleeding with acute blood loss anemia 1. Report of dark colored stools noted 2. Remains hemodynamically stable at this time 3. Eliquis currently on hold, see below 4. Per GI, would not pursue endoscopy unless evidence of destabilizing bleeding. Also recommendation for indefinite PPI 2. A. fib with RVR 1. Continued on amiodarone gtt 2. Eliquis on hold given concerns of acute blood loss anemia 3. [place]  [facility] input. Beta blocker started 4. Have ordered PRN labetalol IV for HR>120 3. Acute renal failure likely from poor oral intake and GI bleed.   1. Continue fluids as tolerated 2. Recheck bmet in AM 4. Abdominal pain 1. Per daughter patient was complaining of abdominal pain day prior to admit 2. On further questioning, pain seems to be more lower quadrant. Pt has hx of both diarrhea as well as constipation, also noted to strain to move bowels in past 3. Cont with cathartics as needed 5. History of CHF with preserved EF 1. Clinically euvolemic at present 2. Tolerated gentle IVF 6. Dementia. 1. Seems stable at present 2. Family is at bedside 7. Hypertension holding antihypertensives. 1. BP currently stable 8. Acute blood loss anemia 1. Recheck CBC in AM  DVT prophylaxis: SCD's Code Status: DNR Family Communication: Pt in room, family at bedside Disposition Plan: Uncertain at this time  Consultants:   GI  Cardiology  Procedures:     Antimicrobials: Anti-infectives (From admission, onward)   Start     Dose/Rate Route Frequency Ordered Stop   05/14/19 0130  piperacillin-tazobactam (ZOSYN) IVPB 2.25 g     2.25 g 100 mL/hr over 30 Minutes Intravenous Every 8 hours 05/14/19 0124     05/27/2019 2215  cefTRIAXone (ROCEPHIN) 1 g in sodium chloride 0.9 % 100 mL IVPB     1 g 200 mL/hr over 30 Minutes Intravenous  Once 05/17/2019 2202 05/27/2019 2259      Subjective: Confused and angry this AM,  wanting food  Objective: Vitals:   05/15/19 0827 05/15/19 1158 05/15/19 1308 05/15/19 1532  BP: 120/78 120/78    Pulse:  (!) 124 97 80  Resp:    (!) 25  Temp: 98.5 F (36.9 C) 97.9 F (36.6 C)    TempSrc: Oral Axillary    SpO2: 96% 97% 91% (!) 88%  Weight:      Height:        Intake/Output Summary (Last 24 hours) at 05/15/2019 1601 Last data filed at 05/15/2019 1353 Gross per 24 hour  Intake 2625.5 ml  Output 250 ml  Net 2375.5 ml   Filed Weights   05/14/19 0030 05/14/19  0519 05/15/19 0424  Weight: 57.4 kg 57.4 kg 60.6 kg    Examination: General exam: Awake, laying in bed, in nad Respiratory system: Normal respiratory effort, no wheezing Cardiovascular system: tachycardic, s1, s2 Gastrointestinal system: Soft, nondistended, positive BS Central nervous system: CN2-12 grossly intact, strength intact Extremities: Perfused, no clubbing Skin: Normal skin turgor, no notable skin lesions seen Psychiatry: Mood normal // no visual hallucinations   Data Reviewed: I have personally reviewed following labs and imaging studies  CBC: Recent Labs  Lab 05/21/2019 2202 05/26/2019 2216 05/14/19 0617 05/15/19 0433  WBC 9.2  --  7.1 6.4  NEUTROABS 7.7  --   --   --   HGB 11.8* 12.6 10.4* 10.7*  HCT 37.3 37.0 32.6* 33.4*  MCV 95.2  --  93.7 95.2  PLT 219  --  182 371   Basic Metabolic Panel: Recent Labs  Lab 05/31/2019 2202 06/06/2019 2216 05/14/19 0617 05/15/19 0433  NA 137 135 138 138  K 3.7 3.6 3.3* 2.9*  CL 101 103 105 106  CO2 18*  --  19* 18*  GLUCOSE 134* 131* 82 113*  BUN 81* 77* 75* 60*  CREATININE 3.41* 3.50* 3.27* 2.89*  CALCIUM 9.4  --  8.6* 8.1*  MG  --   --   --  2.0   GFR: Estimated Creatinine Clearance: 11 mL/min (A) (by C-G formula based on SCr of 2.89 mg/dL (H)). Liver Function Tests: Recent Labs  Lab 06/03/2019 2202 05/15/19 0433  AST 33 28  ALT 129* 87*  ALKPHOS 93 74  BILITOT 1.1 0.7  PROT 7.0 6.3*  ALBUMIN 3.5 2.9*   Recent Labs  Lab 05/22/2019 2202  LIPASE 43   No results for input(s): AMMONIA in the last 168 hours. Coagulation Profile: Recent Labs  Lab 05/29/2019 2209 05/15/19 0433  INR 1.9* 1.7*   Cardiac Enzymes: No results for input(s): CKTOTAL, CKMB, CKMBINDEX, TROPONINI in the last 168 hours. BNP (last 3 results) No results for input(s): PROBNP in the last 8760 hours. HbA1C: No results for input(s): HGBA1C in the last 72 hours. CBG: Recent Labs  Lab 05/14/19 1205 05/14/19 1653 05/15/19 0828 05/15/19 1206  05/15/19 1547  GLUCAP 97 121* 110* 145* 150*   Lipid Profile: No results for input(s): CHOL, HDL, LDLCALC, TRIG, CHOLHDL, LDLDIRECT in the last 72 hours. Thyroid Function Tests: No results for input(s): TSH, T4TOTAL, FREET4, T3FREE, THYROIDAB in the last 72 hours. Anemia Panel: No results for input(s): VITAMINB12, FOLATE, FERRITIN, TIBC, IRON, RETICCTPCT in the last 72 hours. Sepsis Labs: Recent Labs  Lab 05/22/2019 2202  LATICACIDVEN 2.5*    Recent Results (from the past 240 hour(s))  SARS Coronavirus 2 (CEPHEID - Performed in [facility] hospital lab), Hosp Order     Status: None   Collection Time: 06/07/2019 10:29 PM  Result Value  Ref Range Status   SARS Coronavirus 2 NEGATIVE NEGATIVE Final    Comment: (NOTE) If result is NEGATIVE SARS-CoV-2 target nucleic acids are NOT DETECTED. The SARS-CoV-2 RNA is generally detectable in upper and lower  respiratory specimens during the acute phase of infection. The lowest  concentration of SARS-CoV-2 viral copies this assay can detect is 250  copies / mL. A negative result does not preclude SARS-CoV-2 infection  and should not be used as the sole basis for treatment or other  patient management decisions.  A negative result may occur with  improper specimen collection / handling, submission of specimen other  than nasopharyngeal swab, presence of viral mutation(s) within the  areas targeted by this assay, and inadequate number of viral copies  (<250 copies / mL). A negative result must be combined with clinical  observations, patient history, and epidemiological information. If result is POSITIVE SARS-CoV-2 target nucleic acids are DETECTED. The SARS-CoV-2 RNA is generally detectable in upper and lower  respiratory specimens dur ing the acute phase of infection.  Positive  results are indicative of active infection with SARS-CoV-2.  Clinical  correlation with patient history and other diagnostic information is  necessary to determine  patient infection status.  Positive results do  not rule out bacterial infection or co-infection with other viruses. If result is PRESUMPTIVE POSTIVE SARS-CoV-2 nucleic acids MAY BE PRESENT.   A presumptive positive result was obtained on the submitted specimen  and confirmed on repeat testing.  While 2019 novel coronavirus  (SARS-CoV-2) nucleic acids may be present in the submitted sample  additional confirmatory testing may be necessary for epidemiological  and / or clinical management purposes  to differentiate between  SARS-CoV-2 and other Sarbecovirus currently known to infect humans.  If clinically indicated additional testing with an alternate test  methodology [phone]) is advised. The SARS-CoV-2 RNA is generally  detectable in upper and lower respiratory sp ecimens during the acute  phase of infection. The expected result is Negative. Fact Sheet for Patients:  StrictlyIdeas.no Fact Sheet for Healthcare Providers: BankingDealers.co.za This test is not yet approved or cleared by the [place] FDA and has been authorized for detection and/or diagnosis of SARS-CoV-2 by FDA under an Emergency Use Authorization (EUA).  This EUA will remain in effect (meaning this test can be used) for the duration of the COVID-19 declaration under Section 564(b)(1) of the Act, 21 U.S.C. section 360bbb-3(b)(1), unless the authorization is terminated or revoked sooner. Performed at [facility], [place] [address] [place], [place] [address]      Radiology Studies: Dg Chest Port 1 View  Result Date: 05/14/2019 CLINICAL DATA:  Central line placement EXAM: PORTABLE CHEST 1 VIEW COMPARISON:  Yesterday FINDINGS: New right IJ line with tip at the SVC.  No pneumothorax. Cardiomegaly distorted by rightward rotation. There is a new infiltrate at the left base. Remote nonunited right mid clavicle fracture. IMPRESSION: 1. New central line without  complicating feature. 2. Probable left lower lobe pneumonia or aspiration. Electronically Signed   By: Monte Fantasia M.D.   On: 05/14/2019 06:45   Dg Chest Port 1 View  Result Date: 05/25/2019 CLINICAL DATA:  Shortness of breath EXAM: PORTABLE CHEST 1 VIEW COMPARISON:  12/13/2017 FINDINGS: Evaluation is limited by patient positioning. The cardiac silhouette is enlarged. There is a left basilar airspace opacity. No pneumothorax. An old displaced right clavicle fracture is noted. There are advanced degenerative changes of the right glenohumeral joint. A background of emphysematous changes is suspected.  IMPRESSION: 1. Cardiomegaly. 2. Persistent but improved opacity at the left lung base favored to represent chronic atelectasis or scarring. There may be a small underlying pleural effusion. Electronically Signed   By: Constance Holster M.D.   On: 05/25/2019 22:32    Scheduled Meds: . docusate sodium  100 mg Oral BID  . metoprolol tartrate  25 mg Oral BID  . [START ON 05/17/2019] pantoprazole  40 mg Intravenous Q12H  . pantoprazole (PROTONIX) IV  40 mg Intravenous Once  . sodium chloride flush  10-40 mL Intracatheter Q12H   Continuous Infusions: . amiodarone 30 mg/hr (05/14/19 2134)  . pantoprozole (PROTONIX) infusion 8 mg/hr (05/15/19 0658)  . piperacillin-tazobactam (ZOSYN)  IV 2.25 g (05/15/19 1132)     LOS: 2 days   Marylu Lund, MD [facility] Pager On Amion  If 7PM-7AM, please contact night-coverage 05/15/2019, 4:01 PM

## 2019-05-15 NOTE — Progress Notes (Signed)
MD was notified about patient having another 14 beats of V-tach.

## 2019-05-15 NOTE — Progress Notes (Signed)
Patient's HR still high in 140s, with low BP 99/79, unable to administer Labetalol. Also, sat O2 is in low 70s patient being on 3 L oxygen [place]. MD made aware. Will continue to monitor.

## 2019-05-15 NOTE — Progress Notes (Signed)
Pt. HR still in 120's and 130's.  She is on Amiodarone Drip at 30mg /hr.  A few times HR went into 140s-150's.  Called on call.  Gave a 150mg  bolus of Amiodarone IV.  HR still high.  On call did not want to give anything else because patient also had a few BP drops.  Pt not in any distress.  Will continue to monitor.

## 2019-05-15 NOTE — Progress Notes (Signed)
BP 110/96 (102) and HR 131. Patient received Labetalol 5 mg IV per PRN order. Will continue to monitor.

## 2019-05-16 ENCOUNTER — Inpatient Hospital Stay (HOSPITAL_COMMUNITY): Payer: Medicare Other

## 2019-05-16 DIAGNOSIS — R0602 Shortness of breath: Secondary | ICD-10-CM

## 2019-05-16 LAB — BLOOD GAS, ARTERIAL
Acid-base deficit: 12.4 mmol/L — ABNORMAL HIGH (ref 0.0–2.0)
Bicarbonate: 12.6 mmol/L — ABNORMAL LOW (ref 20.0–28.0)
Delivery systems: POSITIVE
Drawn by: 55062
Expiratory PAP: 6
FIO2: 100
Inspiratory PAP: 16
Mode: POSITIVE
O2 Saturation: 97.3 %
Patient temperature: 98.6
pCO2 arterial: 25.6 mmHg — ABNORMAL LOW (ref 32.0–48.0)
pH, Arterial: 7.314 — ABNORMAL LOW (ref 7.350–7.450)
pO2, Arterial: 108 mmHg (ref 83.0–108.0)

## 2019-05-16 LAB — BASIC METABOLIC PANEL
Anion gap: 16 — ABNORMAL HIGH (ref 5–15)
BUN: 54 mg/dL — ABNORMAL HIGH (ref 8–23)
CO2: 14 mmol/L — ABNORMAL LOW (ref 22–32)
Calcium: 8.3 mg/dL — ABNORMAL LOW (ref 8.9–10.3)
Chloride: 106 mmol/L (ref 98–111)
Creatinine, Ser: 3.1 mg/dL — ABNORMAL HIGH (ref 0.44–1.00)
GFR calc Af Amer: 15 mL/min — ABNORMAL LOW (ref >60)
GFR calc non Af Amer: 13 mL/min — ABNORMAL LOW (ref >60)
Glucose, Bld: 134 mg/dL — ABNORMAL HIGH (ref 70–99)
Potassium: 4.7 mmol/L (ref 3.5–5.1)
Sodium: 136 mmol/L (ref 135–145)

## 2019-05-16 LAB — CBC
HCT: 35.4 % — ABNORMAL LOW (ref 36.0–46.0)
Hemoglobin: 11.1 g/dL — ABNORMAL LOW (ref 12.0–15.0)
MCH: 30.2 pg (ref 26.0–34.0)
MCHC: 31.4 g/dL (ref 30.0–36.0)
MCV: 96.5 fL (ref 80.0–100.0)
Platelets: 168 10*3/uL (ref 150–400)
RBC: 3.67 MIL/uL — ABNORMAL LOW (ref 3.87–5.11)
RDW: 15.6 % — ABNORMAL HIGH (ref 11.5–15.5)
WBC: 8.6 10*3/uL (ref 4.0–10.5)
nRBC: 0 % (ref 0.0–0.2)

## 2019-05-16 LAB — GLUCOSE, CAPILLARY: Glucose-Capillary: 87 mg/dL (ref 70–99)

## 2019-05-16 MED ORDER — MORPHINE SULFATE (PF) 2 MG/ML IV SOLN
INTRAVENOUS | Status: AC
  Administered 2019-05-16: 11:00:00 2 mg via INTRAVENOUS
  Filled 2019-05-16: qty 1

## 2019-05-16 MED ORDER — SODIUM CHLORIDE 0.9 % IV BOLUS
500.0000 mL | Freq: Once | INTRAVENOUS | Status: AC
  Administered 2019-05-16: 500 mL via INTRAVENOUS

## 2019-05-16 MED ORDER — SODIUM CHLORIDE 0.9 % IV BOLUS
250.0000 mL | Freq: Once | INTRAVENOUS | Status: AC
  Administered 2019-05-16: 250 mL via INTRAVENOUS

## 2019-05-16 MED ORDER — MORPHINE SULFATE (PF) 2 MG/ML IV SOLN
2.0000 mg | Freq: Once | INTRAVENOUS | Status: AC
  Administered 2019-05-16: 2 mg via INTRAVENOUS
  Filled 2019-05-16: qty 1

## 2019-05-16 MED ORDER — MORPHINE SULFATE (PF) 2 MG/ML IV SOLN
1.0000 mg | Freq: Once | INTRAVENOUS | Status: AC
  Administered 2019-05-16: 1 mg via INTRAVENOUS
  Filled 2019-05-16: qty 1

## 2019-05-16 MED ORDER — MORPHINE SULFATE (PF) 2 MG/ML IV SOLN: 1.0000 mg | INTRAVENOUS | Status: DC | PRN

## 2019-05-31 ENCOUNTER — Ambulatory Visit: Payer: Self-pay

## 2019-06-02 ENCOUNTER — Ambulatory Visit: Payer: Self-pay

## 2019-06-09 NOTE — Progress Notes (Signed)
Subjective: Daughter reports her mother had rough night:  Agitation and respiratory troubles. No reported blood in stools or hematemesis.  Objective: Vital signs in last 24 hours: Temp:  [97.6 F (36.4 C)-98.3 F (36.8 C)] 98.3 F (36.8 C) (06/07 0023) Pulse Rate:  [48-138] 86 (06/07 0815) Resp:  [16-35] 17 (06/07 1026) BP: (58-111)/(29-97) 58/29 (06/07 1026) SpO2:  [62 %-92 %] 75 % (06/07 0815) FiO2 (%):  [100 %] 100 % (06/07 0139) Weight change:  Last BM Date: 05/14/19  PE: GEN:  Confused, on BiPap  Lab Results: CBC    Component Value Date/Time   WBC 8.6 June 03, 2019 0348   RBC 3.67 (L) June 03, 2019 0348   HGB 11.1 (L) 06/03/2019 0348   HGB 12.2 01/16/2016 1058   HCT 35.4 (L) Jun 03, 2019 0348   HCT 35.9 01/16/2016 1058   PLT 168 Jun 03, 2019 0348   PLT 266 01/16/2016 1058   MCV 96.5 Jun 03, 2019 0348   MCV 87 01/16/2016 1058   MCH 30.2 06/03/19 0348   MCHC 31.4 2019/06/03 0348   RDW 15.6 (H) 03-Jun-2019 0348   RDW 14.2 01/16/2016 1058   LYMPHSABS 0.7 05/17/2019 2202   LYMPHSABS 1.0 01/16/2016 1058   MONOABS 0.7 05/18/2019 2202   EOSABS 0.0 05/19/2019 2202   EOSABS 0.2 01/16/2016 1058   BASOSABS 0.0 05/10/2019 2202   BASOSABS 0.0 01/16/2016 1058   CMP     Component Value Date/Time   NA 136 2019/06/03 0348   NA 134 07/23/2017 1011   K 4.7 03-Jun-2019 0348   CL 106 03-Jun-2019 0348   CO2 14 (L) 06-03-19 0348   GLUCOSE 134 (H) Jun 03, 2019 0348   BUN 54 (H) June 03, 2019 0348   BUN 23 07/23/2017 1011   CREATININE 3.10 (H) 06-03-2019 0348   CREATININE 1.55 (H) 11/26/2018 1144   CALCIUM 8.3 (L) 06/03/19 0348   PROT 6.3 (L) 05/15/2019 0433   PROT 7.1 01/16/2016 1058   ALBUMIN 2.9 (L) 05/15/2019 0433   ALBUMIN 3.9 01/16/2016 1058   AST 28 05/15/2019 0433   ALT 87 (H) 05/15/2019 0433   ALKPHOS 74 05/15/2019 0433   BILITOT 0.7 05/15/2019 0433   BILITOT <0.2 01/16/2016 1058   GFRNONAA 13 (L) 06-03-2019 0348   GFRNONAA 29 (L) 11/26/2018 1144   GFRAA 15 (L)  2019-06-03 0348   GFRAA 34 (L) 11/26/2018 1144    Assessment:  1.  Question of dark stools.  No dark stools since she was admitted. 2.  Anemia, mild, hemoccult-positive. 3.  Chronic anticoagulation, apixaban. 4.  Atrial fibrillation, rapid at present. 5.  Elevated ALT, mild, downtrending.  Plan:  1.  Patient is in no shape for endoscopy. 2.  Continue PPI. 3.  Management of rapid Afib and respiratory issues per cardiology and hospitalists. 4.  [facility] will sign-off.   Landry Dyke 06-03-2019, 12:31 PM   Cell [phone] If no answer or after 5 PM call [phone]

## 2019-06-09 NOTE — Progress Notes (Signed)
Bipap removed per family request.

## 2019-06-09 NOTE — Progress Notes (Signed)
pts AM BP 56/38 - Left upper arm- cuff adjusted and BP slightly improved to 70/51 - Attending md made aware, 250 cc bolus initiated - 15 min BP check 79/43.

## 2019-06-09 NOTE — Progress Notes (Signed)
Pt asystole at 1124 - verified by 2 RNs, family at bedside, dr Wyline Copas made aware.

## 2019-06-09 NOTE — Progress Notes (Signed)
Called by RN due to patient oxygen desaturation to 61%. MD ordered ABG and BIPAP, patient placed on BIPAP and ABG done, RN called with results. Will continue to monitor patient.

## 2019-06-09 NOTE — Progress Notes (Signed)
Patient continued to desat on 5 L of O2. Saturation was 61%. Called the provider and received orders for X-ray,BIPAP, and ABG. The patient also has not put out urine after multiple doses of lasix. Provider ordered a bladder scan. Scan showed  greater than 585 ml. We did an in/out cath and got back 800 ml of urine. BIPAP was initiated and X-ray obtained. ABG resulted and I was notified by the RRT about results. The patient was given morphine as ordered and is resting. Will continue to monitor patient status.  Vitals: RR - 23 HR - 117 O2 91 % on BIPAP BP - 86/75

## 2019-06-09 NOTE — Progress Notes (Signed)
Pt with increasing WOB on bipap, extremities cool to touch, BP still low. MD made aware. Waiting on family to transition to comfort care. PRN dose of morphine given for patient comfort.

## 2019-06-09 NOTE — Plan of Care (Signed)
Patient seen earlier this morning (when she was alive) and I didn't compose my progress note until about 1230 pm (at which time she had expired).  Please be aware that my progress note entry at 12:33 pm reflects my findings and recommendations at time I saw patient, which was around 930 am.  Thank you  --Wonda Horner, MD-- [facility]

## 2019-06-09 NOTE — Progress Notes (Signed)
Patient removed the mask and refused the BIPAP. RN placed patient on 10L HFNC. Will continue to monitor patient.

## 2019-06-09 NOTE — Progress Notes (Signed)
This nurse poke with Bodenheimer, NP in regards to patient's inconsistent oxygen saturations versus ABG.  Patient also pulled off her Bipap.  HFNC was then used to replace device.  BP low in 70s/50s.  A 500 cc bolus was ordered and initiated.  Will report this conversation to patient's primary nurse.

## 2019-06-09 NOTE — Discharge Summary (Signed)
Death Summary  Brittney Gutierrez NLG:[id number] DOB: 05/23/1927 DOA: 2019-06-03  PCP: Gayland Curry, DO  Admit date: June 03, 2019 Date of Death: 06-06-19 Time of Death: 03/24/23 Notification: Gayland Curry, DO notified of death of June 06, 2019   History of present illness:  85 y.o.femalewithhistory of paroxysmal atrial fibrillation, diastolic CHF, hypertension, dementia was found to have had multiple episodes of loose bowel stools melanotic in appearance at her living facility. Patient also was complaining of lower abdominal discomfort. Patient's heart rate was found to be elevated. Per daughter patient has not been doing well last 2 weeks with poor appetite and had to have some person sit with her to eat. Despite which patient still was not eating well. Patient had not complained of any chest pain or shortness of breath. Patient was on Eliquis for A. Fib. Gastroenterology was consulted and patient admitted to the floor  Final Diagnoses:  1. Acute GI bleedingwith acute blood loss anemia 2. A. fib with RVR 3. Acute renal failure likely from poor oral intake and GI bleed.  4. Abdominal pain 5. Acute on chronic CHF with preserved EF 6. Dementia. 7. Hypertension 8. Acute blood loss anemia 9. Acute hypoxemic respiratory failure 10. Acute renal failure  Patient was admitted to the medical floor where she was noted to be in rapid afib that was difficult to control, complicated by hypotension. Patient was continued on amiodarone gtt and Cardiology consulted. Patient was seen by GI. Given frailty of patient's condition, patient was not a candidate for endoscopy. Hemoglobin had remained stable with eliquis remaining on hold. Unfortunately, patient's heart rate continued to be difficult to control despite addition of beta blocker by Cardiology. Patient became increasingly hypoxemic and STAT CXR was obtained with findings of atelectasis vs consolidation and moderate L pleural effusion. Patient was  given empiric lasix and continued on broad antibiotic which was started empirically on admit. Despite aggressive measures, patient continued to deteriorate and O2 requirements worsened. Patient later required bipap support and became increasingly hypotensive which was somewhat fluid responsive. Renal function worsened as well with Cr in excess of 3. Family was updated at bedside and decision was made for transition to full comfort. Patient was started on morphine for comfort and bipap discontinued. Patient was pronounced shortly thereafter at 1124. Family members were present at bedside at time of death.  The results of significant diagnostics from this hospitalization (including imaging, microbiology, ancillary and laboratory) are listed below for reference.    Significant Diagnostic Studies: Dg Chest Port 1 View  Result Date: 06-06-2019 CLINICAL DATA:  85 year old female with history of shortness of breath. EXAM: PORTABLE CHEST 1 VIEW COMPARISON:  Chest x-ray 05/15/2019. FINDINGS: There is a right-sided internal jugular central venous catheter with tip terminating in the mid superior vena cava. Moderate left pleural effusion. Atelectasis and/or consolidation in the left mid to lower lung. Right lung is poorly visualized secondary to patient rotation the right, but appears generally well aerated. No evidence of pulmonary edema. Heart size is mildly enlarged. Upper mediastinal contours are grossly distorted. Aortic atherosclerosis. 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 IMPRESSION: 1. Support apparatus, as above. 2. Atelectasis and/or consolidation in the left mid to lower lung with moderate left pleural effusion. 3. Aortic atherosclerosis. 4. Mild cardiomegaly. Electronically Signed   By: Vinnie Langton M.D.   On: 2019-06-06 01:40   Dg Chest Port 1 View  Result Date: 05/15/2019 CLINICAL DATA:  Hypoxia today.  Dementia. EXAM: PORTABLE CHEST 1 VIEW COMPARISON:  05/14/2019  and 06/05/2019  FINDINGS: Right IJ central venous catheter unchanged. Lungs are adequately inflated and demonstrate mild hazy opacification in the perihilar regions and bibasilar regions likely mild interstitial edema which is slightly worse. Possible small amount left pleural fluid. Mild stable cardiomegaly. Remainder the exam is unchanged. IMPRESSION: Slight worsening hazy opacification over the perihilar and bibasilar regions likely mild interstitial edema. Possible small left effusion. Mild stable cardiomegaly. Right IJ central venous catheter unchanged. Electronically Signed   By: Marin Olp M.D.   On: 05/15/2019 17:15   Dg Chest Port 1 View  Result Date: 05/14/2019 CLINICAL DATA:  Central line placement EXAM: PORTABLE CHEST 1 VIEW COMPARISON:  Yesterday FINDINGS: New right IJ line with tip at the SVC.  No pneumothorax. Cardiomegaly distorted by rightward rotation. There is a new infiltrate at the left base. Remote nonunited right mid clavicle fracture. IMPRESSION: 1. New central line without complicating feature. 2. Probable left lower lobe pneumonia or aspiration. Electronically Signed   By: Monte Fantasia M.D.   On: 05/14/2019 06:45   Dg Chest Port 1 View  Result Date: 06/07/2019 CLINICAL DATA:  Shortness of breath EXAM: PORTABLE CHEST 1 VIEW COMPARISON:  12/13/2017 FINDINGS: Evaluation is limited by patient positioning. The cardiac silhouette is enlarged. There is a left basilar airspace opacity. No pneumothorax. An old displaced right clavicle fracture is noted. There are advanced degenerative changes of the right glenohumeral joint. A background of emphysematous changes is suspected. IMPRESSION: 1. Cardiomegaly. 2. Persistent but improved opacity at the left lung base favored to represent chronic atelectasis or scarring. There may be a small underlying pleural effusion. Electronically Signed   By: Constance Holster M.D.   On: 05/24/2019 22:32    Microbiology: Recent Results (from the past 240 hour(s))    SARS Coronavirus 2 (CEPHEID - Performed in [place] hospital lab), Hosp Order     Status: None   Collection Time: 06/06/2019 10:29 PM  Result Value Ref Range Status   SARS Coronavirus 2 NEGATIVE NEGATIVE Final    Comment: (NOTE) If result is NEGATIVE SARS-CoV-2 target nucleic acids are NOT DETECTED. The SARS-CoV-2 RNA is generally detectable in upper and lower  respiratory specimens during the acute phase of infection. The lowest  concentration of SARS-CoV-2 viral copies this assay can detect is 250  copies / mL. A negative result does not preclude SARS-CoV-2 infection  and should not be used as the sole basis for treatment or other  patient management decisions.  A negative result may occur with  improper specimen collection / handling, submission of specimen other  than nasopharyngeal swab, presence of viral mutation(s) within the  areas targeted by this assay, and inadequate number of viral copies  (<250 copies / mL). A negative result must be combined with clinical  observations, patient history, and epidemiological information. If result is POSITIVE SARS-CoV-2 target nucleic acids are DETECTED. The SARS-CoV-2 RNA is generally detectable in upper and lower  respiratory specimens dur ing the acute phase of infection.  Positive  results are indicative of active infection with SARS-CoV-2.  Clinical  correlation with patient history and other diagnostic information is  necessary to determine patient infection status.  Positive results do  not rule out bacterial infection or co-infection with other viruses. If result is PRESUMPTIVE POSTIVE SARS-CoV-2 nucleic acids MAY BE PRESENT.   A presumptive positive result was obtained on the submitted specimen  and confirmed on repeat testing.  While 2019 novel coronavirus  (SARS-CoV-2) nucleic acids may be present in  the submitted sample  additional confirmatory testing may be necessary for epidemiological  and / or clinical management  purposes  to differentiate between  SARS-CoV-2 and other Sarbecovirus currently known to infect humans.  If clinically indicated additional testing with an alternate test  methodology [phone]) is advised. The SARS-CoV-2 RNA is generally  detectable in upper and lower respiratory sp ecimens during the acute  phase of infection. The expected result is Negative. Fact Sheet for Patients:  StrictlyIdeas.no Fact Sheet for Healthcare Providers: BankingDealers.co.za This test is not yet approved or cleared by the [place] FDA and has been authorized for detection and/or diagnosis of SARS-CoV-2 by FDA under an Emergency Use Authorization (EUA).  This EUA will remain in effect (meaning this test can be used) for the duration of the COVID-19 declaration under Section 564(b)(1) of the Act, 21 U.S.C. section 360bbb-3(b)(1), unless the authorization is terminated or revoked sooner. Performed at [facility], [place] [address], [place], [place] [address]      Labs: Basic Metabolic Panel: Recent Labs  Lab 05/12/2019 2202 06/02/2019 2216 05/14/19 0617 05/15/19 0433 31-May-2019 0348  NA 137 135 138 138 136  K 3.7 3.6 3.3* 2.9* 4.7  CL 101 103 105 106 106  CO2 18*  --  19* 18* 14*  GLUCOSE 134* 131* 82 113* 134*  BUN 81* 77* 75* 60* 54*  CREATININE 3.41* 3.50* 3.27* 2.89* 3.10*  CALCIUM 9.4  --  8.6* 8.1* 8.3*  MG  --   --   --  2.0  --    Liver Function Tests: Recent Labs  Lab 06/04/2019 2202 05/15/19 0433  AST 33 28  ALT 129* 87*  ALKPHOS 93 74  BILITOT 1.1 0.7  PROT 7.0 6.3*  ALBUMIN 3.5 2.9*   Recent Labs  Lab 05/20/2019 2202  LIPASE 43   No results for input(s): AMMONIA in the last 168 hours. CBC: Recent Labs  Lab 05/20/2019 2202 05/14/2019 2216 05/14/19 0617 05/15/19 0433 May 31, 2019 0348  WBC 9.2  --  7.1 6.4 8.6  NEUTROABS 7.7  --   --   --   --   HGB 11.8* 12.6 10.4* 10.7* 11.1*  HCT 37.3 37.0 32.6* 33.4* 35.4*  MCV  95.2  --  93.7 95.2 96.5  PLT 219  --  182 172 168   Cardiac Enzymes: No results for input(s): CKTOTAL, CKMB, CKMBINDEX, TROPONINI in the last 168 hours. D-Dimer No results for input(s): DDIMER in the last 72 hours. BNP: Invalid input(s): POCBNP CBG: Recent Labs  Lab 05/14/19 1653 05/15/19 0828 05/15/19 1206 05/15/19 1547 05-31-2019 0804  GLUCAP 121* 110* 145* 150* 87   Anemia work up No results for input(s): VITAMINB12, FOLATE, FERRITIN, TIBC, IRON, RETICCTPCT in the last 72 hours. Urinalysis    Component Value Date/Time   COLORURINE YELLOW 11/26/2018 2237   APPEARANCEUR CLEAR 11/26/2018 2237   LABSPEC 1.010 11/26/2018 2237   PHURINE 7.0 11/26/2018 2237   GLUCOSEU NEGATIVE 11/26/2018 2237   HGBUR MODERATE (A) 11/26/2018 2237   BILIRUBINUR neg 05/04/2019 [place] 11/26/2018 2237   PROTEINUR Negative 05/04/2019 1234   PROTEINUR NEGATIVE 11/26/2018 2237   UROBILINOGEN 0.2 05/04/2019 1234   NITRITE neg 05/04/2019 1234   NITRITE NEGATIVE 11/26/2018 2237   LEUKOCYTESUR Negative 05/04/2019 1234   Sepsis Labs Invalid input(s): PROCALCITONIN,  WBC,  LACTICIDVEN    SIGNED:  Marylu Lund, MD  [facility] 05-31-2019, 11:39 AM  If 7PM-7AM, please contact night-coverage [email or web address] Password [email or web address]

## 2019-06-09 DEATH — deceased

## 2020-12-18 IMAGING — CT CT ABD-PELV W/ CM
2 of 5 series · 16 of 46 positions shown, 18 images · IV contrast (omnipaque)
Comparison: Lumbar spine x-rays dated January 21, 2017.

CLINICAL DATA: Fall.  Confusion.

EXAM:
CT ABDOMEN AND PELVIS WITH CONTRAST
TECHNIQUE: Multidetector CT imaging of the abdomen and pelvis was performed
using the standard protocol following bolus administration of
intravenous contrast.
CONTRAST:  75mL OMNIPAQUE IOHEXOL 300 MG/ML  SOLN

[Series 3: abd/ pelvis 5.0 i30f 2 · axial · 0.87mm/px · z∈[+711,+1086]mm · 13 of 85 slices shown, 15 images]
[im 5/85  soft-tissue]
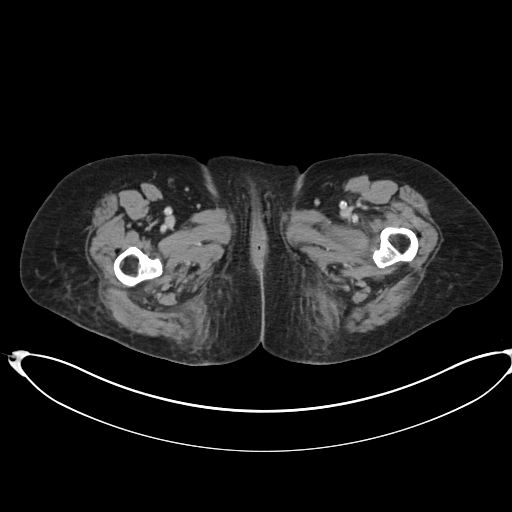
[im 5/85  bone]
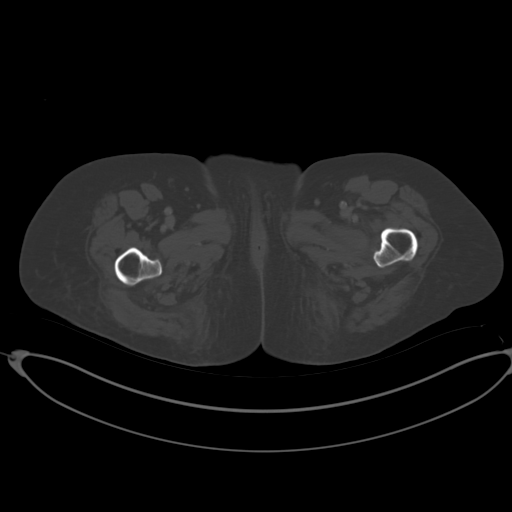
[im 13/85  soft-tissue]
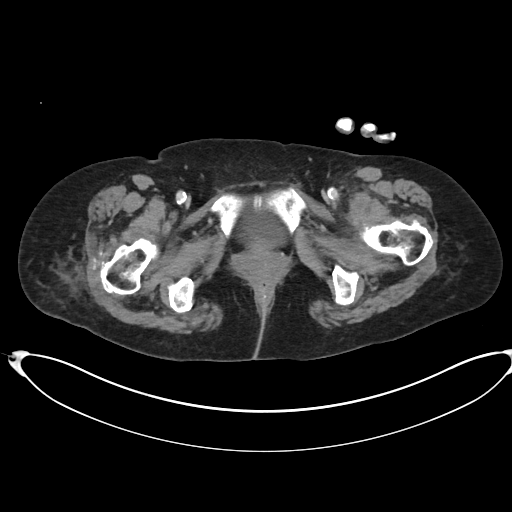
[im 17/85  soft-tissue]
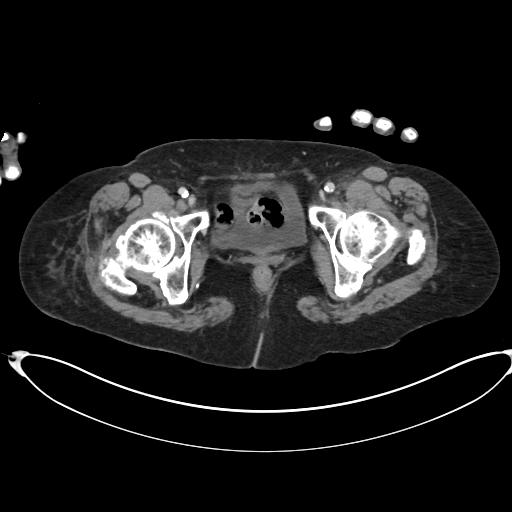
[im 26/85  soft-tissue]
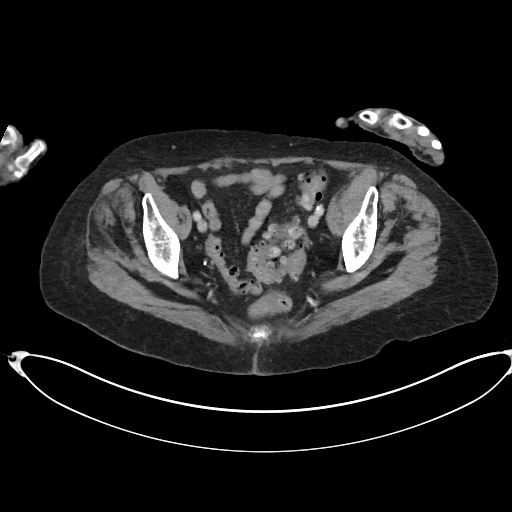
[im 30/85  soft-tissue]
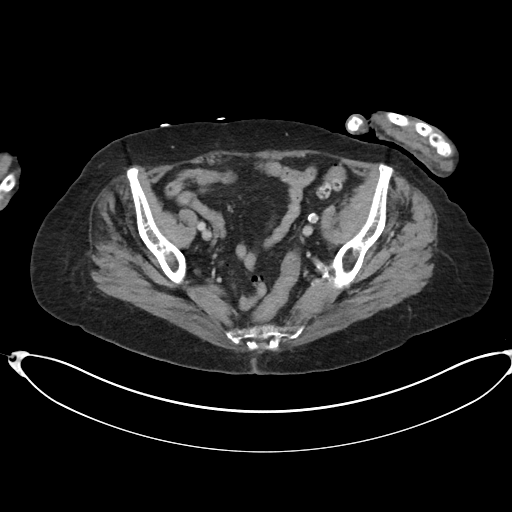
[im 38/85  soft-tissue]
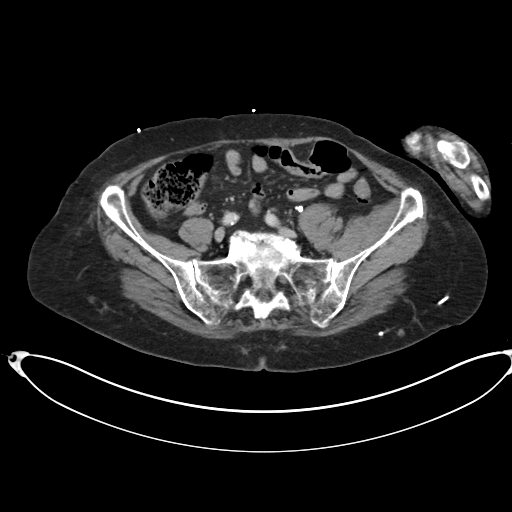
[im 43/85  soft-tissue]
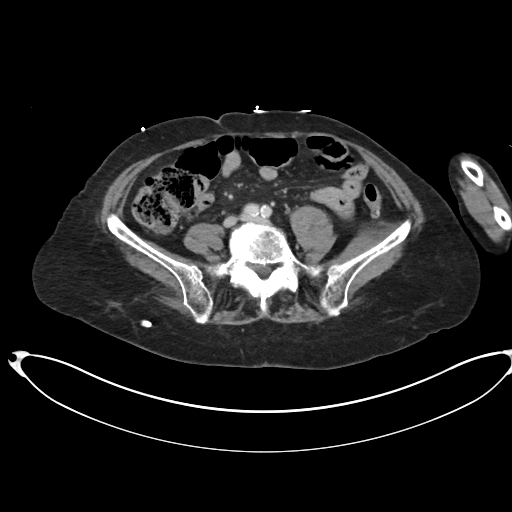
[im 47/85  soft-tissue]
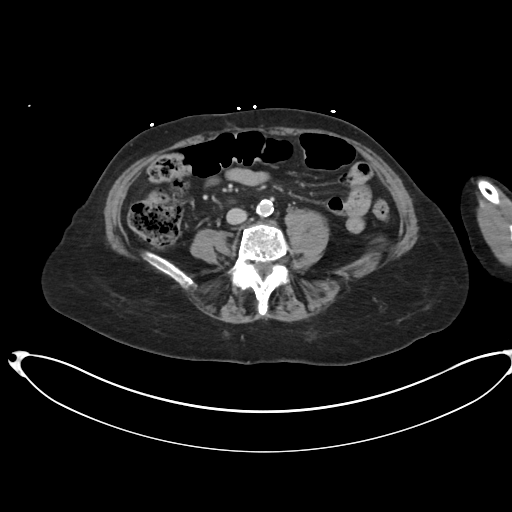
[im 55/85  soft-tissue]
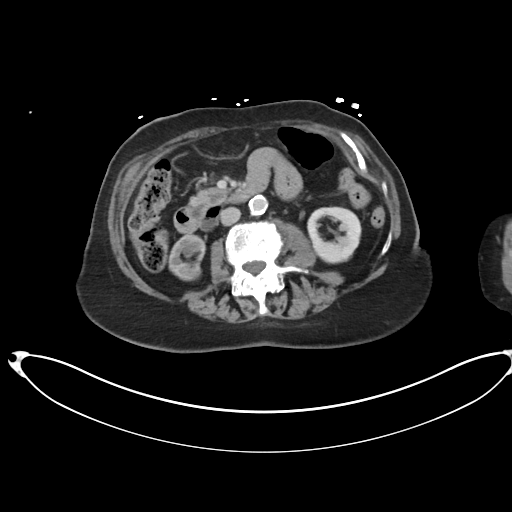
[im 55/85  bone]
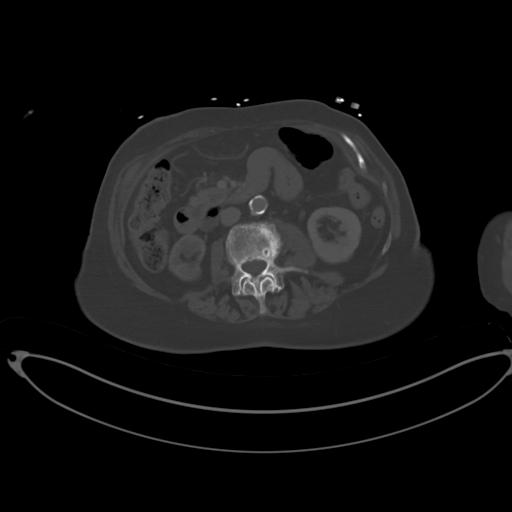
[im 59/85  soft-tissue]
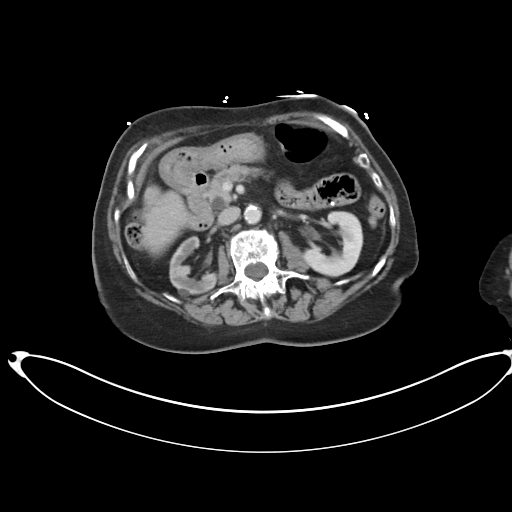
[im 68/85  soft-tissue]
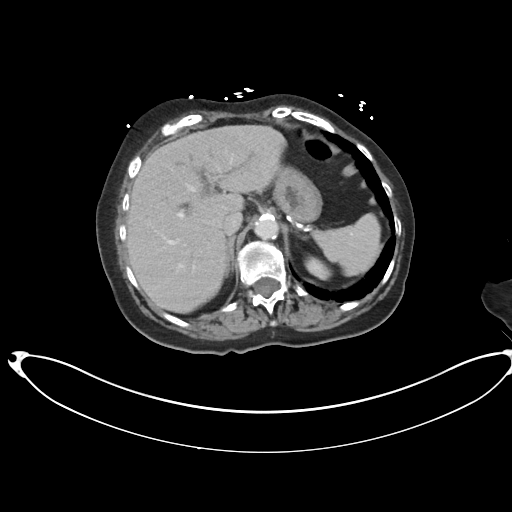
[im 72/85  soft-tissue]
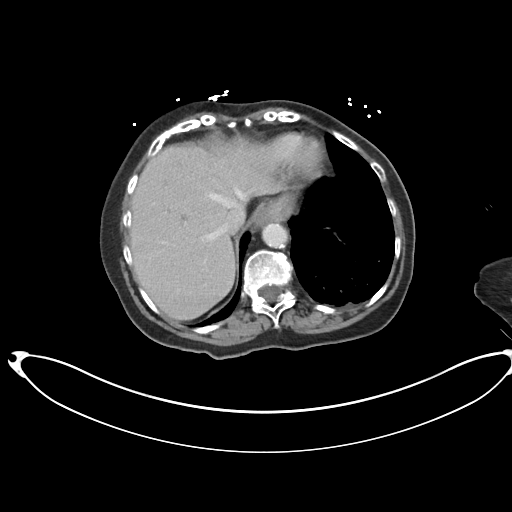
[im 80/85  soft-tissue]
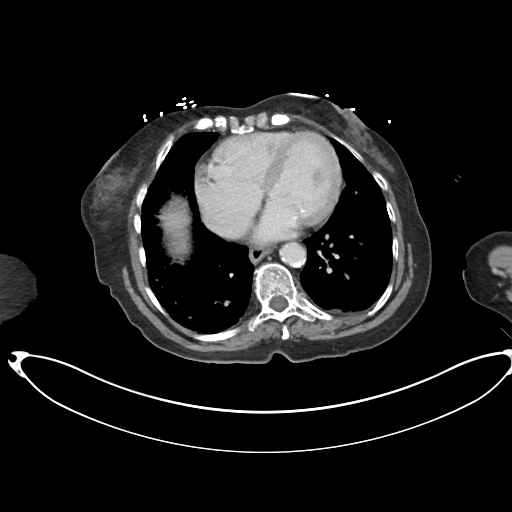

[Series 6: coronal soft tissue · coronal · 0.83mm/px · 3 of 97 slices shown]
[im 33/97  soft-tissue]
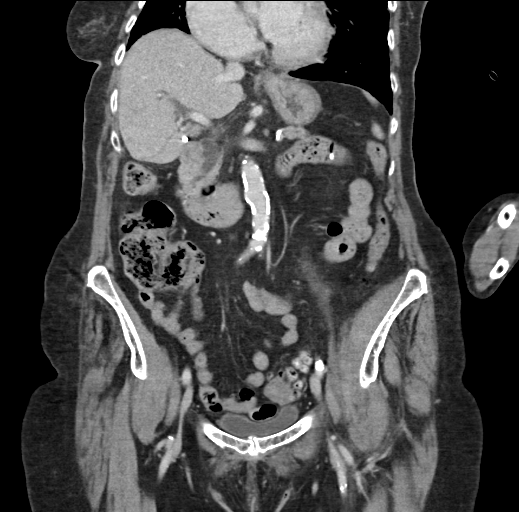
[im 43/97  soft-tissue]
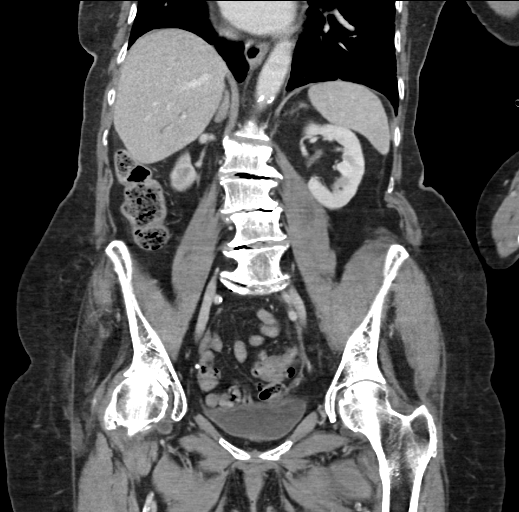
[im 54/97  soft-tissue]
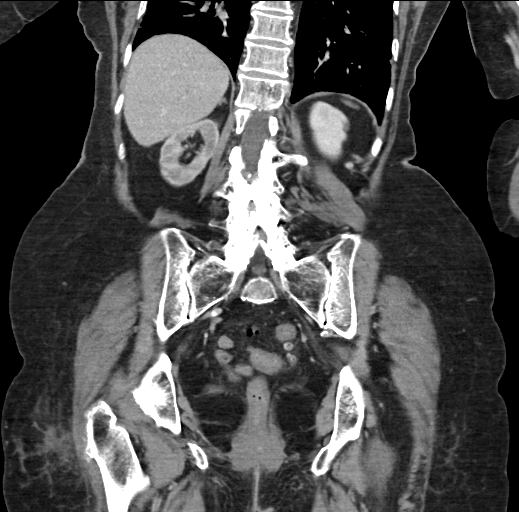

[16 of 46 positions shown; findings below may reference images not displayed]

FINDINGS: Lower chest: Cardiomegaly. Bibasilar subsegmental atelectasis and
mild interlobular septal thickening. Trace left pleural effusion.

Hepatobiliary: Tiny calcified granulomas in the right hepatic lobe.
Status post cholecystectomy. Mild intra and extrahepatic biliary
dilatation, likely related to post cholecystectomy state.

Pancreas: Mild main pancreatic duct dilatation, measuring 4 mm. No
mass or surrounding inflammatory changes.

Spleen: Normal in size without focal abnormality.

Adrenals/Urinary Tract: The right adrenal gland is unremarkable.
Mild left adrenal thickening without focal nodule. Moderately
atrophic right kidney delayed enhancement and excretion of contrast.
Subcentimeter low-density lesions in both kidneys are too small to
characterize. No renal or ureteral calculi. No hydronephrosis.
Bladder is unremarkable.

Stomach/Bowel: Small hiatal hernia. The stomach is otherwise within
normal limits. No bowel wall thickening, distention, or surrounding
inflammatory changes. Extensive distal descending and sigmoid
colonic diverticulosis. The appendix is not identified, but there
are no signs of inflammation at the base of the cecum.

Vascular/Lymphatic: Aortoiliac atherosclerotic vascular disease. No
aneurysm. Severe stenosis of the right renal artery origin. No
enlarged abdominal or pelvic lymph nodes.

Reproductive: Status post hysterectomy. No adnexal masses.

Other: No abdominal wall hernia or abnormality. No abdominopelvic
ascites. No pneumoperitoneum.

Musculoskeletal: Age-indeterminate mild L2 superior endplate
compression deformity, new since January 2017. Moderate to severe
lumbar spondylosis. Moderate to severe right and mild left hip
osteoarthritis.
IMPRESSION: 1.  No acute intra-abdominal process.
2. Age-indeterminate mild L2 superior endplate compression
deformity, new since January 2017. Correlate with point tenderness.
3. Mild interstitial pulmonary edema.  Trace left pleural effusion.
4. Moderate right renal atrophy likely related to longstanding
hypoperfusion given severe right renal artery origin stenosis.
5.  Aortic atherosclerosis ([id number]).

## 2020-12-18 IMAGING — CR DG HIP (WITH OR WITHOUT PELVIS) 2-3V*L*
2 series · 2 of 2 positions shown · non-contrast
Comparison: No prior.

CLINICAL DATA: Left hip pain.

EXAM:
DG HIP (WITH OR WITHOUT PELVIS) 2-3V LEFT

[t hip ap left]
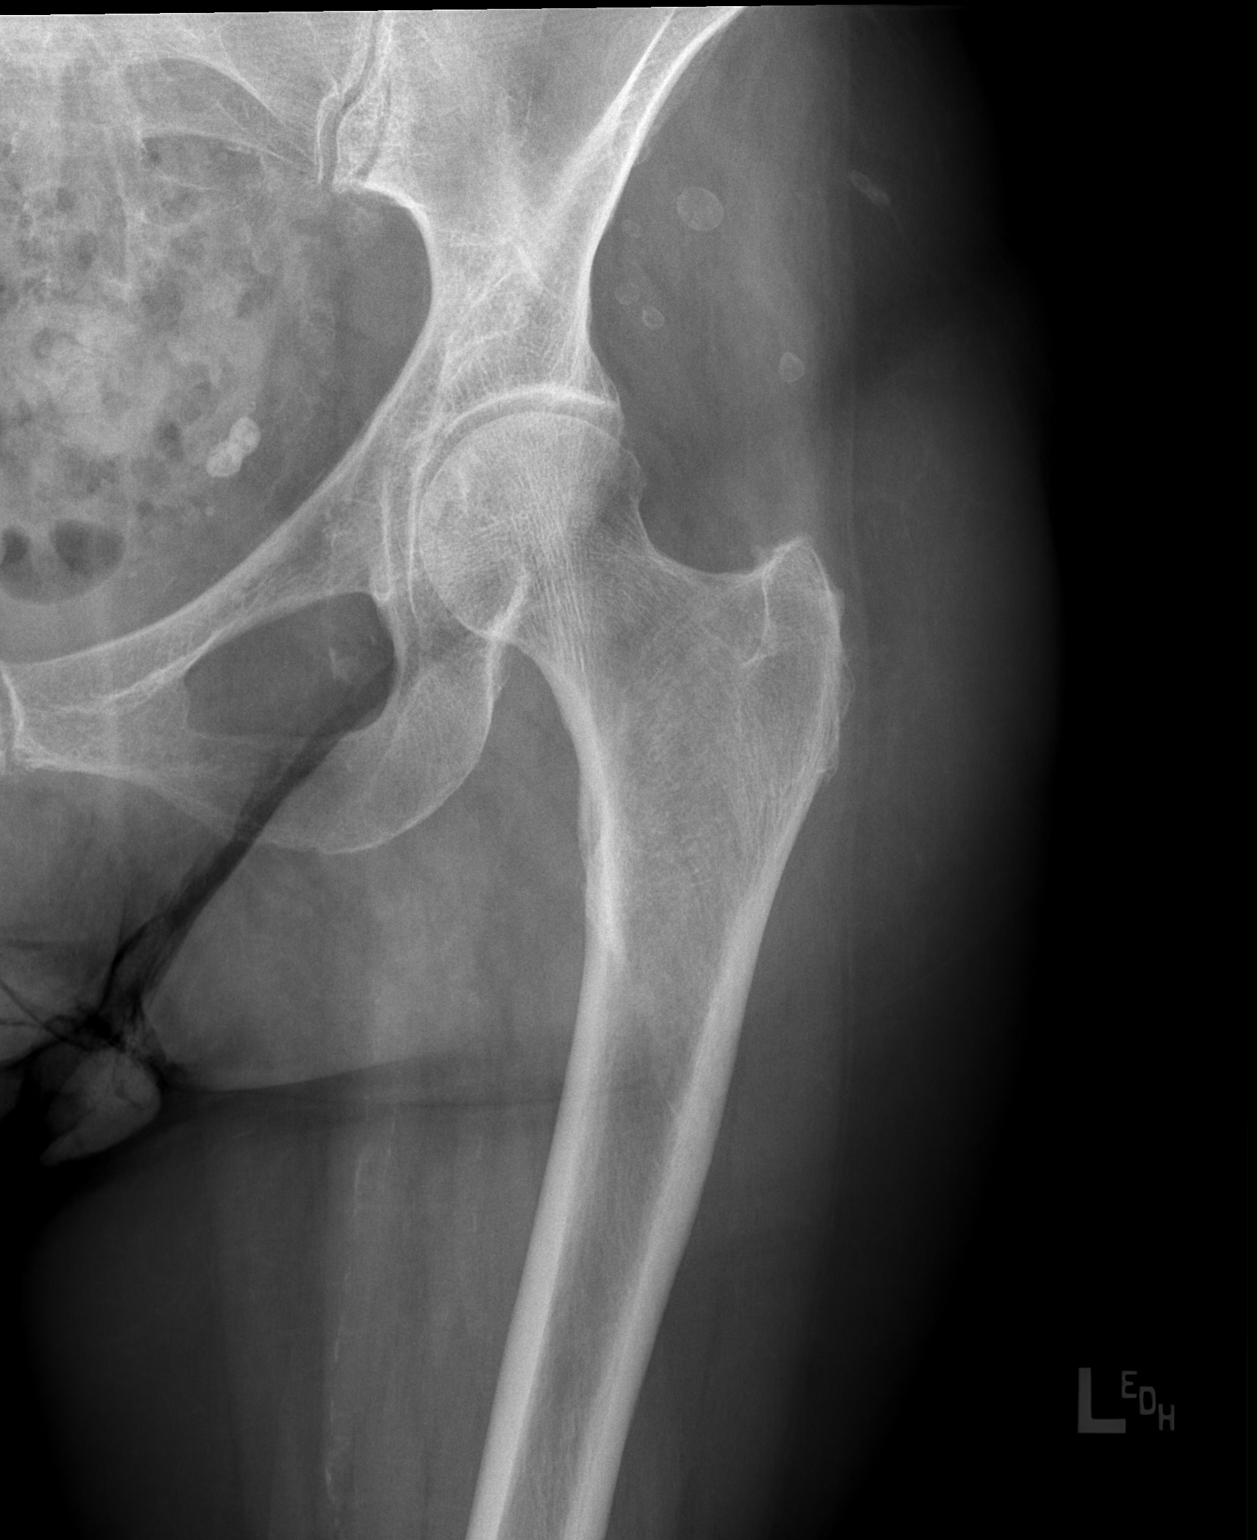

[t hip frog leg left]
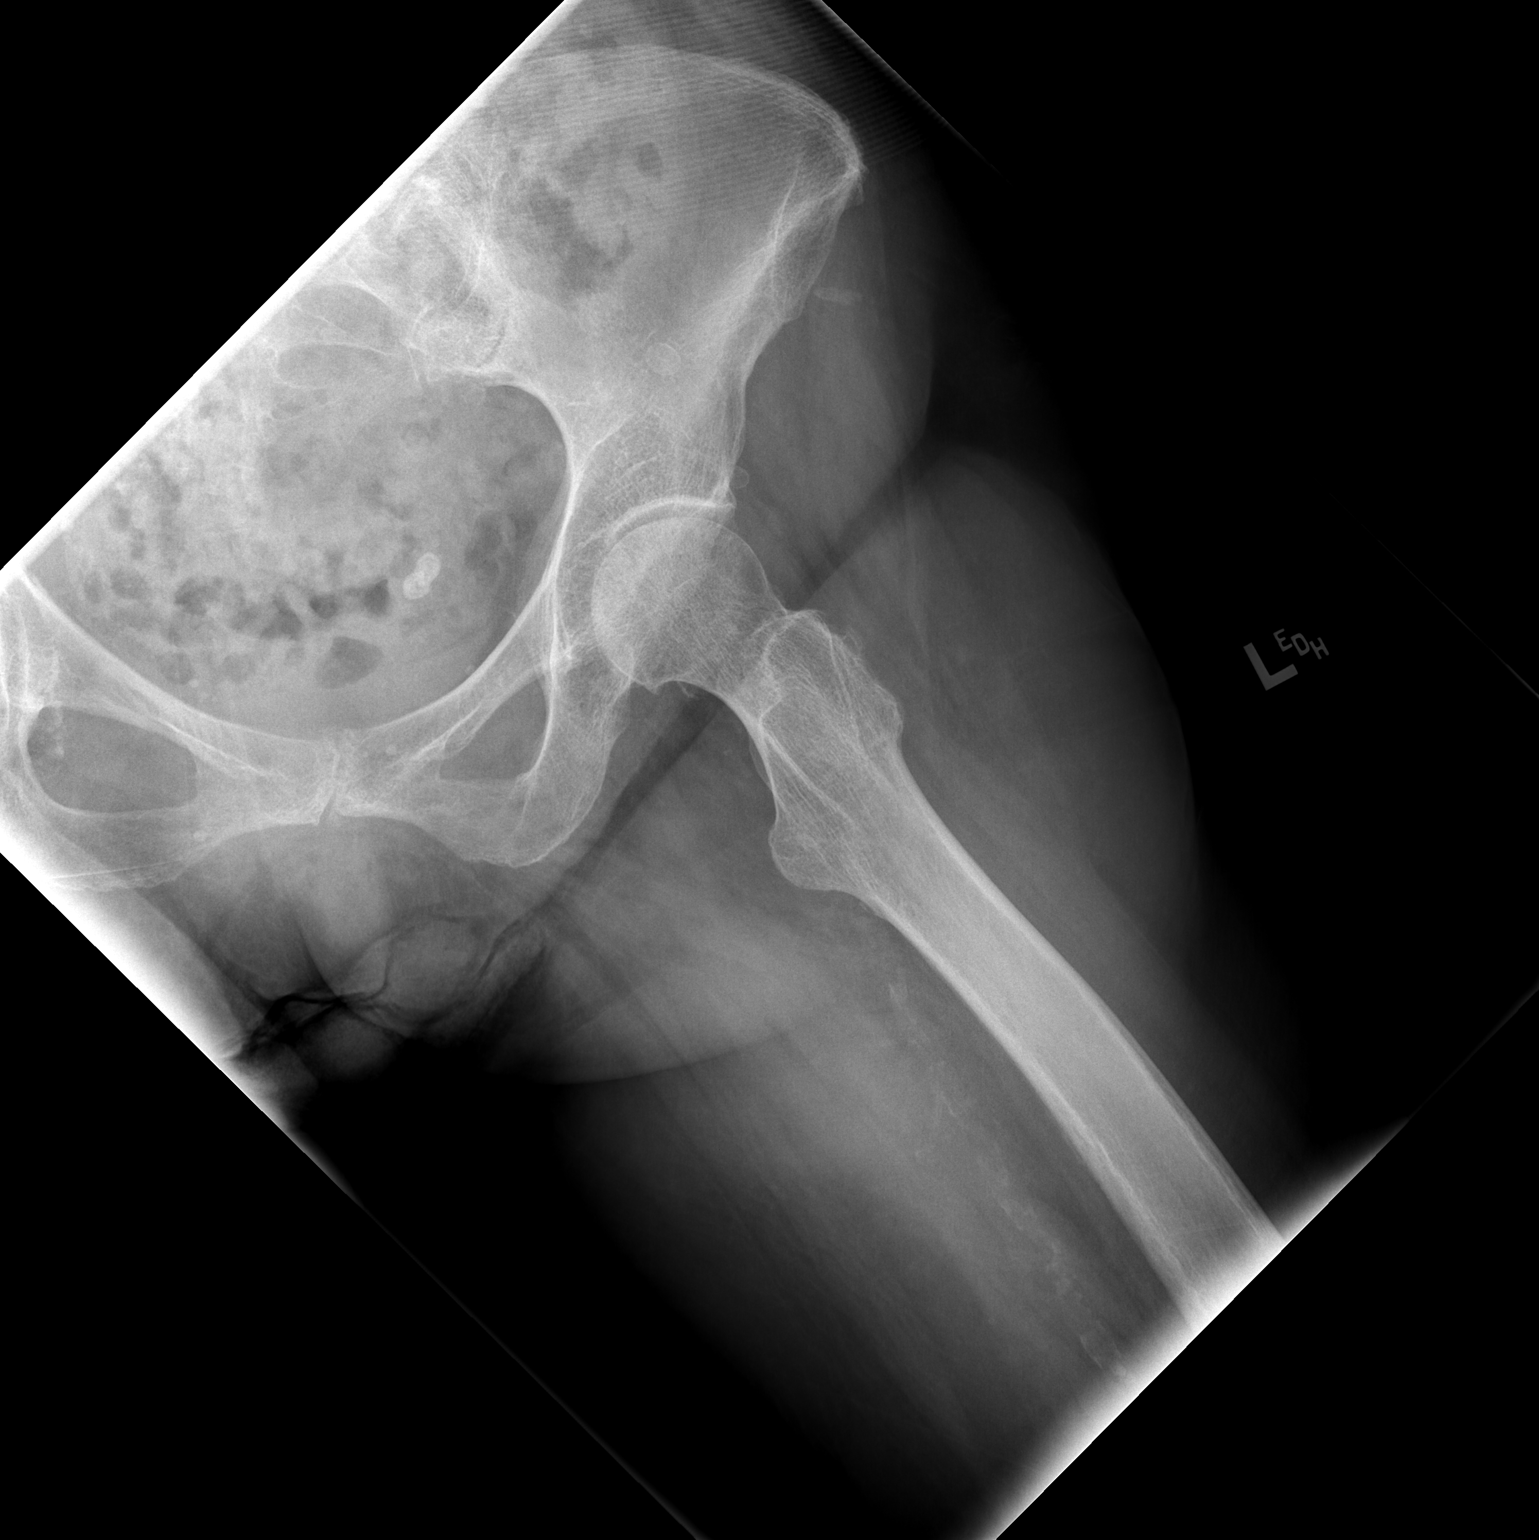

[2 of 2 positions shown; findings below may reference images not displayed]

FINDINGS: Pelvic calcifications consistent phleboliths. Calcifications noted
the buttock consistent with injection granulomas. No acute bony
abnormality identified. No evidence of fracture or dislocation.
Peripheral vascular calcification.
IMPRESSION: 1.  Degenerative changes left hip.

2.  No acute bony or joint abnormality.

3.  Peripheral vascular disease.
